# Patient Record
Sex: Female | Born: 1950 | Race: Black or African American | Hispanic: No | Marital: Married | Smoking: Former smoker
Health system: Southern US, Community
[De-identification: ages and names within clinical notes are randomized; demographics above are authoritative.]

## PROBLEM LIST (undated history)

## (undated) DIAGNOSIS — M797 Fibromyalgia: Secondary | ICD-10-CM

## (undated) DIAGNOSIS — E785 Hyperlipidemia, unspecified: Secondary | ICD-10-CM

## (undated) DIAGNOSIS — J449 Chronic obstructive pulmonary disease, unspecified: Secondary | ICD-10-CM

## (undated) DIAGNOSIS — F609 Personality disorder, unspecified: Secondary | ICD-10-CM

## (undated) DIAGNOSIS — Z86010 Personal history of colonic polyps: Secondary | ICD-10-CM

## (undated) DIAGNOSIS — M359 Systemic involvement of connective tissue, unspecified: Secondary | ICD-10-CM

## (undated) DIAGNOSIS — F445 Conversion disorder with seizures or convulsions: Secondary | ICD-10-CM

## (undated) DIAGNOSIS — M069 Rheumatoid arthritis, unspecified: Secondary | ICD-10-CM

## (undated) DIAGNOSIS — G8929 Other chronic pain: Secondary | ICD-10-CM

## (undated) DIAGNOSIS — J45909 Unspecified asthma, uncomplicated: Secondary | ICD-10-CM

## (undated) DIAGNOSIS — I10 Essential (primary) hypertension: Secondary | ICD-10-CM

## (undated) DIAGNOSIS — F32A Depression, unspecified: Secondary | ICD-10-CM

## (undated) DIAGNOSIS — I639 Cerebral infarction, unspecified: Secondary | ICD-10-CM

## (undated) DIAGNOSIS — M545 Low back pain, unspecified: Secondary | ICD-10-CM

## (undated) DIAGNOSIS — G4730 Sleep apnea, unspecified: Secondary | ICD-10-CM

## (undated) DIAGNOSIS — R569 Unspecified convulsions: Secondary | ICD-10-CM

## (undated) DIAGNOSIS — Z8673 Personal history of transient ischemic attack (TIA), and cerebral infarction without residual deficits: Secondary | ICD-10-CM

## (undated) DIAGNOSIS — F329 Major depressive disorder, single episode, unspecified: Secondary | ICD-10-CM

## (undated) DIAGNOSIS — K219 Gastro-esophageal reflux disease without esophagitis: Secondary | ICD-10-CM

## (undated) HISTORY — DX: Sleep apnea, unspecified: G47.30

## (undated) HISTORY — DX: Essential (primary) hypertension: I10

## (undated) HISTORY — PX: APPENDECTOMY: SHX54

## (undated) HISTORY — DX: Other chronic pain: G89.29

## (undated) HISTORY — DX: Conversion disorder with seizures or convulsions: F44.5

## (undated) HISTORY — PX: NOSE SURGERY: SHX723

## (undated) HISTORY — DX: Unspecified convulsions: R56.9

## (undated) HISTORY — DX: Depression, unspecified: F32.A

## (undated) HISTORY — PX: CHOLECYSTECTOMY: SHX55

## (undated) HISTORY — DX: Major depressive disorder, single episode, unspecified: F32.9

## (undated) HISTORY — DX: Fibromyalgia: M79.7

## (undated) HISTORY — PX: FLANK MASS EXCISION: SUR536

## (undated) HISTORY — DX: Low back pain, unspecified: M54.50

## (undated) HISTORY — DX: Chronic obstructive pulmonary disease, unspecified: J44.9

## (undated) HISTORY — DX: Low back pain: M54.5

## (undated) HISTORY — DX: Personal history of transient ischemic attack (TIA), and cerebral infarction without residual deficits: Z86.73

## (undated) HISTORY — PX: BUNIONECTOMY: SHX129

## (undated) HISTORY — PX: TOTAL ABDOMINAL HYSTERECTOMY: SHX209

## (undated) HISTORY — DX: Gastro-esophageal reflux disease without esophagitis: K21.9

## (undated) HISTORY — DX: Rheumatoid arthritis, unspecified: M06.9

## (undated) HISTORY — PX: CYST EXCISION: SHX5701

## (undated) HISTORY — DX: Personal history of colonic polyps: Z86.010

## (undated) HISTORY — PX: BACK SURGERY: SHX140

## (undated) HISTORY — DX: Personality disorder, unspecified: F60.9

## (undated) HISTORY — PX: ANKLE SURGERY: SHX546

## (undated) HISTORY — PX: SHOULDER SURGERY: SHX246

## (undated) HISTORY — PX: KNEE SURGERY: SHX244

## (undated) HISTORY — DX: Hyperlipidemia, unspecified: E78.5

---

## 1998-01-27 ENCOUNTER — Ambulatory Visit (HOSPITAL_COMMUNITY): Admission: RE | Admit: 1998-01-27 | Discharge: 1998-01-27 | Payer: Self-pay | Admitting: *Deleted

## 1998-04-17 ENCOUNTER — Emergency Department (HOSPITAL_COMMUNITY): Admission: EM | Admit: 1998-04-17 | Discharge: 1998-04-17 | Payer: Self-pay | Admitting: Emergency Medicine

## 1998-05-17 ENCOUNTER — Encounter: Admission: RE | Admit: 1998-05-17 | Discharge: 1998-05-17 | Payer: Self-pay | Admitting: Sports Medicine

## 1998-05-23 ENCOUNTER — Ambulatory Visit: Admission: RE | Admit: 1998-05-23 | Discharge: 1998-05-23 | Payer: Self-pay | Admitting: *Deleted

## 1998-07-20 ENCOUNTER — Emergency Department (HOSPITAL_COMMUNITY): Admission: EM | Admit: 1998-07-20 | Discharge: 1998-07-20 | Payer: Self-pay | Admitting: Emergency Medicine

## 1998-07-26 ENCOUNTER — Encounter: Admission: RE | Admit: 1998-07-26 | Discharge: 1998-07-26 | Payer: Self-pay | Admitting: Family Medicine

## 1998-07-27 ENCOUNTER — Encounter: Admission: RE | Admit: 1998-07-27 | Discharge: 1998-07-27 | Payer: Self-pay | Admitting: Family Medicine

## 1998-07-27 ENCOUNTER — Other Ambulatory Visit: Admission: RE | Admit: 1998-07-27 | Discharge: 1998-07-27 | Payer: Self-pay | Admitting: *Deleted

## 1998-10-10 ENCOUNTER — Encounter: Payer: Self-pay | Admitting: Neurosurgery

## 1998-10-10 ENCOUNTER — Ambulatory Visit (HOSPITAL_COMMUNITY): Admission: RE | Admit: 1998-10-10 | Discharge: 1998-10-10 | Payer: Self-pay | Admitting: Neurosurgery

## 1998-12-12 ENCOUNTER — Ambulatory Visit (HOSPITAL_COMMUNITY): Admission: RE | Admit: 1998-12-12 | Discharge: 1998-12-12 | Payer: Self-pay | Admitting: Neurosurgery

## 1998-12-12 ENCOUNTER — Encounter: Payer: Self-pay | Admitting: Neurosurgery

## 1999-01-03 ENCOUNTER — Encounter: Admission: RE | Admit: 1999-01-03 | Discharge: 1999-01-03 | Payer: Self-pay | Admitting: Sports Medicine

## 1999-01-16 ENCOUNTER — Encounter: Payer: Self-pay | Admitting: Neurosurgery

## 1999-01-17 ENCOUNTER — Ambulatory Visit (HOSPITAL_COMMUNITY): Admission: RE | Admit: 1999-01-17 | Discharge: 1999-01-17 | Payer: Self-pay | Admitting: Neurosurgery

## 1999-01-17 ENCOUNTER — Encounter: Payer: Self-pay | Admitting: Neurosurgery

## 1999-01-18 ENCOUNTER — Encounter: Payer: Self-pay | Admitting: Neurosurgery

## 1999-01-18 ENCOUNTER — Inpatient Hospital Stay (HOSPITAL_COMMUNITY): Admission: RE | Admit: 1999-01-18 | Discharge: 1999-01-19 | Payer: Self-pay | Admitting: Neurosurgery

## 1999-01-24 ENCOUNTER — Inpatient Hospital Stay (HOSPITAL_COMMUNITY): Admission: EM | Admit: 1999-01-24 | Discharge: 1999-01-29 | Payer: Self-pay | Admitting: Emergency Medicine

## 1999-01-24 ENCOUNTER — Encounter: Payer: Self-pay | Admitting: Emergency Medicine

## 1999-02-22 ENCOUNTER — Encounter: Admission: RE | Admit: 1999-02-22 | Discharge: 1999-02-22 | Payer: Self-pay | Admitting: Family Medicine

## 1999-03-15 ENCOUNTER — Encounter: Admission: RE | Admit: 1999-03-15 | Discharge: 1999-03-15 | Payer: Self-pay | Admitting: Family Medicine

## 1999-04-28 ENCOUNTER — Ambulatory Visit (HOSPITAL_COMMUNITY): Admission: RE | Admit: 1999-04-28 | Discharge: 1999-04-28 | Payer: Self-pay | Admitting: Gastroenterology

## 1999-04-28 ENCOUNTER — Encounter: Payer: Self-pay | Admitting: Gastroenterology

## 1999-05-18 ENCOUNTER — Encounter: Payer: Self-pay | Admitting: Gastroenterology

## 1999-05-18 ENCOUNTER — Ambulatory Visit (HOSPITAL_COMMUNITY): Admission: RE | Admit: 1999-05-18 | Discharge: 1999-05-18 | Payer: Self-pay | Admitting: Gastroenterology

## 1999-05-25 ENCOUNTER — Encounter: Admission: RE | Admit: 1999-05-25 | Discharge: 1999-05-25 | Payer: Self-pay | Admitting: Family Medicine

## 1999-06-22 ENCOUNTER — Encounter: Admission: RE | Admit: 1999-06-22 | Discharge: 1999-06-22 | Payer: Self-pay | Admitting: Family Medicine

## 1999-07-03 ENCOUNTER — Ambulatory Visit (HOSPITAL_COMMUNITY): Admission: RE | Admit: 1999-07-03 | Discharge: 1999-07-03 | Payer: Self-pay | Admitting: Gastroenterology

## 1999-08-02 ENCOUNTER — Emergency Department (HOSPITAL_COMMUNITY): Admission: EM | Admit: 1999-08-02 | Discharge: 1999-08-02 | Payer: Self-pay | Admitting: Emergency Medicine

## 1999-08-02 ENCOUNTER — Encounter: Payer: Self-pay | Admitting: Emergency Medicine

## 1999-09-20 ENCOUNTER — Encounter: Admission: RE | Admit: 1999-09-20 | Discharge: 1999-09-20 | Payer: Self-pay | Admitting: Family Medicine

## 1999-10-04 ENCOUNTER — Encounter: Admission: RE | Admit: 1999-10-04 | Discharge: 1999-10-04 | Payer: Self-pay | Admitting: Family Medicine

## 1999-11-02 ENCOUNTER — Other Ambulatory Visit: Admission: RE | Admit: 1999-11-02 | Discharge: 1999-11-02 | Payer: Self-pay | Admitting: Family Medicine

## 1999-11-02 ENCOUNTER — Encounter: Admission: RE | Admit: 1999-11-02 | Discharge: 1999-11-02 | Payer: Self-pay | Admitting: Family Medicine

## 1999-11-06 ENCOUNTER — Encounter: Admission: RE | Admit: 1999-11-06 | Discharge: 1999-11-06 | Payer: Self-pay | Admitting: Family Medicine

## 1999-11-07 ENCOUNTER — Encounter: Admission: RE | Admit: 1999-11-07 | Discharge: 1999-11-07 | Payer: Self-pay | Admitting: Family Medicine

## 1999-11-07 ENCOUNTER — Encounter: Payer: Self-pay | Admitting: Family Medicine

## 1999-11-09 ENCOUNTER — Encounter: Admission: RE | Admit: 1999-11-09 | Discharge: 1999-11-09 | Payer: Self-pay | Admitting: Family Medicine

## 1999-11-09 ENCOUNTER — Encounter: Payer: Self-pay | Admitting: Family Medicine

## 1999-11-09 ENCOUNTER — Emergency Department (HOSPITAL_COMMUNITY): Admission: EM | Admit: 1999-11-09 | Discharge: 1999-11-09 | Payer: Self-pay | Admitting: Emergency Medicine

## 1999-11-09 ENCOUNTER — Ambulatory Visit (HOSPITAL_COMMUNITY): Admission: RE | Admit: 1999-11-09 | Discharge: 1999-11-09 | Payer: Self-pay | Admitting: Family Medicine

## 1999-11-14 ENCOUNTER — Encounter: Admission: RE | Admit: 1999-11-14 | Discharge: 1999-11-14 | Payer: Self-pay | Admitting: Sports Medicine

## 1999-11-21 ENCOUNTER — Ambulatory Visit (HOSPITAL_COMMUNITY): Admission: RE | Admit: 1999-11-21 | Discharge: 1999-11-21 | Payer: Self-pay

## 1999-11-30 ENCOUNTER — Encounter: Payer: Self-pay | Admitting: Gastroenterology

## 1999-11-30 ENCOUNTER — Ambulatory Visit (HOSPITAL_COMMUNITY): Admission: RE | Admit: 1999-11-30 | Discharge: 1999-11-30 | Payer: Self-pay | Admitting: Gastroenterology

## 2000-01-18 ENCOUNTER — Encounter: Payer: Self-pay | Admitting: Emergency Medicine

## 2000-01-18 ENCOUNTER — Emergency Department (HOSPITAL_COMMUNITY): Admission: EM | Admit: 2000-01-18 | Discharge: 2000-01-18 | Payer: Self-pay | Admitting: Emergency Medicine

## 2000-01-24 ENCOUNTER — Encounter: Admission: RE | Admit: 2000-01-24 | Discharge: 2000-01-24 | Payer: Self-pay | Admitting: Family Medicine

## 2000-05-29 ENCOUNTER — Encounter: Admission: RE | Admit: 2000-05-29 | Discharge: 2000-05-29 | Payer: Self-pay | Admitting: Family Medicine

## 2000-05-30 ENCOUNTER — Encounter: Admission: RE | Admit: 2000-05-30 | Discharge: 2000-05-30 | Payer: Self-pay | Admitting: Family Medicine

## 2000-08-20 ENCOUNTER — Encounter: Admission: RE | Admit: 2000-08-20 | Discharge: 2000-08-20 | Payer: Self-pay | Admitting: Family Medicine

## 2000-08-30 ENCOUNTER — Encounter: Admission: RE | Admit: 2000-08-30 | Discharge: 2000-08-30 | Payer: Self-pay | Admitting: Family Medicine

## 2000-09-08 ENCOUNTER — Emergency Department (HOSPITAL_COMMUNITY): Admission: EM | Admit: 2000-09-08 | Discharge: 2000-09-09 | Payer: Self-pay | Admitting: Emergency Medicine

## 2000-09-09 ENCOUNTER — Encounter: Payer: Self-pay | Admitting: Emergency Medicine

## 2000-09-23 ENCOUNTER — Encounter: Admission: RE | Admit: 2000-09-23 | Discharge: 2000-09-23 | Payer: Self-pay | Admitting: Family Medicine

## 2000-09-23 ENCOUNTER — Other Ambulatory Visit: Admission: RE | Admit: 2000-09-23 | Discharge: 2000-09-23 | Payer: Self-pay | Admitting: Family Medicine

## 2000-10-07 ENCOUNTER — Encounter: Admission: RE | Admit: 2000-10-07 | Discharge: 2000-10-07 | Payer: Self-pay | Admitting: Family Medicine

## 2001-03-10 ENCOUNTER — Encounter: Admission: RE | Admit: 2001-03-10 | Discharge: 2001-03-10 | Payer: Self-pay | Admitting: Family Medicine

## 2001-03-10 ENCOUNTER — Inpatient Hospital Stay (HOSPITAL_COMMUNITY): Admission: EM | Admit: 2001-03-10 | Discharge: 2001-03-12 | Payer: Self-pay | Admitting: Emergency Medicine

## 2001-03-11 ENCOUNTER — Encounter: Payer: Self-pay | Admitting: Family Medicine

## 2001-03-14 ENCOUNTER — Encounter: Admission: RE | Admit: 2001-03-14 | Discharge: 2001-03-14 | Payer: Self-pay | Admitting: Family Medicine

## 2001-03-25 ENCOUNTER — Encounter: Admission: RE | Admit: 2001-03-25 | Discharge: 2001-03-25 | Payer: Self-pay | Admitting: Family Medicine

## 2001-03-26 ENCOUNTER — Encounter: Admission: RE | Admit: 2001-03-26 | Discharge: 2001-03-26 | Payer: Self-pay | Admitting: Family Medicine

## 2001-03-27 ENCOUNTER — Encounter: Admission: RE | Admit: 2001-03-27 | Discharge: 2001-03-27 | Payer: Self-pay | Admitting: Family Medicine

## 2001-07-02 ENCOUNTER — Emergency Department (HOSPITAL_COMMUNITY): Admission: EM | Admit: 2001-07-02 | Discharge: 2001-07-02 | Payer: Self-pay | Admitting: Emergency Medicine

## 2001-07-10 ENCOUNTER — Encounter: Payer: Self-pay | Admitting: Family Medicine

## 2001-07-10 ENCOUNTER — Encounter: Admission: RE | Admit: 2001-07-10 | Discharge: 2001-07-10 | Payer: Self-pay | Admitting: Family Medicine

## 2001-07-23 ENCOUNTER — Encounter: Admission: RE | Admit: 2001-07-23 | Discharge: 2001-07-23 | Payer: Self-pay | Admitting: Family Medicine

## 2001-08-01 ENCOUNTER — Encounter: Admission: RE | Admit: 2001-08-01 | Discharge: 2001-08-01 | Payer: Self-pay | Admitting: Sports Medicine

## 2001-08-01 ENCOUNTER — Encounter: Admission: RE | Admit: 2001-08-01 | Discharge: 2001-08-01 | Payer: Self-pay | Admitting: Family Medicine

## 2001-08-01 ENCOUNTER — Encounter: Payer: Self-pay | Admitting: Sports Medicine

## 2001-08-06 ENCOUNTER — Encounter: Admission: RE | Admit: 2001-08-06 | Discharge: 2001-08-06 | Payer: Self-pay | Admitting: Family Medicine

## 2001-08-21 ENCOUNTER — Encounter: Payer: Self-pay | Admitting: Family Medicine

## 2001-08-21 ENCOUNTER — Ambulatory Visit (HOSPITAL_COMMUNITY): Admission: RE | Admit: 2001-08-21 | Discharge: 2001-08-21 | Payer: Self-pay | Admitting: Family Medicine

## 2001-08-21 ENCOUNTER — Encounter: Admission: RE | Admit: 2001-08-21 | Discharge: 2001-08-21 | Payer: Self-pay | Admitting: Family Medicine

## 2001-11-26 ENCOUNTER — Encounter: Admission: RE | Admit: 2001-11-26 | Discharge: 2001-11-26 | Payer: Self-pay | Admitting: Family Medicine

## 2001-12-01 ENCOUNTER — Encounter: Admission: RE | Admit: 2001-12-01 | Discharge: 2001-12-01 | Payer: Self-pay | Admitting: Sports Medicine

## 2001-12-01 ENCOUNTER — Encounter: Payer: Self-pay | Admitting: Sports Medicine

## 2001-12-17 ENCOUNTER — Encounter: Admission: RE | Admit: 2001-12-17 | Discharge: 2001-12-17 | Payer: Self-pay | Admitting: Family Medicine

## 2001-12-17 ENCOUNTER — Other Ambulatory Visit: Admission: RE | Admit: 2001-12-17 | Discharge: 2001-12-17 | Payer: Self-pay | Admitting: Family Medicine

## 2001-12-20 ENCOUNTER — Encounter (INDEPENDENT_AMBULATORY_CARE_PROVIDER_SITE_OTHER): Payer: Self-pay | Admitting: *Deleted

## 2001-12-20 LAB — CONVERTED CEMR LAB

## 2001-12-29 ENCOUNTER — Encounter: Payer: Self-pay | Admitting: Emergency Medicine

## 2001-12-29 ENCOUNTER — Emergency Department (HOSPITAL_COMMUNITY): Admission: EM | Admit: 2001-12-29 | Discharge: 2001-12-29 | Payer: Self-pay | Admitting: Emergency Medicine

## 2002-01-08 ENCOUNTER — Encounter: Admission: RE | Admit: 2002-01-08 | Discharge: 2002-01-08 | Payer: Self-pay | Admitting: Family Medicine

## 2002-01-08 ENCOUNTER — Encounter: Payer: Self-pay | Admitting: Family Medicine

## 2002-01-08 ENCOUNTER — Ambulatory Visit: Admission: RE | Admit: 2002-01-08 | Discharge: 2002-01-08 | Payer: Self-pay | Admitting: Family Medicine

## 2002-01-12 ENCOUNTER — Encounter: Admission: RE | Admit: 2002-01-12 | Discharge: 2002-01-12 | Payer: Self-pay | Admitting: Sports Medicine

## 2002-02-03 ENCOUNTER — Encounter: Admission: RE | Admit: 2002-02-03 | Discharge: 2002-02-03 | Payer: Self-pay | Admitting: Family Medicine

## 2002-02-26 ENCOUNTER — Ambulatory Visit (HOSPITAL_COMMUNITY): Admission: RE | Admit: 2002-02-26 | Discharge: 2002-02-26 | Payer: Self-pay | Admitting: Gastroenterology

## 2002-02-27 ENCOUNTER — Encounter: Payer: Self-pay | Admitting: Gastroenterology

## 2002-02-27 ENCOUNTER — Ambulatory Visit (HOSPITAL_COMMUNITY): Admission: RE | Admit: 2002-02-27 | Discharge: 2002-02-27 | Payer: Self-pay | Admitting: Gastroenterology

## 2002-03-05 ENCOUNTER — Encounter: Admission: RE | Admit: 2002-03-05 | Discharge: 2002-03-05 | Payer: Self-pay | Admitting: Family Medicine

## 2002-04-09 ENCOUNTER — Encounter: Payer: Self-pay | Admitting: Emergency Medicine

## 2002-04-09 ENCOUNTER — Emergency Department (HOSPITAL_COMMUNITY): Admission: EM | Admit: 2002-04-09 | Discharge: 2002-04-09 | Payer: Self-pay | Admitting: Emergency Medicine

## 2002-05-12 ENCOUNTER — Encounter: Payer: Self-pay | Admitting: Emergency Medicine

## 2002-05-12 ENCOUNTER — Emergency Department (HOSPITAL_COMMUNITY): Admission: EM | Admit: 2002-05-12 | Discharge: 2002-05-12 | Payer: Self-pay | Admitting: Emergency Medicine

## 2002-08-18 ENCOUNTER — Encounter: Admission: RE | Admit: 2002-08-18 | Discharge: 2002-08-18 | Payer: Self-pay | Admitting: Family Medicine

## 2002-08-28 ENCOUNTER — Encounter: Payer: Self-pay | Admitting: Family Medicine

## 2002-08-28 ENCOUNTER — Ambulatory Visit (HOSPITAL_COMMUNITY): Admission: RE | Admit: 2002-08-28 | Discharge: 2002-08-28 | Payer: Self-pay | Admitting: Family Medicine

## 2002-08-31 ENCOUNTER — Encounter: Admission: RE | Admit: 2002-08-31 | Discharge: 2002-08-31 | Payer: Self-pay | Admitting: Family Medicine

## 2002-09-01 ENCOUNTER — Ambulatory Visit (HOSPITAL_COMMUNITY): Admission: RE | Admit: 2002-09-01 | Discharge: 2002-09-01 | Payer: Self-pay | Admitting: Family Medicine

## 2002-09-01 ENCOUNTER — Encounter: Admission: RE | Admit: 2002-09-01 | Discharge: 2002-09-01 | Payer: Self-pay | Admitting: Family Medicine

## 2002-09-22 ENCOUNTER — Encounter: Payer: Self-pay | Admitting: Emergency Medicine

## 2002-09-22 ENCOUNTER — Emergency Department (HOSPITAL_COMMUNITY): Admission: EM | Admit: 2002-09-22 | Discharge: 2002-09-22 | Payer: Self-pay | Admitting: Emergency Medicine

## 2002-11-07 ENCOUNTER — Encounter: Payer: Self-pay | Admitting: Emergency Medicine

## 2002-11-07 ENCOUNTER — Emergency Department (HOSPITAL_COMMUNITY): Admission: EM | Admit: 2002-11-07 | Discharge: 2002-11-07 | Payer: Self-pay | Admitting: Emergency Medicine

## 2002-11-24 ENCOUNTER — Emergency Department (HOSPITAL_COMMUNITY): Admission: EM | Admit: 2002-11-24 | Discharge: 2002-11-24 | Payer: Self-pay | Admitting: Emergency Medicine

## 2002-11-24 ENCOUNTER — Encounter: Payer: Self-pay | Admitting: Emergency Medicine

## 2002-12-23 ENCOUNTER — Encounter: Admission: RE | Admit: 2002-12-23 | Discharge: 2002-12-23 | Payer: Self-pay | Admitting: Family Medicine

## 2002-12-29 ENCOUNTER — Encounter: Admission: RE | Admit: 2002-12-29 | Discharge: 2002-12-29 | Payer: Self-pay | Admitting: Sports Medicine

## 2002-12-29 ENCOUNTER — Encounter: Payer: Self-pay | Admitting: Sports Medicine

## 2002-12-31 ENCOUNTER — Encounter: Admission: RE | Admit: 2002-12-31 | Discharge: 2002-12-31 | Payer: Self-pay | Admitting: Family Medicine

## 2003-01-12 ENCOUNTER — Encounter: Payer: Self-pay | Admitting: *Deleted

## 2003-01-12 ENCOUNTER — Emergency Department (HOSPITAL_COMMUNITY): Admission: EM | Admit: 2003-01-12 | Discharge: 2003-01-12 | Payer: Self-pay | Admitting: Emergency Medicine

## 2003-01-14 ENCOUNTER — Encounter: Payer: Self-pay | Admitting: Gastroenterology

## 2003-01-14 ENCOUNTER — Ambulatory Visit (HOSPITAL_COMMUNITY): Admission: RE | Admit: 2003-01-14 | Discharge: 2003-01-14 | Payer: Self-pay | Admitting: Gastroenterology

## 2003-01-19 ENCOUNTER — Ambulatory Visit (HOSPITAL_COMMUNITY): Admission: RE | Admit: 2003-01-19 | Discharge: 2003-01-19 | Payer: Self-pay | Admitting: Gastroenterology

## 2003-01-20 ENCOUNTER — Ambulatory Visit (HOSPITAL_COMMUNITY): Admission: RE | Admit: 2003-01-20 | Discharge: 2003-01-20 | Payer: Self-pay | Admitting: Gastroenterology

## 2003-01-20 ENCOUNTER — Encounter: Payer: Self-pay | Admitting: Gastroenterology

## 2003-01-22 ENCOUNTER — Encounter (INDEPENDENT_AMBULATORY_CARE_PROVIDER_SITE_OTHER): Payer: Self-pay | Admitting: *Deleted

## 2003-01-22 ENCOUNTER — Encounter: Payer: Self-pay | Admitting: Surgery

## 2003-01-23 ENCOUNTER — Inpatient Hospital Stay (HOSPITAL_COMMUNITY): Admission: AD | Admit: 2003-01-23 | Discharge: 2003-01-25 | Payer: Self-pay | Admitting: Surgery

## 2003-01-24 ENCOUNTER — Encounter: Payer: Self-pay | Admitting: Surgery

## 2003-02-04 ENCOUNTER — Ambulatory Visit (HOSPITAL_COMMUNITY): Admission: RE | Admit: 2003-02-04 | Discharge: 2003-02-04 | Payer: Self-pay | Admitting: Surgery

## 2003-02-04 ENCOUNTER — Encounter: Payer: Self-pay | Admitting: Surgery

## 2003-02-11 ENCOUNTER — Ambulatory Visit (HOSPITAL_COMMUNITY): Admission: RE | Admit: 2003-02-11 | Discharge: 2003-02-11 | Payer: Self-pay | Admitting: Gastroenterology

## 2003-02-11 ENCOUNTER — Encounter: Payer: Self-pay | Admitting: Gastroenterology

## 2003-04-06 ENCOUNTER — Encounter: Admission: RE | Admit: 2003-04-06 | Discharge: 2003-04-06 | Payer: Self-pay | Admitting: Sports Medicine

## 2003-04-29 ENCOUNTER — Encounter: Admission: RE | Admit: 2003-04-29 | Discharge: 2003-04-29 | Payer: Self-pay | Admitting: Sports Medicine

## 2003-05-05 ENCOUNTER — Emergency Department (HOSPITAL_COMMUNITY): Admission: EM | Admit: 2003-05-05 | Discharge: 2003-05-05 | Payer: Self-pay | Admitting: Emergency Medicine

## 2003-05-07 ENCOUNTER — Encounter: Admission: RE | Admit: 2003-05-07 | Discharge: 2003-05-07 | Payer: Self-pay | Admitting: Family Medicine

## 2003-05-27 ENCOUNTER — Emergency Department (HOSPITAL_COMMUNITY): Admission: EM | Admit: 2003-05-27 | Discharge: 2003-05-27 | Payer: Self-pay | Admitting: Emergency Medicine

## 2003-05-27 ENCOUNTER — Encounter: Payer: Self-pay | Admitting: Emergency Medicine

## 2003-06-02 ENCOUNTER — Encounter: Admission: RE | Admit: 2003-06-02 | Discharge: 2003-06-02 | Payer: Self-pay | Admitting: Family Medicine

## 2003-06-02 ENCOUNTER — Ambulatory Visit (HOSPITAL_COMMUNITY): Admission: RE | Admit: 2003-06-02 | Discharge: 2003-06-02 | Payer: Self-pay | Admitting: Family Medicine

## 2003-06-09 ENCOUNTER — Encounter: Payer: Self-pay | Admitting: Cardiovascular Disease

## 2003-06-09 ENCOUNTER — Ambulatory Visit (HOSPITAL_COMMUNITY): Admission: RE | Admit: 2003-06-09 | Discharge: 2003-06-09 | Payer: Self-pay | Admitting: Cardiovascular Disease

## 2003-06-29 ENCOUNTER — Emergency Department (HOSPITAL_COMMUNITY): Admission: EM | Admit: 2003-06-29 | Discharge: 2003-06-30 | Payer: Self-pay

## 2003-06-30 ENCOUNTER — Emergency Department (HOSPITAL_COMMUNITY): Admission: EM | Admit: 2003-06-30 | Discharge: 2003-06-30 | Payer: Self-pay | Admitting: Emergency Medicine

## 2003-07-01 ENCOUNTER — Encounter: Admission: RE | Admit: 2003-07-01 | Discharge: 2003-07-01 | Payer: Self-pay | Admitting: Family Medicine

## 2003-07-01 ENCOUNTER — Encounter: Payer: Self-pay | Admitting: Family Medicine

## 2003-07-01 ENCOUNTER — Inpatient Hospital Stay (HOSPITAL_COMMUNITY): Admission: AD | Admit: 2003-07-01 | Discharge: 2003-07-05 | Payer: Self-pay | Admitting: Family Medicine

## 2003-07-23 ENCOUNTER — Encounter: Admission: RE | Admit: 2003-07-23 | Discharge: 2003-07-23 | Payer: Self-pay | Admitting: Family Medicine

## 2003-07-30 ENCOUNTER — Encounter: Admission: RE | Admit: 2003-07-30 | Discharge: 2003-07-30 | Payer: Self-pay | Admitting: Family Medicine

## 2003-08-09 ENCOUNTER — Encounter: Admission: RE | Admit: 2003-08-09 | Discharge: 2003-08-09 | Payer: Self-pay | Admitting: Family Medicine

## 2003-08-31 ENCOUNTER — Encounter: Admission: RE | Admit: 2003-08-31 | Discharge: 2003-08-31 | Payer: Self-pay | Admitting: Sports Medicine

## 2003-09-02 ENCOUNTER — Encounter: Admission: RE | Admit: 2003-09-02 | Discharge: 2003-09-02 | Payer: Self-pay | Admitting: Sports Medicine

## 2003-09-22 ENCOUNTER — Ambulatory Visit (HOSPITAL_COMMUNITY): Admission: RE | Admit: 2003-09-22 | Discharge: 2003-09-22 | Payer: Self-pay | Admitting: Family Medicine

## 2003-09-22 ENCOUNTER — Encounter: Admission: RE | Admit: 2003-09-22 | Discharge: 2003-09-22 | Payer: Self-pay | Admitting: Family Medicine

## 2003-11-02 ENCOUNTER — Encounter: Admission: RE | Admit: 2003-11-02 | Discharge: 2003-11-02 | Payer: Self-pay | Admitting: Sports Medicine

## 2003-11-02 ENCOUNTER — Emergency Department (HOSPITAL_COMMUNITY): Admission: EM | Admit: 2003-11-02 | Discharge: 2003-11-02 | Payer: Self-pay | Admitting: Emergency Medicine

## 2003-11-03 ENCOUNTER — Encounter: Admission: RE | Admit: 2003-11-03 | Discharge: 2003-11-03 | Payer: Self-pay | Admitting: Family Medicine

## 2003-11-10 ENCOUNTER — Emergency Department (HOSPITAL_COMMUNITY): Admission: EM | Admit: 2003-11-10 | Discharge: 2003-11-10 | Payer: Self-pay | Admitting: Emergency Medicine

## 2003-11-12 ENCOUNTER — Ambulatory Visit (HOSPITAL_COMMUNITY): Admission: RE | Admit: 2003-11-12 | Discharge: 2003-11-12 | Payer: Self-pay | Admitting: Family Medicine

## 2003-11-25 ENCOUNTER — Encounter: Admission: RE | Admit: 2003-11-25 | Discharge: 2003-11-25 | Payer: Self-pay | Admitting: Sports Medicine

## 2003-12-09 ENCOUNTER — Emergency Department (HOSPITAL_COMMUNITY): Admission: EM | Admit: 2003-12-09 | Discharge: 2003-12-09 | Payer: Self-pay | Admitting: Emergency Medicine

## 2003-12-17 ENCOUNTER — Encounter: Admission: RE | Admit: 2003-12-17 | Discharge: 2003-12-17 | Payer: Self-pay | Admitting: Family Medicine

## 2004-01-01 ENCOUNTER — Emergency Department (HOSPITAL_COMMUNITY): Admission: EM | Admit: 2004-01-01 | Discharge: 2004-01-01 | Payer: Self-pay | Admitting: Emergency Medicine

## 2004-01-17 ENCOUNTER — Encounter: Admission: RE | Admit: 2004-01-17 | Discharge: 2004-01-17 | Payer: Self-pay | Admitting: Family Medicine

## 2004-01-31 ENCOUNTER — Emergency Department (HOSPITAL_COMMUNITY): Admission: EM | Admit: 2004-01-31 | Discharge: 2004-01-31 | Payer: Self-pay | Admitting: Emergency Medicine

## 2004-02-01 ENCOUNTER — Encounter: Admission: RE | Admit: 2004-02-01 | Discharge: 2004-02-01 | Payer: Self-pay | Admitting: Sports Medicine

## 2004-02-02 ENCOUNTER — Emergency Department (HOSPITAL_COMMUNITY): Admission: AD | Admit: 2004-02-02 | Discharge: 2004-02-02 | Payer: Self-pay | Admitting: Emergency Medicine

## 2004-02-04 ENCOUNTER — Emergency Department (HOSPITAL_COMMUNITY): Admission: EM | Admit: 2004-02-04 | Discharge: 2004-02-04 | Payer: Self-pay | Admitting: Emergency Medicine

## 2004-02-17 ENCOUNTER — Encounter: Admission: RE | Admit: 2004-02-17 | Discharge: 2004-02-17 | Payer: Self-pay | Admitting: Family Medicine

## 2004-03-09 ENCOUNTER — Encounter: Admission: RE | Admit: 2004-03-09 | Discharge: 2004-03-09 | Payer: Self-pay | Admitting: Sports Medicine

## 2004-05-01 ENCOUNTER — Emergency Department (HOSPITAL_COMMUNITY): Admission: EM | Admit: 2004-05-01 | Discharge: 2004-05-01 | Payer: Self-pay | Admitting: Emergency Medicine

## 2004-05-08 ENCOUNTER — Encounter: Admission: RE | Admit: 2004-05-08 | Discharge: 2004-05-08 | Payer: Self-pay | Admitting: Family Medicine

## 2004-05-15 ENCOUNTER — Encounter: Admission: RE | Admit: 2004-05-15 | Discharge: 2004-05-15 | Payer: Self-pay | Admitting: Family Medicine

## 2004-05-22 ENCOUNTER — Encounter: Admission: RE | Admit: 2004-05-22 | Discharge: 2004-05-22 | Payer: Self-pay | Admitting: Family Medicine

## 2004-06-07 ENCOUNTER — Encounter: Admission: RE | Admit: 2004-06-07 | Discharge: 2004-06-07 | Payer: Self-pay | Admitting: Family Medicine

## 2004-09-01 ENCOUNTER — Ambulatory Visit: Payer: Self-pay | Admitting: Family Medicine

## 2004-10-04 ENCOUNTER — Emergency Department (HOSPITAL_COMMUNITY): Admission: EM | Admit: 2004-10-04 | Discharge: 2004-10-04 | Payer: Self-pay | Admitting: Family Medicine

## 2004-10-31 ENCOUNTER — Ambulatory Visit: Payer: Self-pay | Admitting: Family Medicine

## 2004-11-01 ENCOUNTER — Ambulatory Visit: Payer: Self-pay | Admitting: Family Medicine

## 2004-11-03 ENCOUNTER — Encounter: Admission: RE | Admit: 2004-11-03 | Discharge: 2005-02-01 | Payer: Self-pay | Admitting: Sports Medicine

## 2004-11-09 ENCOUNTER — Emergency Department (HOSPITAL_COMMUNITY): Admission: EM | Admit: 2004-11-09 | Discharge: 2004-11-09 | Payer: Self-pay | Admitting: Family Medicine

## 2004-12-05 ENCOUNTER — Ambulatory Visit: Payer: Self-pay | Admitting: Family Medicine

## 2004-12-22 ENCOUNTER — Ambulatory Visit: Payer: Self-pay | Admitting: Family Medicine

## 2005-01-03 ENCOUNTER — Ambulatory Visit: Payer: Self-pay | Admitting: Family Medicine

## 2005-01-03 ENCOUNTER — Encounter: Admission: RE | Admit: 2005-01-03 | Discharge: 2005-01-03 | Payer: Self-pay | Admitting: Vascular Surgery

## 2005-01-09 ENCOUNTER — Ambulatory Visit: Payer: Self-pay | Admitting: Sports Medicine

## 2005-01-25 ENCOUNTER — Ambulatory Visit: Payer: Self-pay | Admitting: Family Medicine

## 2005-02-22 ENCOUNTER — Emergency Department (HOSPITAL_COMMUNITY): Admission: EM | Admit: 2005-02-22 | Discharge: 2005-02-22 | Payer: Self-pay | Admitting: Family Medicine

## 2005-03-01 ENCOUNTER — Ambulatory Visit: Payer: Self-pay | Admitting: Family Medicine

## 2005-03-12 ENCOUNTER — Ambulatory Visit: Payer: Self-pay | Admitting: Family Medicine

## 2005-05-05 ENCOUNTER — Emergency Department (HOSPITAL_COMMUNITY): Admission: EM | Admit: 2005-05-05 | Discharge: 2005-05-05 | Payer: Self-pay | Admitting: Family Medicine

## 2005-07-18 ENCOUNTER — Emergency Department (HOSPITAL_COMMUNITY): Admission: EM | Admit: 2005-07-18 | Discharge: 2005-07-18 | Payer: Self-pay | Admitting: Emergency Medicine

## 2005-07-20 ENCOUNTER — Ambulatory Visit: Payer: Self-pay | Admitting: Family Medicine

## 2005-09-04 ENCOUNTER — Ambulatory Visit: Payer: Self-pay | Admitting: Family Medicine

## 2005-09-05 ENCOUNTER — Ambulatory Visit: Payer: Self-pay | Admitting: Family Medicine

## 2005-09-19 ENCOUNTER — Emergency Department (HOSPITAL_COMMUNITY): Admission: EM | Admit: 2005-09-19 | Discharge: 2005-09-19 | Payer: Self-pay | Admitting: Family Medicine

## 2005-11-06 ENCOUNTER — Ambulatory Visit: Payer: Self-pay | Admitting: Family Medicine

## 2005-11-14 ENCOUNTER — Ambulatory Visit: Payer: Self-pay | Admitting: Family Medicine

## 2005-11-21 ENCOUNTER — Ambulatory Visit (HOSPITAL_BASED_OUTPATIENT_CLINIC_OR_DEPARTMENT_OTHER): Admission: RE | Admit: 2005-11-21 | Discharge: 2005-11-21 | Payer: Self-pay | Admitting: Family Medicine

## 2005-11-25 ENCOUNTER — Ambulatory Visit: Payer: Self-pay | Admitting: Internal Medicine

## 2005-11-28 ENCOUNTER — Ambulatory Visit: Payer: Self-pay | Admitting: Family Medicine

## 2005-12-19 ENCOUNTER — Ambulatory Visit: Payer: Self-pay | Admitting: Family Medicine

## 2006-01-24 ENCOUNTER — Emergency Department (HOSPITAL_COMMUNITY): Admission: EM | Admit: 2006-01-24 | Discharge: 2006-01-24 | Payer: Self-pay | Admitting: Family Medicine

## 2006-01-24 ENCOUNTER — Encounter: Payer: Self-pay | Admitting: Emergency Medicine

## 2006-01-26 ENCOUNTER — Emergency Department (HOSPITAL_COMMUNITY): Admission: EM | Admit: 2006-01-26 | Discharge: 2006-01-26 | Payer: Self-pay | Admitting: Family Medicine

## 2006-03-27 ENCOUNTER — Ambulatory Visit: Payer: Self-pay | Admitting: Family Medicine

## 2006-03-27 ENCOUNTER — Encounter: Admission: RE | Admit: 2006-03-27 | Discharge: 2006-03-27 | Payer: Self-pay | Admitting: Sports Medicine

## 2006-06-14 ENCOUNTER — Ambulatory Visit: Payer: Self-pay | Admitting: Family Medicine

## 2006-06-19 ENCOUNTER — Ambulatory Visit: Payer: Self-pay | Admitting: Family Medicine

## 2006-07-19 ENCOUNTER — Ambulatory Visit (HOSPITAL_COMMUNITY): Admission: RE | Admit: 2006-07-19 | Discharge: 2006-07-19 | Payer: Self-pay | Admitting: Family Medicine

## 2006-07-19 ENCOUNTER — Ambulatory Visit: Payer: Self-pay | Admitting: Family Medicine

## 2006-07-30 ENCOUNTER — Ambulatory Visit (HOSPITAL_COMMUNITY): Admission: RE | Admit: 2006-07-30 | Discharge: 2006-07-30 | Payer: Self-pay | Admitting: Sports Medicine

## 2006-07-30 ENCOUNTER — Encounter (INDEPENDENT_AMBULATORY_CARE_PROVIDER_SITE_OTHER): Payer: Self-pay | Admitting: Family Medicine

## 2006-07-30 ENCOUNTER — Encounter (INDEPENDENT_AMBULATORY_CARE_PROVIDER_SITE_OTHER): Payer: Self-pay | Admitting: *Deleted

## 2006-08-14 ENCOUNTER — Ambulatory Visit: Payer: Self-pay | Admitting: Family Medicine

## 2006-10-22 HISTORY — PX: CARDIAC CATHETERIZATION: SHX172

## 2006-12-19 DIAGNOSIS — J4489 Other specified chronic obstructive pulmonary disease: Secondary | ICD-10-CM | POA: Insufficient documentation

## 2006-12-19 DIAGNOSIS — E785 Hyperlipidemia, unspecified: Secondary | ICD-10-CM | POA: Insufficient documentation

## 2006-12-19 DIAGNOSIS — F339 Major depressive disorder, recurrent, unspecified: Secondary | ICD-10-CM

## 2006-12-19 DIAGNOSIS — J449 Chronic obstructive pulmonary disease, unspecified: Secondary | ICD-10-CM | POA: Insufficient documentation

## 2006-12-19 DIAGNOSIS — K210 Gastro-esophageal reflux disease with esophagitis: Secondary | ICD-10-CM

## 2006-12-19 DIAGNOSIS — F449 Dissociative and conversion disorder, unspecified: Secondary | ICD-10-CM

## 2006-12-19 DIAGNOSIS — I10 Essential (primary) hypertension: Secondary | ICD-10-CM | POA: Insufficient documentation

## 2006-12-19 DIAGNOSIS — E669 Obesity, unspecified: Secondary | ICD-10-CM | POA: Insufficient documentation

## 2006-12-19 DIAGNOSIS — F410 Panic disorder [episodic paroxysmal anxiety] without agoraphobia: Secondary | ICD-10-CM

## 2006-12-19 DIAGNOSIS — F603 Borderline personality disorder: Secondary | ICD-10-CM

## 2006-12-19 DIAGNOSIS — F418 Other specified anxiety disorders: Secondary | ICD-10-CM | POA: Insufficient documentation

## 2006-12-19 HISTORY — DX: Dissociative and conversion disorder, unspecified: F44.9

## 2006-12-19 HISTORY — DX: Borderline personality disorder: F60.3

## 2006-12-20 ENCOUNTER — Encounter (INDEPENDENT_AMBULATORY_CARE_PROVIDER_SITE_OTHER): Payer: Self-pay | Admitting: *Deleted

## 2007-04-09 ENCOUNTER — Telehealth: Payer: Self-pay | Admitting: *Deleted

## 2007-05-28 ENCOUNTER — Encounter (INDEPENDENT_AMBULATORY_CARE_PROVIDER_SITE_OTHER): Payer: Self-pay | Admitting: Family Medicine

## 2007-06-24 ENCOUNTER — Ambulatory Visit: Payer: Self-pay | Admitting: Family Medicine

## 2007-06-24 ENCOUNTER — Encounter (INDEPENDENT_AMBULATORY_CARE_PROVIDER_SITE_OTHER): Payer: Self-pay | Admitting: Family Medicine

## 2007-06-24 DIAGNOSIS — F1320 Sedative, hypnotic or anxiolytic dependence, uncomplicated: Secondary | ICD-10-CM | POA: Insufficient documentation

## 2007-06-24 LAB — CONVERTED CEMR LAB
AST: 13 units/L (ref 0–37)
Alkaline Phosphatase: 100 units/L (ref 39–117)
Cholesterol, target level: 200 mg/dL
Cholesterol: 241 mg/dL — ABNORMAL HIGH (ref 0–200)
Glucose, Bld: 85 mg/dL (ref 70–99)
HDL goal, serum: 40 mg/dL
LDL Cholesterol: 157 mg/dL — ABNORMAL HIGH (ref 0–99)
LDL Goal: 100 mg/dL
Potassium: 4.3 meq/L (ref 3.5–5.3)
Sodium: 139 meq/L (ref 135–145)
Total CHOL/HDL Ratio: 3.9

## 2007-07-03 ENCOUNTER — Encounter (INDEPENDENT_AMBULATORY_CARE_PROVIDER_SITE_OTHER): Payer: Self-pay | Admitting: Family Medicine

## 2007-07-07 ENCOUNTER — Encounter (INDEPENDENT_AMBULATORY_CARE_PROVIDER_SITE_OTHER): Payer: Self-pay | Admitting: Family Medicine

## 2007-07-29 ENCOUNTER — Ambulatory Visit: Payer: Self-pay | Admitting: Family Medicine

## 2007-07-29 DIAGNOSIS — G43711 Chronic migraine without aura, intractable, with status migrainosus: Secondary | ICD-10-CM | POA: Insufficient documentation

## 2007-07-29 DIAGNOSIS — G43009 Migraine without aura, not intractable, without status migrainosus: Secondary | ICD-10-CM

## 2007-07-31 ENCOUNTER — Telehealth: Payer: Self-pay | Admitting: *Deleted

## 2007-08-19 ENCOUNTER — Encounter: Admission: RE | Admit: 2007-08-19 | Discharge: 2007-08-19 | Payer: Self-pay | Admitting: Sports Medicine

## 2007-09-02 ENCOUNTER — Encounter: Payer: Self-pay | Admitting: Family Medicine

## 2007-09-02 ENCOUNTER — Inpatient Hospital Stay (HOSPITAL_COMMUNITY): Admission: EM | Admit: 2007-09-02 | Discharge: 2007-09-04 | Payer: Self-pay | Admitting: Emergency Medicine

## 2007-09-02 ENCOUNTER — Ambulatory Visit: Payer: Self-pay | Admitting: Family Medicine

## 2007-09-04 ENCOUNTER — Encounter (INDEPENDENT_AMBULATORY_CARE_PROVIDER_SITE_OTHER): Payer: Self-pay | Admitting: Family Medicine

## 2007-09-05 ENCOUNTER — Encounter (INDEPENDENT_AMBULATORY_CARE_PROVIDER_SITE_OTHER): Payer: Self-pay | Admitting: *Deleted

## 2007-09-05 ENCOUNTER — Telehealth (INDEPENDENT_AMBULATORY_CARE_PROVIDER_SITE_OTHER): Payer: Self-pay | Admitting: Family Medicine

## 2007-09-09 ENCOUNTER — Encounter (INDEPENDENT_AMBULATORY_CARE_PROVIDER_SITE_OTHER): Payer: Self-pay | Admitting: Family Medicine

## 2007-09-09 ENCOUNTER — Ambulatory Visit: Payer: Self-pay | Admitting: Family Medicine

## 2007-10-02 ENCOUNTER — Encounter: Admission: RE | Admit: 2007-10-02 | Discharge: 2007-10-02 | Payer: Self-pay | Admitting: Family Medicine

## 2007-10-02 ENCOUNTER — Ambulatory Visit: Payer: Self-pay | Admitting: Family Medicine

## 2007-11-07 ENCOUNTER — Ambulatory Visit: Payer: Self-pay | Admitting: Family Medicine

## 2007-12-11 ENCOUNTER — Ambulatory Visit: Payer: Self-pay | Admitting: Family Medicine

## 2007-12-16 ENCOUNTER — Telehealth (INDEPENDENT_AMBULATORY_CARE_PROVIDER_SITE_OTHER): Payer: Self-pay | Admitting: Family Medicine

## 2007-12-18 ENCOUNTER — Telehealth (INDEPENDENT_AMBULATORY_CARE_PROVIDER_SITE_OTHER): Payer: Self-pay | Admitting: Family Medicine

## 2008-03-11 ENCOUNTER — Encounter (INDEPENDENT_AMBULATORY_CARE_PROVIDER_SITE_OTHER): Payer: Self-pay | Admitting: Family Medicine

## 2008-04-06 ENCOUNTER — Encounter (INDEPENDENT_AMBULATORY_CARE_PROVIDER_SITE_OTHER): Payer: Self-pay | Admitting: Family Medicine

## 2008-07-26 ENCOUNTER — Telehealth: Payer: Self-pay | Admitting: *Deleted

## 2008-07-29 ENCOUNTER — Encounter (INDEPENDENT_AMBULATORY_CARE_PROVIDER_SITE_OTHER): Payer: Self-pay | Admitting: Family Medicine

## 2008-07-29 ENCOUNTER — Ambulatory Visit: Payer: Self-pay | Admitting: Family Medicine

## 2008-07-29 DIAGNOSIS — R569 Unspecified convulsions: Secondary | ICD-10-CM | POA: Insufficient documentation

## 2008-07-29 DIAGNOSIS — I6350 Cerebral infarction due to unspecified occlusion or stenosis of unspecified cerebral artery: Secondary | ICD-10-CM | POA: Insufficient documentation

## 2008-07-29 LAB — CONVERTED CEMR LAB
HDL: 62 mg/dL (ref >39)
Total CHOL/HDL Ratio: 2.8
VLDL: 24 mg/dL (ref 0–40)

## 2008-07-30 ENCOUNTER — Encounter (INDEPENDENT_AMBULATORY_CARE_PROVIDER_SITE_OTHER): Payer: Self-pay | Admitting: Family Medicine

## 2008-08-26 ENCOUNTER — Telehealth: Payer: Self-pay | Admitting: *Deleted

## 2008-09-14 ENCOUNTER — Ambulatory Visit (HOSPITAL_COMMUNITY): Admission: RE | Admit: 2008-09-14 | Discharge: 2008-09-14 | Payer: Self-pay | Admitting: Family Medicine

## 2008-09-14 ENCOUNTER — Ambulatory Visit: Payer: Self-pay | Admitting: Family Medicine

## 2008-09-20 ENCOUNTER — Encounter (INDEPENDENT_AMBULATORY_CARE_PROVIDER_SITE_OTHER): Payer: Self-pay | Admitting: Family Medicine

## 2008-12-10 ENCOUNTER — Encounter: Admission: RE | Admit: 2008-12-10 | Discharge: 2008-12-10 | Payer: Self-pay | Admitting: Podiatrist

## 2008-12-27 ENCOUNTER — Ambulatory Visit: Payer: Self-pay | Admitting: Vascular Surgery

## 2009-04-18 ENCOUNTER — Encounter (INDEPENDENT_AMBULATORY_CARE_PROVIDER_SITE_OTHER): Payer: Self-pay | Admitting: Family Medicine

## 2009-07-21 ENCOUNTER — Encounter: Payer: Self-pay | Admitting: Family Medicine

## 2009-09-06 ENCOUNTER — Encounter: Admission: RE | Admit: 2009-09-06 | Discharge: 2009-10-05 | Payer: Self-pay | Admitting: Orthopedic Surgery

## 2009-10-31 ENCOUNTER — Telehealth: Payer: Self-pay | Admitting: Family Medicine

## 2009-11-07 ENCOUNTER — Ambulatory Visit: Payer: Self-pay | Admitting: Family Medicine

## 2009-11-14 ENCOUNTER — Ambulatory Visit: Payer: Self-pay | Admitting: Family Medicine

## 2009-11-14 ENCOUNTER — Encounter: Payer: Self-pay | Admitting: Family Medicine

## 2009-11-21 ENCOUNTER — Encounter: Payer: Self-pay | Admitting: Family Medicine

## 2009-11-21 LAB — CONVERTED CEMR LAB
ALT: 16 units/L (ref 0–35)
AST: 15 units/L (ref 0–37)
Albumin: 4 g/dL (ref 3.5–5.2)
Alkaline Phosphatase: 81 units/L (ref 39–117)
BUN: 11 mg/dL (ref 6–23)
CO2: 24 meq/L (ref 19–32)
Calcium: 8.7 mg/dL (ref 8.4–10.5)
Chloride: 105 meq/L (ref 96–112)
Cholesterol: 137 mg/dL (ref 0–200)
Creatinine, Ser: 0.68 mg/dL (ref 0.40–1.20)
Glucose, Bld: 135 mg/dL — ABNORMAL HIGH (ref 70–99)
HDL: 46 mg/dL (ref >39)
LDL Cholesterol: 74 mg/dL (ref 0–99)
Potassium: 4.3 meq/L (ref 3.5–5.3)
Sodium: 139 meq/L (ref 135–145)
Total Bilirubin: 0.2 mg/dL — ABNORMAL LOW (ref 0.3–1.2)
Total CHOL/HDL Ratio: 3
Total Protein: 6.7 g/dL (ref 6.0–8.3)
Triglycerides: 86 mg/dL (ref <150)
VLDL: 17 mg/dL (ref 0–40)
Vit D, 25-Hydroxy: <10 ng/mL — ABNORMAL LOW (ref 30–89)

## 2010-01-25 ENCOUNTER — Encounter: Payer: Self-pay | Admitting: Family Medicine

## 2010-03-30 ENCOUNTER — Ambulatory Visit: Payer: Self-pay | Admitting: Family Medicine

## 2010-09-04 ENCOUNTER — Telehealth: Payer: Self-pay | Admitting: *Deleted

## 2010-09-05 ENCOUNTER — Encounter: Admission: RE | Admit: 2010-09-05 | Discharge: 2010-09-05 | Payer: Self-pay | Admitting: Sports Medicine

## 2010-09-05 ENCOUNTER — Ambulatory Visit: Payer: Self-pay | Admitting: Family Medicine

## 2010-09-05 ENCOUNTER — Encounter: Payer: Self-pay | Admitting: Sports Medicine

## 2010-09-09 ENCOUNTER — Emergency Department (HOSPITAL_COMMUNITY): Admission: EM | Admit: 2010-09-09 | Discharge: 2010-09-09 | Payer: Self-pay | Admitting: Family Medicine

## 2010-09-27 ENCOUNTER — Ambulatory Visit: Payer: Self-pay | Admitting: Family Medicine

## 2010-10-02 ENCOUNTER — Telehealth: Payer: Self-pay | Admitting: Family Medicine

## 2010-11-11 ENCOUNTER — Encounter: Payer: Self-pay | Admitting: Family Medicine

## 2010-11-12 ENCOUNTER — Encounter: Payer: Self-pay | Admitting: Podiatrist

## 2010-11-12 ENCOUNTER — Encounter: Payer: Self-pay | Admitting: *Deleted

## 2010-11-21 NOTE — Progress Notes (Signed)
Summary: Rx Req  Phone Note Refill Request Call back at Home Phone [phone] Message from:  Patient  Refills Requested: Medication #1:  LIPITOR 80 MG  TABS One tablet daily for cholesterol control  Medication #2:  NORVASC 10 MG TABS 1 tablet daily for blood pressure PT USES [facility] HAS F/UP APPT ON 1/17.  Initial call taken by: Clydell Hakim,  October 31, 2009 10:55 AM  Follow-up for Phone Call        will forward to MD. Follow-up by: Theresia Lo RN,  October 31, 2009 11:56 AM    Prescriptions: LIPITOR 80 MG  TABS (ATORVASTATIN CALCIUM) One tablet daily for cholesterol control  #34 Tablet x 11   Entered and Authorized by:   Helane Rima DO   Signed by:   Helane Rima DO on 10/31/2009   Method used:   Electronically to        [facility] [id number] (retail)       [address] [place], [place]  [address]       Ph: [phone]       Fax: [phone]   RxID:   [id number] NORVASC 10 MG TABS (AMLODIPINE BESYLATE) 1 tablet daily for blood pressure  #34 x 1   Entered and Authorized by:   Helane Rima DO   Signed by:   Helane Rima DO on 10/31/2009   Method used:   Electronically to        [facility] [address] (retail)       [address]       [place], [place]  [address]       Ph: [phone]       Fax: [phone]   RxID:   [id number]

## 2010-11-21 NOTE — Letter (Signed)
Summary: Handout Printed  Printed Handout:  - Chronic Bronchitis and Emphysema Exacerbation 

## 2010-11-21 NOTE — Assessment & Plan Note (Signed)
Summary: leg problems,df   Vital Signs:  Patient profile:   60 year old female Height:      64.5 inches Weight:      207 pounds BMI:     35.11 Temp:     98.1 degrees F oral Pulse rate:   72 / minute BP sitting:   126 / 88  (right arm) Cuff size:   regular  Vitals Entered By: Tessie Fass CMA (March 30, 2010 10:14 AM) CC: bilateral leg pain  Is Patient Diabetic? No Pain Assessment Patient in pain? yes     Location: bilateral leg Intensity: 10   Primary Care Provider:  Helane Rima DO  CC:  bilateral leg pain .  History of Present Illness: 60 year old AAF:  1. Leg Pain:    - Left Leg: x 1 month, pain from left low back, down buttock, with radiation to knee, c/o that her whole thigh will get numb, walking/standing hurts, she has to stay seated. Previous low back surgery > 10 years ago. She doesn't rememer any details. "I may have taken steroids." Denies bowel/bladder incontinence, loss of strength in LE, numbness/tingling in hands/feet, foot drop.    - Left Foot: x 1 month, pain in heel - "I think it is a bone spur." Unable to walk on heel or extend foot. No Hx of Plantar Fasciitis.    - Right Knee: x 2 days, "my leg gave out and I fell." Denies hitting knee in fall. Complains of generalized knee pain, worse in front of knee, with popping, no redness, edema, warmth to knee. PMHx of bilateral arthroscopies. Denies fever/chills, lesions, rash.  Habits & Providers  Alcohol-Tobacco-Diet     Tobacco Status: current     Tobacco Counseling: to quit use of tobacco products     Cigarette Packs/Day: <0.25  Current Medications (verified): 1)  Norvasc 10 Mg Tabs (Amlodipine Besylate) .Marland Kitchen.. 1 Tablet Daily For Blood Pressure 2)  Phenobarbital 100 Mg  Tabs (Phenobarbital) .Marland Kitchen.. 1 Tablet Daily For Seizure Prevention 3)  Flonase 50 Mcg/act  Susp (Fluticasone Propionate) .... 2 Sprays in Each Nostril Each Day 4)  Simvastatin 80 Mg Tabs (Simvastatin) .Marland Kitchen.. 1 By Mouth At Bedtime 5)  Prilosec  Otc 20 Mg  Tbec (Omeprazole Magnesium) .... One Tablet Daily 6)  Coreg 3.125 Mg  Tabs (Carvedilol) .Marland Kitchen.. 1 Tablet Twice A Day For Blood Pressure and Headache Prevention 7)  Celexa 20 Mg  Tabs (Citalopram Hydrobromide) .... One Tablet At Bedtime 8)  Hydrocodone-Acetaminophen 5-325 Mg Tabs (Hydrocodone-Acetaminophen) .Marland Kitchen.. 1 By Mouth Up To 4 Times Per Day As Needed For Pain 9)  Aleve 220 Mg Tabs (Naproxen Sodium) .... One By Mouth Q 8 Hours As Needed For Pain and Inflammation  Allergies (verified): 1)  Amoxicillin (Amoxicillin) 2)  Aspirin (Aspirin) 3)  Codeine Phosphate (Codeine Phosphate) 4)  Prednisone (Prednisone) 5)  * Penicillins Group PMH-FH-SH reviewed for relevance  Social History: Packs/Day:  <0.25  Review of Systems      See HPI  Physical Exam  General:  Vitals reviewed. Sleeping upon entry to room. Once awake, started to complain of severe pain and continued to move, stating "I just can't get comfortable." Msk:  Attempted exam multiple times/multiple ways. Inspection: Knees symetrical. No edema, warmth, or redness. Patient c/o severe tender to palpation above and below patella. Refused rest of exam.  Left Hip/Leg: Neg SLR with distraction. TTP left lumbar paraspinal mm with point tederness at hip bursa. Normal strength. Normal reflexes left leg. Patient  refuse reflex and strength exam right leg. Patient ttp left heel - again, refused further exam. Pulses:  R and L dorsalis pedis and posterior tibial pulses are full and equal bilaterally. Neurologic:  Alert & oriented X3 and sensation intact to light touch.   Skin:  Intact without suspicious lesions or rashes.   Impression & Recommendations:  Problem # 1:  LEG PAIN, LEFT (ICD-729.5) Assessment New  No red flags. Unable to do proper exam. Rx pain control and advised patient to make prompt visit with her Orthopedic doctor.  Orders: [facility]- Est  Level 4 (04540)  Problem # 2:  KNEE PAIN, RIGHT (JWJ-191.47) Assessment:  New  See #1.  The following medications were removed from the medication list:    Diclofenac Sodium 50 Mg Tbec (Diclofenac sodium) .Marland Kitchen... 1 tablet by mouth two times a day Her updated medication list for this problem includes:    Hydrocodone-acetaminophen 5-325 Mg Tabs (Hydrocodone-acetaminophen) .Marland Kitchen... 1 by mouth up to 4 times per day as needed for pain    Aleve 220 Mg Tabs (Naproxen sodium) ..... One by mouth q 8 hours as needed for pain and inflammation  Orders: [facility]- Est  Level 4 (99214)  Problem # 3:  FOOT PAIN, LEFT (ICD-729.5) Assessment: New  See #1.  Orders: [facility]- Est  Level 4 (99214)  Complete Medication List: 1)  Norvasc 10 Mg Tabs (Amlodipine besylate) .Marland Kitchen.. 1 tablet daily for blood pressure 2)  Phenobarbital 100 Mg Tabs (Phenobarbital) .Marland Kitchen.. 1 tablet daily for seizure prevention 3)  Flonase 50 Mcg/act Susp (Fluticasone propionate) .... 2 sprays in each nostril each day 4)  Simvastatin 80 Mg Tabs (Simvastatin) .Marland Kitchen.. 1 by mouth at bedtime 5)  Prilosec Otc 20 Mg Tbec (Omeprazole magnesium) .... One tablet daily 6)  Coreg 3.125 Mg Tabs (Carvedilol) .Marland Kitchen.. 1 tablet twice a day for blood pressure and headache prevention 7)  Celexa 20 Mg Tabs (Citalopram hydrobromide) .... One tablet at bedtime 8)  Hydrocodone-acetaminophen 5-325 Mg Tabs (Hydrocodone-acetaminophen) .Marland Kitchen.. 1 by mouth up to 4 times per day as needed for pain 9)  Aleve 220 Mg Tabs (Naproxen sodium) .... One by mouth q 8 hours as needed for pain and inflammation  Patient Instructions: 1)  It was nice to see you today. 2)  I am prescribing an anti-inflammatory and vicodin for pain control. 3)  Please follow-up with your Ortho doctor. Prescriptions: ALEVE 220 MG TABS (NAPROXEN SODIUM) one by mouth q 8 hours as needed for pain and inflammation  #60 x 0   Entered and Authorized by:   Helane Rima DO   Signed by:   Helane Rima DO on 03/30/2010   Method used:   Print then Give to Patient   RxID:    [id number] HYDROCODONE-ACETAMINOPHEN 5-325 MG TABS (HYDROCODONE-ACETAMINOPHEN) 1 by mouth up to 4 times per day as needed for pain  #30 x 0   Entered and Authorized by:   Helane Rima DO   Signed by:   Helane Rima DO on 03/30/2010   Method used:   Print then Give to Patient   RxID:   [id number]   Prevention & Chronic Care Immunizations   Influenza vaccine: Fluvax Non-MCR  (11/07/2009)   Influenza vaccine due: 07/28/2008    Tetanus booster: Not documented    Pneumococcal vaccine: Pneumovax  (07/29/2007)   Pneumococcal vaccine due: None    H. zoster vaccine: Not documented  Colorectal Screening   Hemoccult: Done.  (05/26/2004)   Hemoccult due: Not Indicated  Colonoscopy: Done.  (02/19/2002)   Colonoscopy due: 02/20/2012  Other Screening   Pap smear: Done.  (12/20/2001)   Pap smear due: Not Indicated    Mammogram: Normal  (08/22/2007)   Mammogram due: 08/2008    DXA bone density scan: Not documented   Smoking status: current  (03/30/2010)   Smoking cessation counseling: yes  (03/30/2010)  Lipids   Total Cholesterol: 137  (11/14/2009)   LDL: 74  (11/14/2009)   LDL Direct: Not documented   HDL: 46  (11/14/2009)   Triglycerides: 86  (11/14/2009)    SGOT (AST): 15  (11/14/2009)   SGPT (ALT): 16  (11/14/2009)   Alkaline phosphatase: 81  (11/14/2009)   Total bilirubin: 0.2  (11/14/2009)    Lipid flowsheet reviewed?: Yes   Progress toward LDL goal: Unchanged  Hypertension   Last Blood Pressure: 126 / 88  (03/30/2010)   Serum creatinine: 0.68  (11/14/2009)   Serum potassium 4.3  (11/14/2009)    Hypertension flowsheet reviewed?: Yes   Progress toward BP goal: At goal  Self-Management Support :   Personal Goals (by the next clinic visit) :      Personal blood pressure goal: 140/90  (11/07/2009)     Personal LDL goal: 70  (11/07/2009)    Patient will work on the following items until the next clinic visit to reach self-care goals:      Medications and monitoring: take my medicines every day, bring all of my medications to every visit  (03/30/2010)     Eating: drink diet soda or water instead of juice or soda, eat more vegetables, use fresh or frozen vegetables, eat foods that are low in salt, eat baked foods instead of fried foods, eat fruit for snacks and desserts, limit or avoid alcohol  (03/30/2010)     Activity: take a 30 minute walk every day, take the stairs instead of the elevator, park at the far end of the parking lot  (11/07/2009)    Hypertension self-management support: Written self-care plan  (03/30/2010)   Hypertension self-care plan printed.    Lipid self-management support: Written self-care plan  (03/30/2010)   Lipid self-care plan printed.

## 2010-11-21 NOTE — Assessment & Plan Note (Signed)
Summary: constant cough, runny nose/tlb   Vital Signs:  Patient profile:   60 year old female Weight:      203 pounds O2 Sat:      97 % on Room air Temp:     99.8 degrees F oral Pulse rate:   91 / minute Pulse rhythm:   regular BP sitting:   130 / 93  (left arm) Cuff size:   large  Vitals Entered By: Loralee Pacas CMA (September 05, 2010 6:04 PM)  O2 Flow:  Room air CC: coughing, runny nose x 1 day   Primary Care Provider:  Helane Rima DO  CC:  coughing and runny nose x 1 day.  History of Present Illness: 60 yo female with COPD, cough for 1 day, severe.  The patient is a smoker and continues to smoke.    COUGH Onset: 1 day Description: wet sounding, severe.  Symptoms Productive: NO Wheezing: YES Dyspnea: MILD Nasal discharge: NO Fever: subjective. Sore throat: NO Sick contacts: NO Heartburn symptoms: NO  History of Asthma: YES/COPD  Red Flags  Weight loss: NO Hemoptysis: NO Edema: NO    Habits & Providers  Alcohol-Tobacco-Diet     Tobacco Status: current     Tobacco Counseling: to quit use of tobacco products     Cigarette Packs/Day: <0.25     Year Quit: 2007  Current Medications (verified): 1)  Norvasc 10 Mg Tabs (Amlodipine Besylate) .Marland Kitchen.. 1 Tablet Daily For Blood Pressure 2)  Phenobarbital 100 Mg  Tabs (Phenobarbital) .Marland Kitchen.. 1 Tablet Daily For Seizure Prevention 3)  Flonase 50 Mcg/act  Susp (Fluticasone Propionate) .... 2 Sprays in Each Nostril Each Day 4)  Simvastatin 80 Mg Tabs (Simvastatin) .Marland Kitchen.. 1 By Mouth At Bedtime 5)  Prilosec Otc 20 Mg  Tbec (Omeprazole Magnesium) .... One Tablet Daily 6)  Coreg 3.125 Mg  Tabs (Carvedilol) .Marland Kitchen.. 1 Tablet Twice A Day For Blood Pressure and Headache Prevention 7)  Celexa 20 Mg  Tabs (Citalopram Hydrobromide) .... One Tablet At Bedtime 8)  Hydrocodone-Acetaminophen 5-325 Mg Tabs (Hydrocodone-Acetaminophen) .Marland Kitchen.. 1 By Mouth Up To 4 Times Per Day As Needed For Pain 9)  Aleve 220 Mg Tabs (Naproxen Sodium) .... One By  Mouth Q 8 Hours As Needed For Pain and Inflammation 10)  Prednisone 50 Mg Tabs (Prednisone) .... One Tab By Mouth Daily For 5 Days. 11)  Azithromycin 250 Mg Tabs (Azithromycin) .... Two Tabs By Mouth On Day 1, Then One Tab By Mouth Daily For 4 More Days. 12)  Combivent 18-103 Mcg/act Aero (Ipratropium-Albuterol) .... Two Puffs Q4h As Needed For Sob/wheeze/cough  Allergies (verified): 1)  Amoxicillin (Amoxicillin) 2)  Aspirin (Aspirin) 3)  Codeine Phosphate (Codeine Phosphate) 4)  Prednisone (Prednisone) 5)  * Penicillins Group  Review of Systems       See HPI  Physical Exam  General:  Well-developed,well-nourished,in no acute distress; alert,appropriate and cooperative throughout examination Ears:  External ear exam shows no significant lesions or deformities.  Cerumen impaction bilaterally on otoscopy, removed with curette. Nose:  External nasal examination shows no deformity or inflammation. Nasal mucosa are pink and moist without lesions or exudates. Mouth:  Oral mucosa and oropharynx without lesions or exudates.  Neck:  No deformities, masses, or tenderness noted. Lungs:  Diffusely rhoncorous breath sounds with exp wheezing. Heart:  Normal rate and regular rhythm. S1 and S2 normal without gallop, murmur, click, rub or other extra sounds. Abdomen:  Bowel sounds positive,abdomen soft and non-tender without masses, organomegaly or hernias noted. Extremities:  No edema. Additional Exam:  Peak flows: 170, 180, 185. GIven albuterol/atrovent nebs in office and Solumedrol 250mg  IM and symptoms improved. Pt able to speak full sentences. Still coughing.   Impression & Recommendations:  Problem # 1:  COPD (ICD-496) Assessment Deteriorated Symptoms suggestive of COPD exacerbation with low-grade fever. Pt needs to stop smoking. Rx'ed combivent inhaler. Pred burst. CXR read as neg however density seen in RLL on independent review. Azithro Pt should RTC 1-2 weeks after acute illness  resolved to see PCP re: PFTs and contoller medications.  Her updated medication list for this problem includes:    Combivent 18-103 Mcg/act Aero (Ipratropium-albuterol) .Marland Kitchen..Marland Kitchen Two puffs q4h as needed for sob/wheeze/cough  Orders: [facility]- Est  Level 4 (99214) CXR- 2view (CXR) Solumedrol up to 125mg  (Z6109) Albuterol Sulfate Sol 1mg  unit dose (U0454) Atrovent 1mg  (Neb) (U9811)  Complete Medication List: 1)  Norvasc 10 Mg Tabs (Amlodipine besylate) .Marland Kitchen.. 1 tablet daily for blood pressure 2)  Phenobarbital 100 Mg Tabs (Phenobarbital) .Marland Kitchen.. 1 tablet daily for seizure prevention 3)  Flonase 50 Mcg/act Susp (Fluticasone propionate) .... 2 sprays in each nostril each day 4)  Simvastatin 80 Mg Tabs (Simvastatin) .Marland Kitchen.. 1 by mouth at bedtime 5)  Prilosec Otc 20 Mg Tbec (Omeprazole magnesium) .... One tablet daily 6)  Coreg 3.125 Mg Tabs (Carvedilol) .Marland Kitchen.. 1 tablet twice a day for blood pressure and headache prevention 7)  Celexa 20 Mg Tabs (Citalopram hydrobromide) .... One tablet at bedtime 8)  Hydrocodone-acetaminophen 5-325 Mg Tabs (Hydrocodone-acetaminophen) .Marland Kitchen.. 1 by mouth up to 4 times per day as needed for pain 9)  Aleve 220 Mg Tabs (Naproxen sodium) .... One by mouth q 8 hours as needed for pain and inflammation 10)  Prednisone 50 Mg Tabs (Prednisone) .... One tab by mouth daily for 5 days. 11)  Azithromycin 250 Mg Tabs (Azithromycin) .... Two tabs by mouth on day 1, then one tab by mouth daily for 4 more days. 12)  Combivent 18-103 Mcg/act Aero (Ipratropium-albuterol) .... Two puffs q4h as needed for sob/wheeze/cough  Other Orders: Cerumen Impaction Removal-[facility] (91478)  Patient Instructions: 1)  You are having a COPD exacerbation. 2)  You also may have a pneumonia. 3)  Take the new meds as directed. 4)  Come back to see your PCP in 1-2 weeks after you get over this illness to discuss going on a controller medication for your COPD. 5)  -Dr. Karie Schwalbe. Prescriptions: COMBIVENT 18-103 MCG/ACT AERO  (IPRATROPIUM-ALBUTEROL) Two puffs q4h as needed for SOB/wheeze/cough  #1 x 3   Entered and Authorized by:   Rodney Langton MD   Signed by:   Rodney Langton MD on 09/05/2010   Method used:   Print then Give to Patient   RxID:   [id number] AZITHROMYCIN 250 MG TABS (AZITHROMYCIN) Two tabs by mouth on day 1, then one tab by mouth daily for 4 more days.  #6 x 0   Entered and Authorized by:   Rodney Langton MD   Signed by:   Rodney Langton MD on 09/05/2010   Method used:   Print then Give to Patient   RxID:   [id number] PREDNISONE 50 MG TABS (PREDNISONE) One tab by mouth daily for 5 days.  #5 x 0   Entered and Authorized by:   Rodney Langton MD   Signed by:   Rodney Langton MD on 09/05/2010   Method used:   Print then Give to Patient   RxID:   [id number]    Medication Administration  Injection #  1:    Medication: Solumedrol up to 125mg     Diagnosis: COPD (ICD-496)    Route: IM    Site: L deltoid    Exp Date: 03/21/2014    Lot #: [id number]    Mfr: pfizer    Comments: pt given 250mg     Patient tolerated injection without complications    Given by: Loralee Pacas CMA (September 05, 2010 6:13 PM)  Medication # 1:    Medication: Albuterol Sulfate Sol 1mg  unit dose    Diagnosis: COPD (ICD-496)    Dose: 2.5mg     Route: inhaled    Exp Date: 01/20/2012    Lot #: [id number]    Mfr: nepron    Patient tolerated medication without complications    Given by: Loralee Pacas CMA (September 05, 2010 6:14 PM)  Medication # 2:    Medication: Atrovent 1mg  (Neb)    Diagnosis: COPD (ICD-496)    Dose: 0.5mg     Route: inhaled    Exp Date: 07/22/2011    Lot #: [id number]    Mfr: nephron    Patient tolerated medication without complications    Given by: Loralee Pacas CMA (September 05, 2010 6:15 PM)  Orders Added: 1)  [facility]- Est  Level 4 [47829] 2)  Cerumen Impaction Removal-[facility] [56213] 3)  CXR- 2view [CXR] 4)  Solumedrol up to 125mg  [J2930] 5)  Albuterol  Sulfate Sol 1mg  unit dose [J7613] 6)  Atrovent 1mg  (Neb) [Y8657]

## 2010-11-21 NOTE — Progress Notes (Signed)
  Phone Note Call from Patient   Caller: Daughter Summary of Call: pt is coughing heavily, and has runny nose. pt wanted to see pcp. pcp not avalible will put in work in for tomorrow @ 3.  informed daughter that if she got worse to take her to ED Initial call taken by: Loralee Pacas CMA,  September 04, 2010 4:32 PM

## 2010-11-21 NOTE — Miscellaneous (Signed)
Summary: smoc CA #?  Clinical Lists Changes rec'd message from [facility] asking for CA number. she is there for a shoulder problem. denied. she has not been here in almost a year. asked Darel Hong to tell pt to call & make appt here.Marland KitchenMarland KitchenGolden Circle RN  July 21, 2009 10:32 AM

## 2010-11-21 NOTE — Miscellaneous (Signed)
Summary: Orders Update  Clinical Lists Changes  Medications: Changed medication from LIPITOR 80 MG  TABS (ATORVASTATIN CALCIUM) One tablet daily for cholesterol control to SIMVASTATIN 80 MG TABS (SIMVASTATIN) 1 by mouth at bedtime - Signed Rx of SIMVASTATIN 80 MG TABS (SIMVASTATIN) 1 by mouth at bedtime;  #90 x 3;  Signed;  Entered by: Helane Rima DO;  Authorized by: Helane Rima DO;  Method used: Electronically to [facility] [id number] [address], [place], [place], [place]  [address], Ph: [phone], Fax: [phone]    Prescriptions: SIMVASTATIN 80 MG TABS (SIMVASTATIN) 1 by mouth at bedtime  #90 x 3   Entered and Authorized by:   Helane Rima DO   Signed by:   Helane Rima DO on 01/25/2010   Method used:   Electronically to        [facility] [address] (retail)       [address] [place], [place]  [address]       Ph: [phone]       Fax: [phone]   RxID:   [id number]  CHANGE FROM LIPITOR TO SIMVASTATIN 2/2 'MUST TRY AND FAIL' PER MEDICAID. WILL RECHECK FLP IN 3 MONTHS. PLEASE INFORM PATIENT OF CHANGE. Helane Rima DO  January 25, 2010 5:34 AM   Appended Document: Orders Update Pt. called and informed.  Starleen Blue RN 01/25/2010

## 2010-11-21 NOTE — Letter (Signed)
Summary: Results Letter  [facility]  [address]   [place], [place] [address]   Phone: [phone]  Fax: [phone]    11/21/2009  ROSALITA CAREY [address] [place], [place]  [address]  Dear Ms. Caso,  I reviewed your recent labs and am happy to tell you that they were normal with the exception of:  (1) Low Vitamin D.   Vitamin D is well known for its role in calcium absorption and bone metabolism. However, this is only one of many important health benefits that vitamin D has to offer. It is also known for its anti-inflammatory effects, anti-cancer activity, and its ability to strengthen the immune system.    I have sent a high-dose vitamin D supplement to your pharmacy to take once a week for 12 weeks to replete your vitamin D. As we discussed during your visit, you should be taking a daily multivitamin that contains calcium and vitamin D. (2) High Fasting Blood Sugar.    I would like for you to come in for another lab test called an A1c. This test will tell us how high your blood sugar has averaged over the past 3 months. Please come in for this test as soon as your can.  Please let me know if you have any questions or concerns.  Sincerely,   Helane Rima DO  Appended Document: Results Letter mailed

## 2010-11-21 NOTE — Assessment & Plan Note (Signed)
Summary: f/u pneumonia/still coughing/bmc   Vital Signs:  Patient profile:   60 year old female Height:      64.5 inches Weight:      207 pounds BMI:     35.11 Temp:     98.4 degrees F oral Pulse rate:   82 / minute BP sitting:   110 / 74  (left arm) Cuff size:   regular  Vitals Entered By: Tessie Fass CMA (September 27, 2010 3:28 PM) CC: f/u cough, rib pain Pain Assessment Patient in pain? yes     Location: left side Intensity: 9   Primary Care Provider:  Helane Rima DO  CC:  f/u cough and rib pain.  History of Present Illness: 60 yo F:  1. Cough: x months, dry, worse at night, was seen at [facility] last month and treated for COPD exacerbation, CXR at that time was neg, patient states that she got worse and went to [facility] - new CXR showed left lobe PNA, Abx changed to Avelox and given injection of Solumedrol. She finished medication, has continued to take Tessalon/Tussionex for cough. She stopped smoking about 3 weeks ago. No fever/chills, CP, SOB, HA, dizziness, V/D/C, LE edema, rash. She endorses some nausea with coughing. Using Combivent but not feeling much difference. She does have some left-sided rib pain with coughing.  2. Hx Fall: Patient mentioned this at end of visit. States that she has been falling recently 2/2 "knees just go out." [place] on left side, which may be cause of rib pain. Pain controlled with brace that she wears around her waist/chest. No head trauma, LOC, seizures, focal neuro deficits.  Current Medications (verified): 1)  Norvasc 10 Mg Tabs (Amlodipine Besylate) .Marland Kitchen.. 1 Tablet Daily For Blood Pressure 2)  Phenobarbital 100 Mg  Tabs (Phenobarbital) .Marland Kitchen.. 1 Tablet Daily For Seizure Prevention 3)  Flonase 50 Mcg/act  Susp (Fluticasone Propionate) .... 2 Sprays in Each Nostril Each Day 4)  Simvastatin 80 Mg Tabs (Simvastatin) .Marland Kitchen.. 1 By Mouth At Bedtime 5)  Prilosec Otc 20 Mg  Tbec (Omeprazole Magnesium) .... One Tablet Daily 6)  Coreg 3.125 Mg  Tabs (Carvedilol)  .Marland Kitchen.. 1 Tablet Twice A Day For Blood Pressure and Headache Prevention 7)  Celexa 20 Mg  Tabs (Citalopram Hydrobromide) .... One Tablet At Bedtime 8)  Hydrocodone-Acetaminophen 5-325 Mg Tabs (Hydrocodone-Acetaminophen) .Marland Kitchen.. 1 By Mouth Up To 4 Times Per Day As Needed For Pain 9)  Aleve 220 Mg Tabs (Naproxen Sodium) .... One By Mouth Q 8 Hours As Needed For Pain and Inflammation 10)  Combivent 18-103 Mcg/act Aero (Ipratropium-Albuterol) .... Two Puffs Q4h As Needed For Sob/wheeze/cough 11)  Tussionex Pennkinetic Er 8-10 Mg/2ml Lqcr (Chlorpheniramine-Hydrocodone) .... 5 Ml By Mouth Two Times A Day As Needed For Cough X 1-2 Weeks 12)  Ativan 0.5 Mg  Tabs (Lorazepam) .... One By Mouth Tonight, and One By Mouth Tomorrow Am For Bereavement/funeral  Allergies (verified): 1)  Amoxicillin (Amoxicillin) 2)  Aspirin (Aspirin) 3)  Codeine Phosphate (Codeine Phosphate) 4)  Prednisone (Prednisone) 5)  * Penicillins Group PMH-FH-SH reviewed for relevance  Review of Systems      See HPI  Physical Exam  General:  Well-developed,well-nourished, in no acute distress; alert, appropriate and cooperative throughout examination. Vitals reviewed. Chest Wall:  TTP along left lower ribs. No step-off or obvious abnormality. Lungs:  Normal respiratory effort, chest expands symmetrically. Lungs are clear to auscultation, no crackles or wheezes. Heart:  Normal rate and regular rhythm. S1 and S2 normal without gallop,  murmur, click, rub or other extra sounds. Pulses:  2+ DP. Extremities:  No edema. Neurologic:  Alert & oriented X3, cranial nerves II-XII intact, strength normal in all extremities, sensation intact to light touch, gait normal, and DTRs symmetrical and normal.   Psych:  Normally interactive and good eye contact.     Impression & Recommendations:  Problem # 1:  COPD (ICD-496) Assessment Unchanged No red flags today. Explained that cough is likely bronchospasm and can last for a month after acute  illness. Encouraged continued smoking cessation. Patient to follow-up in one month to recheck and pursue further maintenance therapy. Rx Tussionex. Red flags given. Her updated medication list for this problem includes:    Combivent 18-103 Mcg/act Aero (Ipratropium-albuterol) .Marland Kitchen..Marland Kitchen Two puffs q4h as needed for sob/wheeze/cough  Orders: [facility]- Est  Level 4 ([address])  Problem # 2:  UNSPECIFIED FALL (ICD-E888.9) Assessment: New Unclear in patient with Hx of somatoform disorder. No red flags on exam. Will have PT evaluate. Orders: Physical Therapy Referral (PT) [facility]- Est  Level 4 ([address])  Complete Medication List: 1)  Norvasc 10 Mg Tabs (Amlodipine besylate) .Marland Kitchen.. 1 tablet daily for blood pressure 2)  Phenobarbital 100 Mg Tabs (Phenobarbital) .Marland Kitchen.. 1 tablet daily for seizure prevention 3)  Flonase 50 Mcg/act Susp (Fluticasone propionate) .... 2 sprays in each nostril each day 4)  Simvastatin 80 Mg Tabs (Simvastatin) .Marland Kitchen.. 1 by mouth at bedtime 5)  Prilosec Otc 20 Mg Tbec (Omeprazole magnesium) .... One tablet daily 6)  Coreg 3.125 Mg Tabs (Carvedilol) .Marland Kitchen.. 1 tablet twice a day for blood pressure and headache prevention 7)  Celexa 20 Mg Tabs (Citalopram hydrobromide) .... One tablet at bedtime 8)  Hydrocodone-acetaminophen 5-325 Mg Tabs (Hydrocodone-acetaminophen) .Marland Kitchen.. 1 by mouth up to 4 times per day as needed for pain 9)  Aleve 220 Mg Tabs (Naproxen sodium) .... One by mouth q 8 hours as needed for pain and inflammation 10)  Combivent 18-103 Mcg/act Aero (Ipratropium-albuterol) .... Two puffs q4h as needed for sob/wheeze/cough 11)  Tussionex Pennkinetic Er 8-10 Mg/75ml Lqcr (Chlorpheniramine-hydrocodone) .... 5 ml by mouth two times a day as needed for cough x 1-2 weeks 12)  Ativan 0.5 Mg Tabs (Lorazepam) .... One by mouth tonight, and one by mouth tomorrow am for bereavement/funeral  Patient Instructions: 1)  I am sending you to the physical therapist. Prescriptions: ATIVAN 0.5 MG  TABS  (LORAZEPAM) one by mouth tonight, and one by mouth tomorrow am for bereavement/funeral  #0 x 0   Entered and Authorized by:   Helane Rima DO   Signed by:   Helane Rima DO on 09/27/2010   Method used:   Print then Give to Patient   RxID:   [id number] TUSSIONEX PENNKINETIC ER 8-10 MG/5ML LQCR (CHLORPHENIRAMINE-HYDROCODONE) 5 ml by mouth two times a day as needed for cough x 1-2 weeks  #0 x 0   Entered and Authorized by:   Helane Rima DO   Signed by:   Helane Rima DO on 09/27/2010   Method used:   Print then Give to Patient   RxID:   [id number]    Orders Added: 1)  Physical Therapy Referral [PT] 2)  [facility]- Est  Level 4 [[address]]
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
## 2010-11-23 NOTE — Progress Notes (Signed)
Summary: UPDATE PROBLEM LIST   

## 2011-01-09 ENCOUNTER — Telehealth: Payer: Self-pay | Admitting: *Deleted

## 2011-01-09 NOTE — Telephone Encounter (Signed)
Received call from [facility] pharmacy stating Phenobarbitol 100 mg has been discontinued . They can only get 97.2 mg now. Paged Dr. Earlene Plater and she advises it will be ok to change to 97.2 mg. Patient had 4 refills left on the RX.

## 2011-01-19 NOTE — Telephone Encounter (Signed)
Read and agree.

## 2011-02-02 ENCOUNTER — Ambulatory Visit (INDEPENDENT_AMBULATORY_CARE_PROVIDER_SITE_OTHER): Payer: Medicaid Other | Admitting: Family Medicine

## 2011-02-02 ENCOUNTER — Encounter: Payer: Self-pay | Admitting: *Deleted

## 2011-02-02 VITALS — BP 120/80 | HR 84 | Temp 98.4°F | Ht 66.0 in | Wt 216.4 lb

## 2011-02-02 DIAGNOSIS — R52 Pain, unspecified: Secondary | ICD-10-CM

## 2011-02-02 DIAGNOSIS — F449 Dissociative and conversion disorder, unspecified: Secondary | ICD-10-CM

## 2011-02-02 LAB — BASIC METABOLIC PANEL
CO2: 22 mEq/L (ref 19–32)
Calcium: 9.2 mg/dL (ref 8.4–10.5)
Chloride: 101 mEq/L (ref 96–112)

## 2011-02-02 MED ORDER — NAPROXEN SODIUM 220 MG PO TABS: 220.0000 mg | ORAL_TABLET | Freq: Three times a day (TID) | ORAL | Status: DC | PRN

## 2011-02-02 NOTE — Patient Instructions (Signed)
I will prescribe naproxen for you.  This is an anti-inflammatory pain medication that will help with your pain and swelling. I do not recommend narcotic pain medications for you as this can have many side effects including falls. Please make follow-up with Dr. Earlene Plater in 1 week to further discuss your concerns about falling She has recommended physical therapy in the past and I agree that it will help you

## 2011-02-02 NOTE — Assessment & Plan Note (Addendum)
Bilateral hand and foot swelling with no objective sign of pathology in complicated patient with multiple medical comorbidities as well as conversion disorder.  Because of this and no recent labwork, will draw bmet, sed rate, and CK.  Unless markedly elevated, will not plan to change therapy or workup at this time.  Will refilll naproxen.  She asks for vicodin which her PCP has not prescribed for chronic pain review of chart.  Declined to refill narcotics, discussed importance on physical therapy, and urged her to follow-up with PCP in 1 week to discuss her continued concerns about frequent falls.  Labs all normal:  ESR borderline for age.  No need for further workup at this time

## 2011-02-02 NOTE — Telephone Encounter (Signed)
This encounter was created in error - please disregard.

## 2011-02-02 NOTE — Telephone Encounter (Deleted)
PA required for prevacid. form placed in MD box.

## 2011-02-02 NOTE — Telephone Encounter (Signed)
Noet regarding prevacid PA was entered in error .

## 2011-02-02 NOTE — Assessment & Plan Note (Signed)
Suspect today's presentation of edema is related to conversion disorder, cannot rule out drug seeking behavior.

## 2011-02-02 NOTE — Progress Notes (Signed)
  Subjective:    Patient ID: [name], female    DOB: 1950/12/02, 60 y.o.   MRN: [id number]  HPI Here for work-in appt.  States she has had hand and foot pain and swelling for 2-3 weeks.  States prior to this, she was seen in an outside ER on 12/24/10 for hip pain after a fall and was prescribed robaxin and percocet.  She also has bottle from another visit at unknown facility with bottle dated 3/26 for tramadol.  Patient states she has continued to fall frequently, denying LOC or syncope.  She was referred to PT but does not appear to have gone and she states she is not interested at this time.  She says her PCP prescribes her percocet and would like more.  Review of Systems     Objective:   Physical Exam  Constitutional: She appears well-developed and well-nourished.       obese  Cardiovascular: Normal rate and regular rhythm.   No murmur heard. Pulmonary/Chest: Effort normal and breath sounds normal. She has no wheezes.  Abdominal: Soft.  Musculoskeletal:       No significant joint or soft tissue swelling noted on hands or feet.  No erythema or warmth.  5/5 strength in upper and lower extremities.  Normal sensation to light touch.            Assessment & Plan:

## 2011-02-03 LAB — SEDIMENTATION RATE: Sed Rate: 38 mm/hr — ABNORMAL HIGH (ref 0–22)

## 2011-02-15 ENCOUNTER — Encounter: Payer: Self-pay | Admitting: Family Medicine

## 2011-02-15 ENCOUNTER — Ambulatory Visit (INDEPENDENT_AMBULATORY_CARE_PROVIDER_SITE_OTHER): Payer: Medicaid Other | Admitting: Family Medicine

## 2011-02-15 DIAGNOSIS — R52 Pain, unspecified: Secondary | ICD-10-CM

## 2011-02-15 DIAGNOSIS — Z9181 History of falling: Secondary | ICD-10-CM

## 2011-02-15 DIAGNOSIS — R296 Repeated falls: Secondary | ICD-10-CM | POA: Insufficient documentation

## 2011-02-15 DIAGNOSIS — Z23 Encounter for immunization: Secondary | ICD-10-CM

## 2011-02-15 DIAGNOSIS — F449 Dissociative and conversion disorder, unspecified: Secondary | ICD-10-CM

## 2011-02-15 MED ORDER — SIMVASTATIN 80 MG PO TABS: 40.0000 mg | ORAL_TABLET | Freq: Every day | ORAL | Status: DC

## 2011-02-15 MED ORDER — ACETAMINOPHEN 500 MG PO TABS: 1000.0000 mg | ORAL_TABLET | Freq: Four times a day (QID) | ORAL | Status: DC | PRN

## 2011-02-15 MED ORDER — KETOROLAC TROMETHAMINE 60 MG/2ML IM SOLN
60.0000 mg | Freq: Once | INTRAMUSCULAR | Status: AC
  Administered 2011-02-15: 60 mg via INTRAMUSCULAR

## 2011-02-15 NOTE — Progress Notes (Signed)
  Subjective:    Patient ID: [name], female    DOB: August 28, 1951, 60 y.o.   MRN: [id number]  HPI  1. Bilateral Hand Pain: See last OV note for more details. Bilateral hand pain, burning, swelling for > 1 month now. No injury or change in medication/mood. This has never happened before, though she has chronic pain issues (back, shoulders, knees). States that it impairs function at this point. Pain is "burning" in all joints in hands but not wrists. No fever/chills. Saw Dr. Earnest Bailey at last visit. Ordered labs. CK, BMP WNL. ESR slightly elevated at 38.  2. Falls: States that she is falling at least one time per month for various reasons - dizziness (describes both episodes of vertigo and presyncope), and "my knees just give out." She has never had LOC. No head trauma. Last fall last month and went to [facility] for evaluation. Was Dx with contusion of hip, Rx narcotic - which is the only thing that has helped pain in problem #1. Denies that this caused dizziness. She lives in her home with her husband and daughter. "They have to help me with everything." No stairs in the house. I referred her for PT at a previous visit for leg pain, but the patient did not go - "they never called me." Patient denies focal neurological deficits.  Review of Systems SEE HPI.    Objective:   Physical Exam  Constitutional: She is oriented to person, place, and time. She appears well-developed and well-nourished. No distress.  HENT:  Right Ear: External ear normal.  Left Ear: External ear normal.  Eyes: EOM are normal. Pupils are equal, round, and reactive to light.  Cardiovascular: Normal rate, regular rhythm, normal heart sounds and intact distal pulses.   Pulmonary/Chest: Effort normal and breath sounds normal.  Abdominal: Soft. Bowel sounds are normal.  Musculoskeletal:       Bilateral Hands: No edema. Normal pulses. TTP of every joint in both hands - no edema, redness, bony changes. Strength unable to be tested as  patient stated that she could not squeeze my fingers at all.   Patient able to stand without assist. When she was standing (without distress), I asked if she felt dizzy. She said "oh, yes!" and fell to the chair without injuring herself.  Neurological: She is alert and oriented to person, place, and time. She has normal reflexes. No cranial nerve deficit. Coordination normal.  Psychiatric:       Labile affect.      Assessment & Plan:

## 2011-02-15 NOTE — Assessment & Plan Note (Signed)
Will call this arthralgias, though it is more consistent with her chronic pain complaints in this complicated patient with a Hx of conversion disorder. Recent labs WNL. Will schedule Tylenol, re-refer to PT, and decrease dose of Zocor to see if these help at all. I explained that narcotics are not good choices for her (1) chronic pain, (2) Hx of falls. The patient was upset with this. Rx Toradol in office.

## 2011-02-15 NOTE — Patient Instructions (Signed)
It was nice to see you today.  I am sending you to physical therapy.  I am decreasing your Zocor and starting you on scheduled Tylenol daily.

## 2011-02-15 NOTE — Assessment & Plan Note (Signed)
Unclear etiology, again in this patient with multiple medical issues and Hx of conversion DO. Resending to PT - discussed importance of this and possible [facility] Safety Evaluation. No sedating medications. No red flags today.

## 2011-02-22 ENCOUNTER — Other Ambulatory Visit: Payer: Self-pay | Admitting: Family Medicine

## 2011-02-22 NOTE — Telephone Encounter (Signed)
Refill request

## 2011-03-06 NOTE — Procedures (Signed)
DUPLEX DEEP VENOUS EXAM - LOWER EXTREMITY   INDICATION:  Right lower extremity pain and swelling.   HISTORY:  Edema:  Yes.  Trauma/Surgery:  Yes.  Pain:  Yes.  PE:  No.  Previous DVT:  No.  Anticoagulants:  No.  Other:   DUPLEX EXAM:                CFV   SFV   PopV  PTV    GSV                R  L  R  L  R  L  R   L  R  L  Thrombosis    o  o  o     o     o      o  Spontaneous   +  +  +     +     +      +  Phasic        +  +  +     +     +      +  Augmentation  +  +  +     +     +      +  Compressible  +  +  +     +     +      +  Competent     +  +  +     +     +      +   Legend:  + - yes  o - no  p - partial  D - decreased   IMPRESSION:  No evidence of deep venous thrombosis noted in the right  leg.    _____________________________  Janetta Hora Fields, MD   MG/MEDQ  D:  12/27/2008  T:  12/28/2008  Job:  [id number]
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
## 2011-03-06 NOTE — Consult Note (Signed)
NAMEALVERA, TOURIGNY NO.:  [id number]   MEDICAL RECORD NO.:  [id number]          PATIENT TYPE:  OBV   LOCATION:  3703                         FACILITY:  [facility]   PHYSICIAN:  Vesta Mixer, M.D. DATE OF BIRTH:  1951-06-02   DATE OF CONSULTATION:  09/03/2007  DATE OF DISCHARGE:                                 CONSULTATION   HISTORY:  Ms. Miotke a 60 year old female with a history of anxiety,  chest pains and multiple medical problems.  We are asked to see her for  further episodes of chest pain.   The patient has a long history chest pain.  She reports having a heart  catheterization many years ago.  We do not have those records.  I  performed a stress Cardiolite study on her 4 years ago which was  negative for ischemia.  She had normal left ventricular systolic  function.   She has had numerous emergency room visits over the years for various  problems.   The patient was admitted on September 02, 2007, with some chest pain.  The pain lasted for hours.  It was not helped with nitroglycerin.  She  was noted to have T-wave inversions on the anterior leads.   The patient carries a diagnosis of a previous stroke, although several  CT scans have not seen any evidence of stroke.   Her current medications are phenobarbital once a day, diazepam once a  day, nortriptyline once a day.  She is allergic to ASPIRIN, PENICILLIN  and TETANUS TOXOID.   PAST MEDICAL HISTORY:  1. History of chest pain.  2. Anxiety/depression.  3. History of seizure disorder.   SOCIAL HISTORY:  The patient used to smoke but quit in 2001.  She does  not drink alcohol.   FAMILY HISTORY:  Positive for coronary artery disease, according to her.  The previous histories in the chart say that there is no history of  coronary artery disease.   REVIEW OF SYSTEMS:  Is reviewed and is essentially negative except as  noted in the HPI.   EXAMINATION:  She is a middle-aged female in no acute  distress.  She is  alert and oriented x3 and her mood and affect are normal.  Her blood  pressure is 123/87, heart rate 77.  HEENT EXAM:  Reveals 2+ carotids.  She has no bruits, no JVD, no  thyromegaly.  LUNGS:  Clear to auscultation.  HEART:  Regular rate, S1, S2.  ABDOMINAL EXAM:  Reveals good bowel sounds and is nontender.  EXTREMITIES:  She has no clubbing, cyanosis or edema.  NEUROLOGIC EXAM:  Nonfocal.   Her cardiac enzymes are normal.   Her EKG yesterday reveals normal sinus rhythm.  She has T-wave  inversions in the anterolateral leads.  Today's EKG reveals improvement  of the T-wave inversions.   I suspect that her pain is musculoskeletal.  In fact, she does have a  lot of musculoskeletal pain.  However, I cannot exclude the possibility  that she has some degree of ischemia.  She has lots of anxiety and panic  disorder and she  may in fact be having some coronary spasm.  I think  that she will continue to have intermittent episodes of chest pain with  multiple ER visits unless we can firmly conclude whether or not she has  coronary artery disease.  I think that we should proceed with heart  catheterization given all of this information.  We have discussed the  risks, benefits and options of heart catheterization.  She understands  and agrees to proceed.  We will schedule for 11 a.m. tomorrow.           ______________________________  Vesta Mixer, M.D.     PJN/MEDQ  D:  09/03/2007  T:  09/04/2007  Job:  [id number]   cc:   Towana Badger, M.D.

## 2011-03-06 NOTE — Procedures (Signed)
DUPLEX DEEP VENOUS EXAM - LOWER EXTREMITY   INDICATION:  Right lower extremity pain and swelling.   HISTORY:  Edema:  Approximately 6 weeks.  Trauma/Surgery:  Cast on calf/foot (right)  Pain: 6 weeks.  PE:  No.  Previous DVT:  No.  Anticoagulants:  No.  Other:   DUPLEX EXAM:                CFV   SFV   PopV  PTV    GSV                R  L  R  L  R  L  R   L  R  L  Thrombosis    o  o  o     o     o      o  Spontaneous   +  +  +     +     +      +  Phasic        +  +  +     +     +      +  Augmentation  +  +  +     +     +      +  Compressible  +  +  +     +     +      +  Competent     +  +  +     +     +      +   Legend:  + - yes  o - no  p - partial  D - decreased   IMPRESSION:  1. No evidence of deep venous thrombosis or superficial venous      thrombosis in the right lower extremity or the left common femoral      vein.  2. Unable to rule out deep venous thrombosis in the mid/distal calf      due to cast.  3. Note:  Patient in excessive pain during compression.         _____________________________  Janetta Hora Fields, MD   AS/MEDQ  D:  12/20/2008  T:  12/20/2008  Job:  [id number]

## 2011-03-06 NOTE — Cardiovascular Report (Signed)
[name], [name] NO.:  [id number]   MEDICAL RECORD NO.:  [id number]          PATIENT TYPE:  OBV   LOCATION:  3703                         FACILITY:  [facility]   PHYSICIAN:  Vesta Mixer, M.D. DATE OF BIRTH:  [birth year]   DATE OF PROCEDURE:  09/04/2007  DATE OF DISCHARGE:                            CARDIAC CATHETERIZATION   [name] is a 60 year old female with multiple medical problems.  She has a history of a stroke in the past.  She has had episodes of  chest pain in the past and has had a negative stress Cardiolite study.  She was admitted this admission with recurrent episodes of chest pain.  Because of her persistent aches and pains and multiple ER visits, we  have scheduled her for heart catheterization to get a definitive answer  regarding this chest pain.   PROCEDURE:  Left heart catheterization with coronary angiography.   The right femoral artery was easily cannulated using modified Seldinger  technique.   HEMODYNAMIC RESULTS:  LV pressure is 160/20 with an aortic pressure of  160/95.   ANGIOGRAPHY:  Left main is smooth and normal.   The left anterior descending artery is smooth and normal.  The diagonal  artery is moderate in size and is also smooth and normal.   The left circumflex artery is moderate in size.  It is smooth and normal  throughout its course.  The obtuse marginal artery is normal.   The right coronary artery is moderate size and is dominant.  It gives  off a small to medium size posterior descending artery which is normal.  The posterolateral segment arteries are normal.   Left ventriculogram was performed in a 30 RAO position.  It reveals  normal left ventricular systolic function.  Ejection fraction is around  65-70%.  There is no significant mitral regurgitation.   COMPLICATIONS:  None.   CONCLUSIONS:  1. Smooth and normal coronary arteries.  2. Normal left ventricular systolic function.  She does not have  any      significant structural heart disease to explain this chest pain.          ______________________________  Vesta Mixer, M.D.    PJN/MEDQ  D:  09/04/2007  T:  09/05/2007  Job:  [id number]   cc:   Towana Badger, M.D.

## 2011-03-09 NOTE — Discharge Summary (Signed)
[name], [name]              ACCOUNT NO.:  [id number]   MEDICAL RECORD NO.:  [id number]          PATIENT TYPE:  INP   LOCATION:  3703                         FACILITY:  [facility]   PHYSICIAN:  Leighton Roach McDiarmid, M.D.DATE OF BIRTH:  1951/09/18   DATE OF ADMISSION:  09/02/2007  DATE OF DISCHARGE:  09/04/2007                               DISCHARGE SUMMARY   DISCHARGE MEDICATIONS:  1. Diazepam 5 mg p.o. q.h.s.  2. Norvasc 10 mg p.o. daily.  3. Hydrocortisone cream 2.5% topical b.i.d.  4. Nortriptyline 10 mg p.o. daily.  5. Phenobarbital 100 mg p.o. daily.  6. Flonase 50 mcg two sprays in each nostril daily.  7. Lipitor 40 mg p.o. daily.   DISCHARGE DIAGNOSES:  1. Musculoskeletal chest pain.  2. Seizure disorder.  3. Anxiety disorder.  4. Hypertension.  5. Hyperlipidemia.   CONSULTATIONS:  Cardiology.   PROCEDURES:  1. Cardiac catheterization.  2. Angiogram of the chest, ICT.  3. Portable chest x-ray.   LABORATORY DATA:  Her admission D-dimer was 1.55.  Cardiac markers at  point of care were troponin I less than 0.05, CK MB less than 1.  Troponin I was negative x3.  TSH 1.647.  PT 13.3, INR 1.0.   BRIEF HOSPITAL COURSE:  1. Chest pain:  The patient presented with chest pain and acute      coronary syndrome was ruled out by negative EKG and negative      cardiac enzymes x3.  Despite this, the patient had persistent chest      pain, and a rule out cardiac catheterization was performed      demonstrating smooth and normal cardiac vessels.  Pulmonary      embolism was also ruled out with CT angiogram of the chest.  No      other acute process was suggested by the chest angiogram or the      portable chest x-ray or exam, and the patient was diagnosed as      having musculoskeletal pain.  2. Seizure disorder:  We continued the patient's home dose of      phenobarbital which was recommended by neurology.  3. Anxiety disorder:  We continued the patient's home dose of diazepam      and nortriptyline.  4. Hypertension:  The patient has not been taking her Norvasc, and was      given low-dose metoprolol on admission.  New metoprolol was      discontinued once an acute coronary disease had been ruled out.  5. Hyperlipidemia:  Her LDL was 148.  Her home dose of Lipitor was      continued, though she does not take this regularly at home.   The patient was discharged home on a low sodium heart healthy diet with  no restrictions on activity, and was given a follow up appointment with  her physician, Dr. Linden Dolin, at the [facility] on  September 05, 2007 at 10:30 a.m.   The patient was discharged to home in stable medical condition.      Romero Belling, MD  Electronically Signed  Leighton Roach McDiarmid, M.D.  Electronically Signed    MO/MEDQ  D:  09/10/2007  T:  09/10/2007  Job:  [id number]   cc:   Towana Badger, M.D.
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
## 2011-03-09 NOTE — Procedures (Signed)
[name], [name]              ACCOUNT NO.:  [id number]   MEDICAL RECORD NO.:  [id number]          PATIENT TYPE:  OUT   LOCATION:  SLEEP CENTER                 FACILITY:  [facility]   PHYSICIAN:  Clinton D. Maple Hudson, M.D. DATE OF BIRTH:  03/31/1951   DATE OF STUDY:  11/21/2005                              NOCTURNAL POLYSOMNOGRAM   REFERRING PHYSICIAN:  Dr. Doralee Albino.   DATE OF STUDY:  November 21, 2005.   INDICATION FOR STUDY:  Hypersomnia with sleep apnea. Special history of  seizure disorder.   HOME MEDICATIONS:  Diazepam, Midrin, Norvasc, phenobarbital, amitriptyline.   SLEEP ARCHITECTURE:  Total sleep time 380 minutes with sleep to efficiency  88%. Stage I was 3%, stage II 78%, stages III and IV 1%, REM 19% of total  sleep time. Sleep latency 47 minutes, REM latency 161 minutes, awake after  sleep onset 5.5 minutes, arousal index 13.9. Bedtime medication not  reported.   RESPIRATORY DATA:  Apnea/hypopnea index (AHI, RDI) 12.9 obstructive events  per hour indicating mild obstructive sleep apnea/hypopnea syndrome. This  included 2 obstructive apneas and 80 hypopneas. All sleep was supine. REM  AHI 36.4 per hour.   OXYGEN DATA:  Very loud snoring with oxygen desaturation to a nadir of 81%.  Mean oxygen saturation through the study was 90% on room air.   CARDIAC DATA:  Normal sinus rhythm.   MOVEMENT/PARASOMNIA:  Occasional leg jerk, insignificant. EEG (sample  enclosed) showed high-frequency sharp waves as well as sustained slow wave  activity of concern for epileptiform activity without associated motor  activity, tongue-biting confusion or incontinence.   IMPRESSION/RECOMMENDATIONS:  1.  Mild obstructive sleep apnea/hypopnea syndrome, AHI 12.9 per hour while      sleeping on back, loud snoring with oxygen desaturation to a nadir of      81%.  2.  Scores in this range are borderline for consideration of C-PAP therapy      unless conservative measures such as weight loss  have been unhelpful.  3.  Sustained abnormal EEG should be evaluated for epileptiform activity.      Note that additional temporal and frontal leads were included.      Clinton D. Maple Hudson, M.D.  Diplomate, Biomedical engineer of Sleep Medicine  Electronically Signed     CDY/MEDQ  D:  11/25/2005 13:29:03  T:  11/26/2005 00:51:30  Job:  [id number]
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
## 2011-03-09 NOTE — Op Note (Signed)
[name], [name]                          ACCOUNT NO.:  [id number]   MEDICAL RECORD NO.:  [id number]                   PATIENT TYPE:  AMB   LOCATION:                                       FACILITY:  [facility]   PHYSICIAN:  John C. Madilyn Fireman, M.D.                 DATE OF BIRTH:  Nov 22, 1950   DATE OF PROCEDURE:  01/19/2003  DATE OF DISCHARGE:                                 OPERATIVE REPORT   PROCEDURE:  Esophagogastroduodenoscopy.   ENDOSCOPIST:  Everardo All. Madilyn Fireman, M.D.   INDICATIONS FOR PROCEDURE:  Right upper quadrant abdominal pain with  ultrasound  and a sigmoid PIPIDA scan indicating as well as a CT scan non-  diagnostic, and failing to respond to proton pump inhibitor.   DESCRIPTION OF PROCEDURE:  The patient was placed in the left lateral  decubitus position and placed on the pulse monitor with continuous low-flow  oxygen delivered by nasal cannula.  She was sedated with 100 mcg of IV  fentanyl and 10 mg of IV Versed.  The Olympus video endoscope was advanced  under direct vision into the oropharynx and esophagus.  The esophagus was  straight and of normal caliber, with the squamocolumnar line at 38 cm.  There is no visible hiatal hernia, ring, stricture, or other abnormalities  of the gastroesophageal junction.  The stomach was entered and a small  amount of liquid secretions was suctioned from the fundus.  A retroflexed  view of the cardia was unremarkable.  The fundus and body appeared normal.  The antrum showed some streaky erosions with one small area of exudate in  one of them, consistent with a mild to moderate antral gastritis.  The  pylorus had no deformities and easily allowed passage of the endoscope into  the duodenum.  Both the bulb and second portion were well-inspected and  appeared to be within normal limits.  The scope was then withdrawn back into  the stomach and a CLO test obtained.  The scope was then withdrawn.  The patient returned to the recovery room in  stable condition.  She  tolerated the procedure well and there were no immediate complications.   IMPRESSION:  Mild to moderate gastritis, otherwise normal study.   PLAN:  Await the CLO test and treat for eradication if Helicobacter  positive.  Will consider a repeat ultrasound, since it was partially  contracted on the original study, although otherwise normal and the PIPIDA  scan was also normal.                                                John C. Madilyn Fireman, M.D.    JCH/MEDQ  D:  01/19/2003  T:  01/19/2003  Job:  [id number]   cc:  [facility]

## 2011-03-09 NOTE — Op Note (Signed)
[name], [name]                          ACCOUNT NO.:  [id number]   MEDICAL RECORD NO.:  [id number]                   PATIENT TYPE:  INP   LOCATION:  0469                                 FACILITY:  [facility]   PHYSICIAN:  Abigail Miyamoto, M.D.              DATE OF BIRTH:  29-Jan-1951   DATE OF PROCEDURE:  01/22/2003  DATE OF DISCHARGE:                                 OPERATIVE REPORT   PREOPERATIVE DIAGNOSES:  Cholecystitis.   POSTOPERATIVE DIAGNOSES:  Cholecystitis.   PROCEDURE:  Laparoscopic cholecystectomy with intraoperative cholangiogram.   SURGEON:  Abigail Miyamoto, M.D.   ASSISTANT:  Anselm Pancoast. Zachery Dakins, M.D.   ANESTHESIA:  General endotracheal anesthesia.   ESTIMATED BLOOD LOSS:  Minimal.   INDICATIONS FOR PROCEDURE:  [name] is a 60 year old female who  presented with right upper quadrant abdominal pain, nausea and vomiting. She  was found on ultrasound to have a contracted appearing gallbladder with  thickened wall but no stones. Liver function tests were normal. Given these  findings and the abdominal examination, a decision was made to proceed to  the operating room for cholecystectomy.   FINDINGS:  The patient was found to have a normal cholangiogram. She did  have a large amount of scarring in the abdomen and a lot of adhesions above  the liver.   DESCRIPTION OF PROCEDURE:  The patient was brought to the operating room,  identified as [name]. She was placed supine on the operating room  table and general anesthesia was induced. Her abdomen was then prepped and  draped in the usual sterile fashion. Using a #15 blade, a small vertical  incision was made above the umbilicus. The incision was carried down to the  fascia which was then opened with a scalpel. A hemostat was then used to  pass through the peritoneal cavity. A #0 Vicryl pursestring suture was the  placed around the fascial opening. The Hasson port was placed through the  opening  and insufflation of the abdomen was begun. After insertion of the  camera, the patient was found to have a large amount of adhesions to the  abdominal wall as well as adhesions from the liver to the abdominal wall.  One 5 mm  was placed in the patient's right upper quadrant. A wide  __________ excision was then used to take down the adhesions above the  liver. At this point, a 12 mm port was then placed in the patient's  epigastrium and a second 5 mm port was placed in the patient's right flank  under direct vision. The gallbladder was then identified and retracted  throughout the liver bed. Dissection was then carried out at the base of the  gallbladder. The cystic artery was found to be slight anterior and was  clipped twice proximally, once distally and transected with the scissors.  The cystic duct was then identified and clipped once distally. It was then  partly transected with the laparoscopic scissors. An angiocatheter was  inserted into the right upper quadrant and a cholangiocatheter was passed  through this and into the cystic duct. A cholangiogram was then performed  under direct fluoroscopy. Contrast appeared to flow easily into the entire  biliary system and duodenum without evidence of abnormality. At this point,  the gallbladder cystic duct was then clipped three times proximally and  completely transected with the scissors. The gallbladder was then slowly  dissected free from the liver bed with the electrocautery. Once the  gallbladder was free from the liver bed, it was removed through the incision  out the umbilicus. The #0 Vicryl at the umbilicus was tied in place closing  the fascial defect. The abdomen was again irrigated with normal saline.  Hemostasis appeared to be achieved. At this point, all ports were removed  and the abdomen was deflated. All incisions were then anesthetized with  0.25% Marcaine and then closed with 4-0 Monocryl subcuticular sutures. Steri-   Strips, gauze and tape were then applied.  The patient tolerated the  procedure well. All sponge, needle and instrument counts were correct at the  end of the procedure. The patient was then extubated in the operating room  and taken in stable condition to the recovery room.                                               Abigail Miyamoto, M.D.    DB/MEDQ  D:  01/22/2003  T:  01/23/2003  Job:  [id number]

## 2011-03-09 NOTE — Discharge Summary (Signed)
[name], [place]                          ACCOUNT NO.:  [id number]   MEDICAL RECORD NO.:  [id number]                   PATIENT TYPE:  INP   LOCATION:  6703                                 FACILITY:  [facility]   PHYSICIAN:  Leighton Roach McDiarmid, M.D.             DATE OF BIRTH:  1951-09-30   DATE OF ADMISSION:  07/01/2003  DATE OF DISCHARGE:  07/05/2003                                 DISCHARGE SUMMARY   DISCHARGE DIAGNOSES:  1. Altered mental status secondary to polypharmacy.  2. Chronic obstructive pulmonary disease.  3. Idiopathic headache.  4. History of basal ganglia/lacunar cerebrovascular accident.   DISCHARGE MEDICATIONS:  1. Amlodipine 5 mg daily.  2. Phenobarbital 100 mg q.h.s.  3. Aspirin 325 mg daily.  4. Advair 100/50 one puff b.i.d.  5. Atrovent inhaler two puffs q.i.d.  6. Diazepam 5 mg b.i.d.  7. Amitriptyline 75 mg q.h.s.  8. Nexium 40 mg daily.  9. Albuterol inhaler two puffs p.r.n.   DISPOSITION:  The patient is discharged home with a walker and home health  physical therapy.   FOLLOWUP:  1. July 23, 2003, with Dr. Katrinka Blazing.  2. The patient is instructed to call [facility] at     [phone] to arrange for a therapist/counselor who accepts Medicaid.   CONSULTATIONS:  1. PT, OT, ST.  2. Psychiatry.  3. Stroke team.   PROCEDURES:  1. Carotid Dopplers, negative.  2. Head CT showing the old lacunar/basal ganglia CVA, otherwise normal.   HISTORY:  This is a 60 year old white female who presented to [facility] ED  on June 29, 2003, for stumbling, fall, and urinary incontinence, memory  problems, and increased sleepiness.  She was actually sent over to [facility] ED on July 01, 2003.  She reports no improvement in her  symptoms since being discharged from the ED on June 29, 2003, at [facility].  She states her symptoms actually began June 26, 2003.  She denied  any overdose of medications, and states  that her daughter-in-law supervises  her medications.  She denies any alcohol or drug use.  Denied any syncopal  episodes.  The patient had slurred speech, blurry vision, and complained of  bilateral temporal headaches with tenderness.  She also reported having  difficulty with her gait, and now requiring a wheelchair to ambulate.   LABORATORY DATA:  Admission labs included a CBC and BMP that were within  normal limits.  Urine drug screen was positive for benzodiazepines and  barbiturates.  Urinalysis was negative.  Chest x-ray also showed no active  disease.   HOSPITAL COURSE:  #1 -  ALTERED MENTAL STATUS:  After examining the  patient's list of medications or what she reported to be taking at home, it  was found that she was taking a number of anticholinergic medications.  Therefore, when she was admitted she was only kept on necessary  medications  throughout hospitalization.  Her lethargy improved significantly and was  discharged on the above medications listed.  The patient also did complain  of some lower extremity weakness initially with slurred speech, and was  evaluated by speech therapy which found her evaluation to be within normal  limits, and the stroke team was consulted about the patient's lower  extremity weakness.  Her head CT was reviewed, carotid Dopplers were  negative, and it was found that the patient did not have any evidence of any  new CVA.  It was also understood that the plethora of the patient's symptoms  that she was having could not be explained by one event alone, and it was  thought to be somewhat of a psychiatric component with possible secondary  gains from the patient being in the sick role.  #2 -  HEADACHE:  The patient again complained of bitemporal headache with  tenderness to palpation.  Sedimentation rate was checked to rule out  temporal arteritis, and that was 41.  There were no objective findings on  physical examination, and again the CT was  within normal limits.  The  patient did not require any pain medications for her headache during the  hospitalization, and was discharged without pain medication.  #3 -  CHRONIC OBSTRUCTIVE PULMONARY DISEASE:  The patient was stable during  hospitalization on her home regimen with O2 saturations being greater than  90% on room air.  There was a smoking cessation consult done, and the  patient stated that she did not need any assistance during hospitalization  for smoking cessation.  #4 -  PSYCHIATRY:  The patient was stable again during hospitalization on  her home medications, but psychiatry was consulted for a questionable  somatization.  Psychiatry recommended further outpatient counseling with  close general medical followup.  The patient should establish regular  communication with counselor stat.  Primary M.D. should also communicate  with the counselor.      Paula Auerbach, MD                          Leighton Roach McDiarmid, M.D.    AD/MEDQ  D:  10/10/2003  T:  10/11/2003  Job:  [id number]   cc:   Nilda Simmer, M.D.  [facility]  [address] [place]  [place] [address]  Fax: [phone]

## 2011-03-16 ENCOUNTER — Ambulatory Visit: Payer: Medicaid Other | Attending: Family Medicine | Admitting: Physical Therapy

## 2011-03-16 ENCOUNTER — Other Ambulatory Visit: Payer: Self-pay | Admitting: Family Medicine

## 2011-03-16 NOTE — Telephone Encounter (Signed)
Refill request

## 2011-04-19 ENCOUNTER — Other Ambulatory Visit: Payer: Self-pay | Admitting: Family Medicine

## 2011-04-19 NOTE — Telephone Encounter (Signed)
Refill request

## 2011-05-07 ENCOUNTER — Emergency Department (HOSPITAL_COMMUNITY): Payer: Medicaid Other

## 2011-05-07 ENCOUNTER — Emergency Department (HOSPITAL_COMMUNITY)
Admission: EM | Admit: 2011-05-07 | Discharge: 2011-05-07 | Disposition: A | Discharge status: Home / Self Care | Payer: Medicaid Other | Attending: Emergency Medicine | Admitting: Emergency Medicine

## 2011-05-07 DIAGNOSIS — J45909 Unspecified asthma, uncomplicated: Secondary | ICD-10-CM | POA: Insufficient documentation

## 2011-05-07 DIAGNOSIS — M542 Cervicalgia: Secondary | ICD-10-CM | POA: Insufficient documentation

## 2011-05-07 DIAGNOSIS — R296 Repeated falls: Secondary | ICD-10-CM | POA: Insufficient documentation

## 2011-05-07 DIAGNOSIS — E785 Hyperlipidemia, unspecified: Secondary | ICD-10-CM | POA: Insufficient documentation

## 2011-05-07 DIAGNOSIS — R569 Unspecified convulsions: Secondary | ICD-10-CM | POA: Insufficient documentation

## 2011-05-07 DIAGNOSIS — Y92009 Unspecified place in unspecified non-institutional (private) residence as the place of occurrence of the external cause: Secondary | ICD-10-CM | POA: Insufficient documentation

## 2011-05-07 DIAGNOSIS — M25569 Pain in unspecified knee: Secondary | ICD-10-CM | POA: Insufficient documentation

## 2011-05-07 DIAGNOSIS — IMO0002 Reserved for concepts with insufficient information to code with codable children: Secondary | ICD-10-CM | POA: Insufficient documentation

## 2011-05-07 DIAGNOSIS — I10 Essential (primary) hypertension: Secondary | ICD-10-CM | POA: Insufficient documentation

## 2011-05-07 DIAGNOSIS — Z8673 Personal history of transient ischemic attack (TIA), and cerebral infarction without residual deficits: Secondary | ICD-10-CM | POA: Insufficient documentation

## 2011-06-06 ENCOUNTER — Other Ambulatory Visit: Payer: Self-pay | Admitting: Family Medicine

## 2011-06-06 MED ORDER — PHENOBARBITAL 100 MG PO TABS: 100.0000 mg | ORAL_TABLET | Freq: Every day | ORAL | Status: DC

## 2011-06-13 ENCOUNTER — Other Ambulatory Visit: Payer: Self-pay | Admitting: Family Medicine

## 2011-06-13 ENCOUNTER — Telehealth: Payer: Self-pay | Admitting: Family Medicine

## 2011-06-13 MED ORDER — PHENOBARBITAL 100 MG PO TABS: 100.0000 mg | ORAL_TABLET | Freq: Every day | ORAL | Status: DC

## 2011-06-13 NOTE — Telephone Encounter (Signed)
Pharmacy is called and give RF on phenobarb 100 mg #30 w/3 rf.

## 2011-06-13 NOTE — Telephone Encounter (Signed)
Pt has been out of her seizure meds for 3 days and pharmacy states they haven't heard from [place].  It looks like it was PRINT on 8/15 to [place] pharm - pls advise

## 2011-07-05 ENCOUNTER — Other Ambulatory Visit: Payer: Self-pay | Admitting: Family Medicine

## 2011-07-05 MED ORDER — AMLODIPINE BESYLATE 10 MG PO TABS: 10.0000 mg | ORAL_TABLET | Freq: Every day | ORAL | Status: DC

## 2011-07-20 ENCOUNTER — Other Ambulatory Visit: Payer: Self-pay | Admitting: Family Medicine

## 2011-07-20 MED ORDER — CITALOPRAM HYDROBROMIDE 20 MG PO TABS: 20.0000 mg | ORAL_TABLET | Freq: Every day | ORAL | Status: DC

## 2011-07-23 ENCOUNTER — Other Ambulatory Visit: Payer: Self-pay | Admitting: Family Medicine

## 2011-07-23 MED ORDER — OMEPRAZOLE 20 MG PO CPDR: 20.0000 mg | DELAYED_RELEASE_CAPSULE | Freq: Every day | ORAL | Status: DC

## 2011-07-23 MED ORDER — IPRATROPIUM-ALBUTEROL 18-103 MCG/ACT IN AERO: 2.0000 | INHALATION_SPRAY | RESPIRATORY_TRACT | Status: DC | PRN

## 2011-07-31 LAB — BASIC METABOLIC PANEL
BUN: 10
CO2: 27
Glucose, Bld: 89
Potassium: 4.1
Sodium: 139

## 2011-07-31 LAB — I-STAT 8, (EC8 V) (CONVERTED LAB)
Acid-Base Excess: 1
BUN: 13
Chloride: 106
Potassium: 3.9
pCO2, Ven: 42.5 — ABNORMAL LOW
pH, Ven: 7.396 — ABNORMAL HIGH

## 2011-07-31 LAB — CARDIAC PANEL(CRET KIN+CKTOT+MB+TROPI)
CK, MB: 0.3
CK, MB: 0.3
Relative Index: INVALID
Total CK: 42
Total CK: 43
Troponin I: 0.02

## 2011-07-31 LAB — CBC
HCT: 33.1 — ABNORMAL LOW
Hemoglobin: 11 — ABNORMAL LOW
MCHC: 33.1
MCV: 82.3
Platelets: 228
RBC: 4.03
RDW: 14.8
WBC: 7

## 2011-07-31 LAB — POCT CARDIAC MARKERS
Operator id: 234501
Troponin i, poc: <0.05

## 2011-07-31 LAB — PROTIME-INR
INR: 1
Prothrombin Time: 13.3

## 2011-07-31 LAB — POCT I-STAT CREATININE
Creatinine, Ser: 0.7
Operator id: 234501

## 2011-07-31 LAB — CK TOTAL AND CKMB (NOT AT ARMC): Total CK: 49

## 2011-08-10 ENCOUNTER — Ambulatory Visit (INDEPENDENT_AMBULATORY_CARE_PROVIDER_SITE_OTHER): Payer: Medicaid Other

## 2011-08-10 ENCOUNTER — Inpatient Hospital Stay (INDEPENDENT_AMBULATORY_CARE_PROVIDER_SITE_OTHER)
Admission: RE | Admit: 2011-08-10 | Discharge: 2011-08-10 | Disposition: A | Discharge status: Home / Self Care | Payer: Medicaid Other | Source: Ambulatory Visit | Attending: Emergency Medicine | Admitting: Emergency Medicine

## 2011-08-10 DIAGNOSIS — M25469 Effusion, unspecified knee: Secondary | ICD-10-CM

## 2011-08-10 DIAGNOSIS — M79609 Pain in unspecified limb: Secondary | ICD-10-CM

## 2011-08-21 ENCOUNTER — Ambulatory Visit (INDEPENDENT_AMBULATORY_CARE_PROVIDER_SITE_OTHER): Payer: Medicaid Other | Admitting: Family Medicine

## 2011-08-21 ENCOUNTER — Encounter: Payer: Self-pay | Admitting: Family Medicine

## 2011-08-21 VITALS — BP 120/70 | HR 72 | Ht 66.0 in | Wt 207.0 lb

## 2011-08-21 DIAGNOSIS — M25469 Effusion, unspecified knee: Secondary | ICD-10-CM

## 2011-08-21 DIAGNOSIS — Z23 Encounter for immunization: Secondary | ICD-10-CM

## 2011-08-21 DIAGNOSIS — M25461 Effusion, right knee: Secondary | ICD-10-CM

## 2011-08-21 NOTE — Assessment & Plan Note (Signed)
Swelling and pain in the right knee. Has been going on for one month. No fevers, chills, or other systemic symptoms. Patient is able to walk on it although it is painful, so decrease concern for septic joint. There is some erythema and warmth around the joint, but likely secondary to arthritis. Has been taking Percocet which was prescribed at the urgent care on 10/19. Patient has appointment with Dr. Madelon Lips this afternoon, who is the surgeon who did her prior knee surgeries.

## 2011-08-21 NOTE — Patient Instructions (Addendum)
Nice to meet you. I'm sorry that your knee is giving you problems. This is most likely a flare of your arthritis. Please keep your appointment this afternoon with the orthopedic doctor. Please come back and see your regular physician, Dr. Denyse Amass, in the next few weeks to months for regular well woman visit. We gave you your flu shot today. You need to have her mammogram done! Especially since you have sisters with breast cancer. Please make sure to do this as soon as possible!!!

## 2011-08-21 NOTE — Progress Notes (Signed)
S: Pt comes in today for knee pain and swelling.  This has been going on for 1-1/2 months. She has known history of knee problems and has had surgery on both of her knees by Dr. Madelon Lips. Patient states it swells and pops. She was told that she needed to come and be seen here before she could go in to see Dr. Madelon Lips. She has had some redness, swelling, and warmth as well over the past month. Patient has been taking Percocet 2 pills every 6 hours, which him to urgent care on 10/19. The knee x-ray done at that time showed a large joint effusion. She has not been taking any other medicines for pain. She's been using ice and heat to try to help the pain of her right knee. She is able to walk on it, although it is painful. She is having difficulties with getting up from a seated position because of the pain. Pain is 10 out of 10.   ROS: Per HPI  History  Smoking status  . Former Smoker  Smokeless tobacco  . Former Neurosurgeon  . Quit date: 11/03/2010    O:  Filed Vitals:   08/21/11 1133  BP: 120/70  Pulse: 72    Gen: NAD CV: RRR, no murmur Pulm: CTA bilat, no wheezes or crackles Ext: Warm, no chronic skin changes, right knee swollen with mild erythema and warmth on the lateral side of the joint; + TTP over lateral and medial aspects of knee as well as over patella; full ROM, normal strength but pain with extension and flexion of knee   A/P: 60 y.o. female p/w knee pain and swelling -See problem list -f/u in 1-2 months with PCP for well woman exam.

## 2011-09-24 ENCOUNTER — Other Ambulatory Visit: Payer: Self-pay | Admitting: Family Medicine

## 2011-09-24 MED ORDER — CARVEDILOL 3.125 MG PO TABS: 3.1250 mg | ORAL_TABLET | Freq: Two times a day (BID) | ORAL | Status: DC

## 2011-10-02 ENCOUNTER — Other Ambulatory Visit: Payer: Self-pay | Admitting: Family Medicine

## 2011-10-02 MED ORDER — PHENOBARBITAL 100 MG PO TABS: 100.0000 mg | ORAL_TABLET | Freq: Every day | ORAL | Status: DC

## 2011-11-05 ENCOUNTER — Ambulatory Visit (INDEPENDENT_AMBULATORY_CARE_PROVIDER_SITE_OTHER): Payer: Medicaid Other | Admitting: Family Medicine

## 2011-11-05 ENCOUNTER — Encounter: Payer: Self-pay | Admitting: Family Medicine

## 2011-11-05 VITALS — BP 132/78 | HR 88 | Temp 99.3°F | Ht 66.0 in | Wt 202.0 lb

## 2011-11-05 DIAGNOSIS — R6889 Other general symptoms and signs: Secondary | ICD-10-CM

## 2011-11-05 DIAGNOSIS — J111 Influenza due to unidentified influenza virus with other respiratory manifestations: Secondary | ICD-10-CM

## 2011-11-05 MED ORDER — HYDROCODONE-HOMATROPINE 5-1.5 MG/5ML PO SYRP: 5.0000 mL | ORAL_SOLUTION | Freq: Three times a day (TID) | ORAL | Status: AC | PRN

## 2011-11-05 MED ORDER — PROMETHAZINE HCL 12.5 MG PO TABS: 12.5000 mg | ORAL_TABLET | Freq: Three times a day (TID) | ORAL | Status: DC | PRN

## 2011-11-05 NOTE — Assessment & Plan Note (Signed)
I suspect [name] has the flu.  Unable to do a test today as supplies are limited.   Out of treatment window for Tamiflu. We'll treat symptoms with Tylenol cough Hycodan as allergy to codeine and nausea with Phenergan. Handout provided in red flags reviewed. [name] expresses understanding will followup when well for a wellness visit or sooner if needed.

## 2011-11-05 NOTE — Patient Instructions (Signed)
Thank you for coming in today. Take the cough medicine and the nausea medicine as needed.  Let me know if you have trouble breathing or can't keep fluids down. I think you have the flu. This may last a week or so.  See me in 1 week if you still feel bad.   Influenza A (H1N1) H1N1 formerly called "swine flu" is a new influenza virus causing sickness in people. The H1N1 virus is different from seasonal influenza viruses. However, the H1N1 symptoms are similar to seasonal influenza and it is spread from person to person. You may be at higher risk for serious problems if you have underlying serious medical conditions. The CDC and the [employer] are following reported cases around the world. CAUSES    The flu is thought to spread mainly person-to-person through coughing or sneezing of infected people.     A person may become infected by touching something with the virus on it and then touching their mouth or nose.  SYMPTOMS    Fever.     Headache.    Tiredness.    Cough.    Sore throat.     Runny or stuffy nose.     Body aches.     Diarrhea and vomiting  These symptoms are referred to as "flu-like symptoms." A lot of different illnesses, including the common cold, may have similar symptoms. DIAGNOSIS    There are tests that can tell if you have the H1N1 virus.     Confirmed cases of H1N1 will be reported to the state or local health department.     A doctor's exam may be needed to tell whether you have an infection that is a complication of the flu.  HOME CARE INSTRUCTIONS    Stay informed. Visit the [facility] website for current recommendations. Visit [employer]. You may also call 1-800-CDC-INFO (364 337 2513).     Get help early if you develop any of the above symptoms.     If you are at high risk from complications of the flu, talk to your caregiver as soon as you develop flu-like symptoms. Those at higher risk for complications include:     People 65  years or older.     People with chronic medical conditions.     Pregnant women.     Young children.     Your caregiver may recommend antiviral medicine to help treat the flu.     If you get the flu, get plenty of rest, drink enough water and fluids to keep your urine clear or pale yellow, and avoid using alcohol or tobacco.     You may take over-the-counter medicine to relieve the symptoms of the flu if your caregiver approves. (Never give aspirin to children or teenagers who have flu-like symptoms, particularly fever).  TREATMENT   If you do get sick, antiviral drugs are available. These drugs can make your illness milder and make you feel better faster. Treatment should start soon after illness starts. It is only effective if taken within the first day of becoming ill. Only your caregiver can prescribe antiviral medication.   PREVENTION    Cover your nose and mouth with a tissue or your arm when you cough or sneeze. Throw the tissue away.     Wash your hands often with soap and warm water, especially after you cough or sneeze. Alcohol-based cleaners are also effective against germs.     Avoid touching your eyes, nose or mouth. This is one way  germs spread.     Try to avoid contact with sick people. Follow public health advice regarding school closures. Avoid crowds.     Stay home if you get sick. Limit contact with others to keep from infecting them. People infected with the H1N1 virus may be able to infect others anywhere from 1 day before feeling sick to 5-7 days after getting flu symptoms.     An H1N1 vaccine is available to help protect against the virus. In addition to the H1N1 vaccine, you will need to be vaccinated for seasonal influenza. The H1N1 and seasonal vaccines may be given on the same day. The CDC especially recommends the H1N1 vaccine for:     Pregnant women.     People who live with or care for children younger than 11 months of age.     Health care and emergency  services personnel.     Persons between the ages of 32 months through 63 years of age.     People from ages 52 through 75 years who are at higher risk for H1N1 because of chronic health disorders or immune system problems.  FACEMASKS In community and home settings, the use of facemasks and N95 respirators are not normally recommended. In certain circumstances, a facemask or N95 respirator may be used for persons at increased risk of severe illness from influenza. Your caregiver can give additional recommendations for facemask use. IN CHILDREN, EMERGENCY WARNING SIGNS THAT NEED URGENT MEDICAL CARE:  Fast breathing or trouble breathing.     Bluish skin color.     Not drinking enough fluids.     Not waking up or not interacting normally.     Being so fussy that the child does not want to be held.     Your child has an oral temperature above 102 F (38.9 C), not controlled by medicine.     Your baby is older than 3 months with a rectal temperature of 102 F (38.9 C) or higher.     Your baby is 12 months old or younger with a rectal temperature of 100.4 F (38 C) or higher.     Flu-like symptoms improve but then return with fever and worse cough.  IN ADULTS, EMERGENCY WARNING SIGNS THAT NEED URGENT MEDICAL CARE:  Difficulty breathing or shortness of breath.     Pain or pressure in the chest or abdomen.     Sudden dizziness.     Confusion.    Severe or persistent vomiting.     Bluish color.     You have a oral temperature above 102 F (38.9 C), not controlled by medicine.     Flu-like symptoms improve but return with fever and worse cough.  SEEK IMMEDIATE MEDICAL CARE IF:   You or someone you know is experiencing any of the above symptoms. When you arrive at the emergency center, report that you think you have the flu. You may be asked to wear a mask and/or sit in a secluded area to protect others from getting sick. MAKE SURE YOU:    Understand these instructions.      Will watch your condition.     Will get help right away if you are not doing well or get worse.  Some of this information courtesy of the CDC.  Document Released: 03/26/2008 Document Revised: 06/20/2011 Document Reviewed: 03/26/2008 [facility] Patient Information 2012 [place], [place].

## 2011-11-05 NOTE — Progress Notes (Signed)
[name] is a 61 year old woman who presents to clinic with 4 days of cough fever chills body aches nausea. She notes multiple family members with similar illness. She did have a flu shot this year. She is eating and drinking okay and is producing urine. She thinks she has a cold or the flu. She denies any dyspnea or chest pain.  PMH reviewed.  ROS as above otherwise neg Medications reviewed. Current Outpatient Prescriptions  Medication Sig Dispense Refill  . acetaminophen (TYLENOL) 500 MG tablet Take 2 tablets (1,000 mg total) by mouth every 6 (six) hours as needed for pain.  100 tablet  2  . albuterol-ipratropium (COMBIVENT) 18-103 MCG/ACT inhaler Inhale 2 puffs into the lungs every 4 (four) hours as needed. for SOB, wheezing or cough  1 Inhaler  12  . amLODipine (NORVASC) 10 MG tablet Take 1 tablet (10 mg total) by mouth daily.  30 tablet  6  . carvedilol (COREG) 3.125 MG tablet Take 1 tablet (3.125 mg total) by mouth 2 (two) times daily with a meal.  62 tablet  3  . citalopram (CELEXA) 20 MG tablet Take 1 tablet (20 mg total) by mouth at bedtime.  31 tablet  6  . fluticasone (FLONASE) 50 MCG/ACT nasal spray USE 2 SPRAYS IN EACH NOSTRIL ONCE A DAY  1 Act  11  . HYDROcodone-homatropine (HYCODAN) 5-1.5 MG/5ML syrup Take 5 mLs by mouth every 8 (eight) hours as needed for cough.  120 mL  0  . omeprazole (PRILOSEC) 20 MG capsule Take 1 capsule (20 mg total) by mouth daily.  30 capsule  6  . PHENObarbital (LUMINAL) 100 MG tablet Take 1 tablet (100 mg total) by mouth daily. for seizure prevention  31 tablet  5  . promethazine (PHENERGAN) 12.5 MG tablet Take 1 tablet (12.5 mg total) by mouth every 8 (eight) hours as needed for nausea.  20 tablet  0  . simvastatin (ZOCOR) 80 MG tablet Take 0.5 tablets (40 mg total) by mouth at bedtime.  30 tablet  3    Exam:  BP 132/78  Pulse 88  Temp(Src) 99.3 F (37.4 C) (Oral)  Ht 5\' 6"  (1.676 m)  Wt 202 lb (91.627 kg)  BMI 32.60 kg/m2 Gen: Well NAD HEENT:  EOMI,  MMM Lungs: CTABL Nl WOB Heart: RRR no MRG Abd: NABS, NT, ND Exts: Non edematous BL  LE, warm and well perfused.

## 2011-12-12 ENCOUNTER — Emergency Department: Payer: Self-pay | Admitting: Emergency Medicine

## 2011-12-24 ENCOUNTER — Emergency Department (HOSPITAL_COMMUNITY): Payer: Medicaid Other

## 2011-12-24 ENCOUNTER — Emergency Department (HOSPITAL_COMMUNITY)
Admission: EM | Admit: 2011-12-24 | Discharge: 2011-12-24 | Disposition: A | Discharge status: Home / Self Care | Payer: Medicaid Other | Attending: Emergency Medicine | Admitting: Emergency Medicine

## 2011-12-24 ENCOUNTER — Encounter (HOSPITAL_COMMUNITY): Payer: Self-pay | Admitting: Emergency Medicine

## 2011-12-24 DIAGNOSIS — M25569 Pain in unspecified knee: Secondary | ICD-10-CM | POA: Insufficient documentation

## 2011-12-24 DIAGNOSIS — E785 Hyperlipidemia, unspecified: Secondary | ICD-10-CM | POA: Insufficient documentation

## 2011-12-24 DIAGNOSIS — M79609 Pain in unspecified limb: Secondary | ICD-10-CM

## 2011-12-24 DIAGNOSIS — G4730 Sleep apnea, unspecified: Secondary | ICD-10-CM | POA: Insufficient documentation

## 2011-12-24 DIAGNOSIS — Z79899 Other long term (current) drug therapy: Secondary | ICD-10-CM | POA: Insufficient documentation

## 2011-12-24 DIAGNOSIS — K219 Gastro-esophageal reflux disease without esophagitis: Secondary | ICD-10-CM | POA: Insufficient documentation

## 2011-12-24 DIAGNOSIS — J4489 Other specified chronic obstructive pulmonary disease: Secondary | ICD-10-CM | POA: Insufficient documentation

## 2011-12-24 DIAGNOSIS — J449 Chronic obstructive pulmonary disease, unspecified: Secondary | ICD-10-CM | POA: Insufficient documentation

## 2011-12-24 DIAGNOSIS — I10 Essential (primary) hypertension: Secondary | ICD-10-CM | POA: Insufficient documentation

## 2011-12-24 DIAGNOSIS — M7989 Other specified soft tissue disorders: Secondary | ICD-10-CM | POA: Insufficient documentation

## 2011-12-24 MED ORDER — HYDROCODONE-ACETAMINOPHEN 5-325 MG PO TABS
2.0000 | ORAL_TABLET | Freq: Once | ORAL | Status: AC
  Administered 2011-12-24: 2 via ORAL
  Filled 2011-12-24: qty 2

## 2011-12-24 MED ORDER — HYDROCODONE-ACETAMINOPHEN 5-500 MG PO TABS: 1.0000 | ORAL_TABLET | Freq: Four times a day (QID) | ORAL | Status: DC | PRN

## 2011-12-24 NOTE — ED Notes (Signed)
Pt here with left leg swelling and pain to the whole left leg and knee, pain started 2 days ago

## 2011-12-24 NOTE — Discharge Instructions (Signed)
Take motrin or aleve as need for pain. You may also take vicodin as need for pain. No driving for the next 6 hours or when taking vicodin. Also, do not take tylenol or acetaminophen containing medication when taking vicodin. Elevate leg. Icepack/cold to sore area. Follow up with your orthopedist in next couple weeks.  The xrays were read as showing degenerative changes in the knee, and knee effusion.  The vascular doppler/ultrasound test was negative for clot in vein, but was felt to show a Bakers cyst. Discuss these results with your orthopedist (Dr Madelon Lips) at follow up with him.  Return to ER if worse, severe pain/swelling, redness, fevers, other concern.    Edema Edema is a buildup of fluids. It is most common in the feet, ankles, and legs. This happens more as a person ages. It may affect one or both legs. HOME CARE   Raise (elevate) the legs or ankles above the level of the heart while lying down.   Avoid sitting or standing still for a long time.   Exercise the legs to help the puffiness (swelling) go down.   A low-salt diet may help lessen the puffiness.   Only take medicine as told by your doctor.  GET HELP RIGHT AWAY IF:   You develop shortness of breath or chest pain.   You cannot breathe when you lie down.   You have more puffiness that does not go away with treatment.   You develop pain or redness in the areas that are puffy.   You have a temperature by mouth above 102 F (38.9 C), not controlled by medicine.   You gain 3 lb/1.4 kg or more in 1 day or 5 lb/2.3 kg in a week.  MAKE SURE YOU:   Understand these instructions.   Will watch your condition.   Will get help right away if you are not doing well or get worse.  Document Released: 03/26/2008 Document Revised: 09/27/2011 Document Reviewed: 03/26/2008 [facility] Patient Information 2012 [place], [place].     Baker's Cyst A Baker's cyst is a swelling that forms in the back of the knee. It is a sac-like  structure. It is filled with the same fluid that is located in your knee. The fluid located in your knee is necessary because it lubricates the bones and cartilage. It allows them to move over each other more easily. CAUSES  When the knee becomes injured or has soreness (inflammation) present, more fluid forms in the knee. When this happens, the joint lining is pushed out behind the knee and forms the baker's cyst. This cyst may also be caused by inflammation from arthritic conditions and infections. DIAGNOSIS  A Baker's cyst is most often diagnosed with an ultrasound. This is a specialized picture (like an X-ray). It shows a picture by using sound waves. Sometimes a specialized x-ray called an MRI (magnetic resonance imaging) is used. This picks up other problems within a joint if an ultrasound alone cannot make the diagnosis. If the cyst came immediately following an injury, plain x-rays may be used to make a diagnosis. TREATMENT  The treatment depends on the cause of the cyst. But most of these cysts are caused by an inflammation. Anti-inflammatory medications and rest often will get rid of the problem. If the cyst is caused by an infection, medications (antibiotics) will be prescribed to help this. Take the medications as directed. Refer to Home Care Instructions, below, for additional treatment suggestions. HOME CARE INSTRUCTIONS   If the cyst  was caused by an injury, for the first 24 hours, while lying down, keep the injured extremity elevated on 2 pillows.   For the first 24 hours while you are awake, apply ice bags (ice in a plastic bag with a towel around it to prevent frostbite to skin) 3 to 4 times per day for 15 to 20 minutes to the injured area. Then do as directed by your caregiver.   Only take over-the-counter or prescription medicines for pain, discomfort, or fever as directed by your caregiver.  Persistent pain and inability to use the injured area for more than 2 to 3 days are warning  signs indicating that you should see a caregiver for a follow-up visit as soon as possible. Persistent pain and swelling indicate that further evaluation, non-weight bearing (use of crutches as instructed), and/or further x-rays are needed. Make a follow-up appointment with your own caregiver. If conservative measures (rest, medications and inactivity) do not help the problem get better, sometimes surgery for removal of the cyst is needed. Reasons for this may be that the cyst is pressing on nerves and/or vessels and causing problems which cannot wait for improvement with conservative treatment. If the problem is caused by injuries to the cartilage in the knee, surgery is often needed for treatment of that problem. MAKE SURE YOU:   Understand these instructions.   Will watch your condition.   Will get help right away if you are not doing well or get worse.  Document Released: 10/08/2005 Document Revised: 09/27/2011 Document Reviewed: 05/26/2008 [facility] Patient Information 2012 [place], [place].      Osteoarthritis Osteoarthritis is the most common form of arthritis. It is redness, soreness, and swelling (inflammation) affecting the cartilage. Cartilage acts as a cushion, covering the ends of bones where they meet to form a joint. CAUSES  Over time, the cartilage begins to wear away. This causes bone to rub on bone. This produces pain and stiffness in the affected joints. Factors that contribute to this problem are:  Excessive body weight.   Age.   Overuse of joints.  SYMPTOMS   People with osteoarthritis usually experience joint pain, swelling, or stiffness.   Over time, the joint may lose its normal shape.   Small deposits of bone (osteophytes) may grow on the edges of the joint.   Bits of bone or cartilage can break off and float inside the joint space. This may cause more pain and damage.   Osteoarthritis can lead to depression, anxiety, feelings of helplessness, and  limitations on daily activities.  The most commonly affected joints are in the:  Ends of the fingers.   Thumbs.   Neck.   Lower back.   Knees.   Hips.  DIAGNOSIS  Diagnosis is mostly based on your symptoms and exam. Tests may be helpful, including:  X-rays of the affected joint.   A computerized magnetic scan (MRI).   Blood tests to rule out other types of arthritis.   Joint fluid tests. This involves using a needle to draw fluid from the joint and examining the fluid under a microscope.  TREATMENT  Goals of treatment are to control pain, improve joint function, maintain a normal body weight, and maintain a healthy lifestyle. Treatment approaches may include:  A prescribed exercise program with rest and joint relief.   Weight control with nutritional education.   Pain relief techniques such as:   Properly applied heat and cold.   Electric pulses delivered to nerve endings under the skin (transcutaneous  electrical nerve stimulation, TENS).   Massage.   Certain supplements. Ask your caregiver before using any supplements, especially in combination with prescribed drugs.   Medicines to control pain, such as:   Acetaminophen.   Nonsteroidal anti-inflammatory drugs (NSAIDs), such as naproxen.   Narcotic or central-acting agents, such as tramadol. This drug carries a risk of addiction and is generally prescribed for short-term use.   Corticosteroids. These can be given orally or as injection. This is a short-term treatment, not recommended for routine use.   Surgery to reposition the bones and relieve pain (osteotomy) or to remove loose pieces of bone and cartilage. Joint replacement may be needed in advanced states of osteoarthritis.  HOME CARE INSTRUCTIONS  Your caregiver can recommend specific types of exercise. These may include:  Strengthening exercises. These are done to strengthen the muscles that support joints affected by arthritis. They can be performed with  weights or with exercise bands to add resistance.   Aerobic activities. These are exercises, such as brisk walking or low-impact aerobics, that get your heart pumping. They can help keep your lungs and circulatory system in shape.   Range-of-motion activities. These keep your joints limber.   Balance and agility exercises. These help you maintain daily living skills.  Learning about your condition and being actively involved in your care will help improve the course of your osteoarthritis. SEEK MEDICAL CARE IF:   You feel hot or your skin turns red.   You develop a rash in addition to your joint pain.   You have an oral temperature above 102 F (38.9 C).  FOR MORE INFORMATION  National Institute of Arthritis and Musculoskeletal and Skin Diseases: www.niams.http://www.myers.net/ [employer] on Aging: https://walker.com/ American College of Rheumatology: www.rheumatology.org Document Released: 10/08/2005 Document Revised: 09/27/2011 Document Reviewed: 01/19/2010 [facility] Patient Information 2012 [place], [place].

## 2011-12-24 NOTE — Progress Notes (Signed)
VASCULAR LAB PRELIMINARY  PRELIMINARY  PRELIMINARY  PRELIMINARY  Left lower extremity venous duplex completed.    Preliminary report:  Left:  No evidence of DVT or superficial thrombosis. There is evidence of an area of mixed echoes coursing from the popliteal fossa to approximately 3 inched below the knee consistent with a ruptured Baker's cyst.  Nicanor Mendolia D, RVAQ 12/24/2011, 4:02 PM

## 2011-12-24 NOTE — ED Provider Notes (Signed)
History     CSN: [id number]  Arrival date & time 12/24/11  1327   First MD Initiated Contact with Patient 12/24/11 1522      Chief Complaint  Patient presents with  . Leg Pain    (Consider location/radiation/quality/duration/timing/severity/associated sxs/prior treatment) Patient is a 61 y.o. female presenting with leg pain. The history is provided by the patient.  Leg Pain   pt with left leg swelling and pain to knee/lower leg for past 2-3 days. Constant. Mild dull. Non radiating. Denies any trip, fall, strain or trauma to leg. No hx same. Hx arthritis in other knee. No hip pain. No chest pain or sob. No skin changes or erythema. No fever or chills.   Past Medical History  Diagnosis Date  . Hypertension   . Hyperlipidemia   . 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  . COPD (chronic obstructive pulmonary disease)   . Depression   . Personality disorder   . Chronic low back pain   . Sleep apnea     Mild. No need for CPAP.  Marland Kitchen History of CVA (cerebrovascular accident)   . GERD (gastroesophageal reflux disease)   . Migraine     Past Surgical History  Procedure Date  . Appendectomy   . Shoulder surgery   . Back surgery   . Cardiac catheterization 2008    Normal. Dr Elease Hashimoto.  . Knee surgery     Bilateral.    Family History  Problem Relation Age of Onset  . Cirrhosis Mother   . Diabetes Mother   . Cirrhosis Father   . Cancer Sister   . Cancer Sister     breast    History  Substance Use Topics  . Smoking status: Former Games developer  . Smokeless tobacco: Former Neurosurgeon    Quit date: 11/03/2010  . Alcohol Use: No    OB History    Grav Para Term Preterm Abortions TAB SAB Ect Mult Living                  Review of Systems  Constitutional: Negative for fever and chills.  HENT: Negative for neck pain.   Eyes: Negative for redness.  Respiratory: Negative for cough and shortness of breath.   Cardiovascular: Positive for leg swelling.  Negative for chest pain.  Gastrointestinal: Negative for abdominal pain.  Genitourinary: Negative for flank pain.  Musculoskeletal: Negative for back pain.  Skin: Negative for rash.  Neurological: Negative for headaches.  Hematological: Does not bruise/bleed easily.  Psychiatric/Behavioral: Negative for confusion.    Allergies  Amoxicillin; Aspirin; Codeine phosphate; Penicillins; and Prednisone  Home Medications   Current Outpatient Rx  Name Route Sig Dispense Refill  . AMLODIPINE BESYLATE 10 MG PO TABS Oral Take 1 tablet (10 mg total) by mouth daily. 30 tablet 6  . CARVEDILOL 3.125 MG PO TABS Oral Take 1 tablet (3.125 mg total) by mouth 2 (two) times daily with a meal. 62 tablet 3  . CITALOPRAM HYDROBROMIDE 20 MG PO TABS Oral Take 1 tablet (20 mg total) by mouth at bedtime. 31 tablet 6  . FLUTICASONE PROPIONATE 50 MCG/ACT NA SUSP  USE 2 SPRAYS IN EACH NOSTRIL ONCE A DAY 1 Act 11  . OMEPRAZOLE 20 MG PO CPDR Oral Take 1 capsule (20 mg total) by mouth daily. 30 capsule 6  . PHENOBARBITAL 100 MG PO TABS Oral Take 1 tablet (100 mg total) by mouth daily. for seizure prevention 31 tablet 5  .  SIMVASTATIN 80 MG PO TABS Oral Take 0.5 tablets (40 mg total) by mouth at bedtime. 30 tablet 3  . IPRATROPIUM-ALBUTEROL 18-103 MCG/ACT IN AERO Inhalation Inhale 2 puffs into the lungs every 4 (four) hours as needed. for SOB, wheezing or cough 1 Inhaler 12    BP 117/73  Pulse 73  Temp(Src) 98.1 F (36.7 C) (Oral)  Resp 18  SpO2 100%  Physical Exam  Nursing note and vitals reviewed. Constitutional: She is oriented to person, place, and time. She appears well-developed and well-nourished. No distress.  Eyes: Conjunctivae are normal. No scleral icterus.  Neck: Neck supple. No tracheal deviation present.  Cardiovascular: Normal rate, regular rhythm, normal heart sounds and intact distal pulses.   Pulmonary/Chest: Effort normal and breath sounds normal. No respiratory distress.  Abdominal: Soft.  Normal appearance. She exhibits no distension. There is no tenderness.  Musculoskeletal:       +diffuse mild swelling left leg from just above knee to ankle. Distal pulses palp. Knee stable. ? effusion. Good passive rom at hip, knee and ankle without pain. No skin changes or erythema. Specifically, knee without increased warmth or erythema, no pain w passive rom.   No focal bony tenderness, or focal sts. Compartments soft, not tense.   Neurological: She is alert and oriented to person, place, and time.       Motor intact bil.   Skin: Skin is warm and dry. No rash noted.  Psychiatric: She has a normal mood and affect.    ED Course  Procedures (including critical care time)  Dg Knee Complete 4 Views Left  12/24/2011  *RADIOLOGY REPORT*  Clinical Data: Left knee pain  LEFT KNEE - COMPLETE 4+ VIEW  Comparison: None.  Findings: No acute fracture and no dislocation.  Moderate joint effusion.  Mild degenerative change.  IMPRESSION: No acute bony pathology. Moderate joint effusion.  Original Report Authenticated By: Donavan Burnet, M.D.      MDM  Vascular dopplers.   Dopplers neg for dvt, however ?bakers cyst. xrays degen changes/eff.   Discussed xrays and doppler results w pt. rx pain, discussed pcp follow up.      Suzi Roots, MD 12/24/11 [phone]

## 2011-12-24 NOTE — ED Notes (Signed)
Patient states pain left lower extremity from anterior left knee radiating to behind knee thigh and calf.  Pain 8/10 achy dull pain.  Surgery left knee ten years ago.  Bilateral lower extremity full sensation equal skin warm bilaterally pedal pulses +1 bilaterally. Denies any trauma.  Ax4

## 2011-12-31 ENCOUNTER — Ambulatory Visit (INDEPENDENT_AMBULATORY_CARE_PROVIDER_SITE_OTHER): Payer: Medicaid Other | Admitting: Family Medicine

## 2011-12-31 ENCOUNTER — Encounter: Payer: Self-pay | Admitting: Family Medicine

## 2011-12-31 VITALS — BP 118/80 | Temp 98.2°F | Ht 66.0 in | Wt 201.0 lb

## 2011-12-31 DIAGNOSIS — M25562 Pain in left knee: Secondary | ICD-10-CM | POA: Insufficient documentation

## 2011-12-31 DIAGNOSIS — M25569 Pain in unspecified knee: Secondary | ICD-10-CM

## 2011-12-31 MED ORDER — HYDROCODONE-ACETAMINOPHEN 5-500 MG PO TABS: 1.0000 | ORAL_TABLET | Freq: Four times a day (QID) | ORAL | Status: AC | PRN

## 2011-12-31 NOTE — Assessment & Plan Note (Signed)
Evidence of Baker's cyst and effusion.  This is most likely osteoarthritis. Gouty arthritis is also a possibility.  Around 30 mL of fluid were removed from the knee.  The fluid was serous sanguinous.  I also injected Marcaine and Kenalog, which should help with pain.  Plan to send the fluid for culture cell count and crystal analysis.  Plan to followup in one to 2 weeks or sooner if needed. Additionally prescribing a limited number of hydrocodone for pain.

## 2011-12-31 NOTE — Patient Instructions (Signed)
Thank you for coming in today. We will call with results.  Take the hydrocodone as needed.  See me in 2 weeks.  If you have redness with worsening swelling and pain call immediately.  You should start feeling better in a day or two.

## 2011-12-31 NOTE — Progress Notes (Signed)
Addended by: [place], Ziyonna Christner on: 12/31/2011 04:40 PM   Modules accepted: Orders

## 2011-12-31 NOTE — Progress Notes (Signed)
Addended by: Herminio Heads on: 12/31/2011 04:31 PM   Modules accepted: Orders

## 2011-12-31 NOTE — Progress Notes (Signed)
[name] is a 61 y.o. female who presents to [facility] today for left knee pain.  On March 4 he awoke with left knee pain and leg swelling and fell to the floor. She was evaluated in the emergency room with x-ray and Doppler ultrasound of the venous system.  She was determined to have a Bakers cyst and knee effusion.  She was prescribed hydrocodone and asked to followup with her primary doctor.  She continues to experience pain and has run out of hydrocodone.  No other pain medicines work.  She notes pain with range of motion and ambulation , and is using a cane to ambulate .  She denies any fevers or chills or sweats.   PMH reviewed.  significant for arthritis  ROS as above otherwise neg Medications reviewed. Current Outpatient Prescriptions  Medication Sig Dispense Refill  . albuterol-ipratropium (COMBIVENT) 18-103 MCG/ACT inhaler Inhale 2 puffs into the lungs every 4 (four) hours as needed. for SOB, wheezing or cough  1 Inhaler  12  . amLODipine (NORVASC) 10 MG tablet Take 1 tablet (10 mg total) by mouth daily.  30 tablet  6  . carvedilol (COREG) 3.125 MG tablet Take 1 tablet (3.125 mg total) by mouth 2 (two) times daily with a meal.  62 tablet  3  . citalopram (CELEXA) 20 MG tablet Take 1 tablet (20 mg total) by mouth at bedtime.  31 tablet  6  . fluticasone (FLONASE) 50 MCG/ACT nasal spray USE 2 SPRAYS IN EACH NOSTRIL ONCE A DAY  1 Act  11  . HYDROcodone-acetaminophen (VICODIN) 5-500 MG per tablet Take 1-2 tablets by mouth every 6 (six) hours as needed for pain.  40 tablet  0  . omeprazole (PRILOSEC) 20 MG capsule Take 1 capsule (20 mg total) by mouth daily.  30 capsule  6  . PHENObarbital (LUMINAL) 100 MG tablet Take 1 tablet (100 mg total) by mouth daily. for seizure prevention  31 tablet  5  . simvastatin (ZOCOR) 80 MG tablet Take 0.5 tablets (40 mg total) by mouth at bedtime.  30 tablet  3    Exam:  BP 118/80  Temp(Src) 98.2 F (36.8 C) (Oral)  Ht 5\' 6"  (1.676 m)  Wt 201 lb (91.173  kg)  BMI 32.44 kg/m2 Gen: Well NAD in pain appearing MSK: Left leg swollen.  Moderate knee effusion with a large Baker's cyst.  No erythema.  Pain on palpation of the patella.  Pain with range of motion.  Limited range of motion to extension and flexion.  Unable to tolerate Lachman's or valgus or varus stress however ligaments to feel intact to limited exam.   Procedure aspiration and injection of left knee.  Consent obtained and timeout performed.  Lateral suprapatellar space identified and marked.  Area cleaned with Betadine and sterile drapes applied.  Using a 25-gauge 1.5 inch needle the planned injection track was anesthetized to the suprapatellar space.  Then after 3 minute weight 18-gauge 1-1/2 inch needle was inserted into the lateral suprapatellar space and 30 mL of serosanguineous fluid removed.  Then the syringe was changed and 4 mL of 0.25% bupivacaine and 1 mL of 40 mg/mL Kenalog was injected into the knee with no resistance.  The needle was removed and a sterile gauze was applied.  Knee cleaned with alcohol and a Band-Aid applied.  No bleeding. Patient tolerated the procedure well with moderate pain.

## 2012-01-01 LAB — SYNOVIAL CELL COUNT + DIFF, W/ CRYSTALS
Lymphocytes-Synovial Fld: 7 % (ref 0–20)
Monocyte/Macrophage: 16 % — ABNORMAL LOW (ref 50–90)
Neutrophil, Synovial: 77 % — ABNORMAL HIGH (ref 0–25)
WBC, Synovial: 5265 cu mm — ABNORMAL HIGH (ref 0–200)

## 2012-01-02 ENCOUNTER — Telehealth: Payer: Self-pay | Admitting: Family Medicine

## 2012-01-02 NOTE — Telephone Encounter (Signed)
Called about test results. Pt will f/u in a few days.

## 2012-01-15 ENCOUNTER — Ambulatory Visit (INDEPENDENT_AMBULATORY_CARE_PROVIDER_SITE_OTHER): Payer: Medicaid Other | Admitting: Family Medicine

## 2012-01-15 ENCOUNTER — Encounter: Payer: Self-pay | Admitting: Family Medicine

## 2012-01-15 VITALS — BP 140/90 | HR 91 | Ht 66.0 in | Wt 201.6 lb

## 2012-01-15 DIAGNOSIS — Z9181 History of falling: Secondary | ICD-10-CM

## 2012-01-15 DIAGNOSIS — M25569 Pain in unspecified knee: Secondary | ICD-10-CM

## 2012-01-15 DIAGNOSIS — R296 Repeated falls: Secondary | ICD-10-CM

## 2012-01-15 DIAGNOSIS — M25562 Pain in left knee: Secondary | ICD-10-CM

## 2012-01-15 MED ORDER — HYDROCODONE-ACETAMINOPHEN 5-500 MG PO TABS: 1.0000 | ORAL_TABLET | Freq: Three times a day (TID) | ORAL | Status: DC | PRN

## 2012-01-15 NOTE — Assessment & Plan Note (Signed)
Check phenobarbital level today

## 2012-01-15 NOTE — Patient Instructions (Signed)
Thank you for coming in today. Come back in 1 week.  Stop simvastatin today.  Use the pain medicine as needed.  I will call you tomorrow with the test results.  Go to the ER if you have worsening pain or lots of falls.

## 2012-01-15 NOTE — Assessment & Plan Note (Signed)
Knee and posterior leg pain, did not improve with steroid injection and knee synovial fluid analysis was negative.  I am beginning to suspect that her pain may be due to myalgias.  She is on simvastatin.  Plan to check a CK, basic metabolic panel, CBC, ESR.  I will follow patient up in one week.  I will refer to physical therapy for gait analysis and use of walker if CK is normal.  Additionally we'll refer to sports medicine if no improvement for ultrasound of her left leg is CK is normal.  His CKs abnormal will continue stopping simvastatin in followup in one week.  Discussed warning signs. Patient expresses understanding. Refill Vicodin today.

## 2012-01-15 NOTE — Progress Notes (Signed)
[name] is a 61 y.o. female who presents to [facility] today for followup her recent left leg pain.  She has had several falls recently and developed left knee pain March 4th, he was evaluated in the emergency room with x-rays and duplex ultrasound of her leg.  She was seen by me recently and had fluid removed from her knee and steroids were injected.  She notes continued leg pain, weakness and some falls.  She notes her posterior knee and distal posterior thigh are the most tender areas.  She says that her legs tend to give out. She notes occasional dizziness as well.  She uses a cane to ambulate.  She was prescribed Vicodin at the last visit which has helped her pain.  She denies any fevers or chills or trouble breathing.    PMH, SH reviewed: Significant for hypertension and hyperlipidemia on simvastatin and seizure disorder on phenobarbital ROS as above otherwise neg. No Chest pain, palpitations, SOB, Fever, Chills, Abd pain, N/V/D.  Medications reviewed. Current Outpatient Prescriptions  Medication Sig Dispense Refill  . albuterol-ipratropium (COMBIVENT) 18-103 MCG/ACT inhaler Inhale 2 puffs into the lungs every 4 (four) hours as needed. for SOB, wheezing or cough  1 Inhaler  12  . amLODipine (NORVASC) 10 MG tablet Take 1 tablet (10 mg total) by mouth daily.  30 tablet  6  . carvedilol (COREG) 3.125 MG tablet Take 1 tablet (3.125 mg total) by mouth 2 (two) times daily with a meal.  62 tablet  3  . citalopram (CELEXA) 20 MG tablet Take 1 tablet (20 mg total) by mouth at bedtime.  31 tablet  6  . fluticasone (FLONASE) 50 MCG/ACT nasal spray USE 2 SPRAYS IN EACH NOSTRIL ONCE A DAY  1 Act  11  . omeprazole (PRILOSEC) 20 MG capsule Take 1 capsule (20 mg total) by mouth daily.  30 capsule  6  . PHENObarbital (LUMINAL) 100 MG tablet Take 1 tablet (100 mg total) by mouth daily. for seizure prevention  31 tablet  5  . simvastatin (ZOCOR) 80 MG tablet Take 0.5 tablets (40 mg total) by mouth at bedtime.  30  tablet  3  . HYDROcodone-acetaminophen (VICODIN) 5-500 MG per tablet Take 1 tablet by mouth every 8 (eight) hours as needed for pain.  40 tablet  0    Exam:  BP 140/90  Pulse 91  Ht 5\' 6"  (1.676 m)  Wt 201 lb 9.6 oz (91.445 kg)  BMI 32.54 kg/m2 Gen: Well NAD Lungs: CTABL Nl WOB Heart: RRR no MRG Abd: NABS, NT, ND MSK: Left leg normal-appearing.  Less effusion the last week.  Pain with range of motion of the knee. Small Baker cyst present. Posterior thigh is tender to palpation, but no rashes present.    Neuro: Alert and oriented cranial nerves 2-12 are intact.  Patient is unsteady when she stands up but is able to ambulate using a cane.

## 2012-01-16 LAB — CBC WITH DIFFERENTIAL/PLATELET
Basophils Relative: 0 % (ref 0–1)
Eosinophils Absolute: 0.2 10*3/uL (ref 0.0–0.7)
Hemoglobin: 11.2 g/dL — ABNORMAL LOW (ref 12.0–15.0)
MCH: 26.8 pg (ref 26.0–34.0)
MCHC: 31.4 g/dL (ref 30.0–36.0)
Monocytes Relative: 11 % (ref 3–12)
Neutrophils Relative %: 57 % (ref 43–77)
RDW: 16.8 % — ABNORMAL HIGH (ref 11.5–15.5)

## 2012-01-16 LAB — BASIC METABOLIC PANEL
BUN: 16 mg/dL (ref 6–23)
CO2: 29 mEq/L (ref 19–32)
Calcium: 9.1 mg/dL (ref 8.4–10.5)
Creat: 0.64 mg/dL (ref 0.50–1.10)
Glucose, Bld: 87 mg/dL (ref 70–99)
Sodium: 141 mEq/L (ref 135–145)

## 2012-01-16 LAB — PHENOBARBITAL LEVEL: Phenobarbital: 25.3 ug/mL (ref 15.0–40.0)

## 2012-01-25 ENCOUNTER — Ambulatory Visit (INDEPENDENT_AMBULATORY_CARE_PROVIDER_SITE_OTHER): Payer: Medicaid Other | Admitting: Family Medicine

## 2012-01-25 ENCOUNTER — Encounter: Payer: Self-pay | Admitting: Family Medicine

## 2012-01-25 VITALS — BP 139/93 | HR 83 | Ht 66.0 in | Wt 204.4 lb

## 2012-01-25 DIAGNOSIS — M25562 Pain in left knee: Secondary | ICD-10-CM

## 2012-01-25 DIAGNOSIS — R569 Unspecified convulsions: Secondary | ICD-10-CM

## 2012-01-25 DIAGNOSIS — M79609 Pain in unspecified limb: Secondary | ICD-10-CM

## 2012-01-25 DIAGNOSIS — M79605 Pain in left leg: Secondary | ICD-10-CM | POA: Insufficient documentation

## 2012-01-25 DIAGNOSIS — M25569 Pain in unspecified knee: Secondary | ICD-10-CM

## 2012-01-25 MED ORDER — HYDROCODONE-ACETAMINOPHEN 5-500 MG PO TABS: 1.0000 | ORAL_TABLET | Freq: Three times a day (TID) | ORAL | Status: AC | PRN

## 2012-01-25 NOTE — Patient Instructions (Addendum)
Thank you for coming in today. We will do a xray of your back.  Please see the sports Medicine doctors [phone]. Call and make an appointment.  See me in 1 month or sooner if needed.  Please see a neurologist about your seizures.  Continue your medicine.  Let me know if you continue to have seizures.

## 2012-01-27 ENCOUNTER — Encounter: Payer: Self-pay | Admitting: Family Medicine

## 2012-01-27 NOTE — Progress Notes (Signed)
[name] is a 61 y.o. female who presents to [facility] today for followup left knee pain. Continue to have pain of the posterior upper left leg and left knee. Denies any pain radiating down to her toes. Does note the knee feels unstable and she has fallen several times. Denies any numbness to her leg. She notes a painful Baker's cyst. This pain has been present for several weeks initially was evaluated in the emergency room with a Doppler exam of the leg but only showed a Baker cyst and failed to show a clot.   Additionally she's had an aspiration and injection of the knee which was nondiagnostic and failed to improve her pain.  [name] does have lower back pain and is wearing a brace.  2 days ago she developed a seizure that lasted around 10 minutes and was witnessed by her family. The patient does not remember the seizure. She's been compliant on phenobarbital for quite some time now and had a normal level when checked at the last visit.  It has been several years since her last neurological visit.     PMH, SH reviewed: Significant for seizure disorder and left knee pain ROS as above otherwise neg. No Chest pain, palpitations, SOB, Fever, Chills, Abd pain, N/V/D.  Medications reviewed. Current Outpatient Prescriptions  Medication Sig Dispense Refill  . albuterol-ipratropium (COMBIVENT) 18-103 MCG/ACT inhaler Inhale 2 puffs into the lungs every 4 (four) hours as needed. for SOB, wheezing or cough  1 Inhaler  12  . amLODipine (NORVASC) 10 MG tablet Take 1 tablet (10 mg total) by mouth daily.  30 tablet  6  . carvedilol (COREG) 3.125 MG tablet Take 1 tablet (3.125 mg total) by mouth 2 (two) times daily with a meal.  62 tablet  3  . citalopram (CELEXA) 20 MG tablet Take 1 tablet (20 mg total) by mouth at bedtime.  31 tablet  6  . fluticasone (FLONASE) 50 MCG/ACT nasal spray USE 2 SPRAYS IN EACH NOSTRIL ONCE A DAY  1 Act  11  . HYDROcodone-acetaminophen (VICODIN) 5-500 MG per tablet Take 1 tablet  by mouth every 8 (eight) hours as needed for pain.  40 tablet  0  . omeprazole (PRILOSEC) 20 MG capsule Take 1 capsule (20 mg total) by mouth daily.  30 capsule  6  . PHENObarbital (LUMINAL) 100 MG tablet Take 1 tablet (100 mg total) by mouth daily. for seizure prevention  31 tablet  5  . simvastatin (ZOCOR) 80 MG tablet Take 0.5 tablets (40 mg total) by mouth at bedtime.  30 tablet  3    Exam:  BP 139/93  Pulse 83  Ht 5\' 6"  (1.676 m)  Wt 204 lb 6.4 oz (92.715 kg)  BMI 32.99 kg/m2 Gen: Well NAD Lungs: CTABL Nl WOB Heart: RRR no MRG Abd: NABS, NT, ND Exts: Non edematous BL  LE, warm and well perfused.  MSK: Left leg normal-appearing. Less effusion the last week. Pain with range of motion of the knee. Small Baker cyst present. Posterior thigh is tender to palpation, but no rashes present.  Neuro: Alert and oriented cranial nerves 2-12 are intact. Patient is unsteady when she stands up but is able to ambulate using a cane Back: Wearing a lumbar support brace.  Tender to palpation along the lumbar midline.  Tender in the paraspinous muscles and SI joint on the left side  Labs from March 26: Normal white blood cell count Erythrocyte sedimentation rate is 40 Phenobarbital level is 25.3  Creatinine kinase is 32

## 2012-01-28 NOTE — Assessment & Plan Note (Addendum)
I am not sure of the etiology of her pain.  It's possible this is myalgia or radicular pain.   She does have midline back tenderness.  Plan to obtain a lumbar x-ray and refer patient to [facility] for possible ultrasound of left knee with potential drainage of left Baker's cyst.  She may eventually require further rheumatologic workup or further advanced imaging of her lumbar spine area will followup in my clinic soon. Discussed warning signs or symptoms with patient who expresses understanding.

## 2012-02-04 ENCOUNTER — Ambulatory Visit
Admission: RE | Admit: 2012-02-04 | Discharge: 2012-02-04 | Disposition: A | Discharge status: Home / Self Care | Payer: Medicaid Other | Source: Ambulatory Visit | Attending: Family Medicine | Admitting: Family Medicine

## 2012-02-04 DIAGNOSIS — M79605 Pain in left leg: Secondary | ICD-10-CM

## 2012-02-11 ENCOUNTER — Other Ambulatory Visit: Payer: Self-pay | Admitting: Family Medicine

## 2012-02-11 NOTE — Telephone Encounter (Signed)
Patient needs refill of Hydrocodone for her leg and back pain.  Will route note to Dr. Denyse Amass and call patient back.  Gaylene Brooks, RN

## 2012-02-11 NOTE — Telephone Encounter (Signed)
Pt is asking for more pain meds  - she has been taking 2 at night - has a f/u appt on 5/8 [facility]

## 2012-02-12 ENCOUNTER — Other Ambulatory Visit: Payer: Self-pay | Admitting: Family Medicine

## 2012-02-12 MED ORDER — HYDROCODONE-ACETAMINOPHEN 5-500 MG PO TABS: 1.0000 | ORAL_TABLET | Freq: Three times a day (TID) | ORAL | Status: AC | PRN

## 2012-02-12 NOTE — Telephone Encounter (Addendum)
Returned call to patient.  Informed patient that hydrodocone will be refilled for #20 tabs and med called to [facility].  Follow-up appt with Dr. Denyse Amass changed from 5/8 to 02/20/12 at 3:15pm.    Gaylene Brooks, RN

## 2012-02-12 NOTE — Telephone Encounter (Signed)
Pt will need an appointment with myself to continue following up this issue. I will call enough medicine in to last 1 week, but she needs an appointment with me or sports medicine sooner than May 8th. Writing phone in order for hydorocone and routing to [place].

## 2012-02-20 ENCOUNTER — Encounter: Payer: Self-pay | Admitting: Family Medicine

## 2012-02-20 ENCOUNTER — Ambulatory Visit (INDEPENDENT_AMBULATORY_CARE_PROVIDER_SITE_OTHER): Payer: Medicaid Other | Admitting: Family Medicine

## 2012-02-20 ENCOUNTER — Other Ambulatory Visit: Payer: Self-pay | Admitting: Family Medicine

## 2012-02-20 VITALS — BP 114/79 | HR 73 | Ht 66.0 in | Wt 200.0 lb

## 2012-02-20 DIAGNOSIS — M79605 Pain in left leg: Secondary | ICD-10-CM

## 2012-02-20 DIAGNOSIS — M79609 Pain in unspecified limb: Secondary | ICD-10-CM

## 2012-02-20 DIAGNOSIS — M542 Cervicalgia: Secondary | ICD-10-CM

## 2012-02-20 NOTE — Patient Instructions (Signed)
Thank you for coming in today. Please have your knee surgery in 1 week.  See me about 1 month afterwards.  Come back or go to the emergency room if you notice new weakness new numbness problems walking or bowel or bladder problems.

## 2012-02-21 ENCOUNTER — Encounter: Payer: Self-pay | Admitting: Family Medicine

## 2012-02-21 DIAGNOSIS — M542 Cervicalgia: Secondary | ICD-10-CM | POA: Insufficient documentation

## 2012-02-21 HISTORY — DX: Cervicalgia: M54.2

## 2012-02-21 NOTE — Assessment & Plan Note (Signed)
Paraspinal neck pain without any weakness numbness loss of coordination or decreased reflexes. Pain is not on the midline. I don't see any reason for x-rays here and this appears to be normal muscular pain. Patient is already on significant amounts of pain medicine for her knee pain. Plan to use that medicine to treat her neck and followup in about one month. Advised warning signs or symptoms and recommended follow up sooner if not improving.

## 2012-02-21 NOTE — Progress Notes (Signed)
[name] is a 61 y.o. female who presents to [facility] today for followup leg pain. She additionally like to discuss her back pain. 1) leg pain: Located in the left knee. She has arthroscopic surgery planned via her orthopedic surgeon in one week. She continues to experience pain but is taking pain medication such as hydrocodone for this. She denies any fevers or chills  2) cervical neck pain. Patient notes pain on the bilateral lateral aspects of her cervical spine. She denies any radiation weakness or numbness difficulty walking aside from her knee pain and bowel or bladder dysfunction.  She is taking her medications she is already taking for her knee pain for her back and it helps some.   PMH, SH reviewed: Significant for hypertension and seizure disorder ROS as above  Medications reviewed. Current Outpatient Prescriptions  Medication Sig Dispense Refill  . albuterol-ipratropium (COMBIVENT) 18-103 MCG/ACT inhaler Inhale 2 puffs into the lungs every 4 (four) hours as needed. for SOB, wheezing or cough  1 Inhaler  12  . amLODipine (NORVASC) 10 MG tablet Take 1 tablet (10 mg total) by mouth daily.  30 tablet  6  . carvedilol (COREG) 3.125 MG tablet Take 1 tablet (3.125 mg total) by mouth 2 (two) times daily with a meal.  62 tablet  3  . citalopram (CELEXA) 20 MG tablet Take 1 tablet (20 mg total) by mouth daily.  31 tablet  5  . fluticasone (FLONASE) 50 MCG/ACT nasal spray USE 2 SPRAYS IN EACH NOSTRIL ONCE A DAY  1 Act  11  . HYDROcodone-acetaminophen (VICODIN) 5-500 MG per tablet Take 1 tablet by mouth every 8 (eight) hours as needed for pain.  20 tablet  0  . omeprazole (PRILOSEC) 20 MG capsule Take 1 capsule (20 mg total) by mouth daily.  30 capsule  6  . PHENObarbital (LUMINAL) 100 MG tablet Take 1 tablet (100 mg total) by mouth daily. for seizure prevention  31 tablet  5  . simvastatin (ZOCOR) 80 MG tablet Take 0.5 tablets (40 mg total) by mouth at bedtime.  30 tablet  3    Exam:  BP  114/79  Pulse 73  Ht 5\' 6"  (1.676 m)  Wt 200 lb (90.719 kg)  BMI 32.28 kg/m2 Gen: Well NAD BACK: Nontender over spinal midline. Bilateral paraspinal tenderness to palpation. Normal neck range of motion. Normal sensation and strength and upper extremity his. Reflexes intact throughout. Left Knee: Tender in the posterior aspect. Difficulty with range of motion. Painful gait  No results found for this or any previous visit (from the past 72 hour(s)).

## 2012-02-21 NOTE — Assessment & Plan Note (Signed)
Patient has arthroscopic surgery planned in one week. We'll followup in approximately one month after surgery

## 2012-02-27 ENCOUNTER — Ambulatory Visit: Payer: Medicaid Other | Admitting: Family Medicine

## 2012-03-04 ENCOUNTER — Ambulatory Visit: Payer: Medicaid Other | Attending: Orthopedic Surgery

## 2012-03-06 ENCOUNTER — Other Ambulatory Visit: Payer: Self-pay | Admitting: Family Medicine

## 2012-03-06 MED ORDER — CARVEDILOL 3.125 MG PO TABS: 3.1250 mg | ORAL_TABLET | Freq: Two times a day (BID) | ORAL | Status: DC

## 2012-03-25 ENCOUNTER — Other Ambulatory Visit: Payer: Self-pay | Admitting: Family Medicine

## 2012-03-25 MED ORDER — PHENOBARBITAL 100 MG PO TABS: 100.0000 mg | ORAL_TABLET | Freq: Every day | ORAL | Status: DC

## 2012-03-25 MED ORDER — IPRATROPIUM-ALBUTEROL 20-100 MCG/ACT IN AERS: 1.0000 | INHALATION_SPRAY | Freq: Four times a day (QID) | RESPIRATORY_TRACT | Status: DC | PRN

## 2012-04-22 ENCOUNTER — Other Ambulatory Visit: Payer: Self-pay | Admitting: Family Medicine

## 2012-06-03 ENCOUNTER — Emergency Department (HOSPITAL_COMMUNITY): Payer: Medicaid Other

## 2012-06-03 ENCOUNTER — Encounter (HOSPITAL_COMMUNITY): Payer: Self-pay | Admitting: *Deleted

## 2012-06-03 ENCOUNTER — Emergency Department (HOSPITAL_COMMUNITY)
Admission: EM | Admit: 2012-06-03 | Discharge: 2012-06-03 | Disposition: A | Discharge status: Home / Self Care | Payer: Medicaid Other | Attending: Emergency Medicine | Admitting: Emergency Medicine

## 2012-06-03 DIAGNOSIS — I10 Essential (primary) hypertension: Secondary | ICD-10-CM | POA: Insufficient documentation

## 2012-06-03 DIAGNOSIS — G8929 Other chronic pain: Secondary | ICD-10-CM | POA: Insufficient documentation

## 2012-06-03 DIAGNOSIS — J449 Chronic obstructive pulmonary disease, unspecified: Secondary | ICD-10-CM | POA: Insufficient documentation

## 2012-06-03 DIAGNOSIS — J4489 Other specified chronic obstructive pulmonary disease: Secondary | ICD-10-CM | POA: Insufficient documentation

## 2012-06-03 DIAGNOSIS — R05 Cough: Secondary | ICD-10-CM

## 2012-06-03 DIAGNOSIS — M549 Dorsalgia, unspecified: Secondary | ICD-10-CM | POA: Insufficient documentation

## 2012-06-03 DIAGNOSIS — K219 Gastro-esophageal reflux disease without esophagitis: Secondary | ICD-10-CM | POA: Insufficient documentation

## 2012-06-03 DIAGNOSIS — Z87891 Personal history of nicotine dependence: Secondary | ICD-10-CM | POA: Insufficient documentation

## 2012-06-03 DIAGNOSIS — E785 Hyperlipidemia, unspecified: Secondary | ICD-10-CM | POA: Insufficient documentation

## 2012-06-03 DIAGNOSIS — R059 Cough, unspecified: Secondary | ICD-10-CM | POA: Insufficient documentation

## 2012-06-03 DIAGNOSIS — Z8673 Personal history of transient ischemic attack (TIA), and cerebral infarction without residual deficits: Secondary | ICD-10-CM | POA: Insufficient documentation

## 2012-06-03 HISTORY — DX: Cerebral infarction, unspecified: I63.9

## 2012-06-03 LAB — CBC WITH DIFFERENTIAL/PLATELET
Hemoglobin: 11.3 g/dL — ABNORMAL LOW (ref 12.0–15.0)
Lymphocytes Relative: 30 % (ref 12–46)
Lymphs Abs: 1.8 10*3/uL (ref 0.7–4.0)
MCH: 26.3 pg (ref 26.0–34.0)
Monocytes Relative: 8 % (ref 3–12)
Neutro Abs: 3.5 10*3/uL (ref 1.7–7.7)
Neutrophils Relative %: 58 % (ref 43–77)
Platelets: 255 10*3/uL (ref 150–400)
RBC: 4.3 MIL/uL (ref 3.87–5.11)

## 2012-06-03 LAB — BASIC METABOLIC PANEL
BUN: 11 mg/dL (ref 6–23)
Chloride: 104 mEq/L (ref 96–112)
Glucose, Bld: 87 mg/dL (ref 70–99)
Potassium: 4 mEq/L (ref 3.5–5.1)

## 2012-06-03 MED ORDER — IBUPROFEN 400 MG PO TABS
800.0000 mg | ORAL_TABLET | Freq: Once | ORAL | Status: AC
  Administered 2012-06-03: 800 mg via ORAL
  Filled 2012-06-03: qty 2

## 2012-06-03 MED ORDER — ALBUTEROL SULFATE (5 MG/ML) 0.5% IN NEBU
5.0000 mg | INHALATION_SOLUTION | Freq: Once | RESPIRATORY_TRACT | Status: AC
  Administered 2012-06-03: 5 mg via RESPIRATORY_TRACT
  Filled 2012-06-03: qty 1

## 2012-06-03 NOTE — ED Notes (Signed)
Pt is here with chest pain with coughing.  Pt reports lower back pain.  Pt reports SOB.  Pt with some wheezing

## 2012-06-03 NOTE — ED Notes (Signed)
Wait time given 

## 2012-06-03 NOTE — ED Notes (Signed)
The patient is AOx4 and comfortable with her discharge instructions. 

## 2012-06-03 NOTE — ED Provider Notes (Signed)
History     CSN: [id number]  Arrival date & time 06/03/12  1102   First MD Initiated Contact with Patient 06/03/12 1811      Chief Complaint  Patient presents with  . Cough    with chest wall pain  . Back Pain    (Consider location/radiation/quality/duration/timing/severity/associated sxs/prior treatment) Patient is a 61 y.o. female presenting with cough and back pain. The history is provided by the patient. No language interpreter was used.  Cough This is a recurrent problem. The current episode started more than 2 days ago. The problem occurs hourly. The problem has not changed since onset.The cough is non-productive. There has been no fever. Associated symptoms include chest pain, rhinorrhea and sore throat. Pertinent negatives include no chills, no sweats, no weight loss, no ear congestion, no ear pain, no shortness of breath and no wheezing. She has tried decongestants for the symptoms. The treatment provided mild relief. She is not a smoker. Her past medical history is significant for bronchitis, pneumonia and COPD.  Back Pain  This is a chronic problem. The current episode started more than 2 days ago. The problem occurs daily. The problem has not changed since onset.Pain location: paraspinal lumbar. The quality of the pain is described as aching. The pain does not radiate. The pain is at a severity of 5/10. The pain is moderate. The symptoms are aggravated by bending, twisting and certain positions (cough). Associated symptoms include chest pain. Pertinent negatives include no fever and no weight loss. She has tried nothing for the symptoms.   61 year old female coming in with complaint of cough x5 days and bilateral lower back pain. States that she did have pneumonia in the past and she wanted to make sure she didn't have an today. She has been using her albuterol inhaler twice a day with some relief. States that the cough is worse when she lays down. pmh hypertension, pneumonia,  migraine, stroke, back pain, gerd, conversion disorder.  Past Medical History  Diagnosis Date  . Hypertension   . Hyperlipidemia   . 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  . COPD (chronic obstructive pulmonary disease)   . Depression   . Personality disorder   . Chronic low back pain   . Sleep apnea     Mild. No need for CPAP.  Marland Kitchen History of CVA (cerebrovascular accident)   . GERD (gastroesophageal reflux disease)   . Migraine   . Stroke     Past Surgical History  Procedure Date  . Appendectomy   . Shoulder surgery   . Back surgery   . Cardiac catheterization 2008    Normal. Dr Elease Hashimoto.  . Knee surgery     Bilateral.    Family History  Problem Relation Age of Onset  . Cirrhosis Mother   . Diabetes Mother   . Cirrhosis Father   . Cancer Sister   . Cancer Sister     breast    History  Substance Use Topics  . Smoking status: Former Games developer  . Smokeless tobacco: Former Neurosurgeon    Quit date: 11/03/2010  . Alcohol Use: No    OB History    Grav Para Term Preterm Abortions TAB SAB Ect Mult Living                  Review of Systems  Constitutional: Negative.  Negative for fever, chills and weight loss.  HENT: Positive for sore throat, rhinorrhea and postnasal drip. Negative  for ear pain, trouble swallowing, neck pain and voice change.   Eyes: Negative.   Respiratory: Positive for cough. Negative for shortness of breath and wheezing.   Cardiovascular: Positive for chest pain.       With cough  Gastrointestinal: Negative.  Negative for nausea and vomiting.  Musculoskeletal: Positive for back pain.  Neurological: Negative.   Psychiatric/Behavioral: Negative.   All other systems reviewed and are negative.    Allergies  Amoxicillin; Aspirin; Codeine phosphate; Penicillins; Prednisone; and Tetanus toxoids  Home Medications   Current Outpatient Rx  Name Route Sig Dispense Refill  . AMLODIPINE BESYLATE 10 MG PO TABS  TAKE 1 TABLET  BY MOUTH ONCE A DAY 30 tablet 6  . CARVEDILOL 3.125 MG PO TABS Oral Take 1 tablet (3.125 mg total) by mouth 2 (two) times daily with a meal. 62 tablet 3  . CITALOPRAM HYDROBROMIDE 20 MG PO TABS Oral Take 1 tablet (20 mg total) by mouth daily. 31 tablet 5  . FLUTICASONE PROPIONATE 50 MCG/ACT NA SUSP  USE 2 SPRAYS IN EACH NOSTRIL ONCE A DAY 1 Act 11  . IPRATROPIUM-ALBUTEROL 20-100 MCG/ACT IN AERS Inhalation Inhale 1 puff into the lungs every 6 (six) hours as needed for wheezing or shortness of breath. 1 Inhaler 12  . OMEPRAZOLE 20 MG PO CPDR  TAKE 1 CAPSULE BY MOUTH ONCE DAILY 30 capsule 3  . PHENOBARBITAL 100 MG PO TABS Oral Take 1 tablet (100 mg total) by mouth daily. for seizure prevention 31 tablet 5    BP 122/75  Pulse 71  Temp 97.7 F (36.5 C) (Oral)  Resp 14  SpO2 100%  Physical Exam  Nursing note and vitals reviewed. Constitutional: She is oriented to person, place, and time. She appears well-developed and well-nourished.  HENT:  Head: Normocephalic and atraumatic.  Right Ear: Tympanic membrane normal.  Left Ear: Tympanic membrane normal.  Nose: Nose normal.  Mouth/Throat: Uvula is midline and mucous membranes are normal. Posterior oropharyngeal erythema present. No oropharyngeal exudate, posterior oropharyngeal edema or tonsillar abscesses.  Eyes: Conjunctivae and EOM are normal. Pupils are equal, round, and reactive to light.  Neck: Normal range of motion. Neck supple.  Cardiovascular: Normal rate.   Pulmonary/Chest: Effort normal.  Abdominal: Soft.  Musculoskeletal: Normal range of motion. She exhibits no edema and no tenderness.  Neurological: She is alert and oriented to person, place, and time. She has normal reflexes.  Skin: Skin is warm and dry.  Psychiatric: She has a normal mood and affect.    ED Course  Procedures (including critical care time)  Labs Reviewed  CBC WITH DIFFERENTIAL - Abnormal; Notable for the following:    Hemoglobin 11.3 (*)     HCT 35.0  (*)     All other components within normal limits  BASIC METABOLIC PANEL   Dg Chest 2 View  06/03/2012  *RADIOLOGY REPORT*  Clinical Data: Cough with chest wall pain.  CHEST - 2 VIEW  Comparison: 09/09/2010  Findings: The cardiac silhouette is normal in size and configuration.  The aorta is mildly uncoiled.  No mediastinal or hilar mass or adenopathy.  Mild increased markings at the lung bases are stable.  The lungs otherwise clear.  No pleural effusion or pneumothorax.  Bony thorax is demineralized but intact.  IMPRESSION: No active disease of the chest.  Original Report Authenticated By: Domenic Moras, M.D.     No diagnosis found.    MDM  Cough and paraspinal pain x 4 days.  No  pneumonia on chest x-ray.  Better after ibuprofen and nebulizer in the ER. Labs unremarkable.  VSS.  Follow up with pcp this week.  Use inhaler no more than 4 times a day.  Ibuprofen and antihistamine x several days.     Labs Reviewed  CBC WITH DIFFERENTIAL - Abnormal; Notable for the following:    Hemoglobin 11.3 (*)     HCT 35.0 (*)     All other components within normal limits  BASIC METABOLIC PANEL  LAB REPORT - SCANNED          Remi Haggard, NP 06/04/12 1531

## 2012-06-07 NOTE — ED Provider Notes (Signed)
Medical screening examination/treatment/procedure(s) were performed by non-physician practitioner and as supervising physician I was immediately available for consultation/collaboration.   Geron Mulford, MD 06/07/12 0651 

## 2012-06-10 ENCOUNTER — Telehealth: Payer: Self-pay | Admitting: Family Medicine

## 2012-06-10 NOTE — Telephone Encounter (Signed)
Patient has appointment on 06/18/12 with PCP.Busick, Rodena Medin

## 2012-06-10 NOTE — Telephone Encounter (Signed)
Patient needs a referral sent to [facility]. She has been seen there before but it has been 3 years and before appt can be made, the would need a new referral sent. (Left foot/toe issues)

## 2012-06-10 NOTE — Telephone Encounter (Signed)
Called pt and spoke with [name] (dtr). Advised to have pt schedule appt with [place] to see her new PCP Dr.Losq and we can evaluate pt and [facility], if needed. She agreed and will tell her mom.  Lorenda Hatchet, Renato Battles

## 2012-06-17 ENCOUNTER — Ambulatory Visit (INDEPENDENT_AMBULATORY_CARE_PROVIDER_SITE_OTHER): Payer: Medicaid Other | Admitting: Family Medicine

## 2012-06-17 ENCOUNTER — Encounter: Payer: Self-pay | Admitting: Family Medicine

## 2012-06-17 VITALS — BP 108/76 | HR 91 | Ht 66.0 in | Wt 200.0 lb

## 2012-06-17 DIAGNOSIS — IMO0002 Reserved for concepts with insufficient information to code with codable children: Secondary | ICD-10-CM | POA: Insufficient documentation

## 2012-06-17 DIAGNOSIS — G589 Mononeuropathy, unspecified: Secondary | ICD-10-CM

## 2012-06-17 DIAGNOSIS — R600 Localized edema: Secondary | ICD-10-CM

## 2012-06-17 DIAGNOSIS — R609 Edema, unspecified: Secondary | ICD-10-CM

## 2012-06-17 MED ORDER — GABAPENTIN 100 MG PO CAPS: 100.0000 mg | ORAL_CAPSULE | Freq: Three times a day (TID) | ORAL | Status: DC

## 2012-06-17 NOTE — Patient Instructions (Addendum)
  Start the new medicine called Gabapentin 1 pill three times daily. If you feel too drowsy from it, you can take 3 pills at bedtime. We will get an ultrasound to make sure you don't have a clot.  Also, you can follow up with your orthopedic doctor.  I'd like to see you back in 3 weeks.   Complex Regional Pain Syndrome Complex Regional Pain Syndrome (CRPS) is a nerve disorder that occurs at the site of an injury. It occurs especially after injuries from high-velocity impacts such as those from bullets or shrapnel. However, it may occur without apparent injury. The arms or legs are usually involved. SYMPTOMS  CRPS is a chronic condition characterized by:  Severe burning pain.   Changes in bone and skin.   Excessive sweating.   Tissue swelling.   Extreme sensitivity to touch.  One visible sign of CRPS near the site of injury is warm, shiny, red skin that later becomes cool and bluish. The pain that patients report is out of proportion to the severity of the injury. The pain gets worse, rather than better, over time. Eventually the joints become stiff from disuse and the skin, muscles, and bone atrophy. The symptoms of CRPS vary in severity and duration. The cause of CRPS is unknown. The disorder is unique in that it simultaneously affects the:  Nerves.   Skin.   Muscles.   Blood vessels.   Bones.  CRPS can strike at any age but is more common between the ages of 50 and 57. However, the number of CRPS cases among adolescents and young adults is increasing. CRPS is diagnosed primarily through observation of the symptoms. Some physicians use thermography to detect changes in body temperature that are common in CRPS. X-rays may also show changes in the bone.  TREATMENT  Treatments include:  Physicians use a variety of drugs to treat CRPS.   Elevation of the extremity and physical therapy are also used to treat CRPS.   Injection of a local anesthetic.   Applying brief pulses of  electricity to nerve endings under the skin (transcutaneous electrical stimulation or TENS).   In some cases, interruption of the affected portion of the sympathetic nervous system (surgical or chemical sympathectomy) is necessary to relieve pain. This involves cutting the nerve or nerves. Pain is destroyed almost instantly, but this treatment may also destroy other sensations.  PROGNOSIS Good progress can be made in treating CRPS if treatment is begun early. Ideally treatment should begin within 3 months of the first symptoms. Early treatment often results in remission. If treatment is delayed, the disorder can quickly spread to the entire limb, and changes in bone and muscle may become irreversible. In 50 percent of CRPS cases, pain persists longer than 6 months and sometimes for years. Document Released: 09/28/2002 Document Revised: 09/27/2011 Document Reviewed: 08/18/2008 [facility] Patient Information 2012 [place], [place].

## 2012-06-17 NOTE — Progress Notes (Signed)
Patient ID: [name], female   DOB: 30-Oct-1950, 61 y.o.   MRN: [id number] Patient ID: [name]    DOB: 1951-03-08, 61 y.o.   MRN: [id number] --- Subjective:  [name] is a 61 y.o.female with h/o HLD, HTN, depression who presents with left foot pain and swelling. - foot pain: present for 2 months. Started when she was diagnosed with BAker's cyst. Woke up one day with pain like never before and has had pain since. Cannot put weight on foot. Hurts to even touch it. Pain starts right above the knee, down to the toes. Tried walking and felt like knee was locked at some point. Pain Associated with swelling x3weeks. Tried tylenol recently. Also tried hydrocodone earlier in the course of the symptoms which helped only mildly.   ROS: see HPI Past Medical History: reviewed and updated medications and allergies. Social History: Tobacco: denies  Objective: Filed Vitals:   06/17/12 1335  BP: 108/76  Pulse: 91    Physical Examination:   General appearance - alert, well appearing, and in great distress when left leg is touched Chest - clear to auscultation, no wheezes, rales or rhonchi, symmetric air entry Heart - normal rate, regular rhythm, normal S1, S2, no murmurs, rubs, clicks or gallops Extremities - left leg non pitting edema below knee. Pain out of proportion to exam to light touch along leg and foot. Patient able to extend knee but has difficulty flexing greater than 90deg. Dorsalis pedis pulse present bilaterally.

## 2012-06-17 NOTE — Assessment & Plan Note (Addendum)
Given pain out of proportion to exam, likely complex regional pain syndrome. Will treat with gabapentin 100mg  tid and titrate up as needed. Will also obtain lower extremity doppler to rule out DVT in the setting of unilateral swelling.  Patient to return in 3 weeks. Also sending referral to podiatrist, Dr. Irving Shows at patient's request.

## 2012-06-18 ENCOUNTER — Ambulatory Visit (HOSPITAL_COMMUNITY)
Admission: RE | Admit: 2012-06-18 | Discharge: 2012-06-18 | Disposition: A | Discharge status: Home / Self Care | Payer: Medicaid Other | Source: Ambulatory Visit | Attending: Family Medicine | Admitting: Family Medicine

## 2012-06-18 ENCOUNTER — Telehealth: Payer: Self-pay | Admitting: *Deleted

## 2012-06-18 ENCOUNTER — Telehealth: Payer: Self-pay | Admitting: Family Medicine

## 2012-06-18 DIAGNOSIS — M7989 Other specified soft tissue disorders: Secondary | ICD-10-CM | POA: Insufficient documentation

## 2012-06-18 DIAGNOSIS — M79609 Pain in unspecified limb: Secondary | ICD-10-CM

## 2012-06-18 DIAGNOSIS — R600 Localized edema: Secondary | ICD-10-CM

## 2012-06-18 NOTE — Telephone Encounter (Signed)
Called and informed patient of appointment with [facility] for tomorrow at 2:45.[name], [name]

## 2012-06-18 NOTE — Progress Notes (Signed)
*  PRELIMINARY RESULTS* Vascular Ultrasound Left lower extremity venous duplex has been completed.  Preliminary findings: Left= no evidence of DVT or baker's cyst.   Attempted to call report to Dr Gwenlyn Saran office at [phone]. Left voicemail.  Farrel Demark, RDMS, RVT  06/18/2012, 11:53 AM

## 2012-06-18 NOTE — Telephone Encounter (Signed)
Patient negative for DVT in left leg.  Patient sent home.  Will route results to Dr. Gwenlyn Saran.  Gaylene Brooks, RN

## 2012-08-26 ENCOUNTER — Encounter: Payer: Self-pay | Admitting: Family Medicine

## 2012-08-26 ENCOUNTER — Ambulatory Visit (INDEPENDENT_AMBULATORY_CARE_PROVIDER_SITE_OTHER): Payer: Medicaid Other | Admitting: Family Medicine

## 2012-08-26 VITALS — BP 114/80 | HR 76 | Temp 98.2°F | Ht 66.0 in | Wt 198.0 lb

## 2012-08-26 DIAGNOSIS — R52 Pain, unspecified: Secondary | ICD-10-CM

## 2012-08-26 DIAGNOSIS — M79609 Pain in unspecified limb: Secondary | ICD-10-CM

## 2012-08-26 DIAGNOSIS — M79605 Pain in left leg: Secondary | ICD-10-CM

## 2012-08-26 DIAGNOSIS — R531 Weakness: Secondary | ICD-10-CM

## 2012-08-26 DIAGNOSIS — R5381 Other malaise: Secondary | ICD-10-CM

## 2012-08-26 LAB — CBC WITH DIFFERENTIAL/PLATELET
Eosinophils Relative: 2 % (ref 0–5)
HCT: 33.7 % — ABNORMAL LOW (ref 36.0–46.0)
Lymphocytes Relative: 31 % (ref 12–46)
Lymphs Abs: 2.6 10*3/uL (ref 0.7–4.0)
MCV: 81.2 fL (ref 78.0–100.0)
Monocytes Absolute: 0.6 10*3/uL (ref 0.1–1.0)
RBC: 4.15 MIL/uL (ref 3.87–5.11)
WBC: 8.2 10*3/uL (ref 4.0–10.5)

## 2012-08-26 MED ORDER — GABAPENTIN 300 MG PO CAPS: 300.0000 mg | ORAL_CAPSULE | Freq: Two times a day (BID) | ORAL | Status: DC

## 2012-08-26 NOTE — Progress Notes (Signed)
Patient ID: [name]    DOB: May 04, 1951, 61 y.o.   MRN: [id number] --- Subjective:  [name] is a 61 y.o.female who presents for follow up on leg pain. Was seen in August for left foot pain. Was prescribed gabapentin for possible complex regional pain syndrome.= and had [place] of lower extremity to rule out DVT which was normal.   Since then she has started experiencing pain in her right leg as well. Pain starts in mid leg, passes knee, up to distal thigh. In the last week, she has also been having pain on both arms. This is associated with inability to move arms above her head and do her hair. She has been takinggabapentin 300mg  daily which helped at the beginning but mor recently, it has not helped as much. This has been associated with inability to move her feet and legs when she stands up to walk. She reports tht it takes her a few minutes to be able to walk again.  - left foot pain: present since august. Associated with swelling. She has seen her podiatrist: Dr. Amil Amen who recently evaluated her and strongly recommended MRI for further eval. She is wearing a boot which has helped some.  ROS: see HPI Past Medical History: reviewed and updated medications and allergies. Social History: Tobacco:  Objective: Filed Vitals:   08/26/12 1426  BP: 114/80  Pulse: 76  Temp: 98.2 F (36.8 C)    Physical Examination:   General appearance - alert, in mild distress Chest - clear to auscultation, no wheezes, rales or rhonchi, symmetric air entry Heart - normal rate, regular rhythm, normal S1, S2, no murmurs, rubs, clicks or gallops Extremities - peripheral pulses normal, non pitting edema and swelling on left foot compared to right. Pain out of proportion to exam with light touch of both feet and legs. Pain is located between mid shin to distal thigh bilaterally. Pain out of proportion along bilateral arms.

## 2012-08-26 NOTE — Patient Instructions (Signed)
Increase the gabapentin to 300mg  twice a day.  We are checking lab work. I'd like to see you back in 2 weeks.

## 2012-08-27 LAB — COMPLETE METABOLIC PANEL WITH GFR
ALT: 19 U/L (ref 0–35)
BUN: 15 mg/dL (ref 6–23)
CO2: 28 mEq/L (ref 19–32)
Calcium: 9.1 mg/dL (ref 8.4–10.5)
Chloride: 106 mEq/L (ref 96–112)
Creat: 0.76 mg/dL (ref 0.50–1.10)
GFR, Est African American: >89 mL/min

## 2012-08-30 DIAGNOSIS — R52 Pain, unspecified: Secondary | ICD-10-CM | POA: Insufficient documentation

## 2012-08-30 NOTE — Assessment & Plan Note (Addendum)
Progressively worsening pain from lower extremities now to upper extremities. Originally thought to be complex regional pain syndrome, however given extent of symptoms, cannot rule out rheumatoid etiology. Moreover, with symptoms of stiffness and inability to bring her arms above her head, differential includes polymyalgia rheumatica vs rheumatoid arthritis. Will obtain ESR and RF as well as CMP. Follow up in 2 weeks.  Increase to gabapentin 300mg  bid.

## 2012-08-30 NOTE — Assessment & Plan Note (Signed)
DVT ruled out with ultrasound at last visit when patient initially presented. Patient has appointment with podiatrist for MRI. Will follow up on this.

## 2012-09-05 ENCOUNTER — Telehealth: Payer: Self-pay | Admitting: Family Medicine

## 2012-09-05 ENCOUNTER — Encounter: Payer: Self-pay | Admitting: Family Medicine

## 2012-09-05 DIAGNOSIS — R52 Pain, unspecified: Secondary | ICD-10-CM

## 2012-09-05 NOTE — Telephone Encounter (Signed)
Called patient and informed her of her elevated sed rate and RF and recommended that we obtain a rheumatology consult. She reports that she continues having left leg swelling and pain and that she is trying to get an MRI approved, which was ordered by her podiatrist.  She also reports that she has been having a cough and feeling like she had a cold.  I recommended that she be seen at urgent care over the weekend if her cold symptoms got worts as well as if her foot pain got worst. She expressed understanding.

## 2012-09-08 ENCOUNTER — Other Ambulatory Visit: Payer: Self-pay | Admitting: Podiatrist

## 2012-09-08 DIAGNOSIS — M25572 Pain in left ankle and joints of left foot: Secondary | ICD-10-CM

## 2012-09-10 ENCOUNTER — Other Ambulatory Visit: Payer: Medicaid Other

## 2012-09-15 ENCOUNTER — Ambulatory Visit (INDEPENDENT_AMBULATORY_CARE_PROVIDER_SITE_OTHER): Payer: Medicaid Other | Admitting: Family Medicine

## 2012-09-15 VITALS — BP 111/78 | HR 78 | Temp 98.0°F | Ht 66.0 in | Wt 199.0 lb

## 2012-09-15 DIAGNOSIS — M79609 Pain in unspecified limb: Secondary | ICD-10-CM

## 2012-09-15 DIAGNOSIS — G8929 Other chronic pain: Secondary | ICD-10-CM

## 2012-09-15 DIAGNOSIS — R52 Pain, unspecified: Secondary | ICD-10-CM

## 2012-09-15 DIAGNOSIS — M79605 Pain in left leg: Secondary | ICD-10-CM

## 2012-09-15 MED ORDER — HYDROCODONE-ACETAMINOPHEN 5-500 MG PO TABS: 1.0000 | ORAL_TABLET | Freq: Every day | ORAL | Status: DC | PRN

## 2012-09-15 MED ORDER — GABAPENTIN 300 MG PO CAPS: 900.0000 mg | ORAL_CAPSULE | Freq: Two times a day (BID) | ORAL | Status: DC

## 2012-09-15 MED ORDER — HYDROCODONE-ACETAMINOPHEN 5-325 MG PO TABS: 1.0000 | ORAL_TABLET | Freq: Once | ORAL | Status: AC | PRN

## 2012-09-15 MED ORDER — GUAIFENESIN 400 MG PO TABS: 400.0000 mg | ORAL_TABLET | ORAL | Status: DC

## 2012-09-15 MED ORDER — PHENOBARBITAL 100 MG PO TABS: 100.0000 mg | ORAL_TABLET | Freq: Every day | ORAL | Status: DC

## 2012-09-15 NOTE — Patient Instructions (Signed)
For the foot pain, we're getting an xray to make sure that there isn't any break. Increase the gabapentin to 900mg  twice a day and take the vicodin once a day as needed.  We'll see what the rheumatologist says.   For the cough, you can take guaifenesin to help break down the secretions.

## 2012-09-15 NOTE — Assessment & Plan Note (Signed)
Pain not improved with gabapentin 600mg  bid. Increased to 900mg  bid for now. LAb results showing elevated Rheumatoid factor and mildly elevated sed rate for age. Cannot rule out rheumatologic etiology at this point and have referred patient to rheumatology with whom she has appointment on December 10th.

## 2012-09-15 NOTE — Progress Notes (Signed)
Patient ID: [name]    DOB: [birth year], 61 y.o.   MRN: [id number] --- Subjective:  [name] is a 61 y.o.female who presents for follow up on left leg and swelling.  - leg swelling and pain: has been present since August and pain has worsened although swelling has remained stable. PAtient states that she has taken the gabapentin 600mg  bid without any relief. She also states that she has not been able to sleep because of the pain. She localizes the pain to her entire foot. She has also been having some right leg pain, bilateral arm and back pain. She describes her arm and leg pain as "someone walking on her and not letting go of pressure". She describes her back pain as bilateral on either side, as someone kicking her. No bowel or urinary incontinence, no new associated lower leg weakness associated with the back pain.  She has not been able to get the MRI prescribed by her podiatrist because Medicaid has not yet approved it.    ROS: see HPI Past Medical History: reviewed and updated medications and allergies. Social History: Tobacco: denies  Objective: Filed Vitals:   09/15/12 1400  BP: 111/78  Pulse: 78  Temp: 98 F (36.7 C)    Physical Examination:   General appearance - alert, appears distress from pain Chest - clear to auscultation, no wheezes, rales or rhonchi, symmetric air entry Heart - normal rate, regular rhythm, normal S1, S2, no murmurs, rubs, clicks or gallops Left leg: non pitting edema along foot and leg, unchanged from previous exam, no warmth, no erythema, no ulcer, no skin break, pain out of proportion to exam with light touching.  Right leg: no edema, pain out of proportion to exam with touching of foot.  Similar type of pain with palpation of forearms.  Back - tenderness to palpation along right and left lumbar paraspinal muscles.

## 2012-09-15 NOTE — Assessment & Plan Note (Addendum)
Persistent pain with swelling. No evidence of infection on exam. US obtained in august not showing any DVT. No recent changes making DVT more likely and warranting another [place] at this time. WIll obtain plain Xray to rule out any fracture. Start vicodin 5/500mg  for pain control. Patient has been on this in the past for pain. Unclear etiology.

## 2012-09-29 ENCOUNTER — Other Ambulatory Visit: Payer: Self-pay | Admitting: *Deleted

## 2012-09-29 MED ORDER — CITALOPRAM HYDROBROMIDE 20 MG PO TABS: 20.0000 mg | ORAL_TABLET | Freq: Every day | ORAL | Status: DC

## 2012-09-30 ENCOUNTER — Other Ambulatory Visit: Payer: Self-pay | Admitting: Family Medicine

## 2012-09-30 MED ORDER — PHENOBARBITAL 100 MG PO TABS: 100.0000 mg | ORAL_TABLET | Freq: Every day | ORAL | Status: DC

## 2012-10-09 ENCOUNTER — Other Ambulatory Visit: Payer: Self-pay | Admitting: Family Medicine

## 2012-10-20 ENCOUNTER — Ambulatory Visit
Admission: RE | Admit: 2012-10-20 | Discharge: 2012-10-20 | Disposition: A | Discharge status: Home / Self Care | Payer: Medicaid Other | Source: Ambulatory Visit | Attending: Rheumatology | Admitting: Rheumatology

## 2012-10-20 ENCOUNTER — Other Ambulatory Visit: Payer: Self-pay | Admitting: Rheumatology

## 2012-10-20 DIAGNOSIS — M542 Cervicalgia: Secondary | ICD-10-CM

## 2012-10-28 ENCOUNTER — Other Ambulatory Visit: Payer: Self-pay | Admitting: *Deleted

## 2012-10-28 MED ORDER — CARVEDILOL 3.125 MG PO TABS: 3.1250 mg | ORAL_TABLET | Freq: Two times a day (BID) | ORAL | Status: DC

## 2012-11-14 ENCOUNTER — Other Ambulatory Visit: Payer: Self-pay | Admitting: *Deleted

## 2012-11-15 MED ORDER — AMLODIPINE BESYLATE 10 MG PO TABS: 10.0000 mg | ORAL_TABLET | Freq: Every day | ORAL | Status: DC

## 2012-12-02 ENCOUNTER — Other Ambulatory Visit: Payer: Medicaid Other

## 2012-12-10 ENCOUNTER — Other Ambulatory Visit: Payer: Self-pay | Admitting: Family Medicine

## 2013-01-15 ENCOUNTER — Emergency Department (HOSPITAL_COMMUNITY)
Admission: EM | Admit: 2013-01-15 | Discharge: 2013-01-15 | Disposition: A | Discharge status: Home / Self Care | Payer: Medicaid Other | Attending: Emergency Medicine | Admitting: Emergency Medicine

## 2013-01-15 ENCOUNTER — Encounter (HOSPITAL_COMMUNITY): Payer: Self-pay | Admitting: Emergency Medicine

## 2013-01-15 ENCOUNTER — Emergency Department (HOSPITAL_COMMUNITY): Payer: Medicaid Other

## 2013-01-15 DIAGNOSIS — K219 Gastro-esophageal reflux disease without esophagitis: Secondary | ICD-10-CM | POA: Insufficient documentation

## 2013-01-15 DIAGNOSIS — J449 Chronic obstructive pulmonary disease, unspecified: Secondary | ICD-10-CM | POA: Insufficient documentation

## 2013-01-15 DIAGNOSIS — G43909 Migraine, unspecified, not intractable, without status migrainosus: Secondary | ICD-10-CM | POA: Insufficient documentation

## 2013-01-15 DIAGNOSIS — Z8659 Personal history of other mental and behavioral disorders: Secondary | ICD-10-CM | POA: Insufficient documentation

## 2013-01-15 DIAGNOSIS — J4489 Other specified chronic obstructive pulmonary disease: Secondary | ICD-10-CM | POA: Insufficient documentation

## 2013-01-15 DIAGNOSIS — Z79899 Other long term (current) drug therapy: Secondary | ICD-10-CM | POA: Insufficient documentation

## 2013-01-15 DIAGNOSIS — Z8639 Personal history of other endocrine, nutritional and metabolic disease: Secondary | ICD-10-CM | POA: Insufficient documentation

## 2013-01-15 DIAGNOSIS — F329 Major depressive disorder, single episode, unspecified: Secondary | ICD-10-CM | POA: Insufficient documentation

## 2013-01-15 DIAGNOSIS — M542 Cervicalgia: Secondary | ICD-10-CM

## 2013-01-15 DIAGNOSIS — Z87891 Personal history of nicotine dependence: Secondary | ICD-10-CM | POA: Insufficient documentation

## 2013-01-15 DIAGNOSIS — F3289 Other specified depressive episodes: Secondary | ICD-10-CM | POA: Insufficient documentation

## 2013-01-15 DIAGNOSIS — Z9861 Coronary angioplasty status: Secondary | ICD-10-CM | POA: Insufficient documentation

## 2013-01-15 DIAGNOSIS — I10 Essential (primary) hypertension: Secondary | ICD-10-CM | POA: Insufficient documentation

## 2013-01-15 DIAGNOSIS — Z862 Personal history of diseases of the blood and blood-forming organs and certain disorders involving the immune mechanism: Secondary | ICD-10-CM | POA: Insufficient documentation

## 2013-01-15 DIAGNOSIS — Z8673 Personal history of transient ischemic attack (TIA), and cerebral infarction without residual deficits: Secondary | ICD-10-CM | POA: Insufficient documentation

## 2013-01-15 MED ORDER — HYDROMORPHONE HCL PF 2 MG/ML IJ SOLN
2.0000 mg | Freq: Once | INTRAMUSCULAR | Status: AC
  Administered 2013-01-15: 2 mg via INTRAMUSCULAR
  Filled 2013-01-15: qty 1

## 2013-01-15 NOTE — ED Notes (Signed)
C/O pain posterior neck and occipital area. No known injury. Onset 1 month ago.

## 2013-01-15 NOTE — ED Provider Notes (Signed)
History     CSN: [id number]  Arrival date & time 01/15/13  1052   First MD Initiated Contact with Patient 01/15/13 1137      Chief Complaint  Patient presents with  . Neck Pain    (Consider location/radiation/quality/duration/timing/severity/associated sxs/prior treatment) The history is provided by the patient and medical records.    62 year old female with a past history of COPD, previous CVA, psychiatric history, chronic pain.  She complains of worsening neck pain posteriorly over the past month.  Patient states that it starts in her shoulders and is now extending up in the posterior aspect of an the neck and the base of the occiput.  The patient denies any injury.  She states the pain is said that she is unable to move her own neck.  She can't lift her head.  The patient is already on hydrocodone 7.5/325 patient denies any neck stiffness, rash, headache, photophobia, phonophobia.  Patient denies previous history of pain.  She denies any rashes  Past Medical History  Diagnosis Date  . Hypertension   . Hyperlipidemia   . 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  . COPD (chronic obstructive pulmonary disease)   . Depression   . Personality disorder   . Chronic low back pain   . Sleep apnea     Mild. No need for CPAP.  Marland Kitchen History of CVA (cerebrovascular accident)   . GERD (gastroesophageal reflux disease)   . Migraine   . Stroke     Past Surgical History  Procedure Laterality Date  . Appendectomy    . Shoulder surgery    . Back surgery    . Cardiac catheterization  2008    Normal. Dr Elease Hashimoto.  . Knee surgery      Bilateral.    Family History  Problem Relation Age of Onset  . Cirrhosis Mother   . Diabetes Mother   . Cirrhosis Father   . Cancer Sister   . Cancer Sister     breast    History  Substance Use Topics  . Smoking status: Former Games developer  . Smokeless tobacco: Former Neurosurgeon    Quit date: 11/03/2010  . Alcohol Use: No     OB History   Grav Para Term Preterm Abortions TAB SAB Ect Mult Living                  Review of Systems Ten systems reviewed and are negative for acute change, except as noted in the HPI.    Allergies  Amoxicillin; Aspirin; Codeine phosphate; Penicillins; Prednisone; and Tetanus toxoids  Home Medications   Current Outpatient Rx  Name  Route  Sig  Dispense  Refill  . amLODipine (NORVASC) 10 MG tablet   Oral   Take 10 mg by mouth daily.         . carvedilol (COREG) 3.125 MG tablet   Oral   Take 3.125 mg by mouth 2 (two) times daily with a meal.         . citalopram (CELEXA) 20 MG tablet   Oral   Take 20 mg by mouth daily.         Marland Kitchen gabapentin (NEURONTIN) 300 MG capsule   Oral   Take 900 mg by mouth 3 (three) times daily.         Marland Kitchen HYDROcodone-acetaminophen (VICODIN) 5-500 MG per tablet   Oral   Take 1 tablet by mouth daily as needed (for left foot pain).         Marland Kitchen  Ipratropium-Albuterol (COMBIVENT RESPIMAT) 20-100 MCG/ACT AERS respimat   Inhalation   Inhale 1 puff into the lungs every 6 (six) hours.         . NONFORMULARY OR COMPOUNDED ITEM   Topical   Apply 1 application topically daily. Applies to right foot.         Marland Kitchen omeprazole (PRILOSEC) 20 MG capsule   Oral   Take 20 mg by mouth daily.         Marland Kitchen PHENObarbital (LUMINAL) 100 MG tablet   Oral   Take 100 mg by mouth at bedtime.           BP 122/73  Pulse 71  Temp(Src) 98.1 F (36.7 C)  SpO2 97%  Physical Exam  Constitutional: She is oriented to person, place, and time. She appears well-developed and well-nourished. No distress.  HENT:  Head: Normocephalic and atraumatic.  Eyes: Conjunctivae are normal. No scleral icterus.  Neck: Normal range of motion.  Cardiovascular: Normal rate, regular rhythm and normal heart sounds.  Exam reveals no gallop and no friction rub.   No murmur heard. Pulmonary/Chest: Effort normal and breath sounds normal. No respiratory distress.   Abdominal: Soft. Bowel sounds are normal. She exhibits no distension and no mass. There is no tenderness. There is no guarding.  Musculoskeletal:  Patient is tender to palpation in the posterior cervical paraspinal muscles, and occipital region.  She is tender to palpation in the trapezius muscle.  No midline tenderness.  Patient is able to flex her chin to her chest.  She has difficulty with lateral flexion and rotation of the neck.  Neurological: She is alert and oriented to person, place, and time.  Grip strength 5/5.  No numbness or tingling in the hands.  Skin: Skin is warm and dry. She is not diaphoretic.    ED Course  Procedures (including critical care time)  Labs Reviewed - No data to display Dg Cervical Spine 2-3 Views  01/15/2013  *RADIOLOGY REPORT*  Clinical Data: Neck pain without known injury.  CERVICAL SPINE - 2-3 VIEW  Comparison: Plain film cervical spine 10/20/2012.  Findings: Vertebral body height and alignment maintained. Intervertebral disc space height is normal.  There is mild exaggeration of the normal cervical lordosis.  Prevertebral soft tissues appear normal.  There is some carotid atherosclerosis.  IMPRESSION: No acute or focal abnormality.   Original Report Authenticated By: Holley Dexter, M.D.      1. Neck pain, bilateral posterior       MDM  1:16 PM BP 122/73  Pulse 71  Temp(Src) 98.1 F (36.7 C)  SpO2 97% Patient with negative x-ray of the spine.  Patient will be discharged to her PCP for further evaluation. . No signs or zoster. No concern for meningitis. It is already taking hydrocodone 7.5mg . I do not feel comfortable changing or adding a new narctic for pain control, I also do not want to add an NSAID due to patient's HTN history.  I have given the patient a soft cervical collard and advised her to call the prescribing physcian to change her dose if pain control is insufficient.  The patient needs to follow up with her PCP regarding her neck  pain and chronic pain control. Discussed reasons to seek immediate care. Patient expresses understanding and agrees with plan. The patient appears reasonably screened and/or stabilized for discharge and I doubt any other medical condition or other [facility] requiring further screening, evaluation, or treatment in the ED at this time prior to  discharge.       Arthor Captain, PA-C 01/15/13 1845

## 2013-01-15 NOTE — ED Notes (Signed)
Having pain in back of neck that goes up to head x 1 month has been getting worse hurts and throbs a lot no injury she staes

## 2013-01-17 NOTE — ED Provider Notes (Signed)
Medical screening examination/treatment/procedure(s) were performed by non-physician practitioner and as supervising physician I was immediately available for consultation/collaboration.  Derwood Kaplan, MD 01/17/13 [phone]

## 2013-01-19 ENCOUNTER — Encounter: Payer: Self-pay | Admitting: Family Medicine

## 2013-01-19 ENCOUNTER — Ambulatory Visit (INDEPENDENT_AMBULATORY_CARE_PROVIDER_SITE_OTHER): Payer: Medicaid Other | Admitting: Family Medicine

## 2013-01-19 VITALS — BP 130/84 | HR 80 | Ht 66.0 in | Wt 199.7 lb

## 2013-01-19 DIAGNOSIS — M542 Cervicalgia: Secondary | ICD-10-CM

## 2013-01-19 MED ORDER — CYCLOBENZAPRINE HCL 10 MG PO TABS: 5.0000 mg | ORAL_TABLET | Freq: Three times a day (TID) | ORAL | Status: DC | PRN

## 2013-01-19 NOTE — Assessment & Plan Note (Signed)
Does have muscular TTP, muscles feel tight, will try Flexeril to see if this helps.  Encouraged not using the soft neck brace and doing some ROM activities.  Heat as tolerated.  Could add antiinflammatory if flexeril only partially helping.  Could consider ortho referral for ?injections.  F/u with PCP.

## 2013-01-19 NOTE — Patient Instructions (Signed)
It was nice to meet you today.  I'm sorry your neck is hurting so much.  Try taking the flexeril, which is a muscle relaxer.  Come back to see Dr. Gwenlyn Saran in the next few weeks to see if it is helping.  If not, she may want to send you to the orthopedic doctors.

## 2013-01-19 NOTE — Progress Notes (Signed)
S: Pt comes in today for SDA for neck pain.  Patient was seen in the ER 4 days ago for this.  Has been gradually worsening for the past month.  Has chronic pain and is on Vicodin, Neurontin, Celexa.   Her Vicodin is Rx'ed by her foot doctor.  Pain initially started in the base of her neck but has spread up to her skull.  Pain is throbbing, 10/10, preventing her from sleeping.  Vicodin does not seem to be helping.  Has also tried ice, heat, and the neck collar given to her by the ER- none of these have helped.  She had a negative/normal neck Xray at that time as well.  Pain does not radiate to her arms or down her back.  No numbness/tingling. No weakness.    ROS: Per HPI  History  Smoking status  . Former Smoker  Smokeless tobacco  . Former Neurosurgeon  . Quit date: 11/03/2010    O:  Filed Vitals:   01/19/13 1340  BP: 130/84  Pulse: 80    Gen: NAD CV: RRR, no murmur Pulm: CTA bilat, no wheezes or crackles Neck: no rigidity, no meningeal signs, no obvious deformity; ROM limited by pain; + paraspinal TTP bilaterally from base of neck to occipital area  Neuro: 5/5 strength BUE, sensation intact throughout. CN grossly normal    A/P: 62 y.o. female p/w neck pain -See problem list -f/u in 1-2 weeks with PCP

## 2013-01-26 ENCOUNTER — Other Ambulatory Visit: Payer: Self-pay | Admitting: Family Medicine

## 2013-02-06 ENCOUNTER — Ambulatory Visit
Admission: RE | Admit: 2013-02-06 | Discharge: 2013-02-06 | Disposition: A | Discharge status: Home / Self Care | Payer: Medicaid Other | Source: Ambulatory Visit | Attending: Podiatrist | Admitting: Podiatrist

## 2013-02-06 DIAGNOSIS — M25572 Pain in left ankle and joints of left foot: Secondary | ICD-10-CM

## 2013-02-09 ENCOUNTER — Ambulatory Visit: Payer: Medicaid Other | Admitting: Family Medicine

## 2013-02-10 ENCOUNTER — Ambulatory Visit (INDEPENDENT_AMBULATORY_CARE_PROVIDER_SITE_OTHER): Payer: Medicaid Other | Admitting: Family Medicine

## 2013-02-10 ENCOUNTER — Encounter: Payer: Self-pay | Admitting: Family Medicine

## 2013-02-10 VITALS — BP 123/75 | HR 73 | Temp 97.2°F | Resp 18 | Ht 66.0 in | Wt 197.0 lb

## 2013-02-10 DIAGNOSIS — R296 Repeated falls: Secondary | ICD-10-CM | POA: Insufficient documentation

## 2013-02-10 DIAGNOSIS — M542 Cervicalgia: Secondary | ICD-10-CM

## 2013-02-10 DIAGNOSIS — Z9181 History of falling: Secondary | ICD-10-CM

## 2013-02-10 MED ORDER — METHOCARBAMOL 750 MG PO TABS: 750.0000 mg | ORAL_TABLET | Freq: Four times a day (QID) | ORAL | Status: DC | PRN

## 2013-02-10 NOTE — Assessment & Plan Note (Signed)
Try changing to robaxin to see if different muscle relaxer works well.  Trigger point injections performed today.  Could consider repeat of this (without steroids) if it helps some and still some trigger points remain.

## 2013-02-10 NOTE — Assessment & Plan Note (Signed)
Referral to OT to see if there are any other devices that might help.  Patient has significant back and knee problems.

## 2013-02-10 NOTE — Progress Notes (Signed)
Patient ID: [name], female   DOB: Feb 07, 1951, 62 y.o.   MRN: [id number] Subjective: The patient is a 62 y.o. year old female who presents today for neck pain and falls.  1. Neck pain: Patient is a several month history of neck pain. She was last seen for this x-rays were performed which were essentially normal. She was given Flexeril which she reports is not working. Neck pain continues to be significantly problematic it is keeping her from sleeping at night. Pain is made worse by movement of her head. Pain is located on he is out of the neck and extending onto the upper back. She has no known trauma.  2. Falls: Patient has multiple orthopedic problems with her lower legs reports that over the last several month she has been falling area she has both a cane and a walker at home and reports using them but continues to have problems with falls.  Caused either because of pain in her feet and knees work because her knees, which have known arthritis, give out.  Patient's past medical, social, and family history were reviewed and updated as appropriate. History  Substance Use Topics  . Smoking status: Former Games developer  . Smokeless tobacco: Former Neurosurgeon    Quit date: 11/03/2010  . Alcohol Use: No   Objective:  Filed Vitals:   02/10/13 1450  BP: 123/75  Pulse: 73  Temp: 97.2 F (36.2 C)  Resp: 18   Gen: NAD, obese Neck: Diffuse muscle spasm with significant tenderness in multiple tender points, even to light touch.  No bony deformities Gait is very slow and shuffling.  Procedure note: Consent obtained and time out performed.  Neck cleaned and 4 injections using 1% lidocaine with epi combined with 40mg  depo-medrol were instilled into trigger points on the superior border of the trapezious bilaterally.  Patient experienced immediate decrease in symptoms and increased neck mobility  Assessment/Plan:  Please also see individual problems in problem list for problem-specific plans.

## 2013-02-10 NOTE — Patient Instructions (Signed)
It was good to see you today! I have sent the medicine Robaxin in to the pharmacy.  You can take it up to 4 times per day. If the injections we did in your neck seem to have helped but there are more areas that are bothering you, we can do more injections in about a week.

## 2013-02-23 ENCOUNTER — Other Ambulatory Visit: Payer: Self-pay | Admitting: Family Medicine

## 2013-03-03 ENCOUNTER — Emergency Department: Payer: Self-pay | Admitting: Emergency Medicine

## 2013-03-06 ENCOUNTER — Ambulatory Visit: Payer: Medicaid Other | Admitting: Family Medicine

## 2013-03-09 ENCOUNTER — Ambulatory Visit (INDEPENDENT_AMBULATORY_CARE_PROVIDER_SITE_OTHER): Payer: Medicaid Other | Admitting: Family Medicine

## 2013-03-09 ENCOUNTER — Encounter: Payer: Self-pay | Admitting: Family Medicine

## 2013-03-09 VITALS — BP 124/72 | HR 67 | Temp 98.8°F | Ht 66.0 in | Wt 201.0 lb

## 2013-03-09 DIAGNOSIS — M542 Cervicalgia: Secondary | ICD-10-CM

## 2013-03-09 MED ORDER — HYDROCODONE-ACETAMINOPHEN 5-325 MG PO TABS: 1.0000 | ORAL_TABLET | Freq: Two times a day (BID) | ORAL | Status: DC | PRN

## 2013-03-09 MED ORDER — HYDROCODONE-ACETAMINOPHEN 5-325 MG PO TABS: ORAL_TABLET | ORAL | Status: DC

## 2013-03-09 NOTE — Patient Instructions (Signed)
I do not think another trigger point injection is going to be very useful for you today.  What I am going to do is give you a prescription for some pain medication and get you in to see physical therapy.  We really can't use the pain medication as a long-term solution but hopefully it will help until you can see the physical therapists.

## 2013-03-17 ENCOUNTER — Ambulatory Visit: Payer: Medicaid Other | Attending: Family Medicine | Admitting: Physical Therapy

## 2013-03-17 DIAGNOSIS — R269 Unspecified abnormalities of gait and mobility: Secondary | ICD-10-CM | POA: Insufficient documentation

## 2013-03-17 DIAGNOSIS — IMO0001 Reserved for inherently not codable concepts without codable children: Secondary | ICD-10-CM | POA: Insufficient documentation

## 2013-03-20 NOTE — Progress Notes (Signed)
Patient ID: [name], female   DOB: May 30, 1951, 62 y.o.   MRN: [id number] Subjective: The patient is a 62 y.o. year old female who presents today for continued neck pain.  Patient reports that following her trigger point injections 3 weeks ago, she experience to 2 weeks of relief. In the past week, her pain is in been increasing. She returns today for additional injections. Pain remains located in her neck, worsened by movement. It is not associated with any falls or trauma. She denies any fevers, chills, headaches, visual changes, or difficulty swallowing.  Patient's past medical, social, and family history were reviewed and updated as appropriate. History  Substance Use Topics  . Smoking status: Former Games developer  . Smokeless tobacco: Former Neurosurgeon    Quit date: 11/03/2010  . Alcohol Use: No   Objective:  Filed Vitals:   03/09/13 1406  BP: 124/72  Pulse: 67  Temp: 98.8 F (37.1 C)   Gen: No acute distress, obese HEENT: There is diffuse tenderness in the trapezius, deltoids, and supraspinatus bilaterally. There does not appear to be any obvious muscle spasm. There are, however, some areas that are significantly more tender than others.  Procedure note: Informed consent was obtained and a timeout was performed. The neck was prepped in sterile fashion. 10 cc of 1% lidocaine without epinephrine were injected into 4 trigger points on each side of the neck. Patient reported significant relief of symptoms with this.  Assessment/Plan:  Please also see individual problems in problem list for problem-specific plans.

## 2013-03-20 NOTE — Assessment & Plan Note (Signed)
Trigger point injections today performed per patient request. I am not overly optimistic that there will be a long-term help with these, however the patient is fairly certain that they will help. I have also made referral to physical therapy as I think they are more likely to provide long-term benefit the patient. I recommended the patient slightly increase the Norco that she is chronically taking (I believe as prescribed by her orthopedist) from one pill in the morning and 2 in the evening up to one pill in the morning, one at lunch, and 2 in the evening. I provided her with an additional prescription for Norco to cover these additional pills. As explained to the patient this is not a long-term solution.

## 2013-03-22 HISTORY — PX: FOOT TENDON SURGERY: SHX958

## 2013-03-26 ENCOUNTER — Other Ambulatory Visit: Payer: Self-pay | Admitting: Family Medicine

## 2013-04-23 ENCOUNTER — Other Ambulatory Visit: Payer: Self-pay | Admitting: Family Medicine

## 2013-05-02 ENCOUNTER — Other Ambulatory Visit: Payer: Self-pay | Admitting: Family Medicine

## 2013-05-25 ENCOUNTER — Other Ambulatory Visit: Payer: Self-pay

## 2013-05-25 MED ORDER — PHENOBARBITAL 100 MG PO TABS: 100.0000 mg | ORAL_TABLET | Freq: Every day | ORAL | Status: DC

## 2013-05-25 NOTE — Telephone Encounter (Signed)
Rx signed and faxed.

## 2013-05-27 ENCOUNTER — Encounter: Payer: Self-pay | Admitting: Nurse Practitioner

## 2013-05-27 ENCOUNTER — Ambulatory Visit (INDEPENDENT_AMBULATORY_CARE_PROVIDER_SITE_OTHER): Payer: Medicaid Other | Admitting: Nurse Practitioner

## 2013-05-27 VITALS — BP 98/63 | HR 70 | Ht 65.5 in | Wt 203.0 lb

## 2013-05-27 DIAGNOSIS — R519 Headache, unspecified: Secondary | ICD-10-CM

## 2013-05-27 DIAGNOSIS — G40309 Generalized idiopathic epilepsy and epileptic syndromes, not intractable, without status epilepticus: Secondary | ICD-10-CM | POA: Insufficient documentation

## 2013-05-27 DIAGNOSIS — R51 Headache: Secondary | ICD-10-CM

## 2013-05-27 DIAGNOSIS — Z79899 Other long term (current) drug therapy: Secondary | ICD-10-CM

## 2013-05-27 HISTORY — DX: Headache, unspecified: R51.9

## 2013-05-27 MED ORDER — TOPIRAMATE 25 MG PO TABS: 50.0000 mg | ORAL_TABLET | Freq: Two times a day (BID) | ORAL | Status: DC

## 2013-05-27 NOTE — Progress Notes (Signed)
I have read the note, and I agree with the clinical assessment and plan.  

## 2013-05-27 NOTE — Progress Notes (Signed)
HPI: Ms [name] is a 62 year old right-handed black female with a history of seizure events and a left hemisensory deficit. The patient indicates that she has gone 15 years without a seizure, and she suffered a seizure event in June 2013.  The patient has been on phenobarbital, and she indicates that she has not missed a dose of the medication. In 2007, the patient had a MRI the brain that was normal. The patient indicates that over the last year, she began having headaches that are bitemporal and at the vertex of the head, associated with a throbbing pain. The headaches have gotten better with the addition of Topamax. The patient may have nausea and vomiting with the headache. The patient indicates that she does not drive a car. The patient continues to have left-sided numbness, and she feels somewhat weak on the left side. The patient has her left leg in a cast. She is ambulating with a cane.  The patient has significant degenerative arthritis of the knees, and she has low back pain.   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 EEG was normal.  ROS:  Blurred vision cough, wheezing, increased headache, allergies  Physical Exam General: well developed,  moderately obese female  seated, in no evident distress Head: head normocephalic and atraumatic. Oropharynx benign Neck: supple with no carotid  bruits Cardiovascular: regular rate and rhythm, no murmurs  Neurologic Exam Mental Status: Awake and fully alert. Oriented to place and time. Follows all commands. Speech and language normal.   Cranial Nerves: Pupils equal, briskly reactive to light. Extraocular movements full without nystagmus. Visual fields full to confrontation. Hearing intact and symmetric to finger snap. Facial sensation intact. Face, tongue, palate move normally and symmetrically. Neck flexion and extension normal.  Motor: Normal bulk and tone. Normal strength in all tested extremity muscles except left  lower leg that has brace. She is partial weightbearing on the left leg Sensory.: intact to touch and pinprick and vibratory in all 3 extremities, left leg unable to check due to  cast.  Coordination: Rapid alternating movements normal in all extremities. Finger-to-nose and heel-to-shin performed accurately  on the right.  Gait and Station: Arises from chair without difficulty.  Limping gait on the left leg. Tandem gait not attempted, Romberg negative using single-point cane  Reflexes:  depressed bilaterally  Toes downgoing.      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

## 2013-05-27 NOTE — Patient Instructions (Addendum)
Increase Topamax to 2 tablets twice daily Continue phenobarbital 100 daily Call for any seizure activity  Will check labs today Followup in 6 months

## 2013-05-28 LAB — CBC WITH DIFFERENTIAL/PLATELET
Eos: 3 % (ref 0–5)
HCT: 33.9 % — ABNORMAL LOW (ref 34.0–46.6)
Immature Granulocytes: 0 % (ref 0–2)
Lymphocytes Absolute: 3.2 10*3/uL — ABNORMAL HIGH (ref 0.7–3.1)
MCHC: 31.6 g/dL (ref 31.5–35.7)
MCV: 82 fL (ref 79–97)
Monocytes Absolute: 0.7 10*3/uL (ref 0.1–0.9)
RDW: 15 % (ref 12.3–15.4)
WBC: 7.3 10*3/uL (ref 3.4–10.8)

## 2013-05-28 LAB — COMPREHENSIVE METABOLIC PANEL
AST: 13 IU/L (ref 0–40)
Albumin/Globulin Ratio: 1.6 (ref 1.1–2.5)
Alkaline Phosphatase: 92 IU/L (ref 39–117)
BUN/Creatinine Ratio: 23 (ref 11–26)
CO2: 26 mmol/L (ref 18–29)
Creatinine, Ser: 0.74 mg/dL (ref 0.57–1.00)
GFR calc non Af Amer: 86 mL/min/{1.73_m2} (ref >59)
Globulin, Total: 2.7 g/dL (ref 1.5–4.5)
Sodium: 142 mmol/L (ref 134–144)
Total Bilirubin: 0.1 mg/dL (ref 0.0–1.2)

## 2013-06-02 ENCOUNTER — Telehealth: Payer: Self-pay

## 2013-06-02 NOTE — Telephone Encounter (Signed)
Message copied by [facility] on Tue Jun 02, 2013  8:50 AM ------      Message from: Beverely Low      Created: Thu May 28, 2013  8:52 AM       Labs look good except hemoglobin a little low. You an take additional iron.             ----- Message -----         From: [facility] Lab Results In Interface         Sent: 05/28/2013   5:47 AM           To: Nilda Riggs, NP                   ------

## 2013-06-02 NOTE — Telephone Encounter (Signed)
I called patient and gave her the results of her lab work. I let her know that Ms. Paula Deutscher, NP would like her to take extra iron daily and this should help with her low hemoglobin.

## 2013-06-03 ENCOUNTER — Other Ambulatory Visit: Payer: Self-pay | Admitting: Family Medicine

## 2013-06-03 MED ORDER — AMLODIPINE BESYLATE 10 MG PO TABS: 10.0000 mg | ORAL_TABLET | Freq: Every day | ORAL | Status: DC

## 2013-07-13 ENCOUNTER — Emergency Department: Payer: Self-pay | Admitting: Emergency Medicine

## 2013-07-13 LAB — COMPREHENSIVE METABOLIC PANEL
Alkaline Phosphatase: 100 U/L (ref 50–136)
Anion Gap: 6 — ABNORMAL LOW (ref 7–16)
BUN: 13 mg/dL (ref 7–18)
Bilirubin,Total: 0.3 mg/dL (ref 0.2–1.0)
Co2: 23 mmol/L (ref 21–32)
EGFR (African American): >60
Osmolality: 275 (ref 275–301)
Potassium: 4.4 mmol/L (ref 3.5–5.1)

## 2013-07-13 LAB — CBC
HCT: 35.8 % (ref 35.0–47.0)
HGB: 11.8 g/dL — ABNORMAL LOW (ref 12.0–16.0)
MCH: 26.7 pg (ref 26.0–34.0)
MCV: 81 fL (ref 80–100)
RBC: 4.42 10*6/uL (ref 3.80–5.20)
WBC: 6 10*3/uL (ref 3.6–11.0)

## 2013-07-13 LAB — TROPONIN I: Troponin-I: <0.02 ng/mL

## 2013-07-14 ENCOUNTER — Other Ambulatory Visit: Payer: Self-pay | Admitting: Family Medicine

## 2013-07-14 MED ORDER — CARVEDILOL 3.125 MG PO TABS: ORAL_TABLET | ORAL | Status: DC

## 2013-07-14 NOTE — Progress Notes (Signed)
Fax refill from [facility] for [employer]. Filled electronically #[id number].  Amanuel Sinkfield M. Jaquelynn Wanamaker, M.D.

## 2013-07-23 ENCOUNTER — Other Ambulatory Visit: Payer: Self-pay | Admitting: *Deleted

## 2013-07-23 MED ORDER — DIAZEPAM 5 MG PO TABS: 5.0000 mg | ORAL_TABLET | Freq: Every evening | ORAL | Status: DC | PRN

## 2013-07-31 ENCOUNTER — Encounter: Payer: Self-pay | Admitting: Podiatrist

## 2013-07-31 ENCOUNTER — Ambulatory Visit (INDEPENDENT_AMBULATORY_CARE_PROVIDER_SITE_OTHER): Payer: Medicaid Other | Admitting: Podiatrist

## 2013-07-31 ENCOUNTER — Ambulatory Visit (INDEPENDENT_AMBULATORY_CARE_PROVIDER_SITE_OTHER): Payer: Medicaid Other | Admitting: *Deleted

## 2013-07-31 VITALS — BP 112/68 | HR 70 | Resp 16 | Ht 66.0 in | Wt 177.0 lb

## 2013-07-31 DIAGNOSIS — Z23 Encounter for immunization: Secondary | ICD-10-CM

## 2013-07-31 DIAGNOSIS — M7660 Achilles tendinitis, unspecified leg: Secondary | ICD-10-CM

## 2013-07-31 DIAGNOSIS — M7750 Other enthesopathy of unspecified foot: Secondary | ICD-10-CM

## 2013-07-31 DIAGNOSIS — Z9889 Other specified postprocedural states: Secondary | ICD-10-CM

## 2013-07-31 MED ORDER — HYDROCODONE-ACETAMINOPHEN 5-325 MG PO TABS: ORAL_TABLET | ORAL | Status: DC

## 2013-07-31 NOTE — Patient Instructions (Signed)
Outpatient Surgery Guidelines, Adult These are general instructions for patients who will be going home the same day as the procedure (outpatient). LET YOUR CAREGIVER KNOW ABOUT:  Allergies to food or medicine.  Medicines taken, including vitamins, herbs, eyedrops, over-the-counter medicines, and creams.  Use of steroids (by mouth or creams).  Previous problems with anesthetics or numbing medicines.  History of bleeding problems or blood clots.  Previous surgery.  Other health problems, including diabetes and kidney problems.  Possibility of pregnancy, if this applies. RISKS AND COMPLICATIONS Your caregiver will discuss possible risks and complications with you before surgery. Common risks and complications include:   Problems due to anesthesia.  Blood loss and replacement (does not apply to minor surgical procedures).  Temporary increase in pain due to surgery.  Uncorrected pain or problems the surgery was meant to correct.  Infection.  New damage. BEFORE THE PROCEDURE  Stop taking herbal supplements 2 weeks prior to surgery.  Stop smoking at least 2 weeks prior to surgery. This lowers your risk for complications during and after surgery. Ask your caregiver for help with this if needed.  Do not take aspirin for 1 week prior to surgery unless instructed otherwise by your caregiver.  Do not take anti-inflammatory medicines (such as ibuprofen) for 48 hours prior to surgery.  The day before surgery, eat your usual meals and a light supper. Continue fluid intake. Do not drink alcohol.  Do not eat or drink after midnight before your surgery. Take your usual medicine the morning of surgery with a sip of water unless instructed otherwise. Check with your caregiver if you are unsure.  Arrange for someone to take you home from the hospital and to stay with you for 24 hours after the procedure. Medicine given for your procedure may prevent you from driving a car or caring for  yourself.  Call your caregiver's office the morning prior to surgery if you develop an illness or problem which may prevent you from safely having your procedure.  You should be present 60 minutes prior to your procedure or as directed. AFTER THE PROCEDURE After surgery, you will be taken to the recovery area where a nurse will monitor your progress. When you are awake, stable, taking fluids well, and there are no complications, you will be allowed to go home. You may have numbness around the surgical site. Healing will take some time. You will have tenderness at the surgical site and there may be some swelling and bruising. You may have some nausea. HOME CARE INSTRUCTIONS  Do not drive for 24 hours or as instructed by your caregiver. Do not drive while taking prescription pain medicines.  Do not drink alcohol for 24 hours.  Do not make important decisions or sign legal documents for 24 hours.  You may resume a normal diet and activities as directed.  Do not lift anything heavier than 10 pounds (4.5 kg) or play contact sports until your caregiver says it is okay.  Change your bandages (dressings) as directed.  Only take over-the-counter or prescription medicines for pain, discomfort, or fever as directed by your caregiver.  Keep all appointments as scheduled and follow all instructions.  Ask questions if you do not understand something. SEEK MEDICAL CARE IF:  You have increased bleeding (more than a small spot) from the surgical site.  You have redness, swelling, or increasing pain in the wound.  You see pus coming from the wound.  You have a fever.  You notice a bad smell  coming from the wound or dressing.  You feel lightheaded or faint. SEEK IMMEDIATE MEDICAL CARE IF:  You develop a rash.  You have trouble breathing.  You develop allergies. Document Released: 07/03/2001 Document Revised: 12/31/2011 Document Reviewed: 05/21/2011 [facility] Patient Information 2014  [place], [place].

## 2013-07-31 NOTE — Progress Notes (Signed)
MRN: [id number] Name: [name]  Sex: female Age: 62 y.o. DOB: 10-03-1951  Provider: Marlowe Aschoff P  Allergies: Amoxicillin; Aspirin; Codeine phosphate; Flexeril; Penicillins; Prednisone; and Tetanus toxoids   Chief Complaint  Patient presents with  . Routine Post Op    pov dos 03/23/13 left foot,  "It's still swollen, it just ain't getting right."     HPI: Patient is 62 y.o. female who presents today for postoperative followup left foot status post posterior tibial tendon repair and repair of peroneal tendons. Patient reports the lateral portion of the foot is not getting any better and she continues to wear her air fracture walker and she's unable to wear a normal shoe. She has been compliant with range of motion exercises at home and states that she's noticed no improvement since after surgery 03/23/2013 Past Medical History  Diagnosis Date  . Hypertension   . Hyperlipidemia   . 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  . COPD (chronic obstructive pulmonary disease)   . Depression   . Personality disorder   . Chronic low back pain   . Sleep apnea     Mild. No need for CPAP.  Marland Kitchen History of CVA (cerebrovascular accident)   . GERD (gastroesophageal reflux disease)   . Migraine   . Stroke        Medication List       This list is accurate as of: 07/31/13  5:07 PM.  Always use your most recent med list.               amLODipine 10 MG tablet  Commonly known as:  NORVASC  Take 1 tablet (10 mg total) by mouth daily.     carvedilol 3.125 MG tablet  Commonly known as:  COREG  TAKE 1 TABLET BY MOUTH TWICE A DAY WITH A MEAL     citalopram 20 MG tablet  Commonly known as:  CELEXA  TAKE 1 TABLET BY MOUTH ONCE A DAY     COMBIVENT RESPIMAT 20-100 MCG/ACT Aers respimat  Generic drug:  Ipratropium-Albuterol  Inhale 1 puff into the lungs every 6 (six) hours.     diazepam 5 MG tablet  Commonly known as:  VALIUM  Take 1 tablet (5 mg  total) by mouth at bedtime as needed (MUSCLE SPASMS).     diazepam 5 MG/ML solution  Commonly known as:  VALIUM  Take 5 mg by mouth at bedtime. Foot spasm     gabapentin 300 MG capsule  Commonly known as:  NEURONTIN  TAKE 3 CAPSULES BY MOUTH TWICE A DAY     gabapentin 300 MG capsule  Commonly known as:  NEURONTIN  Take 900 mg by mouth 3 (three) times daily.     HYDROcodone-acetaminophen 5-325 MG per tablet  Commonly known as:  NORCO  1 PO in the morning, 1 PO at lunch, 2 PO at bedtime     NONFORMULARY OR COMPOUNDED ITEM  - Apply 1 application topically daily. Applies to right foot.  - Compound contains: Ketoprofen 5%;Gabapentin 2%;Amitriptylline 2%;Lidocaine 2%.     omeprazole 20 MG capsule  Commonly known as:  PRILOSEC  TAKE 1 CAPSULE BY MOUTH ONCE DAILY     PHENObarbital 100 MG tablet  Commonly known as:  LUMINAL  Take 1 tablet (100 mg total) by mouth daily.     topiramate 25 MG tablet  Commonly known as:  TOPAMAX  Take 2 tablets (50 mg total) by mouth 2 (two) times  daily.        Meds ordered this encounter  Medications  . HYDROcodone-acetaminophen (NORCO) 5-325 MG per tablet    Sig: 1 PO in the morning, 1 PO at lunch, 2 PO at bedtime    Dispense:  90 tablet    Refill:  0    Past Surgical History  Procedure Laterality Date  . Appendectomy    . Shoulder surgery    . Back surgery    . Cardiac catheterization  2008    Normal. Dr Elease Hashimoto.  . Knee surgery      Bilateral.  . Ankle surgery Left      History  Substance Use Topics  . Smoking status: Former Games developer  . Smokeless tobacco: Former Neurosurgeon    Quit date: 11/03/2010  . Alcohol Use: No    Family History  Problem Relation Age of Onset  . Cirrhosis Mother   . Diabetes Mother   . Cirrhosis Father   . Cancer Sister   . Cancer Sister     breast      Filed Vitals:   07/31/13 1504  BP: 112/68  Pulse: 70  Resp: 16    Physical Exam  GENERAL APPEARANCE: Alert, conversant. Appropriately groomed.  No acute distress.  VASCULAR: Pedal pulses palpable and strong bilateral.  Capillary refill time is immediate to all digits,  Proximal to distal cooling it warm to warm.  Digital hair growth is present bilateral NEUROLOGIC: sensation is intact epicritically and protectively to 5.07 monofilament at 5/5 sites bilateral.  Light touch is intact bilateral, vibratory sensation intact bilateral, achilles tendon reflex is intact bilateral. MUSCULOSKELETAL: Significant pain tenderness and guarding noted lateral left ankle. She also has pain dorsal aspect of the left foot at the medial and intermediate cuneiform area.  The patient has swelling and symptomatology posterior to the incision site and nerve type discomfort along the incision site itself. DERMATOLOGIC: Incisions are healed pain along incision site lateral foot is noted  Assessment   Tendinitis with possible nerve entrapment lateral incision site left foot,  contusion from boot versus bone spur dorsal aspect left foot  Plan  discussed the treatment options and alternatives with the patient. Discussed that we have tried extensive conservative treatment and that at this point postoperatively her foot should be feeling much better than it is. Patient can hardly bear weight without the boot and has significant pain when not wearing the boot. We discussed surgery to explore the tendon again and to free the nerve as it may be entrapped in scar tissue. Also discussed exploring the dorsal bump on left foot to see if it needs to be shaved down.    patient is eager to have the surgery and wishes to proceed. We did go over the consent form the patient's questions were answered to the best of my ability and she was consented for surgery a [facility] specialty surgery center. She will be seen one week postoperatively if she has any problems or concerns prior to surgery she is instructed to call. Also refilled her Percocet prescription.  Marlowe Aschoff  DPM   EGERTON, KATHRYN P, DPM

## 2013-08-12 ENCOUNTER — Encounter: Payer: Self-pay | Admitting: Podiatrist

## 2013-08-12 DIAGNOSIS — M21619 Bunion of unspecified foot: Secondary | ICD-10-CM

## 2013-08-14 ENCOUNTER — Other Ambulatory Visit: Payer: Self-pay | Admitting: Family Medicine

## 2013-08-17 ENCOUNTER — Ambulatory Visit: Payer: Medicaid Other | Admitting: Family Medicine

## 2013-08-17 ENCOUNTER — Other Ambulatory Visit: Payer: Self-pay | Admitting: Family Medicine

## 2013-08-19 ENCOUNTER — Ambulatory Visit (INDEPENDENT_AMBULATORY_CARE_PROVIDER_SITE_OTHER): Payer: Medicaid Other | Admitting: Podiatrist

## 2013-08-19 ENCOUNTER — Encounter: Payer: Self-pay | Admitting: Podiatrist

## 2013-08-19 VITALS — BP 150/64 | HR 86 | Resp 12 | Ht 66.0 in | Wt 167.0 lb

## 2013-08-19 DIAGNOSIS — M7750 Other enthesopathy of unspecified foot: Secondary | ICD-10-CM

## 2013-08-19 DIAGNOSIS — Z9889 Other specified postprocedural states: Secondary | ICD-10-CM

## 2013-08-19 DIAGNOSIS — M659 Synovitis and tenosynovitis, unspecified: Secondary | ICD-10-CM

## 2013-08-19 MED ORDER — HYDROCODONE-ACETAMINOPHEN 5-325 MG PO TABS: 2.0000 | ORAL_TABLET | ORAL | Status: DC | PRN

## 2013-08-19 MED ORDER — CEPHALEXIN 500 MG PO CAPS: 500.0000 mg | ORAL_CAPSULE | Freq: Three times a day (TID) | ORAL | Status: DC

## 2013-08-19 NOTE — Progress Notes (Signed)
Subjective: [name] presents today for her one week postoperative followup regarding peroneal longus tendon repair left foot. Date of surgery 08/10/2013.  Patient states "it is sore but okay."  Patient states the pain medication is too strong and she wants to go back to hydrocodone. She also states she's been having some fevers however today the fever broke but she still feels fatigued. She denies any calf pain or tenderness. Denies any nausea vomiting.  Objective: Her vascular status intact to the left foot. Excellent appearance of incision site with sutures in place. No redness no swelling noted around the incision site area. No drainage no malodor no signs of infection present Tenderness with palpation continues to be present. Overall excellent appearance noted   Assessment: Status post repair Left peroneus longus tendon- 1 week post op.   Plan: Redressed the foot and a dry sterile compressive dressing. Prescription for hydrocodone acetaminophen and Keflex 500 was dispensed. new boot also dispensed as old who was starting to fall apart. She will be seen back in one week for suture removal and recheck. If she notices any further nausea vomiting fevers chills night sweats or signs of infection she is instructed to call immediately.

## 2013-08-27 ENCOUNTER — Ambulatory Visit (INDEPENDENT_AMBULATORY_CARE_PROVIDER_SITE_OTHER): Payer: Medicaid Other | Admitting: Podiatrist

## 2013-08-27 ENCOUNTER — Encounter: Payer: Self-pay | Admitting: Podiatrist

## 2013-08-27 VITALS — BP 118/77 | HR 59 | Resp 16 | Ht 66.0 in | Wt 168.0 lb

## 2013-08-27 DIAGNOSIS — Z9889 Other specified postprocedural states: Secondary | ICD-10-CM

## 2013-08-27 NOTE — Patient Instructions (Signed)
Ankle Exercises for Rehabilitation he following are some suggestions for exercises and treatment, which can be done to help you regain full use of your ankle as soon as possible.  Follow all instructions regarding physical therapy.  Before exercising, it may be helpful to use heat on the muscles or joint being exercised. This loosens up the muscles and tendons (cord like structure) and decreases chances of injury during your exercises. If this is not possible just begin your exercises slowly to gradually warm up.  Do range of motion exercises. This means moving the ankle in all directions. Practice writing the alphabet with your toes in the air. Do not increase beyond a range that is comfortable.  Increase the strength of the muscles in the front of your leg by raising your toes and foot straight up in the air. Repeat this exercise as you did the calf exercise with the same warnings. This also help to stretch your muscles.   You may get your foot wet- do not submerge your foot in water.

## 2013-08-28 NOTE — Progress Notes (Signed)
Subjective: [name] presents today for her 2 week postoperative followup regarding peroneal tendon repair of the left foot date of surgery 08/10/2013. Patient states that her foot still hurts she continues to wear her boot as instructed. She denies any nausea, vomiting, a fevers or chills denies any calf pain or tenderness right.  Objective: Excellent appearance of the foot is seen. Incision site is well coapted with suture line in place. No redness, no swelling, no streaking, lymphangitis, no drainage, no signs of infection present. Tenderness and swelling along the lateral aspect of the ankle noted.  Assessment: Status post peroneal tendon repair left ankle date of surgery 08/10/2013  Plan: Sutures are removed at today's visit. A new compressive dressing was applied and the patient was instructed she can get the foot wet. Instructed her to continue wearing the Ace bandage and continue using of her boot. She will be seen back in 2 weeks for her one-month followup check.

## 2013-09-21 ENCOUNTER — Telehealth: Payer: Self-pay | Admitting: *Deleted

## 2013-09-21 NOTE — Telephone Encounter (Signed)
Fax refill request for hydrocodone 5/325mg  #90 two tablets po q 4 hrs.  I will inform Dr Irving Shows and call pt with advice.

## 2013-09-25 ENCOUNTER — Ambulatory Visit (INDEPENDENT_AMBULATORY_CARE_PROVIDER_SITE_OTHER): Payer: Medicaid Other

## 2013-09-25 ENCOUNTER — Ambulatory Visit (INDEPENDENT_AMBULATORY_CARE_PROVIDER_SITE_OTHER): Payer: Medicaid Other | Admitting: Podiatrist

## 2013-09-25 ENCOUNTER — Encounter: Payer: Self-pay | Admitting: Podiatrist

## 2013-09-25 VITALS — BP 113/73 | HR 67 | Resp 16 | Ht 66.0 in

## 2013-09-25 DIAGNOSIS — M659 Synovitis and tenosynovitis, unspecified: Secondary | ICD-10-CM

## 2013-09-25 DIAGNOSIS — S9030XA Contusion of unspecified foot, initial encounter: Secondary | ICD-10-CM

## 2013-09-25 DIAGNOSIS — R52 Pain, unspecified: Secondary | ICD-10-CM

## 2013-09-25 DIAGNOSIS — Z9889 Other specified postprocedural states: Secondary | ICD-10-CM

## 2013-09-25 DIAGNOSIS — S9032XA Contusion of left foot, initial encounter: Secondary | ICD-10-CM

## 2013-09-25 DIAGNOSIS — M7750 Other enthesopathy of unspecified foot: Secondary | ICD-10-CM

## 2013-09-25 MED ORDER — HYDROCODONE-ACETAMINOPHEN 5-325 MG PO TABS: 2.0000 | ORAL_TABLET | ORAL | Status: DC | PRN

## 2013-09-25 NOTE — Progress Notes (Signed)
   Subjective: [name] presents today for her 6 week postoperative followup regarding peroneal tendon repair of the left foot date of surgery 08/10/2013. Patient states that her foot was healing and feeling better however, she hit her foot against a bedpost directly over the incision site and relates there is now discomfort along the surgical incision.  She continues to wear her boot. She denies any nausea, vomiting, no  fevers or chills denies calf pain or tenderness .   Objective: Clinically, there is a good appearance of the foot is seen. No bruising at the incision site. Tenderness and swelling along the lateral aspect of the ankle noted.  Multiple brownish lesions noted on the plantar aspect of bilateral feet-  Appear to be tinea pedis and fungal in nature  Xrays taken and reviewed today.  No change on xray seen-  No sign of fracture noted.  Assessment: Status post peroneal tendon repair left ankle date of surgery 08/10/2013 -- recent contusion;  Tinea pedis  Plan: Recommended continued use of compression and surgigrip was dispensed for her use. RX for an ankle brace at [facility] dispensed and patient instructed to wean out of boot into good supportive shoe and ankle brace.  Recommended a PDC sock for the swelling.  Samples of Luzu cream dispensed for tinea pedis. I will see her back in 3-4 weeks for recheck of the foot.    Marlowe Aschoff, DPM

## 2013-09-25 NOTE — Patient Instructions (Signed)
You will get your ankle brace at [facility].  Take your hand written prescription with you.  I will see you back in 1 month.  Continue range of motion exercises and strengthing exercises.

## 2013-09-29 ENCOUNTER — Encounter: Payer: Self-pay | Admitting: *Deleted

## 2013-10-21 ENCOUNTER — Other Ambulatory Visit: Payer: Self-pay | Admitting: Family Medicine

## 2013-10-26 NOTE — Progress Notes (Signed)
1) Tendon repair and nerve release left ankle 2) Shave bone top of left foot at [facility]

## 2013-10-27 ENCOUNTER — Telehealth: Payer: Self-pay | Admitting: *Deleted

## 2013-10-27 NOTE — Telephone Encounter (Signed)
Faxed refill request for Diazepam 5 mg #30.

## 2013-10-29 ENCOUNTER — Other Ambulatory Visit: Payer: Self-pay | Admitting: *Deleted

## 2013-10-29 MED ORDER — DIAZEPAM 5 MG PO TABS: 5.0000 mg | ORAL_TABLET | Freq: Every evening | ORAL | Status: DC | PRN

## 2013-10-30 ENCOUNTER — Ambulatory Visit (INDEPENDENT_AMBULATORY_CARE_PROVIDER_SITE_OTHER): Payer: Medicaid Other | Admitting: Podiatrist

## 2013-10-30 ENCOUNTER — Encounter: Payer: Self-pay | Admitting: Podiatrist

## 2013-10-30 VITALS — BP 120/76 | HR 66 | Resp 18

## 2013-10-30 DIAGNOSIS — Z9889 Other specified postprocedural states: Secondary | ICD-10-CM

## 2013-10-30 DIAGNOSIS — M7750 Other enthesopathy of unspecified foot: Secondary | ICD-10-CM

## 2013-10-30 DIAGNOSIS — M65979 Unspecified synovitis and tenosynovitis, unspecified ankle and foot: Secondary | ICD-10-CM

## 2013-10-30 DIAGNOSIS — M659 Synovitis and tenosynovitis, unspecified: Secondary | ICD-10-CM

## 2013-10-30 MED ORDER — DIAZEPAM 5 MG PO TABS: 5.0000 mg | ORAL_TABLET | Freq: Every evening | ORAL | Status: DC | PRN

## 2013-10-30 MED ORDER — HYDROCODONE-ACETAMINOPHEN 5-325 MG PO TABS: 2.0000 | ORAL_TABLET | ORAL | Status: DC | PRN

## 2013-10-30 NOTE — Progress Notes (Signed)
Subjective: Starnisha presents today stating "My left foot is no better and something else is going and hurts on top and around my ankle and the brace is not helping that I got from [facility]"  I previously did a surgery on the medial aspect of the left ankle to repair the posterior tibial tendon which she states is not as bothersome today. Her peroneal tendons were also repaired and are still uncomfortable on the lateral aspect of her ankle and she complains about a tight vice grip type of feeling in the ankle joint itself. She states the surgery did not help and she wants to know what else can be done for this foot.  Objective: Incision sites are completely healed. She's very touchy and uncomfortable to the entire foot and ankle. She relates pain and tenderness with each step she takes and continues to wear her air fracture walker from surgery. She was dispensed a brace from [facility] however she states this is uncomfortable. Neurovascular status is intact and unchanged from the previous visit.  Assessment: Tendinitis, capsulitis, bursitis, status post left ankle surgery  Plan: I discussed with Seira that surgery was intended to fix the problem she had on her left ankle as it did on the right. However it does not appear that surgery helped on this left side. I would like for her to get a second opinion from Dr. Blenda Mounts to see if he has any further options or recommendations for [name]. She may be a candidate for a fusion if her pain continues. At today's visit she requested a below-knee cast and this was applied per her request. She'll be set up to see Dr. Blenda Mounts for evaluation. I also did refill her pain medication at today's visit as well.

## 2013-11-02 ENCOUNTER — Telehealth: Payer: Self-pay | Admitting: *Deleted

## 2013-11-02 NOTE — Progress Notes (Signed)
1) Tendon repair left foot

## 2013-11-02 NOTE — Telephone Encounter (Signed)
Pt states the rx for Valium was not called into the [facility] [phone].  I checked the medications list and Valium was ordered by Dr Valentina Lucks but not sent to the pharmacy.

## 2013-11-18 ENCOUNTER — Other Ambulatory Visit: Payer: Self-pay | Admitting: Family Medicine

## 2013-11-18 ENCOUNTER — Other Ambulatory Visit: Payer: Self-pay | Admitting: Emergency Medicine

## 2013-11-18 MED ORDER — CARVEDILOL 3.125 MG PO TABS: ORAL_TABLET | ORAL | Status: DC

## 2013-11-18 MED ORDER — OMEPRAZOLE 20 MG PO CPDR: 20.0000 mg | DELAYED_RELEASE_CAPSULE | Freq: Every day | ORAL | Status: DC

## 2013-11-19 ENCOUNTER — Other Ambulatory Visit: Payer: Self-pay

## 2013-11-19 ENCOUNTER — Other Ambulatory Visit: Payer: Self-pay | Admitting: Family Medicine

## 2013-11-19 MED ORDER — PHENOBARBITAL 100 MG PO TABS: 100.0000 mg | ORAL_TABLET | Freq: Every day | ORAL | Status: DC

## 2013-11-19 NOTE — Telephone Encounter (Signed)
Rx signed and faxed.

## 2013-11-26 NOTE — Progress Notes (Signed)
Dr Jorene Minors ordered PDC/Supplies and garment for left leg pain, due to chronic pain 250.60.  [place] form and pt's data and insurance sheets sent to [facility] [phone].

## 2013-11-27 ENCOUNTER — Ambulatory Visit: Payer: Medicaid Other | Admitting: Nurse Practitioner

## 2013-11-27 ENCOUNTER — Ambulatory Visit (INDEPENDENT_AMBULATORY_CARE_PROVIDER_SITE_OTHER): Payer: Medicaid Other

## 2013-11-27 ENCOUNTER — Other Ambulatory Visit: Payer: Self-pay | Admitting: *Deleted

## 2013-11-27 VITALS — BP 158/84 | HR 62 | Resp 12

## 2013-11-27 DIAGNOSIS — M79609 Pain in unspecified limb: Secondary | ICD-10-CM

## 2013-11-27 DIAGNOSIS — M659 Synovitis and tenosynovitis, unspecified: Secondary | ICD-10-CM

## 2013-11-27 DIAGNOSIS — M7750 Other enthesopathy of unspecified foot: Secondary | ICD-10-CM

## 2013-11-27 DIAGNOSIS — Z9889 Other specified postprocedural states: Secondary | ICD-10-CM

## 2013-11-27 MED ORDER — HYDROCODONE-ACETAMINOPHEN 5-325 MG PO TABS: 2.0000 | ORAL_TABLET | ORAL | Status: DC | PRN

## 2013-11-27 NOTE — Patient Instructions (Signed)
ICE INSTRUCTIONS  Apply ice or cold pack to the affected area at least 3 times a day for 10-15 minutes each time.  You should also use ice after prolonged activity or vigorous exercise.  Do not apply ice longer than 20 minutes at one time.  Always keep a cloth between your skin and the ice pack to prevent burns.  Being consistent and following these instructions will help control your symptoms.  We suggest you purchase a gel ice pack because they are reusable and do bit leak.  Some of them are designed to wrap around the area.  Use the method that works best for you.  Here are some other suggestions for icing.   Use a frozen bag of peas or corn-inexpensive and molds well to your body, usually stays frozen for 10 to 20 minutes.  Wet a towel with cold water and squeeze out the excess until it's damp.  Place in a bag in the freezer for 20 minutes. Then remove and use.  Resume using the air fracture boot or walker that you have.

## 2013-11-27 NOTE — Progress Notes (Signed)
   Subjective:    Patient ID: [name], female    DOB: [birth year], 63 y.o.   MRN: [id number]  HPI '' THE ANKLE AND TOES ARE HURTING SO BAD IN THIS CAST.''    Review of Systems  Respiratory: Negative.   Cardiovascular: Negative.   Gastrointestinal: Negative.   Endocrine: Negative.   Genitourinary: Negative.   Musculoskeletal: Positive for arthralgias, gait problem and myalgias.  Skin: Positive for color change.  Allergic/Immunologic: Negative.   Neurological: Positive for seizures and weakness.  Psychiatric/Behavioral: The patient is nervous/anxious.        Objective:   Physical Exam Show avascular status noted on the left leg patient did present with the air fracture cast in place which was removed at this time. Has some slight bruising over the first metatarsal phalangeal joint area although not significant patient generally has grossly normal foot: Temperature absent hair growth and skin texture turgor Refill timed 3-4 seconds all digits. Then Pat pedal pulses are palpable DP +2/4 PT plus one over 4 bilateral epicritic and proprioceptive sensations or intact however extremely hypersensitive patient has extreme hyperesthesia on palpation medial lateral ankle area of the dorsum of the foot the digits any movement patient has extreme sensitivity and allodynia noted. Again the temperature is cool absent hair growth new x-rays taken at this time patient is status post surgery on both ankles has had peroneal tendon repairs first on the right foot with Donnie and since however the left foot has resulted in persistent pain in symptomology both the medial lateral ankle areas dorsal foot which is unrelated to her surgery site into the digits as well cannot rule out the possibility at this time of a chronic regional pain syndrome.       Assessment & Plan:  Assessment pain disproportionate to the level of injury or trauma patient had a relatively minor tendon repair with mobilization and  subsequent is developed severe pain dorsal foot medial lateral ankle areas temperature is cool there is no excessive edema noted although there is some edema with immobilization and dependency. At this time suggested resuming back in the air fracture boot patient is on a variety of medicines including gabapentin and other nerve medications an antidepressant medicines. She is also taking in the past hydrocodone for pain from Dr. Osie Bond which she is currently out of refill of her prescription for hydrocodone acetaminophen at this time. Also made arrangements for a MRI to evaluate the bone and soft tissue structures the foot and ankle rule out possible use of RSD chronic regional pain syndrome versus a stress fracture stress crack or some other abnormality. However the symptoms appear be more neurogenic in nature with severe hyperesthesia. Reevaluate by Dr. edge and with the next 2-3 weeks once the MRI has been done. Maintain air fracture boot at all times in the interim. Also suggested ice pack if needed next  Harriet Masson DPM

## 2013-12-07 ENCOUNTER — Other Ambulatory Visit: Payer: Medicaid Other

## 2013-12-10 ENCOUNTER — Other Ambulatory Visit: Payer: Medicaid Other

## 2013-12-15 ENCOUNTER — Inpatient Hospital Stay: Admission: RE | Admit: 2013-12-15 | Payer: Medicaid Other | Source: Ambulatory Visit

## 2013-12-15 ENCOUNTER — Ambulatory Visit
Admission: RE | Admit: 2013-12-15 | Discharge: 2013-12-15 | Disposition: A | Discharge status: Home / Self Care | Payer: Medicaid Other | Source: Ambulatory Visit

## 2013-12-15 DIAGNOSIS — M79609 Pain in unspecified limb: Secondary | ICD-10-CM

## 2013-12-18 ENCOUNTER — Encounter: Payer: Self-pay | Admitting: Podiatrist

## 2013-12-18 ENCOUNTER — Ambulatory Visit (INDEPENDENT_AMBULATORY_CARE_PROVIDER_SITE_OTHER): Payer: Medicaid Other | Admitting: Podiatrist

## 2013-12-18 VITALS — BP 126/74 | HR 74 | Resp 18

## 2013-12-18 DIAGNOSIS — Z9889 Other specified postprocedural states: Secondary | ICD-10-CM

## 2013-12-18 DIAGNOSIS — L03039 Cellulitis of unspecified toe: Secondary | ICD-10-CM

## 2013-12-18 DIAGNOSIS — M9420 Chondromalacia, unspecified site: Secondary | ICD-10-CM

## 2013-12-18 DIAGNOSIS — M7750 Other enthesopathy of unspecified foot: Secondary | ICD-10-CM

## 2013-12-18 DIAGNOSIS — M659 Synovitis and tenosynovitis, unspecified: Secondary | ICD-10-CM

## 2013-12-18 MED ORDER — DIAZEPAM 10 MG PO TABS: 10.0000 mg | ORAL_TABLET | Freq: Every evening | ORAL | Status: DC | PRN

## 2013-12-18 MED ORDER — HYDROCODONE-ACETAMINOPHEN 5-325 MG PO TABS: 2.0000 | ORAL_TABLET | ORAL | Status: DC | PRN

## 2013-12-18 NOTE — Progress Notes (Signed)
Subjective:  Patient presents today for MRI results of the left ankle/ foot.  She states it still hurts and she is experiencing pain in her boot which she states she has been wearing.  She denies any change in the type of pain she is experiencing and relates she is also having severe pain on the left great toenail.   Objective:  Continued pain in all areas of the left foot and ankle.  Pain subjectively is related along the entire lateral aspect of the ankle.  Vascular status is intact with palpable pulses.  Left hallux nail is very thick and incurvated.  Minimal redness is present around the nail bed and proximal portion of the toenail.  No drainage or cellulitis is present.    MRI findings:   IMPRESSION:  1. New patchy edema in the distal tibia and fibula and talus. 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 The pattern is not typical for classic complex  regional pain syndrome since most of the tarsal bones do not  demonstrate patchy edema.  2. 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 Increased chondromalacia of the tibial plafond  Assessment:  Questionable early complex regional pain syndrome,  Ankle joint effusion and chondromalacia of tibial plafond  Plan:  Recommended a referral for an ankle evaluation and possible evaluation and treatment of the talar dome lesion.  Patient may be candidate for physical therapy as well.  Will set up a referral for Dr Nigel Sloop Dial and refilled her pain medication for her today. Recommended she stay in her boot.  Will reappoint for nail avulsion procedure at the patient's request as she was not prepared to deal with a nail procedure at today's visit.  I will see her back for the nail procedure next week.

## 2013-12-19 ENCOUNTER — Other Ambulatory Visit: Payer: Self-pay | Admitting: Family Medicine

## 2013-12-21 ENCOUNTER — Other Ambulatory Visit: Payer: Self-pay | Admitting: *Deleted

## 2013-12-21 NOTE — Telephone Encounter (Signed)
Needs office visit soon for additional refills since she has not been seen since May 2014.

## 2013-12-22 ENCOUNTER — Encounter (INDEPENDENT_AMBULATORY_CARE_PROVIDER_SITE_OTHER): Payer: Self-pay

## 2013-12-22 ENCOUNTER — Other Ambulatory Visit: Payer: Self-pay | Admitting: *Deleted

## 2013-12-22 ENCOUNTER — Ambulatory Visit (INDEPENDENT_AMBULATORY_CARE_PROVIDER_SITE_OTHER): Payer: Medicaid Other | Admitting: Nurse Practitioner

## 2013-12-22 ENCOUNTER — Encounter: Payer: Self-pay | Admitting: Nurse Practitioner

## 2013-12-22 VITALS — BP 106/72 | HR 62 | Ht 66.0 in | Wt 195.0 lb

## 2013-12-22 DIAGNOSIS — G40309 Generalized idiopathic epilepsy and epileptic syndromes, not intractable, without status epilepticus: Secondary | ICD-10-CM

## 2013-12-22 DIAGNOSIS — R51 Headache: Secondary | ICD-10-CM

## 2013-12-22 DIAGNOSIS — Z79899 Other long term (current) drug therapy: Secondary | ICD-10-CM

## 2013-12-22 MED ORDER — TOPIRAMATE 50 MG PO TABS: 50.0000 mg | ORAL_TABLET | Freq: Two times a day (BID) | ORAL | Status: DC

## 2013-12-22 NOTE — Patient Instructions (Signed)
Will check labs, CBC, CMP and PB level Continue Topamax 50mg  twice daily Continue Phenobarb 100mg  daily F/U in 6 months

## 2013-12-22 NOTE — Progress Notes (Signed)
I have read the note, and I agree with the clinical assessment and plan.  WILLIS,CHARLES KEITH   

## 2013-12-22 NOTE — Progress Notes (Signed)
[facility]  PATIENT: [name] DOB: April 13, 1951   REASON FOR VISIT: Followup for seizure disorder and headache   HISTORY OF PRESENT ILLNESS:[name], 63 year old female returns for followup. She has a history of seizure disorder as well as headaches. Last seizure occurred June 2013. She is currently on phenobarbital. 100mg  at night. He is also on Topamax 50 twice daily headaches. She had recent left foot surgery and is in a boot.  She is due for some further surgery this Friday. Headaches have improved with the addition of Topamax, headaches are bitemporal and at  vertex of the head. She may have nausea and vomiting with headache. She no longer drives a car. She returns for reevaluation  HISTORY: of seizure events and a left hemisensory deficit. The patient indicates that she has gone 15 years without a seizure, and she suffered a seizure event in June 2013. The patient has been on phenobarbital, and she indicates that she has not missed a dose of the medication. In 2007, the patient had a MRI the brain that was normal. The patient indicates that over the last year, she began having headaches that are bitemporal and at the vertex of the head, associated with a throbbing pain. The headaches have gotten better with the addition of Topamax. The patient may have nausea and vomiting with the headache. The patient indicates that she does not drive a car. The patient continues to have left-sided numbness, and she feels somewhat weak on the left side. The patient has her left leg in a cast. She is ambulating with a cane. The patient has significant degenerative arthritis of the knees, and she has low back pain. 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 EEG was normal.  REVIEW OF SYSTEMS: Full 14 system review of systems performed and notable only for those listed, all others are neg:  Constitutional: N/A  Cardiovascular: N/A  Ear/Nose/Throat:  N/A  Skin: N/A  Eyes: N/A  Respiratory: N/A  Gastroitestinal: N/A  Hematology/Lymphatic: N/A  Endocrine: N/A Musculoskeletal:N/A  Allergy/Immunology: N/A  Neurological: dizziness Psychiatric: N/A   ALLERGIES: Allergies  Allergen Reactions  . Amoxicillin     REACTION: hives  . Aspirin     REACTION: rash  . Codeine Phosphate     REACTION: N/V  . Flexeril [Cyclobenzaprine] Nausea And Vomiting    Dizziness   . Penicillins     REACTION: hives  . Prednisone     REACTION: pshycosis  . Tetanus Toxoids Swelling    HOME MEDICATIONS: Outpatient Prescriptions Prior to Visit  Medication Sig Dispense Refill  . amLODipine (NORVASC) 10 MG tablet TAKE 1 TABLET BY MOUTH ONCE A DAY  30 tablet  2  . amLODipine (NORVASC) 10 MG tablet TAKE 1 TABLET BY MOUTH ONCE A DAY  30 tablet  3  . carvedilol (COREG) 3.125 MG tablet TAKE 1 TABLET BY MOUTH TWICE A DAY WITH A MEAL  62 tablet  2  . cephALEXin (KEFLEX) 500 MG capsule Take 1 capsule (500 mg total) by mouth 3 (three) times daily.  30 capsule  0  . citalopram (CELEXA) 20 MG tablet TAKE 1 TABLET BY MOUTH ONCE A DAY  30 tablet  3  . citalopram (CELEXA) 20 MG tablet TAKE 1 TABLET BY MOUTH ONCE A DAY  30 tablet  1  . diazepam (VALIUM) 10 MG tablet Take 1 tablet (10 mg total) by mouth at bedtime as needed for anxiety (take 1 tab 1 hour  prior to procedure-- bring the bottle with you to the office).  5 tablet  0  . diazepam (VALIUM) 5 MG tablet Take 1 tablet (5 mg total) by mouth at bedtime as needed (MUSCLE SPASMS).  30 tablet  2  . gabapentin (NEURONTIN) 300 MG capsule TAKE 3 CAPSULES BY MOUTH TWICE A DAY  180 capsule  0  . HYDROcodone-acetaminophen (NORCO) 5-325 MG per tablet Take 2 tablets by mouth every 4 (four) hours as needed.  90 tablet  0  . Ipratropium-Albuterol (COMBIVENT RESPIMAT) 20-100 MCG/ACT AERS respimat Inhale 1 puff into the lungs every 6 (six) hours.      . NONFORMULARY OR COMPOUNDED ITEM Apply 1 application topically daily. Applies  to right foot. Compound contains: Ketoprofen 5%;Gabapentin 2%;Amitriptylline 2%;Lidocaine 2%.      . omeprazole (PRILOSEC) 20 MG capsule Take 1 capsule (20 mg total) by mouth daily.  30 capsule  0  . PHENObarbital (LUMINAL) 100 MG tablet Take 1 tablet (100 mg total) by mouth daily.  30 tablet  5  . topiramate (TOPAMAX) 25 MG tablet Take 2 tablets (50 mg total) by mouth 2 (two) times daily.  120 tablet  6   No facility-administered medications prior to visit.    PAST MEDICAL HISTORY: Past Medical History  Diagnosis Date  . Hypertension   . Hyperlipidemia   . 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  . COPD (chronic obstructive pulmonary disease)   . Depression   . Personality disorder   . Chronic low back pain   . Sleep apnea     Mild. No need for CPAP.  Marland Kitchen History of CVA (cerebrovascular accident)   . GERD (gastroesophageal reflux disease)   . Migraine   . Stroke     PAST SURGICAL HISTORY: Past Surgical History  Procedure Laterality Date  . Appendectomy    . Shoulder surgery    . Back surgery    . Cardiac catheterization  2008    Normal. Dr Acie Fredrickson.  . Knee surgery      Bilateral.  . Ankle surgery Left     FAMILY HISTORY: Family History  Problem Relation Age of Onset  . Cirrhosis Mother   . Diabetes Mother   . Cirrhosis Father   . Cancer Sister   . Cancer Sister     breast    SOCIAL HISTORY: History   Social History  . Marital Status: Married    Spouse Name: [name]     Number of Children: 1  . Years of Education: 11   Occupational History  . Unemployed     Social History Main Topics  . Smoking status: Former Research scientist (life sciences)  . Smokeless tobacco: Former Systems developer    Quit date: 11/03/2010  . Alcohol Use: No  . Drug Use: No  . Sexual Activity: Not on file   Other Topics Concern  . Not on file   Social History Narrative   Patient lives at home with her husband [name].    Patient is currently not working.    Patient has 1  child.    Patient has a 11th grade education.      PHYSICAL EXAM  Filed Vitals:   12/22/13 1407  BP: 106/72  Pulse: 62  Height: 5\' 6"  (1.676 m)  Weight: 195 lb (88.451 kg)   Body mass index is 31.49 kg/(m^2).  Generalized: Well developed, moderately obese female in no acute distress  Head: normocephalic and atraumatic,. Oropharynx benign  Neck: Supple,  no carotid bruits  Cardiac: Regular rate rhythm, no murmur  Musculoskeletal: Left lower leg boot status post surgery   Neurological examination   Mentation: Alert oriented to time, place, history taking. Follows all commands speech and language fluent  Cranial nerve II-XII: Pupils were equal round reactive to light extraocular movements were full, visual field were full on confrontational test. Facial sensation and strength were normal. hearing was intact to finger rubbing bilaterally. Uvula tongue midline. head turning and shoulder shrug were normal and symmetric.Tongue protrusion into cheek strength was normal. Motor: normal bulk and tone, full strength in the BUE, BLE, except left lower leg which is in a brace. She is partial weightbearing of the left leg Sensory: normal and symmetric to light touch, pinprick, and  vibration  in 3 extremities, unable to check left leg Coordination: finger-nose-finger, heel-to-shin bilaterally, accurately on the right no dysmetria Reflexes: Decompressed bilaterally upper and lower Gait and Station: Rising up from seated position without assistance, limping gait on the left, tandem gait not attempted ambulates with a single-point cane. Romberg negative  DIAGNOSTIC DATA (LABS, IMAGING, TESTING) - I reviewed patient records, labs, notes, testing and imaging myself where available.  Lab Results  Component Value Date   WBC 7.3 05/27/2013   HGB 10.7* 05/27/2013   HCT 33.9* 05/27/2013   MCV 82 05/27/2013   PLT 303 08/26/2012      Component Value Date/Time   NA 142 05/27/2013 1600   NA 142 08/26/2012 1520     K 4.9 05/27/2013 1600   CL 105 05/27/2013 1600   CO2 26 05/27/2013 1600   GLUCOSE 104* 05/27/2013 1600   GLUCOSE 85 08/26/2012 1520   BUN 17 05/27/2013 1600   BUN 15 08/26/2012 1520   CREATININE 0.74 05/27/2013 1600   CREATININE 0.76 08/26/2012 1520   CALCIUM 9.2 05/27/2013 1600   PROT 6.9 05/27/2013 1600   PROT 7.1 08/26/2012 1520   ALBUMIN 4.0 08/26/2012 1520   AST 13 05/27/2013 1600   ALT 11 05/27/2013 1600   ALKPHOS 92 05/27/2013 1600   BILITOT 0.1 05/27/2013 1600   GFRNONAA 86 05/27/2013 1600   GFRAA 100 05/27/2013 1600        ASSESSMENT AND PLAN  63 y.o. year old female  has a past medical history of Hypertension; Hyperlipidemia; Conversion disorder with seizures or convulsions; Depression; Personality disorder; Chronic low back pain; Sleep apnea; History of CVA (cerebrovascular accident);  Migraine; here to followup. Last seizure in June 2013  Will check labs, CBC, CMP and PB level Continue Topamax 50mg  twice daily Continue Phenobarb 100mg  daily F/U in 6 months Dennie Bible, [facility], [facility], APRN  [facility] [address] [place], [place] [address] [phone]

## 2013-12-23 ENCOUNTER — Other Ambulatory Visit: Payer: Self-pay | Admitting: *Deleted

## 2013-12-23 DIAGNOSIS — M79606 Pain in leg, unspecified: Secondary | ICD-10-CM

## 2013-12-23 LAB — COMPREHENSIVE METABOLIC PANEL
A/G RATIO: 1.8 (ref 1.1–2.5)
ALT: 12 IU/L (ref 0–32)
AST: 17 IU/L (ref 0–40)
Albumin: 4.4 g/dL (ref 3.6–4.8)
Alkaline Phosphatase: 102 IU/L (ref 39–117)
BUN/Creatinine Ratio: 20 (ref 11–26)
BUN: 13 mg/dL (ref 8–27)
CO2: 24 mmol/L (ref 18–29)
Calcium: 9.3 mg/dL (ref 8.7–10.3)
Chloride: 104 mmol/L (ref 97–108)
Creatinine, Ser: 0.65 mg/dL (ref 0.57–1.00)
GFR calc Af Amer: 109 mL/min/{1.73_m2} (ref >59)
GFR, EST NON AFRICAN AMERICAN: 94 mL/min/{1.73_m2} (ref >59)
Globulin, Total: 2.5 g/dL (ref 1.5–4.5)
Glucose: 130 mg/dL — ABNORMAL HIGH (ref 65–99)
POTASSIUM: 4.7 mmol/L (ref 3.5–5.2)
SODIUM: 145 mmol/L — AB (ref 134–144)
Total Bilirubin: 0.2 mg/dL (ref 0.0–1.2)
Total Protein: 6.9 g/dL (ref 6.0–8.5)

## 2013-12-23 LAB — CBC WITH DIFFERENTIAL/PLATELET
BASOS ABS: 0 10*3/uL (ref 0.0–0.2)
Basos: 1 %
EOS ABS: 0.1 10*3/uL (ref 0.0–0.4)
Eos: 2 %
HCT: 37.7 % (ref 34.0–46.6)
Hemoglobin: 12.1 g/dL (ref 11.1–15.9)
IMMATURE GRANS (ABS): 0 10*3/uL (ref 0.0–0.1)
Immature Granulocytes: 0 %
Lymphocytes Absolute: 2.9 10*3/uL (ref 0.7–3.1)
Lymphs: 39 %
MCH: 26.4 pg — AB (ref 26.6–33.0)
MCHC: 32.1 g/dL (ref 31.5–35.7)
MCV: 82 fL (ref 79–97)
MONOS ABS: 0.5 10*3/uL (ref 0.1–0.9)
Monocytes: 6 %
NEUTROS PCT: 52 %
Neutrophils Absolute: 3.8 10*3/uL (ref 1.4–7.0)
RBC: 4.58 x10E6/uL (ref 3.77–5.28)
RDW: 15.2 % (ref 12.3–15.4)
WBC: 7.3 10*3/uL (ref 3.4–10.8)

## 2013-12-23 LAB — PHENOBARBITAL LEVEL: Phenobarbital Lvl: 16 ug/mL (ref 15–40)

## 2013-12-23 NOTE — Progress Notes (Signed)
Quick Note:  Shared normal labs with patient, she verbalized understanding ______ 

## 2013-12-24 ENCOUNTER — Telehealth: Payer: Self-pay | Admitting: *Deleted

## 2013-12-24 NOTE — Telephone Encounter (Signed)
Mailed all [facility] visit notes, CD of x-ray copies and MRIs to [facility], [address] [place], [place] [address].

## 2013-12-25 ENCOUNTER — Encounter: Payer: Self-pay | Admitting: Podiatrist

## 2013-12-25 ENCOUNTER — Ambulatory Visit (INDEPENDENT_AMBULATORY_CARE_PROVIDER_SITE_OTHER): Payer: Medicaid Other | Admitting: Podiatrist

## 2013-12-25 VITALS — BP 108/71 | HR 74 | Resp 18

## 2013-12-25 DIAGNOSIS — L600 Ingrowing nail: Secondary | ICD-10-CM

## 2013-12-25 DIAGNOSIS — Z9889 Other specified postprocedural states: Secondary | ICD-10-CM

## 2013-12-25 DIAGNOSIS — M7750 Other enthesopathy of unspecified foot: Secondary | ICD-10-CM

## 2013-12-25 DIAGNOSIS — M659 Synovitis and tenosynovitis, unspecified: Secondary | ICD-10-CM

## 2013-12-25 MED ORDER — HYDROCODONE-ACETAMINOPHEN 10-325 MG PO TABS: 1.0000 | ORAL_TABLET | Freq: Three times a day (TID) | ORAL | Status: DC | PRN

## 2013-12-25 NOTE — Patient Instructions (Signed)
Soak Instructions    THE DAY AFTER THE PROCEDURE  Place 1/4 cup of epsom salts in a quart of warm tap water.  Submerge your foot or feet with outer bandage intact for the initial soak; this will allow the bandage to become moist and wet for easy lift off.  Once you remove your bandage, continue to soak in the solution for 20 minutes.  This soak should be done twice a day.  Next, remove your foot or feet from solution, blot dry the affected area and cover.  You may use a band aid large enough to cover the area or use gauze and tape.  Apply other medications to the area as directed by the doctor such as polysporin neosporin.  IF YOUR SKIN BECOMES IRRITATED WHILE USING THESE INSTRUCTIONS, IT IS OKAY TO SWITCH TO  WHITE VINEGAR AND WATER. Or you may use antibacterial soap and water to keep the toe clean    

## 2013-12-25 NOTE — Progress Notes (Signed)
Subjective: Patient presents today for an ingrown toenail procedure of the left great toe. She states "I am still hurting on my left ankle and I have an appointment with my doctor on Monday 12-28-13 at 2:30 pm and to get me in to Dr. Delora Fuel sometime next week and I have an ingrown on my left big toenail".  She states the left great toenail does not hurt as badly today as it did last week and that she did preset it herself with a valium prescription I wrote for her  Chief Complaint  Patient presents with  . Foot Problem    left ankle     HPI: Patient is 63 y.o. female who presents today for ingrown toenail procedure on her left great toenail.  Relates pain on the medial and lateral corners of the toenail     Physical Exam Neurovascular status is unchanged with palpable pedal pulses at 2/4 DP and PT left. No redness, no swelling, no signs of infection are present left hallux. Left hallux nail is significantly thick and incurvated with pain on both borders. Continued diffuse pain along the left ankle is noted.   Assessment: Ingrown medial and lateral nail borders left hallux  Plan:Treatment options and alternatives discussed.  Recommended permanent phenol matrixectomy and patient agreed.  Left hallux was prepped with alcohol and a 1 to 1 mix of 0.5% marcaine plain and 2% lidocaine plain was administered in a digital block fashion.  The toe was then prepped with betadine solution and exsanguinated.  The offending nail border was then excised and matrix tissue exposed.  Phenol was then applied to the matrix tissue followed by an alcohol wash.  Antibiotic ointment and a dry sterile dressing was applied.  The patient was dispensed instructions for aftercare.        Left hallux nail ap nail.  Both borders

## 2013-12-28 ENCOUNTER — Ambulatory Visit (INDEPENDENT_AMBULATORY_CARE_PROVIDER_SITE_OTHER): Payer: Medicaid Other | Admitting: Family Medicine

## 2013-12-28 ENCOUNTER — Encounter: Payer: Self-pay | Admitting: Family Medicine

## 2013-12-28 VITALS — BP 117/80 | HR 75 | Ht 66.0 in | Wt 203.0 lb

## 2013-12-28 DIAGNOSIS — M79672 Pain in left foot: Secondary | ICD-10-CM

## 2013-12-28 DIAGNOSIS — M79609 Pain in unspecified limb: Secondary | ICD-10-CM

## 2013-12-28 DIAGNOSIS — L304 Erythema intertrigo: Secondary | ICD-10-CM

## 2013-12-28 DIAGNOSIS — L301 Dyshidrosis [pompholyx]: Secondary | ICD-10-CM

## 2013-12-28 DIAGNOSIS — Z1211 Encounter for screening for malignant neoplasm of colon: Secondary | ICD-10-CM

## 2013-12-28 DIAGNOSIS — Z0000 Encounter for general adult medical examination without abnormal findings: Secondary | ICD-10-CM

## 2013-12-28 DIAGNOSIS — L538 Other specified erythematous conditions: Secondary | ICD-10-CM

## 2013-12-28 LAB — POCT GLYCOSYLATED HEMOGLOBIN (HGB A1C): Hemoglobin A1C: 5.6

## 2013-12-28 MED ORDER — TRIAMCINOLONE ACETONIDE 0.5 % EX OINT: 1.0000 "application " | TOPICAL_OINTMENT | Freq: Two times a day (BID) | CUTANEOUS | Status: DC

## 2013-12-28 MED ORDER — NYSTATIN 100000 UNIT/GM EX POWD: CUTANEOUS | Status: DC

## 2013-12-28 NOTE — Patient Instructions (Signed)
It was great seeing you today!  Please return to the clinic for a lab appointment only. You can make the appointment by calling a couple of days prior to the day you would like to get your labs drawn.  Please come fasting for the labwork: no food or drink other than water after midnight    Please get a mammogram as soon as you can.  I am going to refer you to the gastroenterologist for evaluation for colonoscopy.  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 I will send a prescription for that as well.   Intertrigo Intertrigo is a skin condition that occurs in between folds of skin in places on the body that rub together a lot and do not get much ventilation. It is caused by heat, moisture, friction, sweat retention, and lack of air circulation, which produces red, irritated patches and, sometimes, scaling or drainage. People who have diabetes, who are obese, or who have treatment with antibiotics are at increased risk for intertrigo. The most common sites for intertrigo to occur include:  The groin.  The breasts.  The armpits.  Folds of abdominal skin.  Webbed spaces between the fingers or toes. Intertrigo may be aggravated by:  Sweat.  Feces.  Yeast or bacteria that are present near skin folds.  Urine.  Vaginal discharge. HOME CARE INSTRUCTIONS  The following steps can be taken to reduce friction and keep the affected area cool and dry:  Expose skin folds to the air.  Keep deep skin folds separated with cotton or linen cloth. Avoid tight fitting clothing that could cause chafing.  Wear open-toed shoes or sandals to help reduce moisture between the toes.  Apply absorbent powders to affected areas as directed by your caregiver.  Apply over-the-counter barrier pastes, such as zinc oxide, as directed by your caregiver.  If you develop a fungal infection in the  affected area, your caregiver may have you use antifungal creams. SEEK MEDICAL CARE IF:   The rash is not improving after 1 week of treatment.  The rash is getting worse (more red, more swollen, more painful, or spreading).  You have a fever or chills. MAKE SURE YOU:   Understand these instructions.  Will watch your condition.  Will get help right away if you are not doing well or get worse. Document Released: 10/08/2005 Document Revised: 12/31/2011 Document Reviewed: 03/23/2010 [facility] Patient Information 2014 [place].

## 2013-12-29 ENCOUNTER — Telehealth: Payer: Self-pay | Admitting: Family Medicine

## 2013-12-29 ENCOUNTER — Other Ambulatory Visit: Payer: Self-pay | Admitting: Family Medicine

## 2013-12-29 ENCOUNTER — Encounter: Payer: Self-pay | Admitting: Internal Medicine

## 2013-12-29 DIAGNOSIS — Z1231 Encounter for screening mammogram for malignant neoplasm of breast: Secondary | ICD-10-CM

## 2013-12-29 DIAGNOSIS — M79672 Pain in left foot: Secondary | ICD-10-CM | POA: Insufficient documentation

## 2013-12-29 DIAGNOSIS — L301 Dyshidrosis [pompholyx]: Secondary | ICD-10-CM | POA: Insufficient documentation

## 2013-12-29 DIAGNOSIS — Z0000 Encounter for general adult medical examination without abnormal findings: Secondary | ICD-10-CM | POA: Insufficient documentation

## 2013-12-29 DIAGNOSIS — B372 Candidiasis of skin and nail: Secondary | ICD-10-CM | POA: Insufficient documentation

## 2013-12-29 NOTE — Telephone Encounter (Signed)
I called and scheduled patient's mammogram for her, Tuesday January 12, 2014 at 1:15pm.  Pt notified.   Kelbi Renstrom, Loralyn Fresh[place], [place]

## 2013-12-29 NOTE — Progress Notes (Signed)
Patient ID: [name]    DOB: Feb 04, 1951, 63 y.o.   MRN: [id number] --- Subjective:  [name] is a 63 y.o.female with h/o seizure disorder, migraines, hyperlipidemia, COPD, borderline personality disorder and  ho presents for referral request as well as request for blood work and evaluation for rash.  - referral request: patient has been seen by podiatry and had surgery of the medial aspect of the left ankle to repair the posterior tibial tendon. Her peroneal tendons were also repaired. She most recently had a partial toenail removal of the left great toe. She continues to wear a boot and have significant pain. She requests a referral for second opinion to see Dr. Delora Fuel at [facility] in [place]  - request for labwork: she is very concerned about the different types of cancer she could be getting. Her sister died of breast cancer at the age of 65yo. Patient has not had a mammogram since 2008.  She also has not had a colonoscopy in over 10 years. She denies any blood in the stool.    - rash under breast: present for several weeks. Gets red under the right breast. Then becomes a darker color. Rash is itchy. She has used vaseline under breast. No fevers.   - rash on left hand: present for several months. very dry, peels in flakes and itches. Gets cuts in the skin from how dry it gets. She has tried moisturizing lotions but no other medications.    ROS: see HPI Past Medical History: reviewed and updated medications and allergies. Social History: Tobacco:  Objective: Filed Vitals:   12/28/13 1513  BP: 117/80  Pulse: 75    Physical Examination:   General appearance - alert, well appearing, and in no distress Mouth - mucous membranes moist, pharynx normal without lesions, adentulous Neck - supple, no significant adenopathy Chest - clear to auscultation, no wheezes, rales or rhonchi, symmetric air entry Heart - normal rate, regular rhythm, normal S1, S2, no murmurs Extremities - left foot  in boot Skin - under left breast: post inflammatory changes, no erythema or warmth Right hand: changes consistent with dyshydrotic eczema: dry patches on palm of hand

## 2013-12-29 NOTE — Assessment & Plan Note (Addendum)
Recommended mammogram Referral to GI for screening colonoscopy Fasting lipid panel, BMP and A1C.  Follow up in 1-2 months

## 2013-12-29 NOTE — Assessment & Plan Note (Signed)
Chronic issue for patient.  Referral to Dr. Delora Fuel sent.

## 2013-12-29 NOTE — Telephone Encounter (Signed)
Patient called [facility] and was told that she would need an referral in order to get a mammogram done. Please let patient know once referral has been placed.

## 2013-12-29 NOTE — Assessment & Plan Note (Signed)
Nystatin powder.

## 2013-12-29 NOTE — Assessment & Plan Note (Signed)
Rash on hand consistent with dyshidrotic eczema.  - triamcinolone ointment - vaseline at night in gloves overnight.

## 2014-01-12 ENCOUNTER — Ambulatory Visit
Admission: RE | Admit: 2014-01-12 | Discharge: 2014-01-12 | Disposition: A | Discharge status: Home / Self Care | Payer: Self-pay | Source: Ambulatory Visit | Attending: Family Medicine | Admitting: Family Medicine

## 2014-01-12 DIAGNOSIS — Z1231 Encounter for screening mammogram for malignant neoplasm of breast: Secondary | ICD-10-CM

## 2014-01-13 ENCOUNTER — Other Ambulatory Visit: Payer: Self-pay | Admitting: *Deleted

## 2014-01-13 MED ORDER — OMEPRAZOLE 20 MG PO CPDR: 20.0000 mg | DELAYED_RELEASE_CAPSULE | Freq: Every day | ORAL | Status: DC

## 2014-01-18 ENCOUNTER — Telehealth: Payer: Self-pay | Admitting: *Deleted

## 2014-01-18 NOTE — Telephone Encounter (Signed)
I informed the patient that Dr. Valentina Lucks is out of the office this week.  I'm waiting on a response from one of the other doctors.  Once I hear something I will let her know something.

## 2014-01-18 NOTE — Telephone Encounter (Signed)
Calling about pain medication.  The doctor in [facility] said I need to get it from the same doctor I usually get it from.  Can she give me some pain medicine please?  I'll be so appreciative.  Patient was referred to [facility] in [place], [place].

## 2014-01-19 MED ORDER — HYDROCODONE-ACETAMINOPHEN 10-325 MG PO TABS: 1.0000 | ORAL_TABLET | Freq: Three times a day (TID) | ORAL | Status: DC | PRN

## 2014-01-19 NOTE — Telephone Encounter (Signed)
I called and informed her that Dr. Noralyn Pick a refill for Hydrocodone 10mg /325 #60 Sig: 1 po q 8h prn pain, 0 refills.  She said she would come by tomorrow to pick it up.

## 2014-01-20 ENCOUNTER — Telehealth: Payer: Self-pay | Admitting: *Deleted

## 2014-01-20 DIAGNOSIS — Z9889 Other specified postprocedural states: Secondary | ICD-10-CM

## 2014-01-20 DIAGNOSIS — M7750 Other enthesopathy of unspecified foot: Secondary | ICD-10-CM

## 2014-01-20 MED ORDER — HYDROCODONE-ACETAMINOPHEN 5-325 MG PO TABS: 1.0000 | ORAL_TABLET | Freq: Three times a day (TID) | ORAL | Status: DC

## 2014-01-20 NOTE — Telephone Encounter (Signed)
Patient came by the office to pick up her prescription.  She stated she cannot take the Hydrocodone 10mg /325 because it gives her headaches.  I switched the prescription to Hydrocodone 5mg /325.

## 2014-01-25 ENCOUNTER — Other Ambulatory Visit: Payer: Self-pay | Admitting: Family Medicine

## 2014-01-25 ENCOUNTER — Telehealth: Payer: Self-pay | Admitting: Family Medicine

## 2014-01-25 MED ORDER — CITALOPRAM HYDROBROMIDE 20 MG PO TABS: ORAL_TABLET | ORAL | Status: DC

## 2014-01-25 MED ORDER — AMLODIPINE BESYLATE 10 MG PO TABS: ORAL_TABLET | ORAL | Status: DC

## 2014-01-25 MED ORDER — CARVEDILOL 3.125 MG PO TABS: ORAL_TABLET | ORAL | Status: DC

## 2014-01-25 NOTE — Telephone Encounter (Signed)
Refilled citalopram, amlodipine and coreg and sent it to [facility] in [place].   Liam Graham, PGY-3 Family Medicine Resident

## 2014-01-25 NOTE — Telephone Encounter (Signed)
Pt called and needs a refill on her BP medication and also Citalopram. jw

## 2014-01-25 NOTE — Telephone Encounter (Signed)
Will fwd to pcp.  Nakul Avino L, CMA  

## 2014-02-04 ENCOUNTER — Ambulatory Visit: Payer: Medicaid Other | Admitting: Family Medicine

## 2014-02-10 ENCOUNTER — Ambulatory Visit (AMBULATORY_SURGERY_CENTER): Payer: Medicaid Other | Admitting: *Deleted

## 2014-02-10 VITALS — Ht 65.0 in | Wt 200.2 lb

## 2014-02-10 DIAGNOSIS — Z1211 Encounter for screening for malignant neoplasm of colon: Secondary | ICD-10-CM

## 2014-02-10 MED ORDER — NA SULFATE-K SULFATE-MG SULF 17.5-3.13-1.6 GM/177ML PO SOLN: ORAL | Status: DC

## 2014-02-10 NOTE — Progress Notes (Signed)
Patient denies any allergies to eggs or soy. Patient denies any problems with anesthesia. No oxygen use at home per patient. No diet/wt loss pills.  

## 2014-02-11 ENCOUNTER — Other Ambulatory Visit: Payer: Self-pay | Admitting: Family Medicine

## 2014-02-11 ENCOUNTER — Ambulatory Visit (INDEPENDENT_AMBULATORY_CARE_PROVIDER_SITE_OTHER): Payer: Medicaid Other | Admitting: Family Medicine

## 2014-02-11 ENCOUNTER — Encounter: Payer: Self-pay | Admitting: Family Medicine

## 2014-02-11 VITALS — BP 108/70 | HR 72 | Temp 98.8°F | Ht 66.0 in | Wt 199.0 lb

## 2014-02-11 DIAGNOSIS — R197 Diarrhea, unspecified: Secondary | ICD-10-CM

## 2014-02-11 LAB — CBC WITH DIFFERENTIAL/PLATELET
BASOS ABS: 0 10*3/uL (ref 0.0–0.1)
Basophils Relative: 0 % (ref 0–1)
Eosinophils Absolute: 0.2 10*3/uL (ref 0.0–0.7)
Eosinophils Relative: 2 % (ref 0–5)
HCT: 35.6 % — ABNORMAL LOW (ref 36.0–46.0)
Hemoglobin: 11.6 g/dL — ABNORMAL LOW (ref 12.0–15.0)
Lymphocytes Relative: 37 % (ref 12–46)
Lymphs Abs: 2.9 10*3/uL (ref 0.7–4.0)
MCH: 26.3 pg (ref 26.0–34.0)
MCHC: 32.6 g/dL (ref 30.0–36.0)
MCV: 80.7 fL (ref 78.0–100.0)
Monocytes Absolute: 0.7 10*3/uL (ref 0.1–1.0)
Monocytes Relative: 9 % (ref 3–12)
Neutro Abs: 4.1 10*3/uL (ref 1.7–7.7)
Neutrophils Relative %: 52 % (ref 43–77)
Platelets: 270 10*3/uL (ref 150–400)
RBC: 4.41 MIL/uL (ref 3.87–5.11)
RDW: 15.3 % (ref 11.5–15.5)
WBC: 7.9 10*3/uL (ref 4.0–10.5)

## 2014-02-11 LAB — LIPASE: Lipase: 10 U/L (ref 0–75)

## 2014-02-11 LAB — COMPREHENSIVE METABOLIC PANEL
ALT: 11 U/L (ref 0–35)
AST: 14 U/L (ref 0–37)
Albumin: 4.1 g/dL (ref 3.5–5.2)
Alkaline Phosphatase: 90 U/L (ref 39–117)
BILIRUBIN TOTAL: 0.2 mg/dL (ref 0.2–1.2)
BUN: 15 mg/dL (ref 6–23)
CO2: 29 mEq/L (ref 19–32)
Calcium: 8.9 mg/dL (ref 8.4–10.5)
Chloride: 105 mEq/L (ref 96–112)
Creat: 0.61 mg/dL (ref 0.50–1.10)
Glucose, Bld: 87 mg/dL (ref 70–99)
Potassium: 4.2 mEq/L (ref 3.5–5.3)
Sodium: 139 mEq/L (ref 135–145)
Total Protein: 7 g/dL (ref 6.0–8.3)

## 2014-02-11 MED ORDER — ONDANSETRON HCL 4 MG PO TABS: 4.0000 mg | ORAL_TABLET | Freq: Three times a day (TID) | ORAL | Status: DC | PRN

## 2014-02-11 NOTE — Patient Instructions (Addendum)
Please follow up tomorrow.  If you get any worst or start feeling more light headed or weak, please go to the Emergency room.   Take the zofran for the nausea.

## 2014-02-12 ENCOUNTER — Ambulatory Visit (INDEPENDENT_AMBULATORY_CARE_PROVIDER_SITE_OTHER): Payer: Medicaid Other | Admitting: Family Medicine

## 2014-02-12 ENCOUNTER — Other Ambulatory Visit: Payer: Self-pay | Admitting: Family Medicine

## 2014-02-12 ENCOUNTER — Encounter: Payer: Self-pay | Admitting: Family Medicine

## 2014-02-12 VITALS — BP 109/76 | HR 72 | Temp 97.9°F | Ht 66.0 in | Wt 198.0 lb

## 2014-02-12 DIAGNOSIS — D649 Anemia, unspecified: Secondary | ICD-10-CM

## 2014-02-12 DIAGNOSIS — I10 Essential (primary) hypertension: Secondary | ICD-10-CM

## 2014-02-12 DIAGNOSIS — R197 Diarrhea, unspecified: Secondary | ICD-10-CM | POA: Insufficient documentation

## 2014-02-12 LAB — IRON AND TIBC
%SAT: 16 % — AB (ref 20–55)
Iron: 53 ug/dL (ref 42–145)
TIBC: 334 ug/dL (ref 250–470)
UIBC: 281 ug/dL (ref 125–400)

## 2014-02-12 LAB — FERRITIN: Ferritin: 62 ng/mL (ref 10–291)

## 2014-02-12 LAB — TSH: TSH: 2.52 u[IU]/mL (ref 0.350–4.500)

## 2014-02-12 MED ORDER — LOPERAMIDE HCL 2 MG PO TABS: ORAL_TABLET | ORAL | Status: DC

## 2014-02-12 MED ORDER — PROMETHAZINE HCL 25 MG PO TABS: 25.0000 mg | ORAL_TABLET | Freq: Four times a day (QID) | ORAL | Status: DC | PRN

## 2014-02-12 MED ORDER — OMEPRAZOLE 20 MG PO CPDR: 20.0000 mg | DELAYED_RELEASE_CAPSULE | Freq: Every day | ORAL | Status: DC

## 2014-02-12 MED ORDER — CARVEDILOL 3.125 MG PO TABS: ORAL_TABLET | ORAL | Status: DC

## 2014-02-12 NOTE — Patient Instructions (Addendum)
Your labs looked good.  Make sure you keep taking fluids down. I am sending some medicine for the nausea.  Take phenergan and zofran alternating the two to help with the nausea.  Start back taking the immodium:  Initial: 4 mg, followed by 2 mg after each loose stool, up to 16 mg/day  Return to care if worst or not better.   The colonoscopy will likely help [place] figure out what is causing some of this.   We will be in touch to let you know about the stool results.   Stop the amlodipine

## 2014-02-12 NOTE — Assessment & Plan Note (Signed)
Unclear etiology. c-diff is possible. Viral gastroenteritis unlikely to last this long.  - patient doesn't look dehydrated at this time and doesn't need admission at this time.  - get CBC to check for elevated WBC - check CMP for kidney function, electrolytes in setting of diarrhea and LFT's in setting of abdominal pain - lipase - stool c-diff - hold on imaging at this time - treat nausea with zofran. Hold on immodium since unclear whether this is infectious c-diff.  - follow up tomorrow to review labs, monitor symptoms and hydration status.

## 2014-02-12 NOTE — Progress Notes (Signed)
Patient ID: Nazareth Norenberg    DOB: [birth year], 63 y.o.   MRN: [id number] --- Subjective:  Avagail is a 63 y.o.female who presents for follow up on diarrhea which has been ongoing for 3 weeks.  She continues to have 5-6 loose to watery non bloody stools per day. She had one episode of vomiting yesterday. She took zofran which mildly helped.  She has not had anything to eat or drink today but did eat yesterday.  She denies any fevers or chills. She continues to have non focal abdominal pain.   ROS: see HPI Past Medical History: reviewed and updated medications and allergies. Social History: Tobacco: former smoker  Objective: Filed Vitals:   02/12/14 1437  BP: 109/76  Pulse: 72  Temp: 97.9 F (36.6 C)    Physical Examination:   General appearance - alert, tired appearing, and in no distress Mouth - mucous membranes moist, pharynx normal without lesions Heart - normal rate, regular rhythm, normal S1, S2, no murmurs Abdomen - soft, bowel sounds present, tenderness to palpation along left side of umbilicus, incisional hernia present but no change in color or induration. No guarding or rebound.

## 2014-02-12 NOTE — Progress Notes (Addendum)
Patient ID: [name]    DOB: 1950/11/30, 63 y.o.   MRN: [id number] --- Subjective:  [name] is a 63 y.o.female who presents with 3 weeks of diarrhea.  Reports watery stools, 5-6 times per day, non bloody, associated with non focal abdominal pain. Last week had multiple episodes of vomiting as well. Last vomiting episode was yesterday, non bloody, non bilious. She reports diarrhea after eating and drinking. She has been pushing fluids.  She denies any recent antibiotic use. No recent travel. No foods out of the ordinary. No sick contacts. She has not had this before. She tried immodium which did not help. No new meds.   ROS: see HPI Past Medical History: reviewed and updated medications and allergies. Social History: Tobacco: former smoker  Objective: Filed Vitals:   02/11/14 1559  BP: 108/70  Pulse: 72  Temp:     Physical Examination:   General appearance - alert, well appearing, and in no distress Mouth - mucous membranes moist, pharynx normal without lesions Neck - supple, no significant adenopathy Chest - clear to auscultation, no wheezes, rales or rhonchi, symmetric air entry Heart - normal rate, regular rhythm, normal S1, S2, no murmurs Abdomen - soft, tender in suprapubic area, mid line incisional hernia, no redness, no induration.  Rectal - no masses, normal rectal tone, fecal occult negative

## 2014-02-12 NOTE — Assessment & Plan Note (Addendum)
Unclear etiology.  Send stool for c-diff, although with normal WBC it is less likely. Not enough sample for other stool studies. CMP and lipase normal which is reassuring. No evidence of electrolyte imbalance or acute kidney injury from dehydration.  Patient is hemodynamically stable. We offered 500cc bolus in clinic since she had not had anything to drink today, but she refused assuring me she would push fluids.  - alternate zofran and phenergan for nausea - immodium for constipation since likely not infectious source - patient has scheduled colonoscopy in May which may give insight to etiology of diarrhea - abdominal pain unchanged from prior without evidence of acute abdomen. Will hold on imaging.  - return to care if worst abdominal pain or diarrhea or vomiting.

## 2014-02-12 NOTE — Assessment & Plan Note (Signed)
Since patient's BP is in the 287 systolic, will hold norvasc 10mg  daily for now. Continue coreg 3.125 Patient to follow up mid next week for diarrhea and BP check.

## 2014-02-13 LAB — C. DIFFICILE GDH AND TOXIN A/B
C. DIFFICILE GDH: NOT DETECTED
C. difficile Toxin A/B: NOT DETECTED

## 2014-02-22 ENCOUNTER — Encounter: Payer: Self-pay | Admitting: Family Medicine

## 2014-02-24 ENCOUNTER — Ambulatory Visit (AMBULATORY_SURGERY_CENTER): Payer: Medicaid Other | Admitting: Internal Medicine

## 2014-02-24 ENCOUNTER — Encounter: Payer: Self-pay | Admitting: Internal Medicine

## 2014-02-24 VITALS — BP 105/64 | HR 58 | Temp 96.4°F | Resp 11 | Ht 65.0 in | Wt 200.0 lb

## 2014-02-24 DIAGNOSIS — D126 Benign neoplasm of colon, unspecified: Secondary | ICD-10-CM

## 2014-02-24 DIAGNOSIS — R197 Diarrhea, unspecified: Secondary | ICD-10-CM

## 2014-02-24 DIAGNOSIS — Z1211 Encounter for screening for malignant neoplasm of colon: Secondary | ICD-10-CM

## 2014-02-24 DIAGNOSIS — K5730 Diverticulosis of large intestine without perforation or abscess without bleeding: Secondary | ICD-10-CM

## 2014-02-24 MED ORDER — SODIUM CHLORIDE 0.9 % IV SOLN: 500.0000 mL | INTRAVENOUS | Status: DC

## 2014-02-24 NOTE — Patient Instructions (Addendum)
I found and removed one tiny polyp. You have diverticulosis. No cause of diarrhea seen but I took biopsies of the lining to see if that will tell us.  No signs of cancer.  I will let you know pathology results and when to have another routine colonoscopy by mail.  I appreciate the opportunity to care for you. Gatha Mayer, MD, FACG  YOU HAD AN ENDOSCOPIC PROCEDURE TODAY AT [facility]: Refer to the procedure report that was given to you for any specific questions about what was found during the examination.  If the procedure report does not answer your questions, please call your gastroenterologist to clarify.  If you requested that your care partner not be given the details of your procedure findings, then the procedure report has been included in a sealed envelope for you to review at your convenience later.  YOU SHOULD EXPECT: Some feelings of bloating in the abdomen. Passage of more gas than usual.  Walking can help get rid of the air that was put into your GI tract during the procedure and reduce the bloating. If you had a lower endoscopy (such as a colonoscopy or flexible sigmoidoscopy) you may notice spotting of blood in your stool or on the toilet paper. If you underwent a bowel prep for your procedure, then you may not have a normal bowel movement for a few days.  DIET: Your first meal following the procedure should be a light meal and then it is ok to progress to your normal diet.  A half-sandwich or bowl of soup is an example of a good first meal.  Heavy or fried foods are harder to digest and may make you feel nauseous or bloated.  Likewise meals heavy in dairy and vegetables can cause extra gas to form and this can also increase the bloating.  Drink plenty of fluids but you should avoid alcoholic beverages for 24 hours.  ACTIVITY: Your care partner should take you home directly after the procedure.  You should plan to take it easy, moving slowly for the rest of the  day.  You can resume normal activity the day after the procedure however you should NOT DRIVE or use heavy machinery for 24 hours (because of the sedation medicines used during the test).    SYMPTOMS TO REPORT IMMEDIATELY: A gastroenterologist can be reached at any hour.  During normal business hours, 8:30 AM to 5:00 PM Monday through Friday, call [phone].  After hours and on weekends, please call the GI answering service at [phone] who will take a message and have the physician on call contact you.   Following lower endoscopy (colonoscopy or flexible sigmoidoscopy):  Excessive amounts of blood in the stool  Significant tenderness or worsening of abdominal pains  Swelling of the abdomen that is new, acute  Fever of 100F or higher    FOLLOW UP: If any biopsies were taken you will be contacted by phone or by letter within the next 1-3 weeks.  Call your gastroenterologist if you have not heard about the biopsies in 3 weeks.  Our staff will call the home number listed on your records the next business day following your procedure to check on you and address any questions or concerns that you may have at that time regarding the information given to you following your procedure. This is a courtesy call and so if there is no answer at the home number and we have not heard from you through the  emergency physician on call, we will assume that you have returned to your regular daily activities without incident.  SIGNATURES/CONFIDENTIALITY: You and/or your care partner have signed paperwork which will be entered into your electronic medical record.  These signatures attest to the fact that that the information above on your After Visit Summary has been reviewed and is understood.  Full responsibility of the confidentiality of this discharge information lies with you and/or your care-partner.  Polyp and diverticulosis information given.

## 2014-02-24 NOTE — Op Note (Signed)
[facility]  [address] [place], [id number]   COLONOSCOPY PROCEDURE REPORT  PATIENT: [name], [name]  MR#: [id number] BIRTHDATE: 1950-11-16 , 62  yrs. old GENDER: Female ENDOSCOPIST: Gatha Mayer, MD, [facility] REFERRED BY:   Liam Graham, MD PROCEDURE DATE:  02/24/2014 PROCEDURE:   Colonoscopy with biopsy First Screening Colonoscopy - Avg.  risk and is 50 yrs.  old or older - No.  Prior Negative Screening - Now for repeat screening. 10 or more years since last screening  History of Adenoma - Now for follow-up colonoscopy & has been > or = to 3 yrs.  N/A  Polyps Removed Today? Yes. ASA CLASS:   Class III INDICATIONS:Colorectal cancer screening and unexplained diarrhea. MEDICATIONS: propofol (Diprivan) 200mg  IV, MAC sedation, administered by CRNA, and These medications were titrated to patient response per physician's verbal order  DESCRIPTION OF PROCEDURE:   After the risks benefits and alternatives of the procedure were thoroughly explained, informed consent was obtained.  A digital rectal exam revealed no abnormalities of the rectum.   The LB [id number]  endoscope was introduced through the anus and advanced to the terminal ileum which was intubated for a short distance. No adverse events experienced.   The quality of the prep was Suprep adequate  The instrument was then slowly withdrawn as the colon was fully examined.  COLON FINDINGS: A diminutive sessile polyp was found in the ascending colon.  A polypectomy was performed with cold forceps. The resection was complete and the polyp tissue was completely retrieved.   Severe diverticulosis was noted in the sigmoid colon. The mucosa appeared normal in the terminal ileum.   The colon mucosa was otherwise normal.  Retroflexed views revealed no abnormalities. The time to cecum=3 minutes 08 seconds.  Withdrawal time=11 minutes 45 seconds.  The scope was withdrawn and the procedure  completed. COMPLICATIONS: There were no complications.  ENDOSCOPIC IMPRESSION: 1.   Diminutive sessile polyp was found in the ascending colon; polypectomy was performed with cold forceps 2.   Severe diverticulosis was noted in the sigmoid colon 3.   Normal mucosa in the terminal ileum 4.   The colon mucosa was otherwise normal  RECOMMENDATIONS: 1.  Timing of repeat colonoscopy will be determined by pathology findings. 2.   No cause of diarrhea seen - await random biopsies - sounds most like IBS  eSigned:  Gatha Mayer, MD, [facility] 02/24/2014 9:39 AM  cc: The Patient    and Liam Graham, MD

## 2014-02-24 NOTE — Progress Notes (Signed)
Report to pacu rn, vss, bbs=clear 

## 2014-02-25 ENCOUNTER — Other Ambulatory Visit: Payer: Self-pay | Admitting: Family Medicine

## 2014-02-25 ENCOUNTER — Telehealth: Payer: Self-pay | Admitting: *Deleted

## 2014-02-25 NOTE — Telephone Encounter (Signed)
  Follow up Call-  Call back number 02/24/2014  Post procedure Call Back phone  # [phone]  Permission to leave phone message Yes     Patient questions:  Do you have a fever, pain , or abdominal swelling? no Pain Score  0 *  Have you tolerated food without any problems? yes  Have you been able to return to your normal activities? yes  Do you have any questions about your discharge instructions: Diet   no Medications  no Follow up visit  no  Do you have questions or concerns about your Care? no  Actions: * If pain score is 4 or above: No action needed, pain <4.

## 2014-03-01 ENCOUNTER — Encounter: Payer: Self-pay | Admitting: Internal Medicine

## 2014-03-01 DIAGNOSIS — Z860100 Personal history of colon polyps, unspecified: Secondary | ICD-10-CM

## 2014-03-01 HISTORY — DX: Personal history of colon polyps, unspecified: Z86.0100

## 2014-03-01 HISTORY — DX: Personal history of colonic polyps: Z86.010

## 2014-03-01 NOTE — Progress Notes (Signed)
Quick Note:  Diminutive adenoma NL random bxs Repeat colon2020 ______

## 2014-04-16 ENCOUNTER — Other Ambulatory Visit: Payer: Self-pay | Admitting: Family Medicine

## 2014-05-13 ENCOUNTER — Other Ambulatory Visit: Payer: Self-pay | Admitting: *Deleted

## 2014-05-14 ENCOUNTER — Other Ambulatory Visit: Payer: Self-pay

## 2014-05-14 ENCOUNTER — Other Ambulatory Visit: Payer: Self-pay | Admitting: Neurology

## 2014-05-14 MED ORDER — PHENOBARBITAL 100 MG PO TABS: 100.0000 mg | ORAL_TABLET | Freq: Every day | ORAL | Status: DC

## 2014-05-14 MED ORDER — CITALOPRAM HYDROBROMIDE 20 MG PO TABS: ORAL_TABLET | ORAL | Status: DC

## 2014-05-14 NOTE — Telephone Encounter (Signed)
Rx signed and faxed.

## 2014-06-25 ENCOUNTER — Ambulatory Visit: Payer: Medicaid Other | Admitting: Nurse Practitioner

## 2014-06-30 ENCOUNTER — Other Ambulatory Visit: Payer: Self-pay | Admitting: *Deleted

## 2014-07-01 MED ORDER — OMEPRAZOLE 20 MG PO CPDR: 20.0000 mg | DELAYED_RELEASE_CAPSULE | Freq: Every day | ORAL | Status: DC

## 2014-07-02 ENCOUNTER — Other Ambulatory Visit: Payer: Self-pay | Admitting: *Deleted

## 2014-07-02 MED ORDER — OMEPRAZOLE 20 MG PO CPDR: 20.0000 mg | DELAYED_RELEASE_CAPSULE | Freq: Every day | ORAL | Status: DC

## 2014-07-13 ENCOUNTER — Ambulatory Visit: Payer: Medicaid Other | Admitting: Nurse Practitioner

## 2014-07-13 ENCOUNTER — Telehealth: Payer: Self-pay | Admitting: Nurse Practitioner

## 2014-07-13 NOTE — Telephone Encounter (Signed)
No show for scheduled appointment 

## 2014-07-19 ENCOUNTER — Other Ambulatory Visit: Payer: Self-pay | Admitting: *Deleted

## 2014-07-20 MED ORDER — TRIAMCINOLONE ACETONIDE 0.5 % EX OINT: TOPICAL_OINTMENT | CUTANEOUS | Status: DC

## 2014-07-22 ENCOUNTER — Other Ambulatory Visit: Payer: Self-pay

## 2014-07-22 DIAGNOSIS — H2640 Unspecified secondary cataract: Secondary | ICD-10-CM | POA: Insufficient documentation

## 2014-07-22 HISTORY — DX: Unspecified secondary cataract: H26.40

## 2014-07-22 MED ORDER — TOPIRAMATE 50 MG PO TABS: 50.0000 mg | ORAL_TABLET | Freq: Two times a day (BID) | ORAL | Status: DC

## 2014-07-22 NOTE — Telephone Encounter (Signed)
Refill sent to last until appt.  

## 2014-08-11 ENCOUNTER — Encounter: Payer: Self-pay | Admitting: Nurse Practitioner

## 2014-08-11 ENCOUNTER — Ambulatory Visit (INDEPENDENT_AMBULATORY_CARE_PROVIDER_SITE_OTHER): Payer: Medicaid Other | Admitting: Nurse Practitioner

## 2014-08-11 VITALS — BP 119/79 | HR 57 | Ht 66.0 in | Wt 204.0 lb

## 2014-08-11 DIAGNOSIS — Z5181 Encounter for therapeutic drug level monitoring: Secondary | ICD-10-CM

## 2014-08-11 DIAGNOSIS — G8929 Other chronic pain: Secondary | ICD-10-CM

## 2014-08-11 DIAGNOSIS — R51 Headache: Secondary | ICD-10-CM

## 2014-08-11 DIAGNOSIS — G40309 Generalized idiopathic epilepsy and epileptic syndromes, not intractable, without status epilepticus: Secondary | ICD-10-CM

## 2014-08-11 MED ORDER — PHENOBARBITAL 100 MG PO TABS: 100.0000 mg | ORAL_TABLET | Freq: Every day | ORAL | Status: DC

## 2014-08-11 MED ORDER — TOPIRAMATE 50 MG PO TABS: 50.0000 mg | ORAL_TABLET | Freq: Two times a day (BID) | ORAL | Status: DC

## 2014-08-11 NOTE — Progress Notes (Signed)
I have read the note, and I agree with the clinical assessment and plan.  Samella Lucchetti KEITH   

## 2014-08-11 NOTE — Patient Instructions (Addendum)
Continue Topamax at current dose will refill Continue phenobarbital at current dose will refill Will check phenobarbital level Reviewed CBC CMP from 02/11/14 Followup yearly and when necessary

## 2014-08-11 NOTE — Progress Notes (Signed)
[facility]  PATIENT: [name] DOB: 09/28/1951   REASON FOR VISIT: Followup for seizure disorder, headaches   HISTORY OF PRESENT ILLNESS:[name], 63 year old female returns for followup. She has a history of seizure disorder as well as headaches. Last seizure occurred June 2013. She is currently on phenobarbital. 100mg  at night. He is also on Topamax 50 twice daily headaches. She continues to have left foot pain from previous surgery . Headaches have improved with the addition of Topamax, headaches are bitemporal and at vertex of the head. She may have nausea and vomiting with headache. She no longer drives a car. She also complains of joint pain in the knees shoulders and neck. She does not exercise. She returns for reevaluation   HISTORY: of seizure events and a left hemisensory deficit. The patient indicates that she has gone 15 years without a seizure, and she suffered a seizure event in June 2013. The patient has been on phenobarbital, and she indicates that she has not missed a dose of the medication. In 2007, the patient had a MRI the brain that was normal. The patient indicates that over the last year, she began having headaches that are bitemporal and at the vertex of the head, associated with a throbbing pain. The headaches have gotten better with the addition of Topamax. The patient may have nausea and vomiting with the headache. The patient indicates that she does not drive a car. The patient continues to have left-sided numbness, and she feels somewhat weak on the left side. The patient has her left leg in a cast. She is ambulating with a cane. The patient has significant degenerative arthritis of the knees, and she has low back pain. 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 EEG was normal.    REVIEW OF SYSTEMS: Full 14 system review of systems performed and notable only for those listed, all others are neg:    Constitutional: N/A  Cardiovascular: N/A  Ear/Nose/Throat: N/A  Skin: N/A  Eyes: Recent surgery for cataract  Gastroitestinal: N/A  Hematology/Lymphatic: N/A  Endocrine: N/A Musculoskeletal: Neck pain  Allergy/Immunology: N/A  Neurological: N/A Psychiatric: N/A Sleep : NA   ALLERGIES: Allergies  Allergen Reactions  . Amoxicillin     REACTION: hives  . Aspirin     REACTION: rash  . Codeine Phosphate     REACTION: N/V  . Flexeril [Cyclobenzaprine] Nausea And Vomiting    Dizziness   . Ibuprofen Hives  . Penicillins     REACTION: hives  . Prednisone     REACTION: pshycosis  . Tetanus Toxoids Swelling    HOME MEDICATIONS: Outpatient Prescriptions Prior to Visit  Medication Sig Dispense Refill  . carvedilol (COREG) 3.125 MG tablet TAKE 1 TABLET BY MOUTH TWICE A DAY WITH A MEAL  62 tablet  6  . citalopram (CELEXA) 20 MG tablet TAKE 1 TABLET BY MOUTH ONCE A DAY  30 tablet  3  . gabapentin (NEURONTIN) 300 MG capsule TAKE 3 CAPSULES BY MOUTH TWICE A DAY  180 capsule  4  . Ipratropium-Albuterol (COMBIVENT RESPIMAT) 20-100 MCG/ACT AERS respimat Inhale 1 puff into the lungs every 6 (six) hours.      Marland Kitchen nystatin (MYCOSTATIN/NYSTOP) 100000 UNIT/GM POWD One application to breast 3 times daily.  60 g  0  . omeprazole (PRILOSEC) 20 MG capsule Take 1 capsule (20 mg total) by mouth daily.  30 capsule  2  . PHENObarbital (LUMINAL) 100 MG tablet Take 1 tablet (  100 mg total) by mouth daily.  30 tablet  5  . topiramate (TOPAMAX) 50 MG tablet Take 1 tablet (50 mg total) by mouth 2 (two) times daily.  60 tablet  0  . loperamide (IMODIUM A-D) 2 MG tablet Initial: 4 mg, followed by 2 mg after each loose stool, up to 16 mg/day  30 tablet  0  . promethazine (PHENERGAN) 12.5 MG tablet Take 1 tablet (12.5 mg total) by mouth every 8 (eight) hours as needed for nausea.  20 tablet  0  . diazepam (VALIUM) 5 MG tablet Take 1 tablet (5 mg total) by mouth at bedtime as needed (MUSCLE SPASMS).  30 tablet  2   . HYDROcodone-acetaminophen (NORCO) 5-325 MG per tablet Take 1 tablet by mouth every 8 (eight) hours.  60 tablet  0  . NONFORMULARY OR COMPOUNDED ITEM Apply 1 application topically daily. Applies to right foot. Compound contains: Ketoprofen 5%;Gabapentin 2%;Amitriptylline 2%;Lidocaine 2%.      . ondansetron (ZOFRAN) 4 MG tablet Take 1 tablet (4 mg total) by mouth every 8 (eight) hours as needed for nausea or vomiting.  20 tablet  0  . promethazine (PHENERGAN) 25 MG tablet Take 1 tablet (25 mg total) by mouth every 6 (six) hours as needed for nausea or vomiting.  30 tablet  0  . triamcinolone ointment (KENALOG) 0.5 % APPLY TO SKIN TWICE DAILY AS DIRECTED  60 g  2   No facility-administered medications prior to visit.    PAST MEDICAL HISTORY: Past Medical History  Diagnosis Date  . Hypertension   . Hyperlipidemia   . 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  . COPD (chronic obstructive pulmonary disease)   . Depression   . Personality disorder   . Chronic low back pain   . Sleep apnea     Mild. No need for CPAP.  Marland Kitchen GERD (gastroesophageal reflux disease)   . Migraine   . Stroke   . History of CVA (cerebrovascular accident)     "25 minni srokes"  . Seizures     last seizure 4 years ago  . Personal history of colonic polyp - adenoma 03/01/2014    PAST SURGICAL HISTORY: Past Surgical History  Procedure Laterality Date  . Appendectomy    . Shoulder surgery      bil.  . Back surgery    . Cardiac catheterization  2008    Normal. Dr Acie Fredrickson.  . Knee surgery      Bilateral.  . Ankle surgery Left   . Foot tendon surgery  june 2014    both feet  . Total abdominal hysterectomy    . Cholecystectomy    . Nose surgery    . Cyst excision      leg  . Bunionectomy      2 toes  . Flank mass excision      FAMILY HISTORY: Family History  Problem Relation Age of Onset  . Cirrhosis Mother   . Diabetes Mother   . Cirrhosis Father   . Breast cancer  Sister   . Breast cancer Sister   . Colon cancer Neg Hx     SOCIAL HISTORY: History   Social History  . Marital Status: Married    Spouse Name: Clearence     Number of Children: 1  . Years of Education: 11   Occupational History  . Unemployed     Social History Main Topics  . Smoking status: Former Research scientist (life sciences)  .  Smokeless tobacco: Former Systems developer    Quit date: 11/03/2010  . Alcohol Use: No  . Drug Use: No  . Sexual Activity: Not on file   Other Topics Concern  . Not on file   Social History Narrative   Patient lives at home with her husband [name].    Patient is currently not working.    Patient has 1 child.    Patient has a 11th grade education.      PHYSICAL EXAM  Filed Vitals:   08/11/14 1455  BP: 119/79  Pulse: 57  Height: 5\' 6"  (1.676 m)  Weight: 204 lb (92.534 kg)   Body mass index is 32.94 kg/(m^2). Generalized: Well developed, moderately obese female in no acute distress  Head: normocephalic and atraumatic,. Oropharynx benign  Neck: Supple, no carotid bruits  Cardiac: Regular rate rhythm, no murmur  Neurological examination  Mentation: Alert oriented to time, place, history taking. Follows all commands speech and language fluent  Cranial nerve II-XII: Pupils were equal round reactive to light extraocular movements were full, visual field were full on confrontational test. Facial sensation and strength were normal. hearing was intact to finger rubbing bilaterally. Uvula tongue midline. head turning and shoulder shrug were normal and symmetric.Tongue protrusion into cheek strength was normal.  Motor: normal bulk and tone, full strength in the BUE, BLE, no focal weakness   Sensory: normal and symmetric to light touch, pinprick, and vibration  Coordination: finger-nose-finger, heel-to-shin bilaterally,  no dysmetria  Reflexes: Depressed bilaterally upper and lower  Gait and Station: Rising up from seated position without assistance, ambulating short distances in  the hall, no difficulty with turns, no assistive device.  Romberg negative   DIAGNOSTIC DATA (LABS, IMAGING, TESTING) - I reviewed patient records, labs, notes, testing and imaging myself where available.  Lab Results  Component Value Date   WBC 7.9 02/11/2014   HGB 11.6* 02/11/2014   HCT 35.6* 02/11/2014   MCV 80.7 02/11/2014   PLT 270 02/11/2014      Component Value Date/Time   NA 139 02/11/2014 1639   NA 145* 12/22/2013 1438   K 4.2 02/11/2014 1639   CL 105 02/11/2014 1639   CO2 29 02/11/2014 1639   GLUCOSE 87 02/11/2014 1639   GLUCOSE 130* 12/22/2013 1438   BUN 15 02/11/2014 1639   BUN 13 12/22/2013 1438   CREATININE 0.61 02/11/2014 1639   CREATININE 0.65 12/22/2013 1438   CALCIUM 8.9 02/11/2014 1639   PROT 7.0 02/11/2014 1639   PROT 6.9 12/22/2013 1438   ALBUMIN 4.1 02/11/2014 1639   AST 14 02/11/2014 1639   ALT 11 02/11/2014 1639   ALKPHOS 90 02/11/2014 1639   BILITOT 0.2 02/11/2014 1639   GFRNONAA 94 12/22/2013 1438   GFRNONAA 84 08/26/2012 1520   GFRAA 109 12/22/2013 1438   GFRAA >89 08/26/2012 1520    Lab Results  Component Value Date   HGBA1C 5.6 12/28/2013    Lab Results  Component Value Date   TSH 2.520 02/11/2014      ASSESSMENT AND PLAN  63 y.o. year old female  has a past medical history of Hypertension; Hyperlipidemia; Conversion disorder with seizures or convulsions; COPD (chronic obstructive pulmonary disease); Depression; Personality disorder; Chronic low back pain; Sleep apnea; GERD (gastroesophageal reflux disease); Migraine; Stroke; History of CVA (cerebrovascular accident); Seizures; here to followup. Last seizure in June 2013   Continue Topamax at current dose will refill Continue phenobarbital at current dose will refill Will check phenobarbital level Reviewed CBC CMP  from 02/11/14, WNL Followup yearly and when necessary Dennie Bible, [facility], [facility], [facility] [address] [place], [place] [id number] [phone]

## 2014-08-12 ENCOUNTER — Inpatient Hospital Stay: Payer: Self-pay | Admitting: Surgery

## 2014-08-12 LAB — PHENOBARBITAL LEVEL: PHENOBARBITAL LVL: 17 ug/mL (ref 15–40)

## 2014-08-12 LAB — CBC
HCT: 41.4 % (ref 35.0–47.0)
HGB: 12.5 g/dL (ref 12.0–16.0)
MCH: 25.6 pg — ABNORMAL LOW (ref 26.0–34.0)
MCHC: 30.2 g/dL — ABNORMAL LOW (ref 32.0–36.0)
MCV: 85 fL (ref 80–100)
Platelet: 158 10*3/uL (ref 150–440)
RBC: 4.89 10*6/uL (ref 3.80–5.20)
RDW: 16.2 % — AB (ref 11.5–14.5)
WBC: 22.9 10*3/uL — AB (ref 3.6–11.0)

## 2014-08-12 LAB — BASIC METABOLIC PANEL
ANION GAP: 11 (ref 7–16)
BUN: 12 mg/dL (ref 7–18)
CALCIUM: 8.3 mg/dL — AB (ref 8.5–10.1)
CHLORIDE: 108 mmol/L — AB (ref 98–107)
CREATININE: 0.71 mg/dL (ref 0.60–1.30)
Co2: 21 mmol/L (ref 21–32)
EGFR (African American): >60
EGFR (Non-African Amer.): >60
Glucose: 109 mg/dL — ABNORMAL HIGH (ref 65–99)
Osmolality: 280 (ref 275–301)
Potassium: 4.1 mmol/L (ref 3.5–5.1)
Sodium: 140 mmol/L (ref 136–145)

## 2014-08-12 LAB — LIPASE, BLOOD: Lipase: 164 U/L (ref 73–393)

## 2014-08-12 LAB — TROPONIN I: Troponin-I: <0.02 ng/mL

## 2014-08-12 LAB — AMYLASE: Amylase: 76 U/L (ref 25–115)

## 2014-08-12 LAB — CK: CK, TOTAL: 44 U/L

## 2014-08-13 LAB — CBC WITH DIFFERENTIAL/PLATELET
BASOS PCT: 0.3 %
Basophil #: 0 10*3/uL (ref 0.0–0.1)
EOS PCT: 0 %
Eosinophil #: 0 10*3/uL (ref 0.0–0.7)
HCT: 32.5 % — ABNORMAL LOW (ref 35.0–47.0)
HGB: 10.1 g/dL — ABNORMAL LOW (ref 12.0–16.0)
LYMPHS ABS: 2.2 10*3/uL (ref 1.0–3.6)
Lymphocyte %: 14.6 %
MCH: 25.7 pg — AB (ref 26.0–34.0)
MCHC: 31.2 g/dL — AB (ref 32.0–36.0)
MCV: 83 fL (ref 80–100)
MONOS PCT: 7.6 %
Monocyte #: 1.1 x10 3/mm — ABNORMAL HIGH (ref 0.2–0.9)
Neutrophil #: 11.5 10*3/uL — ABNORMAL HIGH (ref 1.4–6.5)
Neutrophil %: 77.5 %
Platelet: 192 10*3/uL (ref 150–440)
RBC: 3.94 10*6/uL (ref 3.80–5.20)
RDW: 15.7 % — AB (ref 11.5–14.5)
WBC: 14.9 10*3/uL — ABNORMAL HIGH (ref 3.6–11.0)

## 2014-08-13 LAB — COMPREHENSIVE METABOLIC PANEL
ALBUMIN: 2.8 g/dL — AB (ref 3.4–5.0)
AST: 29 U/L (ref 15–37)
Alkaline Phosphatase: 98 U/L
Anion Gap: 9 (ref 7–16)
BILIRUBIN TOTAL: 0.4 mg/dL (ref 0.2–1.0)
BUN: 12 mg/dL (ref 7–18)
Calcium, Total: 7.9 mg/dL — ABNORMAL LOW (ref 8.5–10.1)
Chloride: 107 mmol/L (ref 98–107)
Co2: 26 mmol/L (ref 21–32)
Creatinine: 0.73 mg/dL (ref 0.60–1.30)
EGFR (African American): >60
GLUCOSE: 104 mg/dL — AB (ref 65–99)
OSMOLALITY: 283 (ref 275–301)
POTASSIUM: 3.7 mmol/L (ref 3.5–5.1)
SGPT (ALT): 34 U/L
SODIUM: 142 mmol/L (ref 136–145)
Total Protein: 6.8 g/dL (ref 6.4–8.2)

## 2014-08-13 LAB — PROTIME-INR
INR: 1.3
Prothrombin Time: 15.9 secs — ABNORMAL HIGH (ref 11.5–14.7)

## 2014-08-13 LAB — MAGNESIUM: Magnesium: 1.9 mg/dL

## 2014-08-13 LAB — APTT: Activated PTT: 28.4 secs (ref 23.6–35.9)

## 2014-08-13 LAB — AMYLASE: AMYLASE: 60 U/L (ref 25–115)

## 2014-08-13 LAB — LIPASE, BLOOD: LIPASE: 90 U/L (ref 73–393)

## 2014-08-14 LAB — COMPREHENSIVE METABOLIC PANEL
ALBUMIN: 2.6 g/dL — AB (ref 3.4–5.0)
ALK PHOS: 91 U/L
ANION GAP: 10 (ref 7–16)
AST: 18 U/L (ref 15–37)
BILIRUBIN TOTAL: 0.3 mg/dL (ref 0.2–1.0)
BUN: 8 mg/dL (ref 7–18)
CALCIUM: 7.9 mg/dL — AB (ref 8.5–10.1)
Chloride: 108 mmol/L — ABNORMAL HIGH (ref 98–107)
Co2: 24 mmol/L (ref 21–32)
Creatinine: 0.59 mg/dL — ABNORMAL LOW (ref 0.60–1.30)
EGFR (African American): >60
EGFR (Non-African Amer.): >60
GLUCOSE: 90 mg/dL (ref 65–99)
Osmolality: 281 (ref 275–301)
Potassium: 3.6 mmol/L (ref 3.5–5.1)
SGPT (ALT): 24 U/L
Sodium: 142 mmol/L (ref 136–145)
Total Protein: 6.5 g/dL (ref 6.4–8.2)

## 2014-08-14 LAB — CBC WITH DIFFERENTIAL/PLATELET
BASOS ABS: 0 10*3/uL (ref 0.0–0.1)
BASOS PCT: 0.3 %
Eosinophil #: 0 10*3/uL (ref 0.0–0.7)
Eosinophil %: 0.4 %
HCT: 29.8 % — ABNORMAL LOW (ref 35.0–47.0)
HGB: 9.4 g/dL — ABNORMAL LOW (ref 12.0–16.0)
LYMPHS PCT: 17.2 %
Lymphocyte #: 2.1 10*3/uL (ref 1.0–3.6)
MCH: 25.9 pg — AB (ref 26.0–34.0)
MCHC: 31.6 g/dL — ABNORMAL LOW (ref 32.0–36.0)
MCV: 82 fL (ref 80–100)
Monocyte #: 0.9 x10 3/mm (ref 0.2–0.9)
Monocyte %: 7.8 %
Neutrophil #: 9 10*3/uL — ABNORMAL HIGH (ref 1.4–6.5)
Neutrophil %: 74.3 %
PLATELETS: 160 10*3/uL (ref 150–440)
RBC: 3.64 10*6/uL — AB (ref 3.80–5.20)
RDW: 15.4 % — ABNORMAL HIGH (ref 11.5–14.5)
WBC: 12.1 10*3/uL — ABNORMAL HIGH (ref 3.6–11.0)

## 2014-08-15 LAB — CBC WITH DIFFERENTIAL/PLATELET
Basophil #: 0 10*3/uL (ref 0.0–0.1)
Basophil %: 0.2 %
EOS ABS: 0.2 10*3/uL (ref 0.0–0.7)
EOS PCT: 1.8 %
HCT: 33.8 % — ABNORMAL LOW (ref 35.0–47.0)
HGB: 10.7 g/dL — AB (ref 12.0–16.0)
LYMPHS PCT: 16.4 %
Lymphocyte #: 1.8 10*3/uL (ref 1.0–3.6)
MCH: 26 pg (ref 26.0–34.0)
MCHC: 31.8 g/dL — AB (ref 32.0–36.0)
MCV: 82 fL (ref 80–100)
Monocyte #: 0.8 x10 3/mm (ref 0.2–0.9)
Monocyte %: 7.7 %
Neutrophil #: 8 10*3/uL — ABNORMAL HIGH (ref 1.4–6.5)
Neutrophil %: 73.9 %
Platelet: 191 10*3/uL (ref 150–440)
RBC: 4.14 10*6/uL (ref 3.80–5.20)
RDW: 15.7 % — AB (ref 11.5–14.5)
WBC: 10.8 10*3/uL (ref 3.6–11.0)

## 2014-08-16 LAB — COMPREHENSIVE METABOLIC PANEL
AST: 16 U/L (ref 15–37)
Albumin: 2.7 g/dL — ABNORMAL LOW (ref 3.4–5.0)
Alkaline Phosphatase: 86 U/L
Anion Gap: 10 (ref 7–16)
BUN: 4 mg/dL — ABNORMAL LOW (ref 7–18)
Bilirubin,Total: 0.3 mg/dL (ref 0.2–1.0)
CALCIUM: 7.9 mg/dL — AB (ref 8.5–10.1)
CHLORIDE: 103 mmol/L (ref 98–107)
CREATININE: 0.52 mg/dL — AB (ref 0.60–1.30)
Co2: 29 mmol/L (ref 21–32)
Glucose: 72 mg/dL (ref 65–99)
Osmolality: 279 (ref 275–301)
Potassium: 3 mmol/L — ABNORMAL LOW (ref 3.5–5.1)
SGPT (ALT): 17 U/L
Sodium: 142 mmol/L (ref 136–145)
Total Protein: 6.8 g/dL (ref 6.4–8.2)

## 2014-08-16 LAB — OCCULT BLOOD X 1 CARD TO LAB, STOOL: Occult Blood, Feces: POSITIVE

## 2014-08-16 LAB — CLOSTRIDIUM DIFFICILE(ARMC)

## 2014-08-17 LAB — CBC WITH DIFFERENTIAL/PLATELET
BASOS ABS: 0 10*3/uL (ref 0.0–0.1)
Basophil %: 0.6 %
Eosinophil #: 0.2 10*3/uL (ref 0.0–0.7)
Eosinophil %: 2 %
HCT: 32 % — ABNORMAL LOW (ref 35.0–47.0)
HGB: 10.2 g/dL — ABNORMAL LOW (ref 12.0–16.0)
LYMPHS PCT: 21.7 %
Lymphocyte #: 1.7 10*3/uL (ref 1.0–3.6)
MCH: 26.1 pg (ref 26.0–34.0)
MCHC: 31.8 g/dL — ABNORMAL LOW (ref 32.0–36.0)
MCV: 82 fL (ref 80–100)
MONO ABS: 1 x10 3/mm — AB (ref 0.2–0.9)
Monocyte %: 12.2 %
NEUTROS ABS: 5.1 10*3/uL (ref 1.4–6.5)
NEUTROS PCT: 63.5 %
Platelet: 215 10*3/uL (ref 150–440)
RBC: 3.9 10*6/uL (ref 3.80–5.20)
RDW: 15.2 % — AB (ref 11.5–14.5)
WBC: 8.1 10*3/uL (ref 3.6–11.0)

## 2014-08-17 LAB — BASIC METABOLIC PANEL
Anion Gap: 9 (ref 7–16)
BUN: 4 mg/dL — AB (ref 7–18)
Calcium, Total: 7.9 mg/dL — ABNORMAL LOW (ref 8.5–10.1)
Chloride: 103 mmol/L (ref 98–107)
Co2: 29 mmol/L (ref 21–32)
Creatinine: 0.51 mg/dL — ABNORMAL LOW (ref 0.60–1.30)
EGFR (African American): >60
EGFR (Non-African Amer.): >60
Glucose: 105 mg/dL — ABNORMAL HIGH (ref 65–99)
Osmolality: 279 (ref 275–301)
POTASSIUM: 3.1 mmol/L — AB (ref 3.5–5.1)
SODIUM: 141 mmol/L (ref 136–145)

## 2014-08-18 LAB — CBC WITH DIFFERENTIAL/PLATELET
Basophil #: 0.1 10*3/uL (ref 0.0–0.1)
Basophil %: 0.9 %
Eosinophil #: 0.2 10*3/uL (ref 0.0–0.7)
Eosinophil %: 3.3 %
HCT: 31.2 % — AB (ref 35.0–47.0)
HGB: 9.8 g/dL — AB (ref 12.0–16.0)
LYMPHS PCT: 25.3 %
Lymphocyte #: 1.8 10*3/uL (ref 1.0–3.6)
MCH: 25.7 pg — AB (ref 26.0–34.0)
MCHC: 31.3 g/dL — AB (ref 32.0–36.0)
MCV: 82 fL (ref 80–100)
MONOS PCT: 11.5 %
Monocyte #: 0.8 x10 3/mm (ref 0.2–0.9)
NEUTROS ABS: 4.3 10*3/uL (ref 1.4–6.5)
Neutrophil %: 59 %
Platelet: 213 10*3/uL (ref 150–440)
RBC: 3.79 10*6/uL — ABNORMAL LOW (ref 3.80–5.20)
RDW: 15.4 % — ABNORMAL HIGH (ref 11.5–14.5)
WBC: 7.3 10*3/uL (ref 3.6–11.0)

## 2014-09-01 ENCOUNTER — Ambulatory Visit: Payer: Self-pay | Admitting: Surgery

## 2014-09-01 LAB — BASIC METABOLIC PANEL
Anion Gap: 11 (ref 7–16)
BUN: 10 mg/dL (ref 7–18)
CALCIUM: 8.3 mg/dL — AB (ref 8.5–10.1)
Chloride: 111 mmol/L — ABNORMAL HIGH (ref 98–107)
Co2: 18 mmol/L — ABNORMAL LOW (ref 21–32)
Creatinine: 0.66 mg/dL (ref 0.60–1.30)
EGFR (Non-African Amer.): >60
Glucose: 78 mg/dL (ref 65–99)
Osmolality: 277 (ref 275–301)
Potassium: 4.3 mmol/L (ref 3.5–5.1)
Sodium: 140 mmol/L (ref 136–145)

## 2014-09-01 LAB — CBC WITH DIFFERENTIAL/PLATELET
Basophil #: 0.1 10*3/uL (ref 0.0–0.1)
Basophil %: 0.9 %
EOS ABS: 0.1 10*3/uL (ref 0.0–0.7)
EOS PCT: 0.8 %
HCT: 39.8 % (ref 35.0–47.0)
HGB: 12.3 g/dL (ref 12.0–16.0)
Lymphocyte #: 2.7 10*3/uL (ref 1.0–3.6)
Lymphocyte %: 41.6 %
MCH: 26.4 pg (ref 26.0–34.0)
MCHC: 30.8 g/dL — ABNORMAL LOW (ref 32.0–36.0)
MCV: 86 fL (ref 80–100)
MONO ABS: 0.5 x10 3/mm (ref 0.2–0.9)
Monocyte %: 6.9 %
NEUTROS ABS: 3.3 10*3/uL (ref 1.4–6.5)
NEUTROS PCT: 49.8 %
Platelet: 264 10*3/uL (ref 150–440)
RBC: 4.66 10*6/uL (ref 3.80–5.20)
RDW: 16.7 % — ABNORMAL HIGH (ref 11.5–14.5)
WBC: 6.6 10*3/uL (ref 3.6–11.0)

## 2014-09-03 ENCOUNTER — Ambulatory Visit: Payer: Self-pay | Admitting: Surgery

## 2014-09-08 ENCOUNTER — Encounter: Payer: Self-pay | Admitting: Neurology

## 2014-09-14 ENCOUNTER — Encounter: Payer: Self-pay | Admitting: Neurology

## 2014-09-22 NOTE — Progress Notes (Signed)
Quick Note:  Called and shared phenobarbital level with patient per Ms Martin's message, she verbalized understanding ______

## 2014-09-28 ENCOUNTER — Other Ambulatory Visit: Payer: Self-pay | Admitting: *Deleted

## 2014-09-28 MED ORDER — CITALOPRAM HYDROBROMIDE 20 MG PO TABS: ORAL_TABLET | ORAL | Status: DC

## 2014-09-29 ENCOUNTER — Other Ambulatory Visit: Payer: Self-pay | Admitting: *Deleted

## 2014-09-29 ENCOUNTER — Other Ambulatory Visit (HOSPITAL_BASED_OUTPATIENT_CLINIC_OR_DEPARTMENT_OTHER): Payer: Self-pay | Admitting: Family Medicine

## 2014-09-29 ENCOUNTER — Telehealth: Payer: Self-pay | Admitting: *Deleted

## 2014-09-29 MED ORDER — GABAPENTIN 300 MG PO CAPS: ORAL_CAPSULE | ORAL | Status: DC

## 2014-09-29 NOTE — Telephone Encounter (Signed)
Pharmacy aware of new Rx sent in for generic. Derl Barrow, RN

## 2014-09-29 NOTE — Telephone Encounter (Signed)
The generic is fine. I will resend it

## 2014-09-29 NOTE — Telephone Encounter (Signed)
[facility] called stating they received the Rx for gabapentin (NEURONTIN) 300 MG capsule.  On the Rx it stated dispense brand name which is medically necessary for condition.  However, they have been dispensing the generic name to patient.  Please advise.  Derl Barrow, RN

## 2015-01-13 ENCOUNTER — Ambulatory Visit
Admission: RE | Admit: 2015-01-13 | Discharge: 2015-01-13 | Disposition: A | Discharge status: Home / Self Care | Payer: Medicare Other | Source: Ambulatory Visit | Attending: Family Medicine | Admitting: Family Medicine

## 2015-01-13 ENCOUNTER — Encounter: Payer: Self-pay | Admitting: Family Medicine

## 2015-01-13 ENCOUNTER — Ambulatory Visit (INDEPENDENT_AMBULATORY_CARE_PROVIDER_SITE_OTHER): Payer: Medicare Other | Admitting: Family Medicine

## 2015-01-13 VITALS — BP 127/93 | HR 70 | Temp 97.8°F | Ht 66.0 in | Wt 203.4 lb

## 2015-01-13 DIAGNOSIS — M79602 Pain in left arm: Secondary | ICD-10-CM

## 2015-01-13 DIAGNOSIS — M79601 Pain in right arm: Secondary | ICD-10-CM

## 2015-01-13 HISTORY — DX: Pain in left arm: M79.602

## 2015-01-13 HISTORY — DX: Pain in right arm: M79.601

## 2015-01-13 MED ORDER — TRAMADOL HCL 50 MG PO TABS: 50.0000 mg | ORAL_TABLET | Freq: Three times a day (TID) | ORAL | Status: DC | PRN

## 2015-01-13 NOTE — Progress Notes (Signed)
Patient ID: [name], female   DOB: 12/19/1950, 64 y.o.   MRN: [id number]   HPI  Patient presents today for Bl arm pain  Patient states that she has had bilateral but right greater than left arm pain over the last 3-4 weeks. She states over the last 3 weeks it has limited her ability to lift over her head or reach overhead. She states that the pain starts in her right second finger it is a sharp shooting pain that goes up from her finger to her neck. She also describes similar pain that started in all 5 fingers of her left hand.  She's tried Tylenol, ibuprofen, Aspercreme, and ice and heat-all which do not help.  Touch seems to make the pain worse. She denies fever, chills, sweats, dyspnea, chest pain  ROS: Per HPI  Objective: BP 127/93 mmHg  Pulse 70  Temp(Src) 97.8 F (36.6 C) (Oral)  Ht 5\' 6"  (1.676 m)  Wt 203 lb 6.4 oz (92.262 kg)  BMI 32.85 kg/m2 Gen: NAD, alert, of note patient jumos of the slightest touch on her BL hands, wrists, each finger, elbows, shoulders, neck and back, did not wince with stethescope.  HEENT: NCAT CV: RRR, good S1/S2, no murmur Resp: CTABL, no wheezes, non-labored Ext: No appreciable edema of BL arms or legs Neuro: Alert and oriented, No gross deficits  Assessment and plan:  Bilateral arm pain Bl arm pain R>L, Diffuse tendernes sto palpation all over her body With Hx of conversion diosoreder I will be cautious with aggressive treatments Cannot change gabapentin, used for seizure d/o Cannot use NSAIDs - age and allergies suspicious for anaphylaxis after discussion Will not give narcotics with age and non specific etiology  Will try tramadol, caution discussed with serotonin syndrome Vague presentation, with symmetric weakness I am not concerned for significant radiculopathy, eval with plain film    Orders Placed This Encounter  Procedures  . DG Cervical Spine With Flex & Extend    Standing Status: Future     Number of Occurrences:    Standing Expiration Date: 03/14/2016    Order Specific Question:  Reason for Exam (SYMPTOM  OR DIAGNOSIS REQUIRED)    Answer:  neckl pain    Order Specific Question:  Preferred imaging location?    Answer:  [id number]    Meds ordered this encounter  Medications  . traMADol (ULTRAM) 50 MG tablet    Sig: Take 1 tablet (50 mg total) by mouth every 8 (eight) hours as needed.    Dispense:  20 tablet    Refill:  0

## 2015-01-13 NOTE — Patient Instructions (Signed)
Get you x ray as soon as you can.   Try capsaicin cream for your pain, use it 3 times daily for at least 1 week straight.   Take 2 tylenol every 8 hours for the next 5 days, i think if you are consistent it will help.   Come back to see Dr. Sherril Cong in 1-2 weeks.

## 2015-01-13 NOTE — Assessment & Plan Note (Signed)
Bl arm pain R>L, Diffuse tendernes sto palpation all over her body With Hx of conversion diosoreder I will be cautious with aggressive treatments Cannot change gabapentin, used for seizure d/o Cannot use NSAIDs - age and allergies suspicious for anaphylaxis after discussion Will not give narcotics with age and non specific etiology  Will try tramadol, caution discussed with serotonin syndrome Vague presentation, with symmetric weakness I am not concerned for significant radiculopathy, eval with plain film

## 2015-01-18 ENCOUNTER — Encounter: Payer: Self-pay | Admitting: Family Medicine

## 2015-01-18 ENCOUNTER — Other Ambulatory Visit: Payer: Self-pay | Admitting: *Deleted

## 2015-01-18 DIAGNOSIS — K219 Gastro-esophageal reflux disease without esophagitis: Secondary | ICD-10-CM

## 2015-01-19 MED ORDER — OMEPRAZOLE 20 MG PO CPDR: 20.0000 mg | DELAYED_RELEASE_CAPSULE | Freq: Every day | ORAL | Status: DC

## 2015-01-20 ENCOUNTER — Telehealth: Payer: Self-pay | Admitting: Family Medicine

## 2015-01-20 ENCOUNTER — Other Ambulatory Visit: Payer: Self-pay

## 2015-01-20 DIAGNOSIS — M79602 Pain in left arm: Secondary | ICD-10-CM

## 2015-01-20 DIAGNOSIS — M5010 Cervical disc disorder with radiculopathy, unspecified cervical region: Secondary | ICD-10-CM

## 2015-01-20 DIAGNOSIS — M79601 Pain in right arm: Secondary | ICD-10-CM

## 2015-01-20 MED ORDER — PHENOBARBITAL 100 MG PO TABS: 100.0000 mg | ORAL_TABLET | Freq: Every day | ORAL | Status: DC

## 2015-01-20 NOTE — Telephone Encounter (Signed)
Looks like you saw her and ordered the xray. Do you mind letting her know?  Thanks

## 2015-01-20 NOTE — Telephone Encounter (Signed)
Rx signed and faxed.

## 2015-01-20 NOTE — Telephone Encounter (Signed)
Want results from Xray

## 2015-01-21 DIAGNOSIS — M5010 Cervical disc disorder with radiculopathy, unspecified cervical region: Secondary | ICD-10-CM | POA: Insufficient documentation

## 2015-01-21 NOTE — Telephone Encounter (Signed)
Pt informed of both appts. Schon Zeiders Kennon Holter, CMA

## 2015-01-21 NOTE — Addendum Note (Signed)
Addended by: Timmothy Euler on: 01/21/2015 03:04 PM   Modules accepted: Orders

## 2015-01-21 NOTE — Telephone Encounter (Signed)
Pt scheduled for MRI on Monday April 18th @ 11, must arrive @ 10:45. They also stated that she need a creatinine before she has the appt can you order that? Deseree Kennon Holter, CMA

## 2015-01-21 NOTE — Telephone Encounter (Signed)
Discussed with patient who is still in pain and notes more limitations in her function. On my exam she had BL UE weakness 3-4/5 BL and symmetric with neck pain radiating to her R hand in a radicular pattern.   Despite my impression before, given her DDD adn continued pain, functional limitations,and weakness I will go ahead and pursue MRI.   Will ask nursing to hlep me arrange it.   Patient is aware and is expecting Nursing's call when it is arranged.   Laroy Apple, MD [place] Resident, PGY-3 01/21/2015, 1:56 PM

## 2015-01-21 NOTE — Telephone Encounter (Signed)
cmp for creatinine ordered, i also checked to be sure the image was without contrast - Which it is.   Will ask nursing to arrange.   Laroy Apple, MD [place] Resident, PGY-3 01/21/2015, 3:02 PM

## 2015-01-24 ENCOUNTER — Other Ambulatory Visit: Payer: Medicare Other

## 2015-01-24 DIAGNOSIS — M79602 Pain in left arm: Secondary | ICD-10-CM

## 2015-01-24 DIAGNOSIS — M5010 Cervical disc disorder with radiculopathy, unspecified cervical region: Secondary | ICD-10-CM

## 2015-01-24 DIAGNOSIS — M79601 Pain in right arm: Secondary | ICD-10-CM

## 2015-01-24 LAB — COMPREHENSIVE METABOLIC PANEL
ALT: 15 U/L (ref 0–35)
AST: 19 U/L (ref 0–37)
Albumin: 3.7 g/dL (ref 3.5–5.2)
Alkaline Phosphatase: 103 U/L (ref 39–117)
BILIRUBIN TOTAL: 0.2 mg/dL (ref 0.2–1.2)
BUN: 16 mg/dL (ref 6–23)
CALCIUM: 8.7 mg/dL (ref 8.4–10.5)
CO2: 24 mEq/L (ref 19–32)
Chloride: 103 mEq/L (ref 96–112)
Creat: 0.63 mg/dL (ref 0.50–1.10)
Glucose, Bld: 82 mg/dL (ref 70–99)
Potassium: 4.6 mEq/L (ref 3.5–5.3)
SODIUM: 137 meq/L (ref 135–145)
TOTAL PROTEIN: 7.6 g/dL (ref 6.0–8.3)

## 2015-01-24 NOTE — Progress Notes (Signed)
I was preceptor the day of this visit.   

## 2015-01-24 NOTE — Progress Notes (Signed)
[facility] phlebotomist drew:  CMET

## 2015-01-31 ENCOUNTER — Other Ambulatory Visit: Payer: Self-pay | Admitting: *Deleted

## 2015-01-31 DIAGNOSIS — F32A Depression, unspecified: Secondary | ICD-10-CM

## 2015-01-31 DIAGNOSIS — L309 Dermatitis, unspecified: Secondary | ICD-10-CM

## 2015-01-31 DIAGNOSIS — F329 Major depressive disorder, single episode, unspecified: Secondary | ICD-10-CM

## 2015-01-31 MED ORDER — CITALOPRAM HYDROBROMIDE 20 MG PO TABS: ORAL_TABLET | ORAL | Status: DC

## 2015-01-31 MED ORDER — TRIAMCINOLONE ACETONIDE 0.5 % EX OINT: TOPICAL_OINTMENT | CUTANEOUS | Status: DC

## 2015-02-07 ENCOUNTER — Ambulatory Visit (HOSPITAL_COMMUNITY)
Admission: RE | Admit: 2015-02-07 | Discharge: 2015-02-07 | Disposition: A | Discharge status: Home / Self Care | Payer: Medicare Other | Source: Ambulatory Visit | Attending: Family Medicine | Admitting: Family Medicine

## 2015-02-07 DIAGNOSIS — M79601 Pain in right arm: Secondary | ICD-10-CM

## 2015-02-07 DIAGNOSIS — M5030 Other cervical disc degeneration, unspecified cervical region: Secondary | ICD-10-CM | POA: Insufficient documentation

## 2015-02-07 DIAGNOSIS — M4802 Spinal stenosis, cervical region: Secondary | ICD-10-CM | POA: Insufficient documentation

## 2015-02-07 DIAGNOSIS — M79602 Pain in left arm: Secondary | ICD-10-CM

## 2015-02-07 DIAGNOSIS — M5010 Cervical disc disorder with radiculopathy, unspecified cervical region: Secondary | ICD-10-CM

## 2015-02-11 ENCOUNTER — Ambulatory Visit (INDEPENDENT_AMBULATORY_CARE_PROVIDER_SITE_OTHER): Payer: Medicare Other | Admitting: Family Medicine

## 2015-02-11 ENCOUNTER — Telehealth: Payer: Self-pay | Admitting: Family Medicine

## 2015-02-11 ENCOUNTER — Encounter: Payer: Self-pay | Admitting: Family Medicine

## 2015-02-11 DIAGNOSIS — M79602 Pain in left arm: Secondary | ICD-10-CM | POA: Diagnosis not present

## 2015-02-11 DIAGNOSIS — M79601 Pain in right arm: Secondary | ICD-10-CM

## 2015-02-11 LAB — CBC WITH DIFFERENTIAL/PLATELET
BASOS ABS: 0 10*3/uL (ref 0.0–0.1)
Basophils Relative: 0 % (ref 0–1)
Eosinophils Absolute: 0.2 10*3/uL (ref 0.0–0.7)
Eosinophils Relative: 2 % (ref 0–5)
HEMATOCRIT: 32.3 % — AB (ref 36.0–46.0)
Hemoglobin: 10.6 g/dL — ABNORMAL LOW (ref 12.0–15.0)
Lymphocytes Relative: 22 % (ref 12–46)
Lymphs Abs: 2.1 10*3/uL (ref 0.7–4.0)
MCH: 26.8 pg (ref 26.0–34.0)
MCHC: 32.8 g/dL (ref 30.0–36.0)
MCV: 81.6 fL (ref 78.0–100.0)
MPV: 10.3 fL (ref 8.6–12.4)
Monocytes Absolute: 0.8 10*3/uL (ref 0.1–1.0)
Monocytes Relative: 8 % (ref 3–12)
NEUTROS ABS: 6.4 10*3/uL (ref 1.7–7.7)
Neutrophils Relative %: 68 % (ref 43–77)
Platelets: 271 10*3/uL (ref 150–400)
RBC: 3.96 MIL/uL (ref 3.87–5.11)
RDW: 14.8 % (ref 11.5–15.5)
WBC: 9.4 10*3/uL (ref 4.0–10.5)

## 2015-02-11 LAB — POCT SEDIMENTATION RATE: POCT SED RATE: 58 mm/hr — AB (ref 0–22)

## 2015-02-11 NOTE — Patient Instructions (Signed)
Rheumatoid Arthritis °Rheumatoid arthritis is a long-term (chronic) inflammatory disease that causes pain, swelling, and stiffness of the joints. It can affect the entire body, including the eyes and lungs. The effects of rheumatoid arthritis vary widely among those with the condition. °CAUSES  °The cause of rheumatoid arthritis is not known. It tends to run in families and is more common in women. Certain cells of the body's natural defense system (immune system) do not work properly and begin to attack healthy joints. It primarily involves the connective tissue that lines the joints (synovial membrane). This can cause damage to the joint. °SYMPTOMS  °· Pain, stiffness, swelling, and decreased motion of many joints, especially in the hands and feet. °· Stiffness that is worse in the morning. It may last 1-2 hours or longer. °· Numbness and tingling in the hands. °· Fatigue. °· Loss of appetite. °· Weight loss. °· Low-grade fever. °· Dry eyes and mouth. °· Firm lumps (rheumatoid nodules) that grow beneath the skin in areas such as the elbows and hands. °DIAGNOSIS  °Diagnosis is based on the symptoms described, an exam, and blood tests. Sometimes, X-rays are helpful. °TREATMENT  °The goals of treatment are to relieve pain, reduce inflammation, and to slow down or stop joint damage and disability. Methods vary and may include: °· Maintaining a balance of rest, exercise, and proper nutrition. °· Medicines: °¨ Pain relievers (analgesics). °¨ Corticosteroids and nonsteroidal anti-inflammatory drugs (NSAIDs) to reduce inflammation. °¨ Disease-modifying antirheumatic drugs (DMARDs) to try to slow the course of the disease. °¨ Biologic response modifiers to reduce inflammation and damage. °· Physical therapy and occupational therapy. °· Surgery for patients with severe joint damage. Joint replacement or fusing of joints may be needed. °· Routine monitoring and ongoing care, such as office visits, blood and urine tests, and  X-rays. °HOME CARE INSTRUCTIONS  °· Remain physically active and reduce activity when the disease gets worse. °· Eat a well-balanced diet. °· Put heat on affected joints when you wake up and before activities. Keep the heat on the affected joint for as long as directed by your health care provider. °· Put ice on affected joints following activities or exercising. °¨ Put ice in a plastic bag. °¨ Place a towel between your skin and the bag. °¨ Leave the ice on for 15-20 minutes, 3-4 times per day, or as directed by your health care provider. °· Take medicines and supplements only as directed by your health care provider. °· Use splints as directed by your health care provider. Splints help maintain joint position and function. °· Do not sleep with pillows under your knees. This may lead to spasms. °· Participate in a self-management program to keep current with the latest treatment and coping skills. °SEEK IMMEDIATE MEDICAL CARE IF: °· You have fainting episodes. °· You have periods of extreme weakness. °· You rapidly develop a hot, painful joint that is more severe than usual joint aches. °· You have chills. °· You have a fever. °FOR MORE INFORMATION  °· [employer]: www.rheumatology.org °· [employer]: www.arthritis.org °Document Released: 10/05/2000 Document Revised: 02/22/2014 Document Reviewed: 11/14/2011 °ExitCare® Patient Information ©2015 [employer]. This information is not intended to replace advice given to you by your health care provider. Make sure you discuss any questions you have with your health care provider. ° °

## 2015-02-11 NOTE — Telephone Encounter (Signed)
Called to discuss MRI findings.   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 Discussed Neurosurgery and she states that she has seen one today and that they have referred her to rheumatology.   She does not have any questions.   Laroy Apple, MD [place] Resident, PGY-3 02/11/2015, 12:20 PM

## 2015-02-11 NOTE — Progress Notes (Signed)
   Subjective:    Patient ID: [name], female    DOB: [birth year], 64 y.o.   MRN: [id number]  HPI Pt presents for f/u of bilateral arm pain. 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   Pt reports symptoms have been present for >1 month and are progressively worsening. She has also noticed pain and swelling of her elbows, knees and finger joints.   On chart review she had similar issues in 2013 and had elevated ESR and RF at that time so was referred to rheumatology. Patient denies being followed by rheumatology and does not recall what came of the referral in 2013.   Review of Systems No fevers but dose endorse chills and cold intolerance    Objective:   Physical Exam  Constitutional: She is oriented to person, place, and time. She appears well-developed and well-nourished. No distress.  HENT:  Head: Normocephalic and atraumatic.  Eyes: Conjunctivae are normal. Right eye exhibits no discharge. Left eye exhibits no discharge.  Neck: Normal range of motion. Neck supple.  Cardiovascular: Normal rate.   Pulmonary/Chest: Effort normal. No respiratory distress.  Abdominal: She exhibits no distension.  Musculoskeletal: She exhibits edema and tenderness.  Exquisitely tender over arms, knees and fingers, seems worse over joints but exam limited by patient discomfort.   Neurological: She is alert and oriented to person, place, and time.  Skin: Skin is warm and dry. No rash noted. She is not diaphoretic.  Psychiatric: She has a normal mood and affect. Her behavior is normal.  Nursing note and vitals reviewed.         Assessment & Plan:

## 2015-02-11 NOTE — Telephone Encounter (Signed)
-----   Message from Frazier Richards, MD sent at 02/09/2015  4:46 PM EDT ----- Thanks for taking care of this, good buddy!  ----- Message -----    From: Rad Results In Interface    Sent: 02/07/2015   2:27 PM      To: Frazier Richards, MD

## 2015-02-11 NOTE — Assessment & Plan Note (Addendum)
Exquisitely tender over multiple joints with reported swelling. H/o of elevated RF in 2013 with rheum referral at that time but not followed by rheum, does not know what happened when she saw them. Concerned for RA with active flair. - will check RF, anti-CCP, ESR, CRP and CBC today - urgent referral to rheumatology - continue tramadol prn pain - will not do steroid burst so as not to confound rheum work-up.

## 2015-02-12 LAB — C-REACTIVE PROTEIN: CRP: 4.1 mg/dL — AB (ref <0.60)

## 2015-02-12 LAB — RHEUMATOID FACTOR: Rhuematoid fact SerPl-aCnc: 171 IU/mL — ABNORMAL HIGH (ref <=14)

## 2015-02-12 NOTE — H&P (Signed)
History of Present Illness 64 yof who began having severe chest, epigastric, and back pain, associated with nausea and vomiting, yesterday. No hematemesis, fever, or melena. Had 3 loose BMs yesterday. Hasn't eaten today.   Past Med/Surgical Hx:  Seizures:   Hypertension:   Back Surgery:   Knee Surgery - Right:   Knee Surgery - Left:   Cholecystectomy:   Appendectomy:   ALLERGIES:  Penicillin: Rash  Ibuprofen: N/V/Diarrhea  Codeine: N/V/Diarrhea  tetanus/diphth/pertuss (Tdap) adult/adol: Swelling  Aspirin: N/V/Diarrhea  HOME MEDICATIONS: Medication Instructions Status  carvedilol 3.125 mg oral tablet 1 tab(s) orally 2 times a day with meal Active  Topamax 50 mg oral tablet 1 tab(s) orally 2 times a day Active  PriLOSEC 20 mg oral delayed release capsule 1 cap(s) orally once a day Active  citalopram 20 mg oral tablet 1 tab(s) orally once a day Active  Combivent Respimat CFC free 100 mcg-20 mcg/inh inhalation aerosol 1 puff(s) inhaled every 6 hours Active  gabapentin 300 mg oral capsule 3 cap(s) orally 2 times a day Active  PHENobarbital 100 mg oral tablet 1 tab(s) orally once a day Active  nystatin topical 100000 units/g topical powder Apply topically to to breast 3 times a day  Active   Family and Social History:  Family History Non-Contributory   Social History negative tobacco, negative ETOH, married, daughter lives nearby   Place of Living Home   Review of Systems:  Fever/Chills No   Cough No   Sputum No   Abdominal Pain Yes   Diarrhea Yes   Constipation No   Nausea/Vomiting Yes   SOB/DOE No   Chest Pain Yes   Dysuria No   Tolerating PT Yes   Tolerating Diet No  Nauseated  Vomiting   Medications/Allergies Reviewed Medications/Allergies reviewed   Physical Exam:  GEN well developed, well nourished, no acute distress   HEENT pink conjunctivae, PERRL, hearing intact to voice, moist oral mucosa, Oropharynx clear   NECK supple  trachea midline    RESP normal resp effort  no use of accessory muscles   CARD regular rate  no murmur  no JVD  no Rub   ABD positive tenderness  nondistended; equisite tenderness with touching the skin, but no real tenderness with abdominal wall pressure, particuarly when distracted, but peritoneal signs are difficult to rule out   LYMPH negative neck   EXTR negative cyanosis/clubbing, negative edema   SKIN normal to palpation, skin turgor poor   NEURO cranial nerves intact, negative tremor, follows commands, motor/sensory function intact   PSYCH alert, A+O to time, place, person, poor insight   Lab Results: Routine Chem:  22-Oct-15 17:07   Glucose, Serum  109  BUN 12  Creatinine (comp) 0.71  Sodium, Serum 140  Potassium, Serum 4.1  Chloride, Serum  108  CO2, Serum 21  Calcium (Total), Serum  8.3  Anion Gap 11  Osmolality (calc) 280  eGFR (African American) >60  eGFR (Non-African American) >60 (eGFR values <39m/min/1.73 m2 may be an indication of chronic kidney disease (CKD). Calculated eGFR, using the MRDR Study equation, is useful in  patients with stable renal function. The eGFR calculation will not be reliable in acutely ill patients when serum creatinine is changing rapidly. It is not useful in patients on dialysis. The eGFR calculation may not be applicable to patients at the low and high extremes of body sizes, pregnant women, and vetetarians.)  Cardiac:  22-Oct-15 17:07   CK, Total 44 (26-192 NOTE: NEW REFERENCE RANGE  11/23/2013)  Troponin I < 0.02 (0.00-0.05 0.05 ng/mL or less: NEGATIVE  Repeat testing in 3-6 hrs  if clinically indicated. >0.05 ng/mL: POTENTIAL  MYOCARDIAL INJURY. Repeat  testing in 3-6 hrs if  clinically indicated. NOTE: An increase or decrease  of 30% or more on serial  testing suggests a  clinically important change)  Routine Hem:  22-Oct-15 17:07   WBC (CBC)  22.9  RBC (CBC) 4.89  Hemoglobin (CBC) 12.5  Hematocrit (CBC) 41.4  Platelet Count  (CBC) 158 (Result(s) reported on 12 Aug 2014 at 05:47PM.)  MCV 85  MCH  25.6  MCHC  30.2  RDW  16.2   Radiology Results: LabUnknown:    22-Oct-15 19:09, CT Angiography Chest/Abd W/WO Combo  PACS Image  CT:  CT Angiography Chest/Abd W/WO Combo  REASON FOR EXAM:    sudden sharp chest pain rad to back and abdomen last   night, constant to now. hur  COMMENTS:       PROCEDURE: CT  - CT ANGIOGRAPHY CHEST/ABD W/WO  - Aug 12 2014  7:09PM     CLINICAL DATA:  64 year old female with acute onset chest and  abdominal pain radiating to the back with shortness of breath after  raking leaves. Initial encounter.    EXAM:  CT ANGIOGRAPHY CHEST AND ABDOMEN    TECHNIQUE:  Multidetector CT imaging of the chest and abdomen was performed  using the standardprotocol during bolus administration of  intravenous contrast. Multiplanar CT image reconstructions and MIPs  were obtained to evaluate the vascular anatomy.    CONTRAST:  125 mm Isovue 370.    COMPARISON:  Chest CTA 07/13/2013.    FINDINGS:  CTA CHEST FINDINGS    Similar lung volumes. Major airways are patent. No pneumothorax or  pleural effusion. Patchy bilateral lower lobe and lingula opacity,  some areas demonstrated early air bronchograms (series 3, image 31).  No pericardial effusion. Negative thoracic inlet. No mediastinal  lymphadenopathy. No axillary lymphadenopathy.    No acute osseous abnormality identified.    VASCULAR FINDINGS:    Soft and calcified plaque at the left subclavian artery origin,  great vessel origins are patent. Nothoracic aortic dissection. Mild  thoracic aortic dolichoectasia stable. Soft and calcified plaque in  the descending aorta. Calcified coronary artery plaque.    Central pulmonary arteries also are patent.    Review of the MIP images confirms the above findings.  CTA ABDOMEN FINDINGS    Degenerative changes in the lumbar spine. No acute osseous  abnormality identified. Diverticulosis of the  left colon, no active  inflammation identified. Occasional diverticula in the transverse  and visible rightcolon. A portion of the appendix probably is  visualized on series 6, image 206 and appears normal. No dilated  small bowel loops.    Surgically absent gallbladder. Abnormal intermediate density and  inflammatory stranding surrounding the gastric antrum and duodenum  bulb (series 6, images 126 through 137). No new pneumoperitoneum,  although there does appear to be a small volume of extra luminal  fluid. The second portion and distal duodenum than appear normal.  The gastric fundus appears normal. The inflammation mildly affects  the porta hepatis, but there is no biliary tree enlargement  identified. Mild hepatic steatosis.    Spleen, pancreas, adrenal glands, and kidneys are within normal  limits. No contrast in the portal venous system at the time of these  images.    VASCULAR FINDINGS:    Soft and calcified plaque of the abdominal aorta and  continuing into  the iliac arteries. The celiac axis, SMA, main renal arteries, and  IMA remain patent. 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  No abdominal aortic dissection or aneurysm. Visible iliac arteries  are patent.  Review of the MIP images confirms the above findings.     IMPRESSION:  1. Inflamed gastric antrum and duodenum bulb with small volume  surrounding free fluid, no definite free air. Appearance is most  suspicious for perforated gastric/duodenal ulcer disease.  2. Negative CTA chest abdomen and pelvis except for atherosclerosis  of the aorta and its major branches. 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  3. Confluent bilateral lower lobe pulmonary opacity, favor  atelectasis at this time but early pneumonia difficult to exclude.      Electronically Signed    By: Lars Pinks M.D.     On: 08/12/2014 19:35         Verified By: Gwenyth Bender. HALL, M.D.,    Assessment/Admission Diagnosis Likely PUD, possibly with minor, subclinical, perforation   Plan Admit IVF IV ABx IV PPI NG suction IM consult for IV medical management of seiure d/o, HTN, etc. GI consult in AM   Electronic Signatures: Consuela Mimes (MD)  (Signed 22-Oct-15 21:42)  Authored: CHIEF COMPLAINT and HISTORY, PAST MEDICAL/SURGIAL HISTORY, ALLERGIES, HOME MEDICATIONS, FAMILY AND SOCIAL HISTORY, REVIEW OF SYSTEMS, PHYSICAL EXAM, LABS, Radiology, ASSESSMENT AND PLAN   Last Updated: 22-Oct-15 21:42 by Consuela Mimes (MD)

## 2015-02-12 NOTE — Consult Note (Signed)
PATIENT NAME:  [name], PRESCHER MR#:  [id number] DATE OF BIRTH:  [birth year]  DATE OF CONSULTATION:  08/12/2014  REFERRING PHYSICIAN:  Consuela Mimes, MD.  PRIMARY CARE PHYSICIAN:  Nonlocal.   CONSULTING PHYSICIAN:  Aaron Mose. Peirce Deveney, MD  REASON FOR CONSULT:  Hypertension, seizure disorder, n.p.o. status.   HISTORY OF PRESENT ILLNESS: This is a 64 year old African American female who originally presented to the hospital on 08/12/2014 with chief complaint of epigastric pain. She has past medical history significant for gastroesophageal reflux disease,well controlled seizure disorder.  She once again presented with acute onset epigastric pain with radiation to her chest and back. She described this mainly as "pain," intensity 10 out of 10, worse with p.o. intake without relieving factors. Had associated nausea,  vomiting, nonbloody, nonbilious emesis. Said her last bowel movement was approximately 1 day ago. Denies any fevers, chills, further symptomatology. Evaluation in Emergency Department revealed inflamed gastric antrum and duodenal bulb with small volume of surrounding free fluid which is concerning for perforation.  She was evaluated by general surgery and admitted to their service. Asked for medical consult given her n.p.o. status. She is on multiple medications for hypertension as well as seizure disorder. Currently complaining only of epigastric/abdominal pain, intensity 6-7 out of 10.    REVIEW OF SYSTEMS:   CONSTITUTIONAL: Denies fevers, chills, fatigue, weakness.  EYES: Denies blurred vision, double vision, eye pain.  ENT: Denies tinnitus, ear pain, hearing loss.  RESPIRATORY: Denies cough, wheeze, shortness of breath.  CARDIOVASCULAR: Denies chest pain, palpitations, edema, other than chest pain as mentioned above.  GASTROINTESTINAL: Positive for nausea, vomiting as mentioned above as well as abdominal pain. Denies diarrhea.  GENITOURINARY: Denies dysuria, hematuria.  ENDOCRINE: Denies  nocturia or thyroid problems.  HEMATOLOGIC AND LYMPHATIC: Denies easy bruising, bleeding.   SKIN: Denies rash or lesion.  MUSCULOSKELETAL: Denies pain in neck, back, shoulder, knees, hips or arthritic symptoms.  NEUROLOGIC: Denies paralysis, paresthesias.  PSYCHIATRIC: Denies anxiety or depressive symptoms.   Otherwise full review of systems is negative.   PAST MEDICAL HISTORY: Essential hypertension, gastroesophageal reflux disease as well as seizure disorder, well controlled, last seizure back in 2013.   SOCIAL HISTORY: Denies any tobacco, alcohol or drug usage.   FAMILY HISTORY: Denies any known cardiovascular or pulmonary disorders.   ALLERGIES: ASPIRIN, CODEINE, IBUPROFEN, PENICILLIN, TETANUS VACCINATIONS.   HOME MEDICATIONS INCLUDE: Coreg 3.125 mg p.o. b.i.d., citalopram 20 mg p.o. daily, Combivent 100/20 mcg inhalation 1 puff q. 6 hours, gabapentin 300 mg 3 capsules p.o. b.i.d., nystatin 100,000 units/gram topical powder to breasts 3 times daily, phenobarbital 100 mg p.o. daily, Prilosec 20 mg p.o. daily, Topamax 50 mg p.o. b.i.d.   PHYSICAL EXAMINATION:  VITAL SIGNS: Temperature 98.8, heart rate 88, respirations 16, blood pressure 124/85, saturating 92% on room air. Weight is 90.8 kg, BMI of 33.3 kg.  GENERAL: Well-nourished, well-developed, African American female, currently in minimal distress given abdominal pain.  HEAD: Normocephalic, atraumatic.  EYES: Pupils equal, reactive to light. Extraocular muscles intact. No scleral icterus.  MOUTH: Dry mucosal membrane. Dentition intact. No abscess noted.  EARS, NOSE, AND THROAT: Clear without exudates. No external lesions.  NECK:  Supple, no thyromegaly or nodules, no JVD.  PULMONARY: Clear to auscultation bilaterally without wheezes, rales or rhonchi.  CHEST: Nontender to palpation.  CARDIOVASCULAR: S1, S2, regular rate and rhythm. No murmurs, rubs, or gallops. No edema. Pedal pulses 2+ bilaterally.  GASTROINTESTINAL: Tenderness  to palpation over epigastric region with voluntary guarding. No rebound, nondistended. Hypoactive  bowel sounds. No hepatosplenomegaly noted.  MUSCULOSKELETAL: No swelling, clubbing, or edema. Range of motion full in all extremities.  NEUROLOGIC: Cranial nerves II-XII intact. No gross focal neurologic deficits, sensation intact, reflexes intact.  SKIN: No ulceration, lesions, rash, cyanosis. Skin dry, turgor intact.  PSYCHIATRIC: Mood, affect within normal limits.  Alert and oriented x 3. Insight and judgment intact.   LABORATORY DATA: CT chest, abdomen and pelvis performed revealing inflamed gastric antrum and duodenal bulb with small volume of surrounding free fluid noted with air apparent, is most suspicious for perforated gastroduodenal ulcer.   Remainder of laboratory data: Sodium 140, potassium 4.1, chloride 108, bicarbonate 21, BUN 12, creatinine 0.71, glucose 109. Troponin less than 0.2. WBC 22.9, hemoglobin 12.5, platelets of 158,000.   EKG performed: Normal sinus rhythm; heart rate of 76.   ASSESSMENT AND PLAN:  [birth year].  A 64 year old African American female, history of gastroesophageal reflux disease, hypertension, seizure disorder, presenting with acute-onset epigastric pain and found to have inflammation of the gastric antrum as well as duodenal bulb with small amount of free fluid concerning for peptic ulcer with perforation. Asked for medical consult given patient's n.p.o. status and medications for seizures as well as hypertension and peptic ulcer disease with possible perforation. We will defer management to surgery at this time. The patient is n.p.o., placed on PPI therapy through the IV. Antibiotics ceftriaxone and clindamycin. Should have an NG tube inserted as well as GI evaluation.  2.  Seizure disorder. Well controlled with last seizure back in 2013. Given her n.p.o. status I discussed the case personally with pharmacy who will change her phenobarbital over to IV as well as add IV  Keppra. As she has not been taking gabapentin or Topamax, when she can tolerate p.o. she can switch back to her home medications and stop Keppra.  3.  Hypertension, essential. Given her n.p.o. status she already has an order for p.r.n. Lopressor and will continue this; however, I would ideally opt for hydralazine over Lopressor strictly for blood pressure control if there is no complaint of tachycardia.  4.  DVT prophylaxis with SCDs.   The patient is full code.   TIME SPENT: 35 minutes    ____________________________ Aaron Mose. Yitty Roads, MD dkh:lt D: 08/12/2014 23:16:27 ET T: 08/13/2014 09:00:48 ET JOB#: [id number]  cc: Aaron Mose. Billyjack Trompeter, MD, <Dictator> Trevontae Lindahl Woodfin Ganja MD ELECTRONICALLY SIGNED 08/13/2014 20:43

## 2015-02-12 NOTE — Discharge Summary (Signed)
PATIENT NAME:  KAITLYNNE, WENZ MR#:  [id number] DATE OF BIRTH:  [birth year]  DATE OF ADMISSION:  08/12/2014 DATE OF DISCHARGE:  08/19/2014  FINAL DIAGNOSES: 1.  Contained perforation, peptic ulcer.  2.  Hypertension.  3.  Obesity.  4.  Seizure disorder.   PRINCIPAL PROCEDURES:  CT scan of the abdomen and pelvis, upper gastrointestinal series.   CONSULTATIONS:  Internal medicine consultation, GI medicine consultation.   HOSPITAL COURSE SUMMARY:  The patient was admitted with what looked like a contained perforation of a peptic ulcer. She was treated with intravenous antibiotics and bowel rest. Internal medicine did become involved for management of her medical comorbidities including her seizure medications. Dr. Rayann Heman of GI medicine did see her for consideration of future EGD. The patient had near resolution of her symptoms by the day of her discharge. Her white count improved. An Upper GI series showed no perforation or stenosis but a small ulcer in distal stomach/pylorus.  She was started on Helicobacter pylori therapy. She tolerated this well. Her diet was able to be advanced from clear liquids to a soft mechanical diet, and she was discharged home in stable condition on October 29 with close followup in the office. Medication reconciliation form was performed and provided with the chart.    ____________________________ Jeannette How. Marina Gravel, MD FACS mab:nb D: 09/02/2014 20:31:33 ET T: 09/02/2014 21:07:22 ET JOB#: [id number]  cc: Elta Guadeloupe A. Marina Gravel, MD, <Dictator> Katilynn Sinkler A Donye Campanelli MD ELECTRONICALLY SIGNED 09/02/2014 22:08

## 2015-02-12 NOTE — Consult Note (Signed)
Brief Consult Note: Diagnosis: pud, possible perferation, seizure d/o, htn.   Patient was seen by consultant.   Consult note dictated.   Orders entered.   Comments: 62yAAF pmh GERD, HTN, seizure d/o - last seizure 2013, p/w epigastric pain acute onset, epigastric pain, radiation chest, back, "pain" 10/10, worse with PO, no releiving, associated n/v, last BM 1 day ago Eval in Ed reveal Inflamed gastric antrum and duodenum bulb with small volume surrounding free fluid - concern for perferation - gen surgery eval/admit pt  1. PUD, possible perferation - defer to surgery - currently NPO, PPI IV, Abx - ceftriaxone, clinda, NG tube, GI eval 2. seizure d/o: given npo status discuss case with pharmacy -- change phenobarb to IV, add IV keppra - when tolerate po can switch back to home meds 3. HTN: given NPO status, already has order for PRN lopressor - can continue with this, would opt for hydralazine over lopressor for BP control 4. vte px: scd full 60min [id number].  Electronic Signatures: Cyril Railey, Aaron Mose (MD)  (Signed 22-Oct-15 23:18)  Authored: Brief Consult Note   Last Updated: 22-Oct-15 23:18 by Lytle Butte (MD)

## 2015-02-12 NOTE — Consult Note (Signed)
PATIENT NAME:  [name], [name] MR#:  [id number] DATE OF BIRTH:  12-20-1950  DATE OF CONSULTATION:  08/13/2014  REFERRING PHYSICIAN:  Srikar R. Sudini, MD CONSULTING PHYSICIAN:  Arther Dames, MD  REASON FOR CONSULTATION: Contained perforated gastric ulcer.   HISTORY OF PRESENT ILLNESS: [name] is a 64 year old female with a past medical history notable for GERD, hypertension and seizure disorder, who presented to the Emergency Room for evaluation of severe and sudden onset of epigastric, chest and back pain.   She presented to the Emergency Room for evaluation and there she had a CT scan of her abdomen and pelvis showing a likely contained perforation in the area of the pylorus and duodenal bulb. She was admitted to the surgical service for further evaluation, started on antibiotics and placed on n.p.o. status.   Currently at this time, [name] reports ongoing, significant diffuse abdominal pain. The pain does travel to her back. She is on a PCA and feels as though she is getting some mild relief but is still having significant pain. Otherwise, she denies any rectal bleeding, melena, fevers or chills at this time. She has not had any prior trouble with ulcers that she is aware of and denies any prior upper endoscopy.   PAST MEDICAL HISTORY: 1. Hypertension.  2. GERD.  3. Seizure disorder.   ALLERGIES: ASPIRIN, CODEINE, IBUPROFEN, PENICILLIN, TETANUS VACCINATION.   HOME MEDICATIONS:  1. Coreg 3.125 mg b.i.d.  2. Citalopram 20 mg daily.  3. Combivent 100/20 at 1 puff every 6 hours  4. Gabapentin 300 mg b.i.d.  5. Nystatin topical.  6. Phenobarbital 100 mg daily.  7. Prilosec 20 mg daily.  8. Topamax 50 mg b.i.d.   SOCIAL HISTORY: She denies any tobacco or alcohol to me.   FAMILY HISTORY: No family history of GI malignancy that she is aware of.   REVIEW OF SYSTEMS: A 10-system review is conducted. It is negative except as stated in the history of present illness.   PHYSICAL  EXAMINATION: VITAL SIGNS: Currently: Temperature is 99.8, pulse is 89, respirations are 16, blood pressure 128/83. Pulse oximetry is 90% on room air.  GENERAL: Alert and oriented times 4.  No acute distress. Appears stated age. HEENT: Normocephalic/atraumatic. Extraocular movements are intact. Anicteric. NECK: Soft, supple. JVP appears normal. No adenopathy. CHEST: Clear to auscultation. No wheeze or crackle. Respirations unlabored. HEART: Regular. No murmur, rub, or gallop.  Normal S1 and S2. ABDOMEN: Significant diffuse tenderness to palpation. Nondistended. Normal active bowel sounds in all four quadrants.  No organomegaly. No masses EXTREMITIES: No swelling, well perfused. SKIN: No rash or lesion. Skin color, texture, turgor normal. NEUROLOGICAL: Grossly intact. PSYCHIATRIC: Normal tone and affect. MUSCULOSKELETAL: No joint swelling or erythema.   LABORATORY DATA: Sodium 142, potassium 3.7, BUN 12, creatinine 0.73. Her lipase is 90. Liver enzymes are normal except her albumin is 2.8. Cardiac enzymes are negative. Her white count is 15 which is down from 23. Hemoglobin is 10, hematocrit is 33, platelets are 192,000. INR is 1.3.   CT SCAN: See HPI. It does show a likely contained ulcer.   ASSESSMENT AND PLAN:  Contained perforated ulcer in the area of the pylorus and duodenal bulb: This does likely account for the sudden onset of continued severe pain along with leukocytosis. I do agree with treating with IV PPI antibiotics at this time. I also do agree with conservative management. An upper endoscopy at this time may increase the risks of causing a larger perforation in the setting  of contained perforation. Therefore, I would recommend holding off on an upper endoscopy for about six weeks. In about six weeks, it would be important to perform an upper endoscopy to rule out malignancy as a cause of this and to check for healing of this gastric ulcer. We will see her in clinic in several weeks to  see how she is doing and discuss setting up this upper endoscopy. For now, I do agree with your conservative management.   Thank you for this consult.   ____________________________ Arther Dames, MD mr:lm D: 08/13/2014 17:01:22 ET T: 08/13/2014 23:05:29 ET JOB#: [id number]  cc: Arther Dames, MD, <Dictator> Mellody Life MD ELECTRONICALLY SIGNED 09/14/2014 15:30

## 2015-02-15 LAB — CYCLIC CITRUL PEPTIDE ANTIBODY, IGG: Cyclic Citrullin Peptide Ab: 51.7 U/mL — ABNORMAL HIGH (ref 0.0–5.0)

## 2015-02-17 ENCOUNTER — Telehealth: Payer: Self-pay | Admitting: *Deleted

## 2015-02-17 NOTE — Telephone Encounter (Signed)
-----   Message from Frazier Richards, MD sent at 02/15/2015 11:36 AM EDT ----- Please tell patient that her lab work is all consistent with rheumatoid arthritis like we talked about and it is important that she see a rheumatologist to get started on medication for this to that will stop joint damage and help with her pain. She should be hearing from [place] soon about when that appointment will be.

## 2015-02-17 NOTE — Telephone Encounter (Signed)
LMOVM for pt to return call.  Will advise of the below.  Also, it looks as if Dr. Estanislado Pandy has decided not to take her case, so our referral coordinator has sent her referral to Dr. Charlestine Night. Fleeger, Salome Spotted

## 2015-02-18 ENCOUNTER — Ambulatory Visit (INDEPENDENT_AMBULATORY_CARE_PROVIDER_SITE_OTHER): Payer: Medicare Other | Admitting: Family Medicine

## 2015-02-18 ENCOUNTER — Encounter: Payer: Self-pay | Admitting: Family Medicine

## 2015-02-18 VITALS — BP 135/79 | HR 78 | Temp 98.7°F | Ht 66.0 in | Wt 194.9 lb

## 2015-02-18 DIAGNOSIS — I10 Essential (primary) hypertension: Secondary | ICD-10-CM

## 2015-02-18 DIAGNOSIS — M25461 Effusion, right knee: Secondary | ICD-10-CM | POA: Diagnosis not present

## 2015-02-18 DIAGNOSIS — M25561 Pain in right knee: Secondary | ICD-10-CM | POA: Diagnosis present

## 2015-02-18 LAB — CBC
HCT: 36.1 % (ref 36.0–46.0)
HEMOGLOBIN: 11.5 g/dL — AB (ref 12.0–15.0)
MCH: 26.1 pg (ref 26.0–34.0)
MCHC: 31.9 g/dL (ref 30.0–36.0)
MCV: 82 fL (ref 78.0–100.0)
MPV: 10.1 fL (ref 8.6–12.4)
Platelets: 307 10*3/uL (ref 150–400)
RBC: 4.4 MIL/uL (ref 3.87–5.11)
RDW: 14.5 % (ref 11.5–15.5)
WBC: 11.2 10*3/uL — AB (ref 4.0–10.5)

## 2015-02-18 LAB — C-REACTIVE PROTEIN: CRP: 7.2 mg/dL — ABNORMAL HIGH (ref <0.60)

## 2015-02-18 MED ORDER — HYDROCODONE-ACETAMINOPHEN 5-325 MG PO TABS: 1.0000 | ORAL_TABLET | Freq: Four times a day (QID) | ORAL | Status: DC | PRN

## 2015-02-18 NOTE — Assessment & Plan Note (Signed)
New problem.  Etiology is not fully clear at this time. CBC, CRP, and ESR obtained today. Knee was aspirated; fluid and I have the appearance of infection.  Fluid be sent for cell count, culture, and crystals. After seeing the aspirate I doubt this is septic arthritis;will await on the studies above. I suspect that this is from underlying rheumatologic disease given recent laboratory findings. Treating patient's pain with PRN Vicodin. I considered giving her steroids but she has a reported allergy to this. Patient to use pain medication as needed until rheumatology appointment.

## 2015-02-18 NOTE — Progress Notes (Signed)
   Subjective:    Patient ID: [name], female    DOB: [birth year], 64 y.o.   MRN: [id number]  HPI 64 year old female with a complicated past medical history presents for same day appointment with complaints of right knee pain and swelling.  1) Right knee pain  Patient reports that 3 days ago she developed significant right knee pain and associated swelling.  Pain is located diffusely around her knee.  She reports the pain is severe and is interfering with her ability to ambulate.  No known fall, trauma, injury.  No associated redness.  She denies any fever but reports chills.  Pain is exacerbated by movement/activity/weightbearing.  She's been using over-the-counter topical cream with no improvement in her pain.  Of note, patient was seen on 4/22 and rheumatological workup was obtained.  Laboratory workup was significant (see below) and appears to be consistent with rheumatoid arthritis.  Social Hx - Former smoker.   Review of Systems  Constitutional: Positive for chills. Negative for fever.  Musculoskeletal: Positive for arthralgias.      Objective:   Physical Exam Filed Vitals:   02/18/15 1337  BP: 135/79  Pulse: 78  Temp: 98.7 F (37.1 C)   Vital signs reviewed.  Exam: General: Chronically ill-appearing female in no acute distress. Knee: Right Inspection: Swelling and effusion noted. Palpation with diffuse tenderness. ROM decreased in all planes secondary to pain. Ligaments appear to be intact although exam is limited. Patellar and quadriceps tendons unremarkable.  Procedure note: After consent was obtained, using sterile technique the right knee was prepped and 5 ml's of 1% plain Lidocaine used to anesthetize the needle tract into the joint from the superior/lateral approach. The knee joint was entered and ~ 30 ml's of yellow colored fluid was withdrawn and sent for culture/fluids/gram stain/cell count.  The procedure was well tolerated.  The patient is  asked to continue to rest the knee for a few more days before resuming regular activities.  Assessment & Plan:  See Problem List

## 2015-02-18 NOTE — Patient Instructions (Addendum)
It was nice to see you today.  We will call you with the results of your labs.  Use the pain medication as needed.  Follow up with your PCP as indicated.  They will call you with the referral info.  Take care  Dr. Lacinda Axon

## 2015-02-19 LAB — SYNOVIAL CELL COUNT + DIFF, W/ CRYSTALS
CRYSTALS FLUID: NONE SEEN
Eosinophils-Synovial: 0 % (ref 0–1)
Lymphocytes-Synovial Fld: 5 % (ref 0–20)
Monocyte/Macrophage: 4 % — ABNORMAL LOW (ref 50–90)
Neutrophil, Synovial: 91 % — ABNORMAL HIGH (ref 0–25)
WBC, Synovial: 15195 cu mm — ABNORMAL HIGH (ref 0–200)

## 2015-02-19 LAB — SEDIMENTATION RATE

## 2015-02-22 LAB — BODY FLUID CULTURE
GRAM STAIN: NONE SEEN
GRAM STAIN: NONE SEEN
Organism ID, Bacteria: NO GROWTH

## 2015-03-07 ENCOUNTER — Telehealth: Payer: Self-pay | Admitting: Family Medicine

## 2015-03-07 NOTE — Telephone Encounter (Signed)
Pt was declined by Dr. Estanislado Pandy & Dr. Jolee Ewing. Sent referral to [facility] on 02/21/15. I called [facility] Rheumatology [facility]. They had received the referral but their new patient appts are out until October 2016. I called [facility] and got patient an appt with Dr. Simona Huh Ang for 7/21 at 2:30 pm. I spoke with patient and she was not even able to hold a pen to write down her appt info. I told patient I would call Cassandra, her daughter to give her the appt info. Speaking with Vito Backers, she told me that she now bathing her mother and her mother cannot even walk to the bathroom so they had to buy her a portable toilet. Daughter requesting pain management meds for patient to last her until her appt in July. Please advise.

## 2015-03-07 NOTE — Telephone Encounter (Signed)
This patient is calling because she is in a lot of pain from her rheumatoid arthritis, and she states that she has been waiting on a doctor to call her with an appointment for a specialist and she has not received any phone call as of yet. She would like to know what to do. Please advise / thanks, Fonda Kinder, ASA

## 2015-03-07 NOTE — Telephone Encounter (Signed)
[facility] stated they would contact patient with appt once their MD renewed her chart. I will attempted to call them today to check to status of the referral.

## 2015-03-07 NOTE — Telephone Encounter (Signed)
Cassandra called for her mother and wanted to know if they can get a refill on her mothers pain medication until she can get in with the specialist. Please call the daughter at [phone]. jw

## 2015-03-08 ENCOUNTER — Telehealth: Payer: Self-pay | Admitting: Family Medicine

## 2015-03-08 NOTE — Telephone Encounter (Signed)
Daughter called and wanted to know when and if the doctor is going to refill her mother's pain medication. The appointment for her rheumatologist is 05/12/15 and that is a long way off. Please call and let daughter know what to do. jw

## 2015-03-08 NOTE — Telephone Encounter (Signed)
Appointment scheduled for 5/18.

## 2015-03-08 NOTE — Telephone Encounter (Signed)
I will be happy to help try to control her pain but I would like to see her back in the office to discuss this before prescribing anything. Thanks

## 2015-03-09 ENCOUNTER — Ambulatory Visit (INDEPENDENT_AMBULATORY_CARE_PROVIDER_SITE_OTHER): Payer: Medicare Other | Admitting: Family Medicine

## 2015-03-09 ENCOUNTER — Encounter: Payer: Self-pay | Admitting: Family Medicine

## 2015-03-09 VITALS — BP 156/96 | HR 75 | Temp 98.0°F | Ht 66.0 in | Wt 205.0 lb

## 2015-03-09 DIAGNOSIS — M069 Rheumatoid arthritis, unspecified: Secondary | ICD-10-CM | POA: Diagnosis present

## 2015-03-09 MED ORDER — HYDROCODONE-ACETAMINOPHEN 5-325 MG PO TABS: 1.0000 | ORAL_TABLET | Freq: Two times a day (BID) | ORAL | Status: DC | PRN

## 2015-03-09 NOTE — Progress Notes (Signed)
Patient ID: [name], female   DOB: 10/06/1951, 64 y.o.   MRN: [id number]   HPI  Patient presents today for follow-up rheumatoid arthritis related pain  Patient spends that she has right-sided knee pain, left-sided hip pain, and right-sided low back pain that all seemed related to rheumatoid arthritis. She was getting some relief with hydrocodone that was prescribed at last visit but has run out.  She states that after the knee aspiration she had a very short amount of time with relief - less than one day.   She states that her hip and knee pain hurt so bad and slow her down so much that she can't make it to the toilet use the bathroom. She is not incontinent but rather cannot make it to the bathroom. She's now using a bedside toilet with the family has gotten her.   She has a rheumatology appointment in July and wants to know if she can have pain medicine until that time. 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  ROS: Per HPI  Objective: BP 156/96 mmHg  Pulse 75  Temp(Src) 98 F (36.7 C) (Oral)  Ht 5\' 6"  (1.676 m)  Wt 205 lb (92.987 kg)  BMI 33.10 kg/m2 Gen: NAD, alert, cooperative with exam HEENT: NCAT, EOMI, PERRL CV: RRR, good S1/S2, no murmur Resp: CTABL, no wheezes, non-labored Abd: SNTND, BS present, no guarding or organomegaly Ext: No edema, warm Neuro: Alert and oriented, No gross deficits  Msk: R knee grossly swollen with pain with any movement, no warmth, L hip with no pain with movement, R low back pain with palpation over the SI joint.    Assessment and plan:  Rheumatoid arthritis Severe R knee, L hip, and low back pain presumably related to RA Treating with norco, discussed no Rx without appt and no refill prior to 04/09/2015 Stressed importance of rheum follow up      Meds ordered this encounter  Medications  . HYDROcodone-acetaminophen (NORCO/VICODIN) 5-325 MG per tablet    Sig: Take 1 tablet by mouth 2 (two) times daily as needed  for moderate pain.    Dispense:  60 tablet    Refill:  0

## 2015-03-09 NOTE — Patient Instructions (Signed)
Great to see you!  Come back to see Dr. Sherril Cong in 1 month (around 04/06/2015)  Rheumatoid Arthritis Rheumatoid arthritis is a long-term (chronic) inflammatory disease that causes pain, swelling, and stiffness of the joints. It can affect the entire body, including the eyes and lungs. The effects of rheumatoid arthritis vary widely among those with the condition. CAUSES  The cause of rheumatoid arthritis is not known. It tends to run in families and is more common in women. Certain cells of the body's natural defense system (immune system) do not work properly and begin to attack healthy joints. It primarily involves the connective tissue that lines the joints (synovial membrane). This can cause damage to the joint. SYMPTOMS   Pain, stiffness, swelling, and decreased motion of many joints, especially in the hands and feet.  Stiffness that is worse in the morning. It may last 1-2 hours or longer.  Numbness and tingling in the hands.  Fatigue.  Loss of appetite.  Weight loss.  Low-grade fever.  Dry eyes and mouth.  Firm lumps (rheumatoid nodules) that grow beneath the skin in areas such as the elbows and hands. DIAGNOSIS  Diagnosis is based on the symptoms described, an exam, and blood tests. Sometimes, X-rays are helpful. TREATMENT  The goals of treatment are to relieve pain, reduce inflammation, and to slow down or stop joint damage and disability. Methods vary and may include:  Maintaining a balance of rest, exercise, and proper nutrition.  Medicines:  Pain relievers (analgesics).  Corticosteroids and nonsteroidal anti-inflammatory drugs (NSAIDs) to reduce inflammation.  Disease-modifying antirheumatic drugs (DMARDs) to try to slow the course of the disease.  Biologic response modifiers to reduce inflammation and damage.  Physical therapy and occupational therapy.  Surgery for patients with severe joint damage. Joint replacement or fusing of joints may be  needed.  Routine monitoring and ongoing care, such as office visits, blood and urine tests, and X-rays. HOME CARE INSTRUCTIONS   Remain physically active and reduce activity when the disease gets worse.  Eat a well-balanced diet.  Put heat on affected joints when you wake up and before activities. Keep the heat on the affected joint for as long as directed by your health care provider.  Put ice on affected joints following activities or exercising.  Put ice in a plastic bag.  Place a towel between your skin and the bag.  Leave the ice on for 15-20 minutes, 3-4 times per day, or as directed by your health care provider.  Take medicines and supplements only as directed by your health care provider.  Use splints as directed by your health care provider. Splints help maintain joint position and function.  Do not sleep with pillows under your knees. This may lead to spasms.  Participate in a self-management program to keep current with the latest treatment and coping skills. SEEK IMMEDIATE MEDICAL CARE IF:  You have fainting episodes.  You have periods of extreme weakness.  You rapidly develop a hot, painful joint that is more severe than usual joint aches.  You have chills.  You have a fever. FOR MORE INFORMATION   American College of Rheumatology: www.rheumatology.[place]: www.arthritis.org Document Released: 10/05/2000 Document Revised: 02/22/2014 Document Reviewed: 11/14/2011 [facility] Patient Information 2015 [place], [place]. This information is not intended to replace advice given to you by your health care provider. Make sure you discuss any questions you have with your health care provider.

## 2015-03-09 NOTE — Assessment & Plan Note (Signed)
Severe R knee, L hip, and low back pain presumably related to RA Treating with norco, discussed no Rx without appt and no refill prior to 04/09/2015 Stressed importance of rheum follow up

## 2015-03-30 ENCOUNTER — Ambulatory Visit (INDEPENDENT_AMBULATORY_CARE_PROVIDER_SITE_OTHER): Payer: Medicare Other | Admitting: Family Medicine

## 2015-03-30 ENCOUNTER — Encounter: Payer: Self-pay | Admitting: Family Medicine

## 2015-03-30 VITALS — BP 134/88 | HR 75 | Temp 98.0°F | Ht 66.0 in | Wt 201.0 lb

## 2015-03-30 DIAGNOSIS — M069 Rheumatoid arthritis, unspecified: Secondary | ICD-10-CM | POA: Diagnosis present

## 2015-03-30 MED ORDER — HYDROCODONE-ACETAMINOPHEN 5-325 MG PO TABS: 1.0000 | ORAL_TABLET | Freq: Two times a day (BID) | ORAL | Status: DC | PRN

## 2015-03-30 NOTE — Patient Instructions (Signed)
Great to see you!  Be sure that you keep your rheumatology appt!  Come back in 4 weeks.  Rheumatoid Arthritis Rheumatoid arthritis is a long-term (chronic) inflammatory disease that causes pain, swelling, and stiffness of the joints. It can affect the entire body, including the eyes and lungs. The effects of rheumatoid arthritis vary widely among those with the condition. CAUSES  The cause of rheumatoid arthritis is not known. It tends to run in families and is more common in women. Certain cells of the body's natural defense system (immune system) do not work properly and begin to attack healthy joints. It primarily involves the connective tissue that lines the joints (synovial membrane). This can cause damage to the joint. SYMPTOMS   Pain, stiffness, swelling, and decreased motion of many joints, especially in the hands and feet.  Stiffness that is worse in the morning. It may last 1-2 hours or longer.  Numbness and tingling in the hands.  Fatigue.  Loss of appetite.  Weight loss.  Low-grade fever.  Dry eyes and mouth.  Firm lumps (rheumatoid nodules) that grow beneath the skin in areas such as the elbows and hands. DIAGNOSIS  Diagnosis is based on the symptoms described, an exam, and blood tests. Sometimes, X-rays are helpful. TREATMENT  The goals of treatment are to relieve pain, reduce inflammation, and to slow down or stop joint damage and disability. Methods vary and may include:  Maintaining a balance of rest, exercise, and proper nutrition.  Medicines:  Pain relievers (analgesics).  Corticosteroids and nonsteroidal anti-inflammatory drugs (NSAIDs) to reduce inflammation.  Disease-modifying antirheumatic drugs (DMARDs) to try to slow the course of the disease.  Biologic response modifiers to reduce inflammation and damage.  Physical therapy and occupational therapy.  Surgery for patients with severe joint damage. Joint replacement or fusing of joints may be  needed.  Routine monitoring and ongoing care, such as office visits, blood and urine tests, and X-rays. HOME CARE INSTRUCTIONS   Remain physically active and reduce activity when the disease gets worse.  Eat a well-balanced diet.  Put heat on affected joints when you wake up and before activities. Keep the heat on the affected joint for as long as directed by your health care provider.  Put ice on affected joints following activities or exercising.  Put ice in a plastic bag.  Place a towel between your skin and the bag.  Leave the ice on for 15-20 minutes, 3-4 times per day, or as directed by your health care provider.  Take medicines and supplements only as directed by your health care provider.  Use splints as directed by your health care provider. Splints help maintain joint position and function.  Do not sleep with pillows under your knees. This may lead to spasms.  Participate in a self-management program to keep current with the latest treatment and coping skills. SEEK IMMEDIATE MEDICAL CARE IF:  You have fainting episodes.  You have periods of extreme weakness.  You rapidly develop a hot, painful joint that is more severe than usual joint aches.  You have chills.  You have a fever. FOR MORE INFORMATION   American College of Rheumatology: www.rheumatology.[place]: www.arthritis.org Document Released: 10/05/2000 Document Revised: 02/22/2014 Document Reviewed: 11/14/2011 [facility] Patient Information 2015 [place], [place]. This information is not intended to replace advice given to you by your health care provider. Make sure you discuss any questions you have with your health care provider.

## 2015-03-30 NOTE — Assessment & Plan Note (Signed)
Rheumatoid arthritis related pain , refilled hydrocodone today #60.  I emphasized that we can't give her another refill until July 8.  currently she is having current flare with some swelling and tenderness across her right second through third MCPs.  she also has generalized tenderness of her bilateral knees , bilateral wrists, bilateral elbows   discussed the importance of her rheumatology appointment which is coming up on 05/18/2015 and that they will hopefully start a DMARD and she can wean off of these narcotic pain medications.

## 2015-03-30 NOTE — Progress Notes (Signed)
Patient ID: [name], female   DOB: May 30, 1951, 64 y.o.   MRN: [id number]   HPI  Patient presents today for  Follow-up for pain  Patient has rheumatoid arthritis related pain which is most significant in her right second through  Fourth MCPs. She also notes bilateral wrist pain, lateral elbow pain, and bilateral knee pain. Additionally she has tenderness to palpation on nearly any lace on her body.   she denies fever, chills, change in appetite.  She's very frustrated with rheumatoid arthritis states that the worst part is that she can't make her own food. She has a rheumatology appointment at [facility] on 05/18/2015 coming up.   She has some pain improvement with hydrocodone , she takes on average 2 pills per day but has taken more than 2 pills per day a few days in the last month.  ROS: Per HPI  Objective: BP 134/88 mmHg  Pulse 75  Temp(Src) 98 F (36.7 C) (Oral)  Ht 5\' 6"  (1.676 m)  Wt 201 lb (91.173 kg)  BMI 32.46 kg/m2 Gen: NAD, alert, cooperative with exam HEENT: NCAT CV: RRR, good S1/S2, no murmur Resp: CTABL, no wheezes, non-labored Ext: No edema, warm Neuro: Alert and oriented, No gross deficits  Msk:  Right hand with swelling across the second third and fourth MCPs , no warmth , she has braces on bilateral wrists and right knee , she has tenderness to palpation of her bilateral lower extremities, all the joints of her hand and bilateral elbows as well as bilateral knees.  Exam is limited by pain.  Assessment and plan:  Rheumatoid arthritis  Rheumatoid arthritis related pain , refilled hydrocodone today #60.  I emphasized that we can't give her another refill until July 8.  currently she is having current flare with some swelling and tenderness across her right second through third MCPs.  she also has generalized tenderness of her bilateral knees , bilateral wrists, bilateral elbows   discussed the importance of her rheumatology appointment which is coming up  on 05/18/2015 and that they will hopefully start a DMARD and she can wean off of these narcotic pain medications.     Meds ordered this encounter  Medications  . HYDROcodone-acetaminophen (NORCO/VICODIN) 5-325 MG per tablet    Sig: Take 1 tablet by mouth 2 (two) times daily as needed for moderate pain.    Dispense:  60 tablet    Refill:  0

## 2015-04-06 ENCOUNTER — Other Ambulatory Visit: Payer: Self-pay | Admitting: *Deleted

## 2015-04-06 DIAGNOSIS — K219 Gastro-esophageal reflux disease without esophagitis: Secondary | ICD-10-CM

## 2015-04-06 MED ORDER — OMEPRAZOLE 20 MG PO CPDR: 20.0000 mg | DELAYED_RELEASE_CAPSULE | Freq: Every day | ORAL | Status: DC

## 2015-04-19 ENCOUNTER — Other Ambulatory Visit: Payer: Self-pay | Admitting: *Deleted

## 2015-04-19 DIAGNOSIS — M792 Neuralgia and neuritis, unspecified: Secondary | ICD-10-CM

## 2015-04-19 DIAGNOSIS — I10 Essential (primary) hypertension: Secondary | ICD-10-CM

## 2015-04-19 MED ORDER — CARVEDILOL 3.125 MG PO TABS: ORAL_TABLET | ORAL | Status: DC

## 2015-04-19 MED ORDER — GABAPENTIN 300 MG PO CAPS: ORAL_CAPSULE | ORAL | Status: DC

## 2015-04-19 NOTE — Telephone Encounter (Signed)
Pt is out of blood pressure medication and now has a headache.  Derl Barrow, RN

## 2015-05-03 ENCOUNTER — Telehealth: Payer: Self-pay | Admitting: Family Medicine

## 2015-05-03 NOTE — Telephone Encounter (Signed)
Needs refill on hydrocodone

## 2015-05-03 NOTE — Telephone Encounter (Signed)
It is not appropriate for her to expect narcotic refills without a clinic visit. She should make an appointment.

## 2015-05-03 NOTE — Telephone Encounter (Signed)
Daughter called back about the refill. Says the last time they needed a refill, it took several days. If it is going to take several days, then she wants another dr Please advise

## 2015-05-04 NOTE — Telephone Encounter (Signed)
Patient has an appt for Friday 05/06/15 at 2 PM.  Derl Barrow, RN

## 2015-05-06 ENCOUNTER — Encounter: Payer: Self-pay | Admitting: Family Medicine

## 2015-05-06 ENCOUNTER — Ambulatory Visit (INDEPENDENT_AMBULATORY_CARE_PROVIDER_SITE_OTHER): Payer: Medicare Other | Admitting: Family Medicine

## 2015-05-06 VITALS — BP 121/74 | HR 68 | Temp 97.5°F | Wt 201.4 lb

## 2015-05-06 DIAGNOSIS — R110 Nausea: Secondary | ICD-10-CM | POA: Diagnosis not present

## 2015-05-06 DIAGNOSIS — M069 Rheumatoid arthritis, unspecified: Secondary | ICD-10-CM

## 2015-05-06 MED ORDER — PROMETHAZINE HCL 12.5 MG PO TABS: 12.5000 mg | ORAL_TABLET | Freq: Three times a day (TID) | ORAL | Status: DC | PRN

## 2015-05-06 MED ORDER — HYDROCODONE-ACETAMINOPHEN 5-325 MG PO TABS: 1.0000 | ORAL_TABLET | Freq: Three times a day (TID) | ORAL | Status: DC | PRN

## 2015-05-06 MED ORDER — HYDROCODONE-ACETAMINOPHEN 5-325 MG PO TABS: 1.0000 | ORAL_TABLET | Freq: Two times a day (BID) | ORAL | Status: DC | PRN

## 2015-05-06 NOTE — Patient Instructions (Signed)
Dear [name], Thank you for coming in to clinic today.  1. For your Rheumatoid Arthritis and nausea due to pain: - Refilled Hydrocodone - take 1 tablet up to 3 times daily for pain - Take Phenergan 1 tablet every 8 hours as needed for nausea / vomiting - Try to eat small frequent meals, drink plenty of fluids, ensure protein shakes  Follow-up with new Rheumatologist on 7/27  Please schedule a follow-up appointment with Dr. Sherril Cong in 1 to 2 months  If you have any other questions or concerns, please feel free to call the clinic to contact me. You may also schedule an earlier appointment if necessary.  However, if your symptoms get significantly worse, please go to the Emergency Department to seek immediate medical attention.  [name], [place]

## 2015-05-06 NOTE — Assessment & Plan Note (Addendum)
Chronic stable to mildly worsening joint pain from suspected RA. Localized to bilateral hands/wrists, knees, worse R > L with hand edema and knee effusion. No evidence of infection.  Plan: 1. Refill Norco 5/325, increase from BID to TID #90 0 refills 2. Add refill Phenergan PRN for nausea with increased pain 3. Follow-up with Rheumatology as scheduled 05/18/15 at [facility] 4. RTC 1-2 months with PCP

## 2015-05-06 NOTE — Progress Notes (Signed)
   Subjective:    Patient ID: [name], female    DOB: 1951/06/08, 64 y.o.   MRN: [id number]  HPI  RHEUMATOID ARTHRITIS: - Chronic problem worsening over past 5-6 months. - Today presents for refill Hydrocodone. Last visit on 03/30/15 for same complaint. Now admits to worsening pain in both hands, elbows, knees bilaterally, except R (hand and knee) are significantly worse than Left. Associated with some swelling in her joints (mostly R hand and knee). - Taking Norco 5/325 BID with improvement - Now using bedside commode, ambulating with cane - Scheduled for new pt eval with Rheumatology at [facility] only 05/18/15 - Admits to nausea (no vomiting but some heaving) with decreased appetite - Denies any fevers/chills, abdominal pain, chest pain, SOB  I have reviewed and updated the following as appropriate: allergies and current medications  Social Hx: Former smoker  Review of Systems  See above HPI    Objective:   Physical Exam  BP 121/74 mmHg  Pulse 68  Temp(Src) 97.5 F (36.4 C) (Oral)  Wt 201 lb 6.4 oz (91.354 kg)  Gen - chronically ill-appearing patient, currently well but uncomfortable due to pain HEENT - MMM MSK / Ext - Bilateral hands/wrists with generalized +TTP R>L with Right hand / wrist soft non pitting edema, without erythema, both hands with mild contractures of fingers with overall limited ROM due to pain. Bilateral Knees with generalized +TTP anteriorly, Right knee with mild effusion without erythema, mild warmth, +creptius, limited full flex/ext ROM due to pain. peripheral pulses intact +2 b/l Skin - warm, dry, no rashes Neuro - awake, alert      Assessment & Plan:   See specific A&P problem list for details.

## 2015-06-16 ENCOUNTER — Other Ambulatory Visit: Payer: Self-pay | Admitting: *Deleted

## 2015-06-16 DIAGNOSIS — F32A Depression, unspecified: Secondary | ICD-10-CM

## 2015-06-16 DIAGNOSIS — F329 Major depressive disorder, single episode, unspecified: Secondary | ICD-10-CM

## 2015-06-16 MED ORDER — CITALOPRAM HYDROBROMIDE 20 MG PO TABS: ORAL_TABLET | ORAL | Status: DC

## 2015-07-20 ENCOUNTER — Other Ambulatory Visit: Payer: Self-pay | Admitting: *Deleted

## 2015-07-20 DIAGNOSIS — L309 Dermatitis, unspecified: Secondary | ICD-10-CM

## 2015-07-20 MED ORDER — TRIAMCINOLONE ACETONIDE 0.5 % EX OINT: TOPICAL_OINTMENT | CUTANEOUS | Status: DC

## 2015-08-15 ENCOUNTER — Ambulatory Visit: Payer: Medicaid Other | Admitting: Nurse Practitioner

## 2015-08-16 ENCOUNTER — Other Ambulatory Visit: Payer: Self-pay | Admitting: Nurse Practitioner

## 2015-08-16 MED ORDER — PHENOBARBITAL 100 MG PO TABS: 100.0000 mg | ORAL_TABLET | Freq: Every day | ORAL | Status: DC

## 2015-08-16 NOTE — Telephone Encounter (Signed)
Patient has appt scheduled next month  

## 2015-08-16 NOTE — Telephone Encounter (Signed)
Pt needs refill on PHENObarbital (LUMINAL) 100 MG tablet. Thank you

## 2015-08-17 NOTE — Telephone Encounter (Signed)
Rx has been signed and faxed  

## 2015-08-18 DIAGNOSIS — M797 Fibromyalgia: Secondary | ICD-10-CM | POA: Insufficient documentation

## 2015-08-30 ENCOUNTER — Ambulatory Visit (INDEPENDENT_AMBULATORY_CARE_PROVIDER_SITE_OTHER): Payer: Medicare Other | Admitting: Nurse Practitioner

## 2015-08-30 ENCOUNTER — Encounter: Payer: Self-pay | Admitting: Nurse Practitioner

## 2015-08-30 VITALS — BP 137/88 | HR 77 | Ht 66.0 in | Wt 211.8 lb

## 2015-08-30 DIAGNOSIS — G40309 Generalized idiopathic epilepsy and epileptic syndromes, not intractable, without status epilepticus: Secondary | ICD-10-CM | POA: Diagnosis not present

## 2015-08-30 DIAGNOSIS — Z5181 Encounter for therapeutic drug level monitoring: Secondary | ICD-10-CM

## 2015-08-30 DIAGNOSIS — G43009 Migraine without aura, not intractable, without status migrainosus: Secondary | ICD-10-CM

## 2015-08-30 MED ORDER — TOPIRAMATE 50 MG PO TABS: 50.0000 mg | ORAL_TABLET | Freq: Two times a day (BID) | ORAL | Status: DC

## 2015-08-30 MED ORDER — PHENOBARBITAL 100 MG PO TABS: 100.0000 mg | ORAL_TABLET | Freq: Every day | ORAL | Status: DC

## 2015-08-30 NOTE — Progress Notes (Signed)
I have read the note, and I agree with the clinical assessment and plan.  Kaitlyn Skowron KEITH   

## 2015-08-30 NOTE — Progress Notes (Signed)
[facility]  PATIENT: [name] DOB: 05-29-1951   REASON FOR VISIT: Follow-up for epilepsy, chronic headaches HISTORY FROM: Patient    HISTORY OF PRESENT ILLNESS:Ms Appenzeller, 64 year old female returns for followup. She has a history of seizure disorder as well as headaches. Last seizure occurred June 2013. She is currently on phenobarbital. 100mg  at night. He is also on Topamax 50 twice daily headaches. She continues to have left foot pain from previous surgery . Headaches have improved with the addition of Topamax, headaches are bitemporal and at vertex of the head. She may have nausea and vomiting with headache. She no longer drives a car. She also complains of joint pain in the knees shoulders and neck. She was recently diagnosed with fibromyalgia and rheumatoid arthritis She does not exercise. She had a fall yesterday when her right leg gave out. She is now using a cane. She has a history of multiple falls. She returns for reevaluation   HISTORY: of seizure events and a left hemisensory deficit. The patient indicates that she has gone 15 years without a seizure, and she suffered a seizure event in June 2013. The patient has been on phenobarbital, and she indicates that she has not missed a dose of the medication. In 2007, the patient had a MRI the brain that was normal. The patient indicates that over the last year, she began having headaches that are bitemporal and at the vertex of the head, associated with a throbbing pain. The headaches have gotten better with the addition of Topamax. The patient may have nausea and vomiting with the headache. The patient indicates that she does not drive a car. The patient continues to have left-sided numbness, and she feels somewhat weak on the left side. The patient has her left leg in a cast. She is ambulating with a cane. The patient has significant degenerative arthritis of the knees, and she has low back pain. MRI of the brain  05/21/12 with chronic SVD but basically unchanged from 2007. EEG was normal.     REVIEW OF SYSTEMS: Full 14 system review of systems performed and notable only for those listed, all others are neg:  Constitutional: neg  Cardiovascular: neg Ear/Nose/Throat: neg  Skin: neg Eyes: neg Respiratory: Cough Gastroitestinal: neg  Hematology/Lymphatic: neg  Endocrine: neg Musculoskeletal: Pain in the knees Allergy/Immunology: neg Neurological: Seizure disorder, history of migraines, occasional dizziness Psychiatric: neg Sleep : neg   ALLERGIES: Allergies  Allergen Reactions  . Amoxicillin     REACTION: hives  . Aspirin     REACTION: rash  . Codeine Phosphate     REACTION: N/V  . Flexeril [Cyclobenzaprine] Nausea And Vomiting    Dizziness   . Ibuprofen Hives  . Penicillins     REACTION: hives  . Prednisone     REACTION: pshycosis  . Tetanus Toxoids Swelling    HOME MEDICATIONS: Outpatient Prescriptions Prior to Visit  Medication Sig Dispense Refill  . carvedilol (COREG) 3.125 MG tablet TAKE 1 TABLET BY MOUTH TWICE A DAY WITH A MEAL 62 tablet 11  . citalopram (CELEXA) 20 MG tablet TAKE 1 TABLET BY MOUTH ONCE A DAY 30 tablet 3  . gabapentin (NEURONTIN) 300 MG capsule TAKE 3 CAPSULES BY MOUTH TWICE A DAY 180 capsule 11  . Ipratropium-Albuterol (COMBIVENT RESPIMAT) 20-100 MCG/ACT AERS respimat Inhale 1 puff into the lungs every 6 (six) hours.    Marland Kitchen nystatin (MYCOSTATIN/NYSTOP) 100000 UNIT/GM POWD One application to breast 3 times daily. 60 g 0  .  omeprazole (PRILOSEC) 20 MG capsule Take 1 capsule (20 mg total) by mouth daily. 30 capsule 11  . PHENObarbital (LUMINAL) 100 MG tablet Take 1 tablet (100 mg total) by mouth daily. 30 tablet 0  . topiramate (TOPAMAX) 50 MG tablet Take 1 tablet (50 mg total) by mouth 2 (two) times daily. 60 tablet 11  . triamcinolone ointment (KENALOG) 0.5 % APPLY TO SKIN TWICE DAILY AS DIRECTED 60 g 2  . HYDROcodone-acetaminophen (NORCO/VICODIN) 5-325 MG  per tablet Take 1 tablet by mouth 3 (three) times daily as needed for moderate pain. (Patient not taking: Reported on 08/30/2015) 90 tablet 0  . promethazine (PHENERGAN) 12.5 MG tablet Take 1 tablet (12.5 mg total) by mouth every 8 (eight) hours as needed for nausea. 60 tablet 0   No facility-administered medications prior to visit.    PAST MEDICAL HISTORY: Past Medical History  Diagnosis Date  . Hypertension   . Hyperlipidemia   . 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  . COPD (chronic obstructive pulmonary disease) ([place])   . Depression   . Personality disorder   . Chronic low back pain   . Sleep apnea     Mild. No need for CPAP.  Marland Kitchen GERD (gastroesophageal reflux disease)   . Migraine   . Stroke ([place])   . History of CVA (cerebrovascular accident)     "40 minni srokes"  . Seizures ([place])     last seizure 4 years ago  . Personal history of colonic polyp - adenoma 03/01/2014  . Fibromyalgia     08/18/15  [facility]  . Rheumatoid arthritis ([place])     08/18/15  [facility]    PAST SURGICAL HISTORY: Past Surgical History  Procedure Laterality Date  . Appendectomy    . Shoulder surgery      bil.  . Back surgery    . Cardiac catheterization  2008    Normal. Dr Acie Fredrickson.  . Knee surgery      Bilateral.  . Ankle surgery Left   . Foot tendon surgery  june 2014    both feet  . Total abdominal hysterectomy    . Cholecystectomy    . Nose surgery    . Cyst excision      leg  . Bunionectomy      2 toes  . Flank mass excision      FAMILY HISTORY: Family History  Problem Relation Age of Onset  . Cirrhosis Mother   . Diabetes Mother   . Cirrhosis Father   . Breast cancer Sister   . Breast cancer Sister   . Colon cancer Neg Hx     SOCIAL HISTORY: Social History   Social History  . Marital Status: Married    Spouse Name: Clearence   . Number of Children: 1  . Years of Education: 11   Occupational History  . Unemployed     Social History Main  Topics  . Smoking status: Former Research scientist (life sciences)  . Smokeless tobacco: Former Systems developer    Quit date: 11/03/2010  . Alcohol Use: No  . Drug Use: No  . Sexual Activity: Not on file   Other Topics Concern  . Not on file   Social History Narrative   Patient lives at home with her husband [name].    Patient is currently not working.    Patient has 1 child.    Patient has a 11th grade education.      PHYSICAL EXAM  Filed Vitals:  08/30/15 1516  BP: 137/88  Pulse: 77  Height: 5\' 6"  (1.676 m)  Weight: 211 lb 12.8 oz (96.072 kg)   Body mass index is 34.2 kg/(m^2). Generalized: Well developed, moderately obese female in no acute distress  Head: normocephalic and atraumatic,. Oropharynx benign  Neck: Supple, no carotid bruits  Cardiac: Regular rate rhythm, no murmur  Neurological examination  Mentation: Alert oriented to time, place, history taking. Follows all commands speech and language fluent  Cranial nerve II-XII: Pupils were equal round reactive to light extraocular movements were full, visual field were full on confrontational test. Facial sensation and strength were normal. hearing was intact to finger rubbing bilaterally. Uvula tongue midline. head turning and shoulder shrug were normal and symmetric.Tongue protrusion into cheek strength was normal.  Motor: normal bulk and tone, full strength in the BUE, BLE,   Sensory: normal and symmetric to light touch, pinprick, and vibration  Coordination: finger-nose-finger, heel-to-shin bilaterally, no dysmetria  Reflexes: Depressed bilaterally upper and lower  Gait and Station: Rising up from seated position without assistance, ambulating short distances in the hall, no difficulty with turns, using single point cane. Romberg negative  DIAGNOSTIC DATA (LABS, IMAGING, TESTING) - I reviewed patient records, labs, notes, testing and imaging myself where available.  Lab Results  Component Value Date   WBC 11.2* 02/18/2015   HGB  11.5* 02/18/2015   HCT 36.1 02/18/2015   MCV 82.0 02/18/2015   PLT 307 02/18/2015      Component Value Date/Time   NA 137 01/24/2015 1449   NA 140 09/01/2014 1600   NA 145* 12/22/2013 1438   K 4.6 01/24/2015 1449   K 4.3 09/01/2014 1600   CL 103 01/24/2015 1449   CL 111* 09/01/2014 1600   CO2 24 01/24/2015 1449   CO2 18* 09/01/2014 1600   GLUCOSE 82 01/24/2015 1449   GLUCOSE 78 09/01/2014 1600   GLUCOSE 130* 12/22/2013 1438   BUN 16 01/24/2015 1449   BUN 10 09/01/2014 1600   BUN 13 12/22/2013 1438   CREATININE 0.63 01/24/2015 1449   CREATININE 0.66 09/01/2014 1600   CREATININE 0.65 12/22/2013 1438   CALCIUM 8.7 01/24/2015 1449   CALCIUM 8.3* 09/01/2014 1600   PROT 7.6 01/24/2015 1449   PROT 6.8 08/16/2014 0431   PROT 6.9 12/22/2013 1438   ALBUMIN 3.7 01/24/2015 1449   ALBUMIN 2.7* 08/16/2014 0431   ALBUMIN 4.4 12/22/2013 1438   AST 19 01/24/2015 1449   AST 16 08/16/2014 0431   ALT 15 01/24/2015 1449   ALT 17 08/16/2014 0431   ALKPHOS 103 01/24/2015 1449   ALKPHOS 86 08/16/2014 0431   BILITOT 0.2 01/24/2015 1449   BILITOT 0.3 08/16/2014 0431   GFRNONAA >60 09/01/2014 1600   GFRNONAA 94 12/22/2013 1438   GFRNONAA >60 07/13/2013 1013   GFRNONAA 84 08/26/2012 1520   GFRAA >60 09/01/2014 1600   GFRAA 109 12/22/2013 1438   GFRAA >60 07/13/2013 1013   GFRAA >89 08/26/2012 1520       ASSESSMENT AND PLAN  64 y.o. year old female  has a past medical history of Hypertension; Hyperlipidemia; Conversion disorder with seizures or convulsions; COPD (chronic obstructive pulmonary disease) ([place]); Depression; Personality disorder; Chronic low back pain; Sleep apnea;  Migraine; Stroke ([place]); History of CVA (cerebrovascular accident); Seizures ([place]);  Fibromyalgia; and Rheumatoid arthritis ([place]). here to follow-up for seizure disorder and migraines which are well controlled  PLAN Continue Topamax at current dose will refill Continue phenobarbital at current dose will  refill  Will check phenobarbital level Reviewed CBC CMP from 07/2015  Followup yearly and when necessary Dennie Bible, [facility], [facility], [facility] [address], [place], [place] [address] [phone]

## 2015-08-30 NOTE — Patient Instructions (Signed)
Continue Topamax at current dose will refill Continue phenobarbital at current dose will refill Will check phenobarbital level Reviewed CBC CMP from [facility]  07/2015 Followup yearly and when necessary

## 2015-09-01 ENCOUNTER — Telehealth: Payer: Self-pay | Admitting: *Deleted

## 2015-09-01 LAB — PHENOBARBITAL LEVEL: Phenobarbital, Serum: 24 ug/mL (ref 15–40)

## 2015-09-01 NOTE — Telephone Encounter (Signed)
-----   Message from Dennie Bible, NP sent at 09/01/2015  8:24 AM EST ----- Good level of Phenobarb please call the patient

## 2015-09-01 NOTE — Telephone Encounter (Signed)
Patient returned call. Patient advised phenobarb level was good.

## 2015-09-01 NOTE — Telephone Encounter (Signed)
VM not set up on mobile number or home number. Could not leave message. If pt calls back, ok to inform her phenobarb level was good.

## 2015-09-02 ENCOUNTER — Encounter: Payer: Self-pay | Admitting: Family Medicine

## 2015-09-05 ENCOUNTER — Ambulatory Visit (INDEPENDENT_AMBULATORY_CARE_PROVIDER_SITE_OTHER): Payer: Medicare Other | Admitting: Family Medicine

## 2015-09-05 ENCOUNTER — Encounter: Payer: Self-pay | Admitting: Family Medicine

## 2015-09-05 VITALS — BP 143/96 | HR 73 | Temp 98.3°F | Ht 66.0 in | Wt 210.9 lb

## 2015-09-05 DIAGNOSIS — M797 Fibromyalgia: Secondary | ICD-10-CM | POA: Diagnosis not present

## 2015-09-05 DIAGNOSIS — J069 Acute upper respiratory infection, unspecified: Secondary | ICD-10-CM

## 2015-09-05 DIAGNOSIS — R296 Repeated falls: Secondary | ICD-10-CM | POA: Diagnosis not present

## 2015-09-05 DIAGNOSIS — M0579 Rheumatoid arthritis with rheumatoid factor of multiple sites without organ or systems involvement: Secondary | ICD-10-CM | POA: Diagnosis not present

## 2015-09-05 DIAGNOSIS — B9789 Other viral agents as the cause of diseases classified elsewhere: Secondary | ICD-10-CM

## 2015-09-05 MED ORDER — HYDROCODONE-ACETAMINOPHEN 5-325 MG PO TABS: 1.0000 | ORAL_TABLET | Freq: Three times a day (TID) | ORAL | Status: DC | PRN

## 2015-09-05 MED ORDER — BENZONATATE 200 MG PO CAPS: 200.0000 mg | ORAL_CAPSULE | Freq: Two times a day (BID) | ORAL | Status: DC | PRN

## 2015-09-05 NOTE — Patient Instructions (Signed)
You will be contacted to set up an appointment with neurorehab for a thorough evaluation for a wheelchair.  Please follow up with me at their directions when the evaluation is ready.

## 2015-09-09 NOTE — Assessment & Plan Note (Signed)
Cough, congestion for 1 week, no fever, lungs clear. Pt has compromised immune system - symptomatic treatment - tessalon, rec honey, humidifier - return for worsening or fever, strict return precautions given to patient and daughter/caretaker

## 2015-09-09 NOTE — Assessment & Plan Note (Addendum)
Managed by [facility] rheum, on methotrexate, tapering prednisone, pain not improved yet, requests wheel chair for frequent falls and difficult ambulating even with walker at home - refer to neurorehab for strengthening/safety as well as wheelchair eval requested by patient and daughter - hydrocodone for pain control, discussed not using daily and that this is just to be used in the short term while awaiting adequate rheumatologic treatment - rec miralax if develops constipation on narcotics - rtc 1 month

## 2015-09-09 NOTE — Progress Notes (Signed)
Subjective:   [name] is a 64 y.o. female with a history of CVA, RA, fibromyalgia, seizures, falls here for wheelchair eval.  Pt states she needs a wheelchair due to difficulty getting around her house secondary to pain, weakness and frequent falls. Patient appears quite unsteady ambulating in clinic with cane and she and daughter report she is not much better with her walker.   URI  Has been sick for 7 days. Nasal discharge: clear Medications tried: benadryl Sick contacts: none  Symptoms Fever: no Headache or face pain: no Tooth pain: no Sneezing: no Scratchy throat: yes Allergies: no Muscle aches: no Severe fatigue: not new Stiff neck: no Shortness of breath: no Rash: no Sore throat or swollen glands: no  Review of Systems:  Per HPI. All other systems reviewed and are negative.   PMH, PSH, Medications, Allergies, and FmHx reviewed and updated in EMR.  Social History: former smoker  Objective:  BP 143/96 mmHg  Pulse 73  Temp(Src) 98.3 F (36.8 C) (Oral)  Ht 5\' 6"  (1.676 m)  Wt 210 lb 14.4 oz (95.664 kg)  BMI 34.06 kg/m2  Gen:  64 y.o. female in NAD HEENT: NCAT, MMM, EOMI, PERRL, anicteric sclerae CV: RRR, no MRG, no JVD Resp: Non-labored, CTAB, no wheezes noted Abd: Soft, NTND, BS present, no guarding or organomegaly Ext: WWP, no edema MSK: Full ROM, strength intact Neuro: Alert and oriented, speech normal. Generalized weakness consistent with deconditioning and pain likely limiting effort      Chemistry      Component Value Date/Time   NA 137 01/24/2015 1449   NA 140 09/01/2014 1600   NA 145* 12/22/2013 1438   K 4.6 01/24/2015 1449   K 4.3 09/01/2014 1600   CL 103 01/24/2015 1449   CL 111* 09/01/2014 1600   CO2 24 01/24/2015 1449   CO2 18* 09/01/2014 1600   BUN 16 01/24/2015 1449   BUN 10 09/01/2014 1600   BUN 13 12/22/2013 1438   CREATININE 0.63 01/24/2015 1449   CREATININE 0.66 09/01/2014 1600   CREATININE 0.65 12/22/2013 1438        Component Value Date/Time   CALCIUM 8.7 01/24/2015 1449   CALCIUM 8.3* 09/01/2014 1600   ALKPHOS 103 01/24/2015 1449   ALKPHOS 86 08/16/2014 0431   AST 19 01/24/2015 1449   AST 16 08/16/2014 0431   ALT 15 01/24/2015 1449   ALT 17 08/16/2014 0431   BILITOT 0.2 01/24/2015 1449   BILITOT 0.3 08/16/2014 0431      Lab Results  Component Value Date   WBC 11.2* 02/18/2015   HGB 11.5* 02/18/2015   HCT 36.1 02/18/2015   MCV 82.0 02/18/2015   PLT 307 02/18/2015   Lab Results  Component Value Date   TSH 2.520 02/11/2014   Lab Results  Component Value Date   HGBA1C 5.6 12/28/2013   Assessment & Plan:     [name] is a 64 y.o. female here for wheelchair eval  Viral URI with cough Cough, congestion for 1 week, no fever, lungs clear. Pt has compromised immune system - symptomatic treatment - tessalon, rec honey, humidifier - return for worsening or fever, strict return precautions given to patient and daughter/caretaker  Rheumatoid arthritis Managed by [facility] rheum, on methotrexate, tapering prednisone, pain not improved yet, requests wheel chair for frequent falls and difficult ambulating even with walker at home - refer to neurorehab for strengthening/safety as well as wheelchair eval requested by patient and daughter - hydrocodone for pain  control, discussed not using daily and that this is just to be used in the short term while awaiting adequate rheumatologic treatment - rec miralax if develops constipation on narcotics - rtc 1 month     Beverlyn Roux, MD, MPH [facility] PGY-3 09/09/2015 10:49 AM    ]

## 2015-09-30 ENCOUNTER — Telehealth: Payer: Self-pay

## 2015-09-30 NOTE — Telephone Encounter (Signed)
Michealle Camelo Daughter [phone]  Cassandra dropped off [employer] for Dr Jorja Loa to view, per Jerine Pain mgt. Doctor.

## 2015-10-06 ENCOUNTER — Ambulatory Visit: Payer: Medicare Other | Admitting: Family Medicine

## 2015-10-06 ENCOUNTER — Ambulatory Visit: Payer: Medicare Other | Admitting: Internal Medicine

## 2015-10-13 ENCOUNTER — Other Ambulatory Visit: Payer: Self-pay | Admitting: *Deleted

## 2015-10-13 DIAGNOSIS — F329 Major depressive disorder, single episode, unspecified: Secondary | ICD-10-CM

## 2015-10-13 DIAGNOSIS — F32A Depression, unspecified: Secondary | ICD-10-CM

## 2015-10-13 MED ORDER — CITALOPRAM HYDROBROMIDE 20 MG PO TABS: ORAL_TABLET | ORAL | Status: DC

## 2015-12-29 ENCOUNTER — Ambulatory Visit: Payer: Medicare Other | Admitting: Physical Therapy

## 2016-01-02 ENCOUNTER — Other Ambulatory Visit: Payer: Self-pay | Admitting: *Deleted

## 2016-01-02 DIAGNOSIS — L309 Dermatitis, unspecified: Secondary | ICD-10-CM

## 2016-01-02 MED ORDER — TRIAMCINOLONE ACETONIDE 0.5 % EX OINT: TOPICAL_OINTMENT | CUTANEOUS | Status: DC

## 2016-01-26 ENCOUNTER — Ambulatory Visit: Payer: Medicare Other | Attending: Family Medicine | Admitting: Physical Therapy

## 2016-01-26 DIAGNOSIS — R2689 Other abnormalities of gait and mobility: Secondary | ICD-10-CM | POA: Insufficient documentation

## 2016-01-26 NOTE — Therapy (Signed)
[facility] [address] [place], [place], [address] Phone: [phone]   Fax:  [phone]  Physical Therapy Treatment  Patient Details  Name: [name] MRN: [id number] Date of Birth: [birth year] No Data Recorded  Encounter Date: 01/26/2016      PT End of Session - 01/26/16 1144    PT Start Time 1020   PT Stop Time 1130   PT Time Calculation (min) 70 min      Past Medical History  Diagnosis Date  . Hypertension   . Hyperlipidemia   . 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  . COPD (chronic obstructive pulmonary disease) ([place])   . Depression   . Personality disorder   . Chronic low back pain   . Sleep apnea     Mild. No need for CPAP.  Marland Kitchen GERD (gastroesophageal reflux disease)   . Migraine   . Stroke ([place])   . History of CVA (cerebrovascular accident)     "36 minni srokes"  . Seizures ([place])     last seizure 4 years ago  . Personal history of colonic polyp - adenoma 03/01/2014  . Fibromyalgia     08/18/15  [facility]  . Rheumatoid arthritis ([place])     08/18/15  [facility]    Past Surgical History  Procedure Laterality Date  . Appendectomy    . Shoulder surgery      bil.  . Back surgery    . Cardiac catheterization  2008    Normal. Dr Acie Fredrickson.  . Knee surgery      Bilateral.  . Ankle surgery Left   . Foot tendon surgery  june 2014    both feet  . Total abdominal hysterectomy    . Cholecystectomy    . Nose surgery    . Cyst excision      leg  . Bunionectomy      2 toes  . Flank mass excision      There were no vitals filed for this visit.  Visit Diagnosis:  Other abnormalities of gait and mobility       Mobility/Seating Evaluation    PATIENT INFORMATION: Name: [name] DOB: 1951/05/04  Sex: Female Date seen: 01/26/16 Time: 1015  Address:  [address] [place] [address] Physician: Hattie Perch. Sherril Cong, MD This evaluation/justification form will serve as the  LMN for the following suppliers: __________________________ Supplier: [facility] Contact Person: Luz Brazen, ATP Phone:  [phone]    Seating Therapist: Mady Haagensen, PT Phone:   [phone]   Phone: [phone] (Home) *Preferred* [phone] (Mobile)    Spouse/Parent/Caregiver name: Kellene Mccleary, sister  Phone number: [phone] Insurance/Payer: Medicare/Medicaid     Reason for Referral: to pursue wheelchair due to frequent falls  Patient/Caregiver Goals: to not fall as much  Patient was seen for face-to-face evaluation for new power wheelchair.  Also present was Daisey Must, sister, Luz Brazen, ATP to discuss recommendations and wheelchair options.  Further paperwork was completed and sent to vendor.  Patient appears to qualify for power mobility device at this time per objective findings.   MEDICAL HISTORY: Diagnosis: Primary Diagnosis: Rheumatoid Arthritis (M05.79) Onset: 2016 Diagnosis: Fibromyalgia (M79.1),  COPD (J44.9)   _0 Progressive Disease Relevant past and future surgeries: NA   Height: 5'6" Weight: 208 lbs Explain recent changes or trends in weight: ?????   History including Falls: Pt reports history of falls, which occur nearly every other day.  Pt  reports legs just give way.    HOME ENVIRONMENT: _0 House  _1 Condo/town home  _2 Apartment  _3 Assisted Living    _4 Lives Alone _5  Lives with Others                                                                                          Hours with caregiver: ?????  _6 Home is accessible to patient           Stairs      _7 Yes _8  No     Ramp _9 Yes _10 No Comments:  Level entry into front entrance.   COMMUNITY ADL: TRANSPORTATION: _11 Car    _12 Van    <JIRCVELFYBOFBPZW>_2<\/HENIDPOEUMPNTIRW>_43 Public Transportation    _14 Adapted w/c Lift    _15 Ambulance    _16 Other:       _17 Sits in wheelchair during transport  Employment/School: ????? Specific requirements pertaining to mobility ?????  Other: ?????    FUNCTIONAL/SENSORY PROCESSING  SKILLS:  Handedness:   _18 Right     _19 Left    _20 NA  Comments:  ?????  Functional Processing Skills for Wheeled Mobility _21 Processing Skills are adequate for safe wheelchair operation  Areas of concern than may interfere with safe operation of wheelchair Description of problem   _22  Attention to environment      _23 Judgment      _24  Hearing  _25  Vision or visual processing      _26 Motor Planning  _27  Fluctuations in Behavior  ?????    VERBAL COMMUNICATION: _28 WFL receptive _29  WFL expressive _30 Understandable  _31 Difficult to understand  _32 non-communicative _33  Uses an augmented communication device  CURRENT SEATING / MOBILITY: Current Mobility Base:  _34 None _35 Dependent _36 Manual _37 Scooter _38 Power  Type of Control: ?????  Manufacturer:  ?????Size:  ?????Age: ?????  Current Condition of Mobility Base:  ?????   Current Wheelchair components:  ?????  Describe posture in present seating system:  ?????      SENSATION and SKIN ISSUES: Sensation _39 Intact  _40 Impaired _41 Absent  Level of sensation: ????? Pressure Relief: Able to perform effective pressure relief :    _42 Yes  _43  No Method: Change of positions, weigthshifting If not, Why?: ?????  Skin Issues/Skin Integrity Current Skin Issues  _44 Yes _45 No _46 Intact _47  Red area_48  Open Area  _49 Scar Tissue _50 At risk from prolonged sitting Where  ?????  History of Skin Issues  _51 Yes _52 No Where  ????? When  ?????  Hx of skin flap surgeries  _53 Yes _54 No Where  ????? When  ?????  Limited sitting tolerance _55 Yes _56 No Hours spent sitting in wheelchair daily: Pt reports lying down a lot during the day.  Sits brief periods during the day.  Complaint of Pain:  Please describe: Pt c/o 8-9/10 pain in bilateral arms, R hip, leg, feet and back.  Pain medications alleviate pain.  Sitting too long, too much activity aggravates pain   Swelling/Edema: Pt c/o occasional edema in lower legs, arms and hands due to RA.   ADL STATUS (in reference to wheelchair  use):  Indep Assist Unable Indep with Equip Not assessed Comments  Dressing X X ????? ????? ????? From sitting position, takes extra time; family assists when able  Eating X ????? ????? ????? ?????  with L hand  Toileting ????? X ????? ????? ????? Needs assist getting up from the toilet  Bathing ????? X ????? ????? ????? ?????  Grooming/Hygiene X ????? ????? ????? ????? Leans on sink  Meal Prep ????? X ????? ????? ????? ?????  IADLS ????? ????? ????? ????? X ?????  Bowel Management: _0 Continent  _1 Incontinent  _2 Accidents Comments:  Reports not being able to get to bathroom fast enough  Bladder Management: _3 Continent  _4 Incontinent  _5 Accidents Comments:  ?????     WHEELCHAIR SKILLS: Manual w/c Propulsion: _6 UE or LE strength and endurance sufficient to participate in ADLs using manual wheelchair Arm : _7 left _8 right   _9 Both      Distance: ????? Foot:  _10 left _11 right   _12 Both  Operate Scooter: _13  Strength, hand grip, balance and transfer appropriate for use _14 Living environment is accessible for use of scooter  Operate Power w/c:  _15  Std. Joystick   _16  Alternative Controls Indep _17  Assist _18  Dependent/unable _19  N/A _20   _21 Safe          _22  Functional      Distance: 150 ft in gym area, including negotiating furniture, doorways  Bed confined without wheelchair _23  Yes _24  No   STRENGTH/RANGE OF MOTION:  Active Range of Motion Strength  Shoulder R shoulder flexion: 68 degrees  L shoulder flexion:  80 degrees (limited by pain)   R shoulder abduction:  45, L shoulder abduction 65 bilateral shoulder flexion and abduction 3-/5  Elbow R elbow flexion 90 degrees.  R elbow extension limited due to pain (-45 degrees-stops due to pain).  L elbow flexion 80 degrees, extension WFL. bilatera elbow flexion and extension 3-/5  Wrist/Hand Grossly assessed R wrist flexion and extension 20 degrees limited due to pain.  L wrist flexion and extension WFL. wrist flexion and extension 3-/5  Hip NA HIp  flexion R and L 3-/5  Knee Limited in A/ROM knee flexion and extension in sitting due to pain L knee flexion 3+/5, L knee extension 3-/5.  R knee flexion 3-/5, R knee extension 3-/5  Ankle R ankle dorsiflexion -3 degrees from neutral, L ankle dorsiflexion -5 degrees from neutral R and L ankle dorsiflexion 3-/5     MOBILITY/BALANCE:  _25  Patient is totally dependent for mobility  ?????    Balance Transfers Ambulation  Sitting Balance: Standing Balance: _26  Independent _27  Independent/Modified Independent  _28  WFL     _29  WFL _30  Supervision _31  Supervision  _32  Uses UE for balance  _33  Supervision _34  Min Assist _35  Ambulates with Assist  Requires mod assist for safety    _36  Min Assist _37  Min assist _38  Mod Assist _39  Ambulates with Device:      _40  RW  _41  StW  _42  Cane  _43  35 ft  _44  Mod Assist _45  Mod assist _46  Max assist   _47  Max Assist _48  Max assist _49  Dependent _50  Indep. Short Distance Only  _51  Unable _52  Unable _53  Lift / Sling Required Distance (in feet)  ?????   _54  Sliding board _55  Unable to Ambulate (see explanation below)  Cardio Status:  _56 Intact  _57  Impaired   _58  NA     ?????  Respiratory Status:  _59 Intact   _60 Impaired   _61 NA     COPD  Orthotics/Prosthetics: NA  Comments (Address manual vs power w/c vs scooter): Gait velocity in 10 M walk test:  47.90 (0.68 ft/sec) with cane with mod assist due to forward lean, occasional L veering, and decreased L foot clearance.  (<  1.8 ft/sec indicates recurrent fall risk; <1.3 ft/sec indicates household ambulator.)  Timed Up and Go:  44.58 sec (>13.5 sec indicates increased fall risk; >30 sec indicates difficulty with ADLs.)  Manual wheelchair not appropriate for patient due to pt's ROM, strength and pain limitations in bilateral UEs.  Scooter not appropriate due to limited shoulder ROM and pain in hands.         Anterior / Posterior Obliquity Rotation-Pelvis Can sit upright for short periods, but tends to sit slouched position in chair with posterior  pelvic tilt, with occasional lean to either R or L.  PELVIS    _0  _1  _2   Neutral Posterior Anterior  _3  _4  _5   WFL Rt elev Lt elev  _6  _7  _8   WFL Right Left                      Anterior    Anterior     _9  Fixed _10  Other _11  Partly Flexible _12  Flexible   _13  Fixed _14  Other _15  Partly Flexible  _16  Flexible  _17  Fixed _18  Other _19  Partly Flexible  _20  Flexible   TRUNK  _21  _22  _23   WFL ? Thoracic ? Lumbar  Kyphosis Lordosis  _24  _25  _26   WFL Convex Convex  Right Left _27 c-curve _28 s-curve _29 multiple  _30  Neutral _31  Left-anterior _32  Right-anterior     _33  Fixed _34  Flexible _35  Partly Flexible _36  Other  _37  Fixed _38  Flexible _39  Partly Flexible _40  Other  _41  Fixed             _42  Flexible _43  Partly Flexible _44  Other    Position Windswept  ?????  HIPS          _45            _46               _47    Neutral       Abduct        ADduct         _48           _49            _50   Neutral Right           Left      _51  Fixed _52  Subluxed _53  Partly Flexible _54  Dislocated _55  Flexible  _56  Fixed _57  Other _58  Partly Flexible  _59  Flexible                 Foot Positioning Knee Positioning  ?????    _60  WFL  _61 Lt _62 Rt _63  WFL  _64 Lt _65 Rt    KNEES ROM concerns: ROM concerns:    & Dorsi-Flexed _66 Lt _67 Rt ?????    FEET Plantar Flexed _68 Lt _69 Rt      Inversion                 _70 Lt _71 Rt      Eversion                 _72 Lt _73 Rt     HEAD _74  Functional _75  Good Head Control  ?????  & _76  Flexed         _77  Extended _78  Adequate Head Control    NECK _79  Rotated  Lt  _80  Lat Flexed Lt _81  Rotated  Rt _82  Lat Flexed Rt _83  Limited Head Control     _84  Cervical Hyperextension _85  Absent  Head Control     SHOULDERS ELBOWS WRIST& HAND slightly rounded shoulders  Left     Right    Left     Right    Left     Right   U/E _0 Functional           _1 Functional ????? ????? _2 Fisting             _3 Fisting      _4 elev   _5 dep      _6 elev   _7 dep       _8 pro -_9 retract     _10 pro  _11 retract _12 subluxed             _13 subluxed            Goals for Wheelchair Mobility  _14  Independence with mobility in the home with motor related ADLs (MRADLs)  _15  Independence with MRADLs in the community _16  Provide dependent mobility  _17  Provide recline     _18 Provide tilt   Goals for Seating system _19  Optimize pressure distribution _20  Provide support needed to facilitate function or safety _21  Provide corrective forces to assist with maintaining or improving posture _22  Accommodate client's posture:   current seated postures and positions are not flexible or will not tolerate corrective forces _23  Client to be independent with relieving pressure in the wheelchair _24 Enhance physiological function such as breathing, swallowing, digestion  Simulation ideas/Equipment trials:Trial of [employer] Select 6, in therapy gym area x 150 ft, negotiating furniture, doorways with cues only. State why other equipment was unsuccessful:?????   MOBILITY BASE RECOMMENDATIONS and JUSTIFICATION: MOBILITY COMPONENT JUSTIFICATION  Manufacturer: Theatre manager: Select 6   Size: Width 20"Seat Depth 18" _25 provide transport from point A to B      _26 promote Indep mobility  _27 is not a safe, functional ambulator _28 walker or cane inadequate _29 non-standard width/depth necessary to accommodate anatomical measurement _30  ?????  _31 Manual Mobility Base _32 non-functional ambulator    _33 Scooter/POV  _34 can safely operate  _35 can safely transfer   _36 has adequate trunk stability  _37 cannot functionally propel manual w/c  _38 Power Mobility Base  _39 non-ambulatory  _40 cannot functionally propel manual wheelchair  _41  cannot functionally and safely operate scooter/POV _42 can safely operate and willing to  _43 Stroller Base _44 infant/child  _45 unable to propel manual wheelchair _46 allows for growth _47 non-functional ambulator _48 non-functional UE _49 Indep mobility is not a goal at this time  _50 Tilt  _51 Forward _52 Backward _53 Powered tilt  _54 Manual tilt  _55 change position against  gravitational force on head and shoulders  _56 change position for pressure relief/cannot weight shift _57 transfers  _58 management of tone _59 rest periods _60 control edema _61 facilitate postural control  _62  ?????  _63 Recline  _64 Power recline on power base _65 Manual recline on manual base  _66 accommodate femur to back angle  _67 bring to full recline for ADL care  _68 change position for pressure relief/cannot weight shift _69 rest periods _70 repositioning for transfers or clothing/diaper /catheter changes _71 head positioning  _72 Lighter weight required _73 self- propulsion  _74 lifting _75  ?????  _76 Heavy Duty required _77 user weight greater than 250# _78 extreme tone/ over active movement _79 broken frame on previous chair _80  ?????  _81  Back  _82  Angle Adjustable _83  Custom molded Captain's Seat _84 postural control _85 control of tone/spasticity _86 accommodation of range of motion _87 UE functional control _88 accommodation for seating system _89  ????? _90 provide lateral trunk support _91 accommodate deformity _92 provide posterior trunk support _93 provide lumbar/sacral support _94 support trunk in midline _95 Pressure relief over spinal processes  _96  Seat Cushion Captain's Seat _97 impaired sensation  _98 decubitus ulcers present _99 history of pressure ulceration _100 prevent pelvic extension _101 low maintenance  _102 stabilize pelvis  _103 accommodate obliquity _104 accommodate multiple deformity _105 neutralize lower extremity position _106 increase pressure distribution _107  ?????  _108   Pelvic/thigh support  _0  Lateral thigh guide _1  Distal medial pad  _2  Distal lateral pad _3  pelvis in neutral _4 accommodate pelvis _5  position upper legs _6  alignment _7  accommodate ROM _8  decr adduction _9 accommodate tone _10 removable for transfers _11 decr abduction  _12  Lateral trunk Supports _13  Lt     _14  Rt _15 decrease lateral trunk leaning _16 control tone _17 contour for increased contact _18 safety  _19 accommodate asymmetry _20  ?????  _21  Mounting  hardware  _22 lateral trunk supports  _23 back   _24 seat _25 headrest      _26  thigh support _27 fixed   _28 swing away _29 attach seat platform/cushion to w/c frame _30 attach back cushion to w/c frame _31 mount postural supports _32 mount headrest  _33 swing medial thigh support away _34 swing lateral supports away for transfers  _35  ?????    Armrests  _36 fixed _37 adjustable height _38 removable   _39 swing away  _40 flip back   _41 reclining _42 full length pads _43 desk    _44 pads tubular  _45 provide support with elbow at 90   _46 provide support for w/c tray _47 change of height/angles for variable activities _48 remove for transfers _49 allow to come closer to table top _50 remove for access to tables _51  ?????  Hangers/ Leg rests  _52 60 _53 70 _54 90 _55 elevating _56 heavy duty  _57 articulating _58 fixed _59 lift off _60 swing away     _61 power _62 provide LE support  _63 accommodate to hamstring tightness _64 elevate legs during recline   _65 provide change in position for Legs _66 Maintain placement of feet on footplate _67 durability _68 enable transfers _69 decrease edema _70 Accommodate lower leg length _71  ?????  Foot support Footplate    <ERXVQMGQQPYPPJKD>_3<\/OIZTIWPYKDXIPJAS>_50 Lt  _73  Rt  _74  Center mount _75 flip up     _76 depth/angle adjustable _77 Amputee adapter    _78  Lt     _79  Rt _80 provide foot support _81 accommodate to ankle ROM _82 transfers _83 Provide support for residual extremity _84  allow foot to go under wheelchair base _85  decrease tone  _86  ?????  _87  Ankle strap/heel loops _88 support foot on foot support _89 decrease extraneous movement _90 provide input to heel  _91 protect foot  Tires: _92 pneumatic  _93 flat free inserts  _94 solid  _95 decrease maintenance  _96 prevent frequent flats _97 increase shock absorbency _98 decrease pain from road shock _99 decrease spasms from road shock _100  ?????  _101  Headrest  _102 provide posterior head support _103 provide posterior neck support _104 provide lateral head support _105 provide anterior head support _106 support during tilt and  recline _107 improve feeding   _108 improve respiration _109 placement of switches _110 safety  _111 accommodate ROM  _112 accommodate tone _113 improve visual orientation  _114  Anterior chest strap _115  Vest _116  Shoulder retractors  _117 decrease forward movement of shoulder _118 accommodation of TLSO _119 decrease forward movement of trunk _120 decrease shoulder elevation _121 added abdominal support _122 alignment _123 assistance with shoulder control  _124  ?????  Pelvic Positioner _125 Belt _126 SubASIS bar _127 Dual Pull _128 stabilize tone _129 decrease falling out of chair/ **will not Decr potential for sliding due to pelvic tilting _130 prevent excessive rotation _131 pad for protection over boney prominence _132 prominence comfort _133 special pull angle to control rotation _134  ?????  Upper Extremity Support _135 L   _136  R _137 Arm trough    _138 hand support _139  tray       _140 full tray _141 swivel mount _142 decrease edema      _143 decrease subluxation   _144 control tone   _145 placement for AAC/Computer/EADL _146 decrease gravitational pull on shoulders _147 provide midline positioning _148 provide support to increase UE function _149 provide hand support in natural position _150 provide work surface   POWER WHEELCHAIR CONTROLS  _151 Proportional  _152 Non-Proportional Type Joystick _153 Left  _154 Right _155 provides access for controlling wheelchair   _156 lacks motor control to operate proportional drive control <NLZJQBHALPFXTKWI>_0<\/XBDZHGDJMEQASTMH>_962 unable to understand proportional controls  Actuator Control Module  _0 Single  _1 Multiple   _2 Allow the client to operate the power seat function(s) through the joystick control   _3 Safety Reset Switches _4 Used to change modes and stop the wheelchair when driving in latch mode    _5 Upgraded Electronics   _6 programming for accurate control _7 progressive Disease/changing condition _8 non-proportional drive control needed _9 Needed in order to operate power seat functions through joystick control   _10 Display box _11 Allows user to see in which mode and drive the wheelchair  is set  _12 necessary for alternate controls    _13 Digital interface electronics _14 Allows w/c to operate when using alternative drive controls  <JGGEZMOQHUTMLYYT>_0<\/PTWSFKCLEXNTZGYF>_74 ASL Head Array _16 Allows client to operate wheelchair  through switches placed in tri-panel headrest  _17 Sip and puff with tubing kit _18 needed to operate sip and puff drive controls  <BSWHQPRFFMBWGYKZ>_9<\/DJTTSVXBLTJQZESP>_23 Upgraded tracking electronics _20 increase safety when driving <RAQTMAUQJFHLKTGY>_5<\/WLSLHTDSKAJGOTLX>_72 correct tracking when on uneven surfaces  _22 [facility] for switches or joystick _23 Attaches switches to w/c  _24 Swing away for access or transfers _25 midline for optimal placement _26 provides for consistent access  _27 Attendant controlled joystick plus mount _28 safety _29 long distance driving <IOMBTDHRCBULAGTX>_6<\/IWOEHOZYYQMGNOIB>_70 operation of seat functions _31 compliance with transportation regulations _32  ?????    Rear wheel placement/Axle adjustability _33 None _34 semi adjustable _35 fully adjustable  _36 improved UE access to wheels _37 improved stability _38 changing angle in space for improvement of postural stability _39 1-arm drive access <WUGQBVQXIHWTUUEK>_8<\/MKLKJZPHXTAVWPVX>_48 amputee pad placement _41  ?????  Wheel rims/ hand rims  _42 metal  _43 plastic coated _44 oblique projections _45 vertical projections _46 Provide ability to propel manual wheelchair  _47  Increase self-propulsion with hand weakness/decreased grasp  Push handles _48 extended  _49 angle adjustable  _50 standard _51 caregiver access _52 caregiver assist _53 allows "hooking" to enable increased ability to perform ADLs or maintain balance  One armed device  _54 Lt   _55 Rt _56 enable propulsion of manual wheelchair with one arm   _57  ?????   Brake/wheel lock extension _58  Lt   _59  Rt _60 increase indep in applying wheel locks   _61 Side guards _62 prevent clothing getting caught in wheel or becoming soiled _63  prevent skin tears/abrasions  Battery: U1 x 2 _64 to power wheelchair ?????  Other: ????? ????? ?????  The above equipment has a life- long use expectancy. Growth and changes in medical and/or functional conditions would be the exceptions. This is to  certify that the therapist has no financial relationship with durable medical provider or manufacturer. The therapist will not receive remuneration of any kind for the equipment recommended in this evaluation.   Patient has mobility limitation that significantly impairs safe, timely participation in one or more mobility related ADL's.  (bathing, toileting, feeding, dressing, grooming, moving from room to room)                                                             _65  Yes _66  No Will mobility device sufficiently improve ability to participate and/or be aided in participation of MRADL's?         _67  Yes _68  No Can limitation be compensated for with use of a cane or walker?                                                                                _69   Yes _0  No Does patient or caregiver demonstrate ability/potential ability & willingness to safely use the mobility device?   _1  Yes _2  No Does patient's home environment support use of recommended mobility device?                                                    _3  Yes _4  No Does patient have sufficient upper extremity function necessary to functionally propel a manual wheelchair?    _5  Yes _6  No Does patient have sufficient strength and trunk stability to safely operate a POV (scooter)?                                  _7  Yes _8  No Does patient need additional features/benefits provided by a power wheelchair for MRADL's in the home?       _9  Yes _10  No Does the patient demonstrate the ability to safely use a power wheelchair?                                                              _11  Yes _12  No  Therapist Name Printed: ????? Date: ?????  Therapist's Signature:   Date:   Supplier's Name Printed: ????? Date: ?????  Supplier's Signature:   Date:  Patient/Caregiver Signature:   Date:     This is to certify that I have read this evaluation and do agree with the content within:    Physician's Name Printed: ?????  Physician's  Signature:  Date:     This is to certify that I, the above signed therapist have the following affiliations: _13  This DME provider _14  Manufacturer of recommended equipment _15  Patient's long term care facility _16  None of the above                                         G-Codes - Feb 07, 2016 1145    Functional Assessment Tool Used TUG 44.58 sec, gait velocity 0.68 ft/sec with cane with mod assist, reports falling at least every other day-power wheelchair appears to be appropriate   Functional Limitation Mobility: Walking and moving around   Mobility: Walking and Moving Around Current Status (G2542) At least 40 percent but less than 60 percent impaired, limited or restricted   Mobility: Walking and Moving Around Goal Status [phone]) At least 40 percent but less than 60 percent impaired, limited or restricted   Mobility: Walking and Moving Around Discharge Status [phone]) At least 40 percent but less than 60 percent impaired, limited or restricted      Problem List Patient Active Problem List   Diagnosis Date Noted  . Viral URI with cough 09/05/2015  . Fibromyalgia 08/18/2015  . Rheumatoid arthritis ([place]) 03/09/2015  . Essential hypertension, benign 02/18/2015  . Right knee pain 02/18/2015  . Cervical disc disorder with radiculopathy of cervical region 01/21/2015  . Bilateral arm pain 01/13/2015  . After cataract 07/22/2014  . Personal history of colonic polyp - adenoma 03/01/2014  .  Dyshidrotic eczema 12/29/2013  . Routine adult health maintenance 12/29/2013  . 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 Frequent falls 02/10/2013  . Generalized pain 08/30/2012  . STROKE 07/29/2008  . SEIZURE DISORDER 07/29/2008  . Migraine without aura 07/29/2007  . DEPENDENCE, BARB/SED, CONTINUOUS 06/24/2007  . HYPERLIPIDEMIA 12/19/2006  . OBESITY, NOS 12/19/2006  . DEPRESSION, MAJOR, RECURRENT 12/19/2006  . PANIC ATTACKS 12/19/2006  . 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 BORDERLINE PERSONALITY 12/19/2006  . COPD 12/19/2006  . REFLUX ESOPHAGITIS 12/19/2006    MARRIOTT,AMY W. 01/26/2016, 11:47 AM Frazier Butt., PT   [facility] [address] [place], [place], [address] Phone: [phone]   Fax:  [phone]  Name: [name] MRN: [id number] Date of Birth: 08-01-1951

## 2016-02-15 ENCOUNTER — Other Ambulatory Visit: Payer: Self-pay | Admitting: *Deleted

## 2016-02-15 DIAGNOSIS — F32A Depression, unspecified: Secondary | ICD-10-CM

## 2016-02-15 DIAGNOSIS — F329 Major depressive disorder, single episode, unspecified: Secondary | ICD-10-CM

## 2016-02-16 MED ORDER — CITALOPRAM HYDROBROMIDE 20 MG PO TABS: ORAL_TABLET | ORAL | Status: DC

## 2016-03-07 ENCOUNTER — Telehealth: Payer: Self-pay | Admitting: Nurse Practitioner

## 2016-03-07 ENCOUNTER — Other Ambulatory Visit: Payer: Self-pay | Admitting: *Deleted

## 2016-03-07 MED ORDER — PHENOBARBITAL 100 MG PO TABS: 100.0000 mg | ORAL_TABLET | Freq: Every day | ORAL | Status: DC

## 2016-03-07 NOTE — Telephone Encounter (Signed)
I called and spoke with [name].  She asked about the PBS dose.  Pt has been getting 97.2 mg po daily since last prescription.   I would relay to CM/NP and if change would call her back.

## 2016-03-07 NOTE — Telephone Encounter (Signed)
According to our records she has been on 100mg  for several years.

## 2016-03-07 NOTE — Telephone Encounter (Signed)
Received fax confirmation [phone].

## 2016-03-07 NOTE — Telephone Encounter (Signed)
Lovena Le with [facility] [place] called to get clarification on medication dosage PHENObarbital (LUMINAL) 100 MG tablet. Please call back

## 2016-03-07 NOTE — Telephone Encounter (Signed)
I called and spoke to [facility], pharmacist and relayed that pt should be on PBS 100mg  po daily. (and has been on this dose for several yrs).  She will get ready for pt.

## 2016-04-05 ENCOUNTER — Ambulatory Visit (INDEPENDENT_AMBULATORY_CARE_PROVIDER_SITE_OTHER): Payer: Medicare Other | Admitting: Family Medicine

## 2016-04-05 ENCOUNTER — Encounter: Payer: Self-pay | Admitting: Family Medicine

## 2016-04-05 VITALS — BP 139/83 | HR 73 | Temp 97.3°F | Ht 66.0 in | Wt 212.6 lb

## 2016-04-05 DIAGNOSIS — M0579 Rheumatoid arthritis with rheumatoid factor of multiple sites without organ or systems involvement: Secondary | ICD-10-CM

## 2016-04-05 DIAGNOSIS — I639 Cerebral infarction, unspecified: Secondary | ICD-10-CM | POA: Diagnosis not present

## 2016-04-05 NOTE — Progress Notes (Signed)
   Subjective:    Patient ID: [name], female    DOB: Apr 10, 1951, 65 y.o.   MRN: [id number]  HPI 65 y/o female presents for Medicare Face to Face visit for mobility/power wheelchair evalulation.   PMH: RA and Stroke  How the Wheelchair will aid the patient in the home. Wheelchair will help with ambulation about the home (specifically when performing the below ADL's). ADL's - patient needs assistance from daughter with dressing/bathing, needs help using the toilet IADL's - patient does not cook, father takes care of bills  Ambulatory Devices Can not use cane/walker due to weakness/pain from CVA/RA limits ability to use these devices  Manual Wheelchair Has used manual wheelchair in the past however unable to use due to lack of arm strength.  Power Operated Device Bathroom is not large enough to accomodate a power  scooter.   I have reviewed and agree with the PT evaluation.   Review of Systems See above.     Objective:   Physical Exam BP 139/83 mmHg  Pulse 73  Temp(Src) 97.3 F (36.3 C) (Oral)  Ht 5\' 6"  (1.676 m)  Wt 212 lb 9.6 oz (96.435 kg)  BMI 34.33 kg/m2  Neuro Exam: CN: 2-12 intact Strength: BUE 3-/5, BHip 3-/5 Cerebellar - limited by strength Unable to perform rhomberg  Exam Findings are consistent with PT documentation.       Assessment & Plan:  65 y/o female presents for power wheelchair evaluation.   I have independently evaluated the patient and have confirmed that she is a candidate for a power wheelchair. She is unable to use a ambulatory device/manual wheelchair/ for the reasons stated above. She would benefit from the power wheelchair as it would assist with her ADL's as reviewed above.  Dossie Arbour MD

## 2016-04-27 ENCOUNTER — Other Ambulatory Visit: Payer: Self-pay | Admitting: Family Medicine

## 2016-05-25 ENCOUNTER — Ambulatory Visit (INDEPENDENT_AMBULATORY_CARE_PROVIDER_SITE_OTHER): Payer: Medicare Other | Admitting: Internal Medicine

## 2016-05-25 ENCOUNTER — Other Ambulatory Visit: Payer: Self-pay | Admitting: Internal Medicine

## 2016-05-25 VITALS — BP 129/87 | HR 62 | Temp 98.3°F | Ht 66.0 in | Wt 209.0 lb

## 2016-05-25 DIAGNOSIS — Z13820 Encounter for screening for osteoporosis: Secondary | ICD-10-CM

## 2016-05-25 DIAGNOSIS — M545 Low back pain, unspecified: Secondary | ICD-10-CM

## 2016-05-25 DIAGNOSIS — I639 Cerebral infarction, unspecified: Secondary | ICD-10-CM | POA: Diagnosis not present

## 2016-05-25 DIAGNOSIS — M25552 Pain in left hip: Secondary | ICD-10-CM | POA: Diagnosis not present

## 2016-05-25 MED ORDER — BACLOFEN 10 MG PO TABS: 10.0000 mg | ORAL_TABLET | Freq: Three times a day (TID) | ORAL | 0 refills | Status: DC

## 2016-05-25 NOTE — Progress Notes (Signed)
[facility] Progress Note  Subjective:  Starlyn Gabourel is a 64-y/o female who presents for 2 month history of pain in her left side.  Left-sided back pain: - No acute injury or recent falls - History of chronic low back pain with last lumbar imaging x-ray 2013 showing mild lumbar spondylosis - Describes pain as constant and sharp and worsened by movement (rotation) - Does not radiate down leg but does also have left hip pain - Describes an associated tightness in her left lower back - Walking has been more difficult due to pain but denies weakness; using cane - Has received power wheelchair but has not mastered using it around the house, as controls are very responsive - Taking gabapentin 300 mg BID but this is for seizure disorder - Has not been on prednisone for over a year (RA); does take methotrexate - Is treated at a pain clinic from which she receives norco; has tried a TENS unit without improvement ROS: No bowel or bladder incontinence, no radiculopathy, no recent rashes  Social: Former smoker  Objective: Blood pressure 129/87, pulse 62, temperature 98.3 F (36.8 C), temperature source Oral, height 5\' 6"  (1.676 m), weight 209 lb (94.8 kg). Constitutional: Obese female, pleasant, in discomfort  Cardiovascular: RRR, S1, S2, no m/r/g.  Pulmonary/Chest: Effort normal and breath sounds normal. No respiratory distress.  Abdominal: Soft. +BS, soft, NT, ND, no rebound or guarding.  Musculoskeletal: Midline TTP over thoracic and lumbar spine. TTP over left SI joint and over left greater trochanter. FABER test elicits pain in lower back bilaterally. Strength 4/5 of L LE due to discomfort.  Neuro: Peripheral sensation intact.  Skin: Skin is warm and dry. No rash or bruising.  Vitals reviewed  Assessment/Plan: Lower back pain - Patient with acute worsening of back pain. Concern for worsening arthritis or compression fractures given history of RA and steroid use, as  well as point tenderness on exam. Symptoms do not sound neuropathic, with no radiculopathy or burning sensation. - Ordered x-rays of thoracic and lumbar spine. - Ordered DEXA scan - Prescribed baclofen for muscle spasms (had GI upset with flexeril in the past) - Obtain CBC at next visit as rough screen for cancer (has not had weight loss)  Left hip pain - May be osteoarthritis in setting of obesity. Able to ambulate, so chronic fracture unlikely. - Obtain xrays of hip and pelvis.   Follow-up after imaging performed.  Olene Floss, MD [place], PGY-2

## 2016-05-27 ENCOUNTER — Encounter: Payer: Self-pay | Admitting: Internal Medicine

## 2016-05-27 DIAGNOSIS — M545 Low back pain, unspecified: Secondary | ICD-10-CM | POA: Insufficient documentation

## 2016-05-27 DIAGNOSIS — M25552 Pain in left hip: Secondary | ICD-10-CM | POA: Insufficient documentation

## 2016-05-27 NOTE — Assessment & Plan Note (Signed)
-   May be osteoarthritis in setting of obesity. Able to ambulate, so chronic fracture unlikely. - Obtain xrays of hip and pelvis.

## 2016-05-27 NOTE — Assessment & Plan Note (Addendum)
-   Patient with acute worsening of back pain. Concern for worsening arthritis or compression fractures given history of RA and steroid use, as well as point tenderness on exam. Symptoms do not sound neuropathic, with no radiculopathy or burning sensation. - Ordered x-rays of thoracic and lumbar spine. - Ordered DEXA scan - Prescribed baclofen for muscle spasms (had GI upset with flexeril in the past) - Obtain CBC at next visit as rough screen for cancer (has not had weight loss)

## 2016-05-27 NOTE — Patient Instructions (Signed)
Please have xrays performed. You will receive a call about DEXA scan scheduling. Please seek emergency care if you have increased falls, loss of bowel or bladder control, or worsening pain.

## 2016-05-31 ENCOUNTER — Ambulatory Visit
Admission: RE | Admit: 2016-05-31 | Discharge: 2016-05-31 | Disposition: A | Discharge status: Home / Self Care | Payer: Medicare Other | Source: Ambulatory Visit | Attending: Family Medicine | Admitting: Family Medicine

## 2016-05-31 ENCOUNTER — Other Ambulatory Visit: Payer: Self-pay | Admitting: Internal Medicine

## 2016-05-31 DIAGNOSIS — M545 Low back pain: Secondary | ICD-10-CM

## 2016-05-31 DIAGNOSIS — M25552 Pain in left hip: Secondary | ICD-10-CM

## 2016-06-04 ENCOUNTER — Telehealth: Payer: Self-pay | Admitting: Internal Medicine

## 2016-06-04 DIAGNOSIS — M5136 Other intervertebral disc degeneration, lumbar region: Secondary | ICD-10-CM

## 2016-06-04 MED ORDER — DIAZEPAM 5 MG PO TABS: 5.0000 mg | ORAL_TABLET | Freq: Every evening | ORAL | 0 refills | Status: DC | PRN

## 2016-06-04 NOTE — Telephone Encounter (Signed)
Called patient to discuss x-ray results. Informed her of mild degenerative disc disease seen on x-ray. Recommended physical therapy, but patient expressed concern since she is having such decreased mobility at this time. She reported that baclofen made her sick to her stomach like flexeril and that valium had worked for her in the past. Informed her that valium was not a good long-term medication choice due to increased risk of sedation/falls. Advised her I would give a limited prescription in hope it would bring enough improvement in discomfort she would reconsider physical therapy. Asked her to follow-up within a month to perform labs to check CBC and SCr. Will recommend NSAIDs other than ibuprofen (hives) at that time.

## 2016-07-30 ENCOUNTER — Other Ambulatory Visit: Payer: Self-pay | Admitting: *Deleted

## 2016-07-30 MED ORDER — TRIAMCINOLONE ACETONIDE 0.5 % EX OINT: TOPICAL_OINTMENT | CUTANEOUS | 2 refills | Status: DC

## 2016-08-07 ENCOUNTER — Other Ambulatory Visit: Payer: Self-pay | Admitting: *Deleted

## 2016-08-07 MED ORDER — PHENOBARBITAL 100 MG PO TABS
100.0000 mg | ORAL_TABLET | Freq: Every day | ORAL | 0 refills | Status: DC
Start: 2016-08-07 — End: 2016-08-29

## 2016-08-07 NOTE — Telephone Encounter (Signed)
I called pt and asked if she had enough to get her by till her appt 08-29-16 at 1600 and she stated she just filled it, but would appreciate having one refill available, just in case.

## 2016-08-07 NOTE — Telephone Encounter (Signed)
Fax confirmation received.  [facility]  [phone].

## 2016-08-29 ENCOUNTER — Encounter: Payer: Self-pay | Admitting: Nurse Practitioner

## 2016-08-29 ENCOUNTER — Ambulatory Visit (INDEPENDENT_AMBULATORY_CARE_PROVIDER_SITE_OTHER): Payer: Medicare Other | Admitting: Nurse Practitioner

## 2016-08-29 VITALS — BP 121/80 | HR 60 | Ht 66.0 in | Wt 215.8 lb

## 2016-08-29 DIAGNOSIS — G40309 Generalized idiopathic epilepsy and epileptic syndromes, not intractable, without status epilepticus: Secondary | ICD-10-CM | POA: Diagnosis not present

## 2016-08-29 DIAGNOSIS — G43009 Migraine without aura, not intractable, without status migrainosus: Secondary | ICD-10-CM

## 2016-08-29 MED ORDER — TOPIRAMATE 50 MG PO TABS: 50.0000 mg | ORAL_TABLET | Freq: Two times a day (BID) | ORAL | 6 refills | Status: DC

## 2016-08-29 MED ORDER — PHENOBARBITAL 100 MG PO TABS: 100.0000 mg | ORAL_TABLET | Freq: Every day | ORAL | 5 refills | Status: DC

## 2016-08-29 NOTE — Progress Notes (Signed)
[facility]  PATIENT: [name] DOB: 07-13-1951   REASON FOR VISIT: Follow-up for epilepsy, chronic headaches HISTORY FROM: Patient    HISTORY OF PRESENT ILLNESS:[name], 65 year old female returns for followup. She has a history of seizure disorder as well as headaches. Last seizure occurred June 2013. She is currently on phenobarbital. 100mg  at night. She is also on Topamax 50 twice daily headaches. She continues to have left foot pain from previous surgery . Headaches initially improved with the addition of Topamax, headaches are bitemporal and at vertex of the head. She may have nausea and vomiting with headache. She has had more headaches in the last several months. She wakes with morning headaches. She no longer drives a car. She also complains of joint pain in the knees shoulders and neck. She was recently diagnosed with fibromyalgia and rheumatoid arthritis. She sees a rheumatologist in [facility].  She does not exercise.  She has a history of multiple falls. She returns for reevaluation   HISTORY: of seizure events and a left hemisensory deficit. The patient indicates that she has gone 15 years without a seizure, and she suffered a seizure event in June 2013. The patient has been on phenobarbital, and she indicates that she has not missed a dose of the medication. In 2007, the patient had a MRI the brain that was normal. The patient indicates that over the last year, she began having headaches that are bitemporal and at the vertex of the head, associated with a throbbing pain. The headaches have gotten better with the addition of Topamax. The patient may have nausea and vomiting with the headache. The patient indicates that she does not drive a car. The patient continues to have left-sided numbness, and she feels somewhat weak on the left side. The patient has her left leg in a cast. She is ambulating with a cane. The patient has significant degenerative  arthritis of the knees, and she has low back pain. 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 EEG was normal.     REVIEW OF SYSTEMS: Full 14 system review of systems performed and notable only for those listed, all others are neg:  Constitutional: neg  Cardiovascular: neg Ear/Nose/Throat: neg  Skin: neg Eyes: Blurred vision Respiratory: Cough Gastroitestinal: neg  Hematology/Lymphatic: neg  Endocrine: neg Musculoskeletal: Pain in the knees, back Allergy/Immunology: neg Neurological: Seizure disorder, history of migraines, occasional dizziness Psychiatric: neg Sleep : neg   ALLERGIES: Allergies  Allergen Reactions  . Amoxicillin     REACTION: hives  . Aspirin     REACTION: rash  . Codeine Phosphate     REACTION: N/V  . Flexeril [Cyclobenzaprine] Nausea And Vomiting    Dizziness   . Ibuprofen Hives  . Penicillins     REACTION: hives  . Prednisone     REACTION: pshycosis  . Tetanus Toxoids Swelling    HOME MEDICATIONS: Outpatient Medications Prior to Visit  Medication Sig Dispense Refill  . carvedilol (COREG) 3.125 MG tablet TAKE 1 TABLET BY MOUTH TWICE A DAY WITH A MEAL 62 tablet 5  . citalopram (CELEXA) 20 MG tablet TAKE 1 TABLET BY MOUTH ONCE A DAY 30 tablet 11  . folic acid (FOLVITE) 1 MG tablet Take 1 mg by mouth.    . gabapentin (NEURONTIN) 300 MG capsule TAKE 3 CAPSULES BY MOUTH TWICE A DAY 180 capsule 5  . HYDROcodone-acetaminophen (NORCO/VICODIN) 5-325 MG tablet Take 1 tablet by mouth 3 (three) times daily  as needed for moderate pain. 90 tablet 0  . Ipratropium-Albuterol (COMBIVENT RESPIMAT) 20-100 MCG/ACT AERS respimat Inhale 1 puff into the lungs every 6 (six) hours.    Marland Kitchen nystatin (MYCOSTATIN/NYSTOP) 100000 UNIT/GM POWD One application to breast 3 times daily. 60 g 0  . omeprazole (PRILOSEC) 20 MG capsule TAKE 1 CAPSULE BY MOUTH ONCE DAILY 30 capsule 11  . PHENObarbital (LUMINAL) 100 MG tablet Take 1 tablet (100 mg total)  by mouth daily. 30 tablet 0  . topiramate (TOPAMAX) 50 MG tablet Take 1 tablet (50 mg total) by mouth 2 (two) times daily. 60 tablet 11  . triamcinolone ointment (KENALOG) 0.5 % APPLY TO SKIN TWICE DAILY AS DIRECTED 60 g 2  . promethazine (PHENERGAN) 12.5 MG tablet Take 1 tablet (12.5 mg total) by mouth every 8 (eight) hours as needed for nausea. 60 tablet 0  . benzonatate (TESSALON) 200 MG capsule Take 1 capsule (200 mg total) by mouth 2 (two) times daily as needed for cough. (Patient not taking: Reported on 08/29/2016) 20 capsule 0  . diazepam (VALIUM) 5 MG tablet Take 1 tablet (5 mg total) by mouth at bedtime as needed for muscle spasms. (Patient not taking: Reported on 08/29/2016) 30 tablet 0  . methotrexate (RHEUMATREX) 2.5 MG tablet Take 8 tabs by mouth weekly     No facility-administered medications prior to visit.     PAST MEDICAL HISTORY: Past Medical History:  Diagnosis Date  . Chronic low back pain   . 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  . COPD (chronic obstructive pulmonary disease) ([place])   . Depression   . Fibromyalgia    08/18/15  [facility]  . GERD (gastroesophageal reflux disease)   . History of CVA (cerebrovascular accident)    "23 minni srokes"  . Hyperlipidemia   . Hypertension   . Migraine   . Personal history of colonic polyp - adenoma 03/01/2014  . Personality disorder   . Rheumatoid arthritis ([place])    08/18/15  [facility]  . Seizures ([place])    last seizure 4 years ago  . Sleep apnea    Mild. No need for CPAP.  Marland Kitchen Stroke [facility])     PAST SURGICAL HISTORY: Past Surgical History:  Procedure Laterality Date  . ANKLE SURGERY Left   . APPENDECTOMY    . BACK SURGERY    . BUNIONECTOMY     2 toes  . CARDIAC CATHETERIZATION  2008   Normal. Dr Acie Fredrickson.  . CHOLECYSTECTOMY    . CYST EXCISION     leg  . FLANK MASS EXCISION    . FOOT TENDON SURGERY  june 2014   both feet  . KNEE SURGERY     Bilateral.  . NOSE SURGERY    . SHOULDER  SURGERY     bil.  Marland Kitchen TOTAL ABDOMINAL HYSTERECTOMY      FAMILY HISTORY: Family History  Problem Relation Age of Onset  . Cirrhosis Mother   . Diabetes Mother   . Cirrhosis Father   . Breast cancer Sister   . Breast cancer Sister   . Colon cancer Neg Hx     SOCIAL HISTORY: Social History   Social History  . Marital status: Married    Spouse name: Clearence   . Number of children: 1  . Years of education: 47   Occupational History  . Unemployed     Social History Main Topics  . Smoking status: Former Research scientist (life sciences)  . Smokeless tobacco: Former Systems developer  Quit date: 11/03/2010  . Alcohol use No  . Drug use: No  . Sexual activity: Not on file   Other Topics Concern  . Not on file   Social History Narrative   Patient lives at home with her husband [name].    Patient is currently not working.    Patient has 1 child.    Patient has a 11th grade education.      PHYSICAL EXAM  Vitals:   08/29/16 1406  BP: 121/80  Pulse: 60  Weight: 215 lb 12.8 oz (97.9 kg)  Height: 5\' 6"  (1.676 m)   Body mass index is 34.83 kg/m. Generalized: Well developed, moderately obese female in no acute distress  Head: normocephalic and atraumatic,. Oropharynx benign  Neck: Supple, no carotid bruits  Cardiac: Regular rate rhythm, no murmur  Neurological examination  Mentation: Alert oriented to time, place, history taking. Follows all commands speech and language fluent ESS 5, FSS 25.  Cranial nerve II-XII: Pupils were equal round reactive to light extraocular movements were full, visual field were full on confrontational test. Facial sensation and strength were normal. hearing was intact to finger rubbing bilaterally. Uvula tongue midline. head turning and shoulder shrug were normal and symmetric.Tongue protrusion into cheek strength was normal.  Motor: normal bulk and tone, full strength in the BUE, BLE,   Sensory: normal and symmetric to light touch, pinprick, and vibration In the upper  and lower extremities Coordination: finger-nose-finger, heel-to-shin bilaterally, no dysmetria  Reflexes: Depressed bilaterally upper and lower  Gait and Station: Rising up from seated position without assistance, ambulating short distances in the hall, no difficulty with turns, unsteady with tandem. Romberg negative. No assistive device  DIAGNOSTIC DATA (LABS, IMAGING, TESTING) -     ASSESSMENT AND PLAN  65 y.o. year old female  has a past medical history of Hypertension; Hyperlipidemia; Conversion disorder with seizures or convulsions; COPD (chronic obstructive pulmonary disease) ([place]); Depression; Personality disorder; Chronic low back pain;  Migraine; Stroke ([place]); History of CVA (cerebrovascular accident); Seizures ([place]);  Fibromyalgia; and Rheumatoid arthritis ([place]). here to follow-up for seizure disorder and migraines which are well controlled  PLANContinue Topamax but increase dose to 1 in the am and 2 in the pm will refill Keep record of headaches Continue phenobarbital at current dose will refill Will check phenobarbital level Reviewed recent CBC and CMP from 07/19/16 Followup in 6 months Dennie Bible, [facility], [facility], [facility] [address] [place], [place] [address] [phone]

## 2016-08-29 NOTE — Patient Instructions (Addendum)
Continue Topamax but increase dose to 1 in the am and 2 in the pm will refill Keep record of headaches Continue phenobarbital at current dose will refill Will check phenobarbital level Reviewed recent CBC and CMP from 07/19/16 Followup in 6 months

## 2016-08-29 NOTE — Progress Notes (Signed)
Fax confirmation received Phenobarbital [phone].

## 2016-08-29 NOTE — Progress Notes (Signed)
I have read the note, and I agree with the clinical assessment and plan.  Shaelynn Dragos KEITH   

## 2016-08-30 ENCOUNTER — Telehealth: Payer: Self-pay | Admitting: *Deleted

## 2016-08-30 LAB — PHENOBARBITAL LEVEL: Phenobarbital, Serum: 21 ug/mL (ref 15–40)

## 2016-08-30 NOTE — Telephone Encounter (Signed)
Spoke to pt and let her know that her labs were good.  (Phenobarb. Level).  She verbalized understanding.

## 2016-08-30 NOTE — Telephone Encounter (Signed)
-----   Message from Dennie Bible, NP sent at 08/30/2016  8:20 AM EST ----- Labs look good. Please call patient

## 2016-10-26 ENCOUNTER — Ambulatory Visit (INDEPENDENT_AMBULATORY_CARE_PROVIDER_SITE_OTHER): Payer: Medicare Other | Admitting: Family Medicine

## 2016-10-26 ENCOUNTER — Encounter: Payer: Self-pay | Admitting: Family Medicine

## 2016-10-26 VITALS — BP 130/90 | HR 63 | Temp 97.9°F | Wt 213.0 lb

## 2016-10-26 DIAGNOSIS — M542 Cervicalgia: Secondary | ICD-10-CM | POA: Diagnosis not present

## 2016-10-26 DIAGNOSIS — M62838 Other muscle spasm: Secondary | ICD-10-CM | POA: Diagnosis not present

## 2016-10-26 MED ORDER — PREDNISONE 10 MG PO TABS: 10.0000 mg | ORAL_TABLET | Freq: Every day | ORAL | 0 refills | Status: DC

## 2016-10-26 MED ORDER — IPRATROPIUM-ALBUTEROL 20-100 MCG/ACT IN AERS: 1.0000 | INHALATION_SPRAY | Freq: Four times a day (QID) | RESPIRATORY_TRACT | 0 refills | Status: DC

## 2016-10-26 MED ORDER — BACLOFEN 10 MG PO TABS: 10.0000 mg | ORAL_TABLET | Freq: Three times a day (TID) | ORAL | 0 refills | Status: DC | PRN

## 2016-10-26 NOTE — Patient Instructions (Signed)
It was a pleasure seeing you today in our clinic. Today we discussed your neck pain. Here is the treatment plan we have discussed and agreed upon together:   - I've ordered to obtain x-rays of your neck. Use a paper provided for directions to the radiology center. - Use ice or heat to help with this pain. - Slow stretching may help. Use pain as a guide and did not overdo for overstretch. - Follow-up with either myself or your primary care provider in 1-2 weeks

## 2016-10-26 NOTE — Progress Notes (Signed)
   HPI  CC: neck pain Patient is here with complaints of neck pain. She states that it started approximately 2 weeks ago. She denies any trauma or fall that maybe caused the onset of this pain. Pain is located along the bilateral base of the neck and seems to radiate up the occiput towards the top of the head. She has significant limitations in her range of motion and she says that her pain has not gotten any better since it started. She denies any fevers or chills. She endorses a slight headache. No radiation to her extremities. She denies any weakness, numbness, or paresthesias. No vision changes. No photosensitivity. No phonosensitivity. No dizziness, vertigo, or lightheadedness.  Review of Systems    See HPI for ROS. All other systems reviewed and are negative.  CC, SH/smoking status, and VS noted  Objective: BP 130/90   Pulse 63   Temp 97.9 F (36.6 C) (Oral)   Wt 213 lb (96.6 kg)   BMI 34.38 kg/m  Gen: NAD, alert, cooperative. Well-appearing but overall very uncomfortable. CV: Well-perfused. Resp: Non-labored. Neuro: Sensation intact throughout. MSK: ROM limited in all planes of the neck. No evidence of rash, ecchymoses, erythema, or bony deformity. TTP greatest along the paraspinal muscles (bilaterally) at C2 and C5 with no significant evidence of specific tenderness on the spinous processes. Paraspinal muscle tightness noted along the C-spine. Strength and sensation intact throughout all 4 extremities.   Assessment and plan:  Cervical paraspinal muscle spasm Patient is here with complaints of bilateral neck pain. Symptoms and exam consistent with paraspinal muscle spasms and pain. Patient had significant limitations in ROM however there are no other red flag symptoms present (peripheral radiation of symptoms, CNS symptoms, fever, occular symptoms). - Baclofen 3 times a day when necessary provided for muscle spasm. - Prednisone 10 mg 5 days for inflammation (would've preferred  NSAID but patient has allergy to ibuprofen) - C-spine plain x-ray - Ice or heat as tolerated - Gentle ROM activities to help with spasm tightness - Return options discussed - Patient has been asked to return in 1-2 weeks if symptoms persist.  Next: At this time no indication for emergent/urgent CT of the C-spine deemed necessary. However, if symptoms worsen/persist, or fever/peripheral symptoms develop then consideration for CT may be more appropriate at that time. Also, could consider ESR/CRP if symptoms persist.   Orders Placed This Encounter  Procedures  . DG Cervical Spine Complete    Standing Status:   Future    Standing Expiration Date:   12/24/2017    Order Specific Question:   Reason for Exam (SYMPTOM  OR DIAGNOSIS REQUIRED)    Answer:   neck pain    Order Specific Question:   Preferred imaging location?    Answer:   [facility]    Meds ordered this encounter  Medications  . baclofen (LIORESAL) 10 MG tablet    Sig: Take 1 tablet (10 mg total) by mouth 3 (three) times daily as needed for muscle spasms.    Dispense:  30 each    Refill:  0  . predniSONE (DELTASONE) 10 MG tablet    Sig: Take 1 tablet (10 mg total) by mouth daily with breakfast.    Dispense:  5 tablet    Refill:  0     Elberta Leatherwood, MD,MS,  PGY3 10/26/2016 6:12 PM

## 2016-10-26 NOTE — Assessment & Plan Note (Addendum)
Patient is here with complaints of bilateral neck pain. Symptoms and exam consistent with paraspinal muscle spasms and pain. Patient had significant limitations in ROM however there are no other red flag symptoms present (peripheral radiation of symptoms, CNS symptoms, fever, occular symptoms). - Baclofen 3 times a day when necessary provided for muscle spasm. - Prednisone 10 mg 5 days for inflammation (would've preferred NSAID but patient has allergy to ibuprofen) - C-spine plain x-ray - Ice or heat as tolerated - Gentle ROM activities to help with spasm tightness - Return options discussed - Patient has been asked to return in 1-2 weeks if symptoms persist.  Next: At this time no indication for emergent/urgent CT of the C-spine deemed necessary. However, if symptoms worsen/persist, or fever/peripheral symptoms develop then consideration for CT may be more appropriate at that time. Also, could consider ESR/CRP if symptoms persist.

## 2016-10-29 ENCOUNTER — Ambulatory Visit
Admission: RE | Admit: 2016-10-29 | Discharge: 2016-10-29 | Disposition: A | Discharge status: Home / Self Care | Payer: Medicare Other | Source: Ambulatory Visit | Attending: Family Medicine | Admitting: Family Medicine

## 2016-10-29 DIAGNOSIS — M542 Cervicalgia: Secondary | ICD-10-CM

## 2016-11-01 ENCOUNTER — Telehealth: Payer: Self-pay | Admitting: *Deleted

## 2016-11-01 NOTE — Telephone Encounter (Signed)
Prior Authorization received from [facility] for Combivent Respimat. Formulary and PA form placed in provider box for completion. Derl Barrow, RN

## 2016-11-02 NOTE — Telephone Encounter (Signed)
I'll be on the lookout for the form. Thank you.

## 2016-11-02 NOTE — Telephone Encounter (Signed)
Patient just asked for a refill at the last visit. She is not my patient. Please forward paperwork to PCP

## 2016-11-06 NOTE — Telephone Encounter (Signed)
PA form placed in provider box for review.  Martin, Tamika L, RN  

## 2016-11-06 NOTE — Telephone Encounter (Signed)
Needs refill on gabapentin and  Carvedilol.  [place]

## 2016-11-06 NOTE — Telephone Encounter (Signed)
Pt needs refills on gabapentin and carevedilol. Pt also really needs Combivent, daughter states pt is not doing well without it. ep

## 2016-11-07 ENCOUNTER — Other Ambulatory Visit: Payer: Self-pay | Admitting: Internal Medicine

## 2016-11-07 DIAGNOSIS — J449 Chronic obstructive pulmonary disease, unspecified: Secondary | ICD-10-CM

## 2016-11-07 DIAGNOSIS — I10 Essential (primary) hypertension: Secondary | ICD-10-CM

## 2016-11-07 DIAGNOSIS — G8929 Other chronic pain: Secondary | ICD-10-CM

## 2016-11-07 DIAGNOSIS — J441 Chronic obstructive pulmonary disease with (acute) exacerbation: Secondary | ICD-10-CM

## 2016-11-07 MED ORDER — CARVEDILOL 3.125 MG PO TABS: ORAL_TABLET | ORAL | 5 refills | Status: DC

## 2016-11-07 MED ORDER — GABAPENTIN 300 MG PO CAPS: ORAL_CAPSULE | ORAL | 5 refills | Status: DC

## 2016-11-07 MED ORDER — TIOTROPIUM BROMIDE MONOHYDRATE 18 MCG IN CAPS: 18.0000 ug | ORAL_CAPSULE | Freq: Every day | RESPIRATORY_TRACT | 1 refills | Status: DC

## 2016-11-07 MED ORDER — ALBUTEROL SULFATE HFA 108 (90 BASE) MCG/ACT IN AERS: 2.0000 | INHALATION_SPRAY | Freq: Four times a day (QID) | RESPIRATORY_TRACT | 2 refills | Status: DC | PRN

## 2016-11-07 NOTE — Telephone Encounter (Signed)
Reordered gabapentin and carvedilol. Received PA form for combivent 11/06/16 when off-site. Will complete as soon as clinic is open again. Thank you.

## 2016-11-07 NOTE — Telephone Encounter (Signed)
Called patient to discuss COPD medications. Will have her try spiriva and albuterol as substitution for combivent. Patient expressed understanding.   Olene Floss, MD [place], PGY-2

## 2016-11-09 ENCOUNTER — Other Ambulatory Visit: Payer: Self-pay | Admitting: Internal Medicine

## 2016-11-09 NOTE — Telephone Encounter (Signed)
Called patient to see if she would like to try alternative medication specified by insurance for COPD. She said she had already picked up albuterol and spiriva prescriptions but would call if these do not control symptoms.  Olene Floss, MD [place], PGY-2

## 2016-11-12 NOTE — Telephone Encounter (Signed)
Provided alternative for combivent. Patient to trial.

## 2016-11-26 ENCOUNTER — Telehealth: Payer: Self-pay | Admitting: Internal Medicine

## 2016-11-26 ENCOUNTER — Telehealth: Payer: Self-pay

## 2016-11-26 DIAGNOSIS — M542 Cervicalgia: Secondary | ICD-10-CM

## 2016-11-26 NOTE — Telephone Encounter (Signed)
Placed requested referral to Rheumatology.

## 2016-11-26 NOTE — Telephone Encounter (Signed)
Placed referral  

## 2016-11-26 NOTE — Telephone Encounter (Signed)
[name], [name] is seeing pt for Neurology and Rheumatology needs referral for 3 visits for evaluation. Please call [phone] when this is done. Seeing pt tomorrow and needs this ASAP. Ottis Stain, CMA

## 2016-11-26 NOTE — Telephone Encounter (Signed)
Attempted to return call to [facility] and [place]. Will authorize visits when she calls back.

## 2017-01-01 ENCOUNTER — Other Ambulatory Visit: Payer: Self-pay | Admitting: Internal Medicine

## 2017-02-11 ENCOUNTER — Other Ambulatory Visit: Payer: Self-pay | Admitting: *Deleted

## 2017-02-11 DIAGNOSIS — F32A Depression, unspecified: Secondary | ICD-10-CM

## 2017-02-11 DIAGNOSIS — F329 Major depressive disorder, single episode, unspecified: Secondary | ICD-10-CM

## 2017-02-11 MED ORDER — CITALOPRAM HYDROBROMIDE 20 MG PO TABS: ORAL_TABLET | ORAL | 3 refills | Status: DC

## 2017-02-20 ENCOUNTER — Encounter: Payer: Self-pay | Admitting: Family Medicine

## 2017-02-20 ENCOUNTER — Other Ambulatory Visit: Payer: Self-pay | Admitting: Family Medicine

## 2017-02-20 ENCOUNTER — Other Ambulatory Visit: Payer: Self-pay | Admitting: Internal Medicine

## 2017-02-20 ENCOUNTER — Ambulatory Visit (INDEPENDENT_AMBULATORY_CARE_PROVIDER_SITE_OTHER): Payer: Medicare Other | Admitting: Family Medicine

## 2017-02-20 DIAGNOSIS — B372 Candidiasis of skin and nail: Secondary | ICD-10-CM | POA: Diagnosis not present

## 2017-02-20 MED ORDER — CLOTRIMAZOLE 1 % EX CREA: 1.0000 "application " | TOPICAL_CREAM | Freq: Two times a day (BID) | CUTANEOUS | 0 refills | Status: DC

## 2017-02-20 NOTE — Progress Notes (Signed)
Subjective: CC: rash HPI: Patient is a 66 y.o. female with a past medical history of dyshidrotic eczema presenting to clinic today for a SDA for rash.  RASH  Had rash for 2 days (however it seems it has been intermittent over the last few months) Location: under breasts Medications tried:  Calamine lotion, nystatin powder (last used 1 week ago; used it without improvement)  Similar rash in past: yes, was previously responsive to nystatin powder New medications or antibiotics: no Tick, Insect or new pet exposure: no Recent travel: no New detergent or soap: no Immunocompromised: no  Symptoms Itching: yes Pain over rash: yes, it stings intermittently Feeling ill all over: no Fever: no Mouth sores: no Face or tongue swelling: no Trouble breathing: no Joint swelling or pain: no Pt does not wear bras but does note significant sweating at night.   Social History: former smoker  ROS: All other systems reviewed and are negative.  Past Medical History Patient Active Problem List   Diagnosis Date Noted  . Cervical paraspinal muscle spasm 10/26/2016  . Lower back pain 05/27/2016  . Left hip pain 05/27/2016  . Fibromyalgia 08/18/2015  . Rheumatoid arthritis ([place]) 03/09/2015  . Essential hypertension, benign 02/18/2015  . Right knee pain 02/18/2015  . Cervical disc disorder with radiculopathy of cervical region 01/21/2015  . Bilateral arm pain 01/13/2015  . After cataract 07/22/2014  . Personal history of colonic polyp - adenoma 03/01/2014  . Candidal intertrigo 12/29/2013  . Dyshidrotic eczema 12/29/2013  . Routine adult health maintenance 12/29/2013  . 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 Frequent falls 02/10/2013  . Generalized pain 08/30/2012  . STROKE 07/29/2008  . SEIZURE DISORDER 07/29/2008  . Migraine without aura 07/29/2007  . DEPENDENCE, BARB/SED, CONTINUOUS 06/24/2007  . HYPERLIPIDEMIA 12/19/2006  . OBESITY, NOS 12/19/2006  . DEPRESSION,  MAJOR, RECURRENT 12/19/2006  . PANIC ATTACKS 12/19/2006  . 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 BORDERLINE PERSONALITY 12/19/2006  . COPD 12/19/2006  . REFLUX ESOPHAGITIS 12/19/2006    Medications- reviewed and updated Current Outpatient Prescriptions  Medication Sig Dispense Refill  . albuterol (PROVENTIL HFA;VENTOLIN HFA) 108 (90 Base) MCG/ACT inhaler Inhale 2 puffs into the lungs every 6 (six) hours as needed for wheezing or shortness of breath. 1 Inhaler 2  . alendronate (FOSAMAX) 70 MG tablet Take 70 mg by mouth once a week.     . baclofen (LIORESAL) 10 MG tablet Take 1 tablet (10 mg total) by mouth 3 (three) times daily as needed for muscle spasms. 30 each 0  . carvedilol (COREG) 3.125 MG tablet TAKE 1 TABLET BY MOUTH TWICE A DAY WITH A MEAL 62 tablet 5  . citalopram (CELEXA) 20 MG tablet TAKE 1 TABLET BY MOUTH ONCE A DAY 30 tablet 3  . folic acid (FOLVITE) 1 MG tablet Take 1 mg by mouth.    . gabapentin (NEURONTIN) 300 MG capsule TAKE 3 CAPSULES BY MOUTH TWICE A DAY 180 capsule 5  . HYDROcodone-acetaminophen (NORCO/VICODIN) 5-325 MG tablet Take 1 tablet by mouth 3 (three) times daily as needed for moderate pain. 90 tablet 0  . methotrexate 50 MG/2ML injection     . omeprazole (PRILOSEC) 20 MG capsule TAKE 1 CAPSULE BY MOUTH ONCE DAILY 30 capsule 11  . PHENObarbital (LUMINAL) 100 MG tablet Take 1 tablet (100 mg total) by mouth daily. 30 tablet 5  . tiotropium (SPIRIVA) 18 MCG inhalation capsule Place 1 capsule (18 mcg total) into inhaler and inhale daily. 30 capsule 1  .  topiramate (TOPAMAX) 50 MG tablet Take 1 tablet (50 mg total) by mouth 2 (two) times daily. 1 in the am and 2 at bedtime 90 tablet 6  . triamcinolone ointment (KENALOG) 0.5 % APPLY TO SKIN TWICE DAILY AS DIRECTED 60 g 0  . clotrimazole (LOTRIMIN) 1 % cream Apply 1 application topically 2 (two) times daily. Under breasts 30 g 0  . ENBREL SURECLICK 50 MG/ML injection     . Ipratropium-Albuterol (COMBIVENT RESPIMAT) 20-100  MCG/ACT AERS respimat Inhale 1 puff into the lungs every 6 (six) hours. (Patient not taking: Reported on 02/20/2017) 1 Inhaler 0  . predniSONE (DELTASONE) 10 MG tablet Take 1 tablet (10 mg total) by mouth daily with breakfast. (Patient not taking: Reported on 02/20/2017) 5 tablet 0  . promethazine (PHENERGAN) 12.5 MG tablet Take 1 tablet (12.5 mg total) by mouth every 8 (eight) hours as needed for nausea. 60 tablet 0   No current facility-administered medications for this visit.     Objective: Office vital signs reviewed. BP 128/80 (BP Location: Left Arm, Patient Position: Sitting, Cuff Size: Large)   Pulse 73   Temp 97.8 F (36.6 C) (Oral)   Resp 16   Ht 5\' 6"  (1.676 m)   Wt 212 lb (96.2 kg)   SpO2 96%   BMI 34.22 kg/m    Physical Examination:  General: Awake, alert, well- nourished, NAD Skin: areas of erythematous papules under the breasts bilaterally, left more prominent than right. A few areas with mild scaling noted.   Assessment/Plan: Candidal intertrigo Seems to be less responsive to nystatin powder. - Rx for clotrimazole BID x 2 weeks - keep are dry - discussed return precautions.    No orders of the defined types were placed in this encounter.   Meds ordered this encounter  Medications  . clotrimazole (LOTRIMIN) 1 % cream    Sig: Apply 1 application topically 2 (two) times daily. Under breasts    Dispense:  30 g    Refill:  [facility] PGY-3, [place]

## 2017-02-20 NOTE — Patient Instructions (Signed)
STOP the Nystatin powder as it has not been working. Try to keep the area under your breasts dry. Use clotrimazole cream twice daily for two weeks- this should significantly improve your rash.   Skin Yeast Infection Skin yeast infection is a condition in which there is an overgrowth of yeast (candida) that normally lives on the skin. This condition usually occurs in areas of the skin that are constantly warm and moist, such as the armpits or the groin. What are the causes? This condition is caused by a change in the normal balance of the yeast and bacteria that live on the skin. What increases the risk? This condition is more likely to develop in:  People who are obese.  Pregnant women.  Women who take birth control pills.  People who have diabetes.  People who take antibiotic medicines.  People who take steroid medicines.  People who are malnourished.  People who have a weak defense (immune) system.  People who are 56 years of age or older. What are the signs or symptoms? Symptoms of this condition include:  A red, swollen area of the skin.  Bumps on the skin.  Itchiness. How is this diagnosed? This condition is diagnosed with a medical history and physical exam. Your health care provider may check for yeast by taking light scrapings of the skin to be viewed under a microscope. How is this treated? This condition is treated with medicine. Medicines may be prescribed or be available over-the-counter. The medicines may be:  Taken by mouth (orally).  Applied as a cream. Follow these instructions at home:  Take or apply over-the-counter and prescription medicines only as told by your health care provider.  Eat more yogurt. This may help to keep your yeast infection from returning.  Maintain a healthy weight. If you need help losing weight, talk with your health care provider.  Keep your skin clean and dry.  If you have diabetes, keep your blood sugar under  control. Contact a health care provider if:  Your symptoms go away and then return.  Your symptoms do not get better with treatment.  Your symptoms get worse.  Your rash spreads.  You have a fever or chills.  You have new symptoms.  You have new warmth or redness of your skin. This information is not intended to replace advice given to you by your health care provider. Make sure you discuss any questions you have with your health care provider. Document Released: 06/26/2011 Document Revised: 06/03/2016 Document Reviewed: 04/11/2015 Elsevier Interactive Patient Education  2017 [employer].

## 2017-02-20 NOTE — Assessment & Plan Note (Signed)
Seems to be less responsive to nystatin powder. - Rx for clotrimazole BID x 2 weeks - keep are dry - discussed return precautions.

## 2017-02-26 ENCOUNTER — Encounter: Payer: Self-pay | Admitting: Nurse Practitioner

## 2017-02-26 ENCOUNTER — Ambulatory Visit (INDEPENDENT_AMBULATORY_CARE_PROVIDER_SITE_OTHER): Payer: Medicare Other | Admitting: Nurse Practitioner

## 2017-02-26 VITALS — BP 134/82 | HR 60 | Ht 66.0 in | Wt 214.6 lb

## 2017-02-26 DIAGNOSIS — R400 Somnolence: Secondary | ICD-10-CM | POA: Diagnosis not present

## 2017-02-26 DIAGNOSIS — G43009 Migraine without aura, not intractable, without status migrainosus: Secondary | ICD-10-CM | POA: Diagnosis not present

## 2017-02-26 DIAGNOSIS — G40309 Generalized idiopathic epilepsy and epileptic syndromes, not intractable, without status epilepticus: Secondary | ICD-10-CM

## 2017-02-26 MED ORDER — PHENOBARBITAL 100 MG PO TABS: 100.0000 mg | ORAL_TABLET | Freq: Every day | ORAL | 5 refills | Status: DC

## 2017-02-26 MED ORDER — TOPIRAMATE 50 MG PO TABS: ORAL_TABLET | ORAL | 6 refills | Status: DC

## 2017-02-26 NOTE — Progress Notes (Signed)
[facility]  PATIENT: Yenny Kosa DOB: 1951-04-28   REASON FOR VISIT: Follow-up for epilepsy, chronic headaches with new complaint of morning headaches HISTORY FROM: Patient    HISTORY OF PRESENT ILLNESS:Ms Pierron, 66 year old female returns for followup. She has a history of seizure disorder as well as headaches. Last seizure occurred June 2013. She is currently on phenobarbital. 100mg  at night. She is also on Topamax 50 twice daily headaches. She continues to have left foot pain from previous surgery . Headaches initially improved with the addition of Topamax, headaches are bitemporal and at vertex of the head. She may have nausea and vomiting with headache. She has had more headaches in the last several months but now her headaches occur more in the morning when she wakes up. She complains of daytime drowsiness. She no longer drives a car. She also complains of joint pain in the knees shoulders and neck. She was recently diagnosed with fibromyalgia and rheumatoid arthritis. She sees a rheumatologist in [facility].  She does not exercise.  She has a history of multiple falls. She returns for reevaluation   HISTORY: of seizure events and a left hemisensory deficit. The patient indicates that she has gone 15 years without a seizure, and she suffered a seizure event in June 2013. The patient has been on phenobarbital, and she indicates that she has not missed a dose of the medication. In 2007, the patient had a MRI the brain that was normal. The patient indicates that over the last year, she began having headaches that are bitemporal and at the vertex of the head, associated with a throbbing pain. The headaches have gotten better with the addition of Topamax. The patient may have nausea and vomiting with the headache. The patient indicates that she does not drive a car. The patient continues to have left-sided numbness, and she feels somewhat weak on the left side. The  patient has her left leg in a cast. She is ambulating with a cane. The patient has significant degenerative arthritis of the knees, and she has low back pain. 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 EEG was normal.     REVIEW OF SYSTEMS: Full 14 system review of systems performed and notable only for those listed, all others are neg:  Constitutional: neg  Cardiovascular: neg Ear/Nose/Throat: neg  Skin: neg Eyes: Blurred vision Respiratory: Cough Gastroitestinal: neg  Hematology/Lymphatic: neg  Endocrine: neg Musculoskeletal: Pain in the knees, back Allergy/Immunology: neg Neurological: Seizure disorder, history of migraines, occasional dizziness Psychiatric: neg Sleep : Snoring daytime drowsiness ALLERGIES: Allergies  Allergen Reactions  . Amoxicillin     REACTION: hives  . Aspirin     REACTION: rash  . Codeine Phosphate     REACTION: N/V  . Flexeril [Cyclobenzaprine] Nausea And Vomiting    Dizziness   . Ibuprofen Hives  . Penicillins     REACTION: hives  . Tetanus Toxoids Swelling    HOME MEDICATIONS: Outpatient Medications Prior to Visit  Medication Sig Dispense Refill  . albuterol (PROVENTIL HFA;VENTOLIN HFA) 108 (90 Base) MCG/ACT inhaler Inhale 2 puffs into the lungs every 6 (six) hours as needed for wheezing or shortness of breath. 1 Inhaler 2  . carvedilol (COREG) 3.125 MG tablet TAKE 1 TABLET BY MOUTH TWICE A DAY WITH A MEAL 62 tablet 5  . citalopram (CELEXA) 20 MG tablet TAKE 1 TABLET BY MOUTH ONCE A DAY 30 tablet 3  . clotrimazole (LOTRIMIN) 1 % cream  Apply 1 application topically 2 (two) times daily. Under breasts 30 g 0  . folic acid (FOLVITE) 1 MG tablet Take 1 mg by mouth.    . gabapentin (NEURONTIN) 300 MG capsule TAKE 3 CAPSULES BY MOUTH TWICE A DAY 180 capsule 5  . HYDROcodone-acetaminophen (NORCO/VICODIN) 5-325 MG tablet Take 1 tablet by mouth 3 (three) times daily as needed for moderate pain. 90 tablet 0  .  methotrexate 50 MG/2ML injection     . omeprazole (PRILOSEC) 20 MG capsule TAKE 1 CAPSULE BY MOUTH ONCE DAILY 30 capsule 11  . PHENObarbital (LUMINAL) 100 MG tablet Take 1 tablet (100 mg total) by mouth daily. 30 tablet 5  . tiotropium (SPIRIVA) 18 MCG inhalation capsule Place 1 capsule (18 mcg total) into inhaler and inhale daily. 30 capsule 1  . topiramate (TOPAMAX) 50 MG tablet Take 1 tablet (50 mg total) by mouth 2 (two) times daily. 1 in the am and 2 at bedtime (Patient taking differently: Take 50 mg by mouth. 1 in the am and 2 at bedtime) 90 tablet 6  . triamcinolone ointment (KENALOG) 0.5 % APPLY TO SKIN TWICE DAILY AS DIRECTED 60 g 0  . promethazine (PHENERGAN) 12.5 MG tablet Take 1 tablet (12.5 mg total) by mouth every 8 (eight) hours as needed for nausea. 60 tablet 0  . alendronate (FOSAMAX) 70 MG tablet Take 70 mg by mouth once a week.     . baclofen (LIORESAL) 10 MG tablet Take 1 tablet (10 mg total) by mouth 3 (three) times daily as needed for muscle spasms. (Patient not taking: Reported on 02/26/2017) 30 each 0  . ENBREL SURECLICK 50 MG/ML injection     . Ipratropium-Albuterol (COMBIVENT RESPIMAT) 20-100 MCG/ACT AERS respimat Inhale 1 puff into the lungs every 6 (six) hours. (Patient not taking: Reported on 02/20/2017) 1 Inhaler 0  . predniSONE (DELTASONE) 10 MG tablet Take 1 tablet (10 mg total) by mouth daily with breakfast. (Patient not taking: Reported on 02/20/2017) 5 tablet 0   No facility-administered medications prior to visit.     PAST MEDICAL HISTORY: Past Medical History:  Diagnosis Date  . Chronic low back pain   . 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  . COPD (chronic obstructive pulmonary disease) ([place])   . Depression   . Fibromyalgia    08/18/15  [facility]  . GERD (gastroesophageal reflux disease)   . History of CVA (cerebrovascular accident)    "58 minni srokes"  . Hyperlipidemia   . Hypertension   . Migraine   . Personal history of  colonic polyp - adenoma 03/01/2014  . Personality disorder   . Rheumatoid arthritis ([place])    08/18/15  [facility]  . Seizures ([place])    last seizure 4 years ago  . Sleep apnea    Mild. No need for CPAP.  Marland Kitchen Stroke [facility])     PAST SURGICAL HISTORY: Past Surgical History:  Procedure Laterality Date  . ANKLE SURGERY Left   . APPENDECTOMY    . BACK SURGERY    . BUNIONECTOMY     2 toes  . CARDIAC CATHETERIZATION  2008   Normal. Dr Acie Fredrickson.  . CHOLECYSTECTOMY    . CYST EXCISION     leg  . FLANK MASS EXCISION    . FOOT TENDON SURGERY  june 2014   both feet  . KNEE SURGERY     Bilateral.  . NOSE SURGERY    . SHOULDER SURGERY  bil.  Marland Kitchen TOTAL ABDOMINAL HYSTERECTOMY      FAMILY HISTORY: Family History  Problem Relation Age of Onset  . Cirrhosis Mother   . Diabetes Mother   . Cirrhosis Father   . Breast cancer Sister   . Breast cancer Sister   . Colon cancer Neg Hx     SOCIAL HISTORY: Social History   Social History  . Marital status: Married    Spouse name: Clearence   . Number of children: 1  . Years of education: 43   Occupational History  . Unemployed     Social History Main Topics  . Smoking status: Former Research scientist (life sciences)  . Smokeless tobacco: Former Systems developer    Quit date: 11/03/2010  . Alcohol use No  . Drug use: No  . Sexual activity: Not on file   Other Topics Concern  . Not on file   Social History Narrative   Patient lives at home with her husband [name].    Patient is currently not working.    Patient has 1 child.    Patient has a 11th grade education.      PHYSICAL EXAM  Vitals:   02/26/17 1429  BP: 134/82  Pulse: 60  Weight: 214 lb 9.6 oz (97.3 kg)  Height: 5\' 6"  (1.676 m)   Body mass index is 34.64 kg/m. Generalized: Well developed, moderately obese female in no acute distress  Head: normocephalic and atraumatic,. Oropharynx benign  Neck: Supple, no carotid bruits  Cardiac: Regular rate rhythm, no murmur  Neurological examination   Mentation: Alert oriented to time, place, history taking. Follows all commands speech and language fluent ESS 15,  Cranial nerve II-XII: Pupils were equal round reactive to light extraocular movements were full, visual field were full on confrontational test. Facial sensation and strength were normal. hearing was intact to finger rubbing bilaterally. Uvula tongue midline. head turning and shoulder shrug were normal and symmetric.Tongue protrusion into cheek strength was normal.  Motor: normal bulk and tone, full strength in the BUE, BLE,   Sensory: normal and symmetric to light touch, pinprick, and vibration In the upper and lower extremities Coordination: finger-nose-finger, heel-to-shin bilaterally, no dysmetria  Reflexes: Depressed bilaterally upper and lower  Gait and Station: Rising up from seated position without assistance, ambulating short distances in the hall, no difficulty with turns, unsteady with tandem. Romberg negative. No assistive device  DIAGNOSTIC DATA (LABS, IMAGING, TESTING) -     ASSESSMENT AND PLAN  66 y.o. year old female  has a past medical history of Hypertension; Hyperlipidemia; Conversion disorder with seizures or convulsions; COPD (chronic obstructive pulmonary disease) ([place]); Depression; Personality disorder; Chronic low back pain;  Migraine; Stroke ([place]); History of CVA (cerebrovascular accident); Seizures ([place]);  Fibromyalgia; and Rheumatoid arthritis ([place]). here to follow-up for seizure disorder and migraines which are well controlled with complaint of morning headache with daytime drowsiness  PLANContinue Topamax 1 in the am and 2 in the pm will refill Continue phenobarbital at current dose will refill Will check phenobarbital level checked to therapeutic level CBC and CMP to check for adverse effects of phenobarbital Will get sleep study for daytime drowsiness, morning headache Followup in 6 months next with Dr. Jannifer Franklin I explained in particular the  risks and ramifications of untreated moderate to severe OSA, especially with respect to cardiovascular disease  including congestive heart failure, difficult to treat hypertension, cardiac arrhythmias, or stroke. Even type 2 diabetes has, in part, been linked to untreated OSA. Symptoms of untreated OSA include daytime  sleepiness, memory problems, mood irritability and mood disorder such as depression and anxiety, lack of energy, as well as recurrent headaches, especially morning headaches. We talked about trying to maintain a healthy lifestyle in general, as well as the importance of weight control. I encouraged the patient to eat healthy, exercise daily and keep well hydrated, to keep a scheduled bedtime and wake time routine, to not skip any meals and eat healthy snacks in between meals I spent 25 minutes in total face to face time with the patient more than 50% of which was spent counseling and coordination of care, reviewing test results reviewing medications and discussing and reviewing the diagnosis of migraine, seizure disorder, and obstructive sleep apnea untreated and further treatment options. , Rayburn Ma, [facility], APRN  [facility] [address] [place], [place] [address] [phone]

## 2017-02-26 NOTE — Patient Instructions (Addendum)
Continue Topamax 1 in the am and 2 in the pm will refill Continue phenobarbital at current dose will refill Will check phenobarbital level CBC and CMP  Will get sleep study for daytime drowsiness, morning headache Followup in 6 months next with Dr. Jannifer Franklin I explained in particular the risks and ramifications of untreated moderate to severe OSA, especially with respect to cardiovascular disease  including congestive heart failure, difficult to treat hypertension, cardiac arrhythmias, or stroke. Even type 2 diabetes has, in part, been linked to untreated OSA. Symptoms of untreated OSA include daytime sleepiness, memory problems, mood irritability and mood disorder such as depression and anxiety, lack of energy, as well as recurrent headaches, especially morning headaches. We talked about trying to maintain a healthy lifestyle in general, as well as the importance of weight control. I encouraged the patient to eat healthy, exercise daily and keep well hydrated, to keep a scheduled bedtime and wake time routine, to not skip any meals and eat healthy snacks in between meals

## 2017-02-26 NOTE — Progress Notes (Signed)
I have read the note, and I agree with the clinical assessment and plan.  Jayma Volpi KEITH   

## 2017-02-27 NOTE — Progress Notes (Signed)
Fax confirmation received for Phenobarbital [id number]. sy

## 2017-02-28 ENCOUNTER — Telehealth: Payer: Self-pay | Admitting: *Deleted

## 2017-02-28 LAB — CBC WITH DIFFERENTIAL/PLATELET
Basophils Absolute: 0 10*3/uL (ref 0.0–0.2)
Basos: 1 %
EOS (ABSOLUTE): 0.2 10*3/uL (ref 0.0–0.4)
EOS: 2 %
HEMATOCRIT: 33.7 % — AB (ref 34.0–46.6)
Hemoglobin: 10.5 g/dL — ABNORMAL LOW (ref 11.1–15.9)
IMMATURE GRANS (ABS): 0 10*3/uL (ref 0.0–0.1)
IMMATURE GRANULOCYTES: 0 %
LYMPHS: 54 %
Lymphocytes Absolute: 3.5 10*3/uL — ABNORMAL HIGH (ref 0.7–3.1)
MCH: 25.8 pg — ABNORMAL LOW (ref 26.6–33.0)
MCHC: 31.2 g/dL — AB (ref 31.5–35.7)
MCV: 83 fL (ref 79–97)
MONOS ABS: 0.6 10*3/uL (ref 0.1–0.9)
Monocytes: 9 %
NEUTROS PCT: 34 %
Neutrophils Absolute: 2.2 10*3/uL (ref 1.4–7.0)
PLATELETS: 238 10*3/uL (ref 150–379)
RBC: 4.07 x10E6/uL (ref 3.77–5.28)
RDW: 17 % — AB (ref 12.3–15.4)
WBC: 6.5 10*3/uL (ref 3.4–10.8)

## 2017-02-28 LAB — COMPREHENSIVE METABOLIC PANEL
A/G RATIO: 1 — AB (ref 1.2–2.2)
ALT: 12 IU/L (ref 0–32)
AST: 17 IU/L (ref 0–40)
Albumin: 3.7 g/dL (ref 3.6–4.8)
Alkaline Phosphatase: 120 IU/L — ABNORMAL HIGH (ref 39–117)
BUN/Creatinine Ratio: 18 (ref 12–28)
BUN: 11 mg/dL (ref 8–27)
Bilirubin Total: <0.2 mg/dL (ref 0.0–1.2)
CALCIUM: 8 mg/dL — AB (ref 8.7–10.3)
CO2: 20 mmol/L (ref 18–29)
Chloride: 104 mmol/L (ref 96–106)
Creatinine, Ser: 0.62 mg/dL (ref 0.57–1.00)
GFR, EST AFRICAN AMERICAN: 108 mL/min/{1.73_m2} (ref >59)
GFR, EST NON AFRICAN AMERICAN: 94 mL/min/{1.73_m2} (ref >59)
GLUCOSE: 104 mg/dL — AB (ref 65–99)
Globulin, Total: 3.6 g/dL (ref 1.5–4.5)
POTASSIUM: 4.1 mmol/L (ref 3.5–5.2)
Sodium: 141 mmol/L (ref 134–144)
Total Protein: 7.3 g/dL (ref 6.0–8.5)

## 2017-02-28 LAB — PHENOBARBITAL LEVEL: Phenobarbital, Serum: 18 ug/mL (ref 15–40)

## 2017-02-28 NOTE — Telephone Encounter (Signed)
Called and could not LM as VM full.  Will try later.

## 2017-02-28 NOTE — Telephone Encounter (Signed)
-----   Message from Dennie Bible, NP sent at 02/28/2017  7:49 AM EDT ----- Labs look good.except hgb a little low.   Please call patient . Please send results to PCP

## 2017-02-28 NOTE — Telephone Encounter (Signed)
Spoke to pt and relayed the results of her lab work.  She will f/u with her pcp about results (low HBG.).  She verbalized understanding.   Faxed to pcp.

## 2017-04-18 ENCOUNTER — Encounter: Payer: Self-pay | Admitting: Neurology

## 2017-04-18 ENCOUNTER — Ambulatory Visit (INDEPENDENT_AMBULATORY_CARE_PROVIDER_SITE_OTHER): Payer: Medicare Other | Admitting: Neurology

## 2017-04-18 VITALS — BP 133/74 | HR 68 | Ht 66.0 in | Wt 213.5 lb

## 2017-04-18 DIAGNOSIS — R5382 Chronic fatigue, unspecified: Secondary | ICD-10-CM | POA: Diagnosis not present

## 2017-04-18 DIAGNOSIS — K089 Disorder of teeth and supporting structures, unspecified: Secondary | ICD-10-CM

## 2017-04-18 DIAGNOSIS — G4730 Sleep apnea, unspecified: Secondary | ICD-10-CM | POA: Diagnosis not present

## 2017-04-18 DIAGNOSIS — G4710 Hypersomnia, unspecified: Secondary | ICD-10-CM

## 2017-04-18 DIAGNOSIS — J449 Chronic obstructive pulmonary disease, unspecified: Secondary | ICD-10-CM | POA: Diagnosis not present

## 2017-04-18 DIAGNOSIS — R0683 Snoring: Secondary | ICD-10-CM | POA: Insufficient documentation

## 2017-04-18 DIAGNOSIS — F4310 Post-traumatic stress disorder, unspecified: Secondary | ICD-10-CM | POA: Diagnosis not present

## 2017-04-18 DIAGNOSIS — R5381 Other malaise: Secondary | ICD-10-CM | POA: Diagnosis not present
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
## 2017-04-18 NOTE — Patient Instructions (Signed)

## 2017-05-01 ENCOUNTER — Ambulatory Visit (INDEPENDENT_AMBULATORY_CARE_PROVIDER_SITE_OTHER): Payer: Medicare Other | Admitting: Neurology

## 2017-05-01 DIAGNOSIS — G4710 Hypersomnia, unspecified: Secondary | ICD-10-CM

## 2017-05-01 DIAGNOSIS — F4310 Post-traumatic stress disorder, unspecified: Secondary | ICD-10-CM

## 2017-05-01 DIAGNOSIS — K089 Disorder of teeth and supporting structures, unspecified: Secondary | ICD-10-CM

## 2017-05-01 DIAGNOSIS — R5382 Chronic fatigue, unspecified: Principal | ICD-10-CM

## 2017-05-01 DIAGNOSIS — G4730 Sleep apnea, unspecified: Secondary | ICD-10-CM | POA: Diagnosis not present

## 2017-05-01 DIAGNOSIS — J449 Chronic obstructive pulmonary disease, unspecified: Secondary | ICD-10-CM

## 2017-05-01 DIAGNOSIS — R5381 Other malaise: Secondary | ICD-10-CM

## 2017-05-01 DIAGNOSIS — R0683 Snoring: Secondary | ICD-10-CM

## 2017-05-10 NOTE — Procedures (Signed)
PATIENT'S NAME:  [name], Haught DOB:      21-Jun-1951      MRN #:    [id number]     DATE OF RECORDING: 05/01/2017 REFERRING M.D.:  Dr. Jannifer Franklin, Cecille Rubin, NP  PCP: Rogue Bussing, MD Study Performed:   Baseline Polysomnogram HISTORY:  , Morbid obesity, Fatigue, Back pain, COPD, depression, PTSD, GERD, Hypertension, Migraines, Personality disorder, Rheumatoid arthritis, Seizures, and reported Stroke. A sleep study was ordered to evaluate sleep related headaches and EDS.  The patient endorsed the Epworth Sleepiness Scale at 19/24 points.   The patient's weight 214 pounds with a height of 66 (inches), resulting in a BMI of 34.4 kg/m2. The patient's neck circumference measured 16 inches.  CURRENT MEDICATIONS: Proventil, Coreg, Lotrimin, Folvite, Neurontin, Norco, Prilosec, Luminal, Spiriva, Topamax, Kenalog, Phenergan   PROCEDURE:  This is a multichannel digital polysomnogram utilizing the SomnoStar 11.2 system.  Electrodes and sensors were applied and monitored per AASM Specifications.   EEG, EOG, Chin and Limb EMG, were sampled at 200 Hz.  ECG, Snore and Nasal Pressure, Thermal Airflow, Respiratory Effort, CPAP Flow and Pressure, Oximetry was sampled at 50 Hz. Digital video and audio were recorded.      BASELINE STUDY : Lights Out was at 22:54 and Lights On at 05:17.  Total recording time (TRT) was 383.5 minutes, with a total sleep time (TST) of 324.5 minutes.  The patient's sleep latency was 38 minutes.  REM latency was 269 minutes.  The sleep efficiency was 84.6 %.     SLEEP ARCHITECTURE: WASO (Wake after sleep onset) was 20.5 minutes.  There were 17.5 minutes in Stage N1, 129 minutes Stage N2, 136.5 minutes Stage N3 and 41.5 minutes in Stage REM.  The percentage of Stage N1 was 5.4%, Stage N2 was 39.8%, Stage N3 was 42.1% and Stage R (REM sleep) was 12.8%.   RESPIRATORY ANALYSIS:  There were a total of 2 respiratory events 0 apneas and 2 hypopneas with 0 respiratory event related  arousals (RERAs).     The total APNEA/HYPOPNEA INDEX (AHI) was 0.4/hour and the total RESPIRATORY DISTURBANCE INDEX was 0.4 /hour.  2 events occurred in REM sleep and 0 events in NREM. The REM AHI was 2.9 /hour, versus a non-REM AHI of 0. The patient spent 3 minutes of total sleep time in the supine position and 322 minutes in non-supine. The supine AHI was 0.0 versus a non-supine AHI of 0.4.  OXYGEN SATURATION & C02:  The Wake baseline 02 saturation was 98%, with the lowest being 88%. Time spent below 89% saturation equaled 2 minutes. PERIODIC LIMB MOVEMENTS:  The patient had a total of 0 Periodic Limb Movements.  The arousals were noted as: 39 were spontaneous, 0 were associated with PLMs, and 0 were associated with respiratory events. Audio and video analysis did not show any abnormal or unusual movements, behaviors, phonations or vocalizations.     The patient did not take bathroom breaks. Snoring was not noted. EKG was in keeping with normal sinus rhythm (NSR). The Periodic Limb Movement (PLM) index was 0 and the PLM Arousal index was 0/hour.  Post-study, the patient indicated that sleep was the same as usual.    IMPRESSION: NO physiological sleep disorder was identified,  Hypersomnia is not related to apnea, PLMs, or hypoxemia. The sleep efficiency was 85%. Spontaneous arousals were recorded, these can be manifestations of pain, discomfort or anxiety.    RECOMMENDATIONS:  1. Consider dedicated sleep psychology referral if insomnia is of clinical concern.  2. A follow up appointment will not be scheduled in the Sleep Clinic at [facility]. The referring provider will be following up with results.      I certify that I have reviewed the entire raw data recording prior to the issuance of this report in accordance with the Standards of Accreditation of the American Academy of Sleep Medicine (AASM)     Larey Seat, MD   05-10-2017  Diplomat, American Board of  Psychiatry and Neurology  Diplomat, American Board of [place] Director, [employer] [facility] [employer]

## 2017-05-13 ENCOUNTER — Telehealth: Payer: Self-pay

## 2017-05-13 NOTE — Telephone Encounter (Signed)
I called pt. I advised pt that Dr. Brett Fairy reviewed her sleep study and found that her sleepiness and headaches are unexplained. Pt's sleep study was normal except for fragmented sleep due to spontaneous arousals that were not related to physiological functions. Pt did not have any hypoxemia. No follow up with Dr. Brett Fairy is needed. Pt verbalized understanding of results. Pt had no questions at this time but was encouraged to call back if questions arise.

## 2017-05-13 NOTE — Telephone Encounter (Signed)
-----   Message from Larey Seat, MD sent at 05/10/2017  2:42 PM EDT ----- This patient's sleepiness is unexplained- the sleep study was normal. Fragmented sleep due to spontaneous arousals, not related to physiological functions measured in a sleep study. No hypoxemia to explain headaches.  No follow up with sleep clinic needed.

## 2017-05-21 ENCOUNTER — Other Ambulatory Visit: Payer: Self-pay | Admitting: Internal Medicine

## 2017-05-21 DIAGNOSIS — J449 Chronic obstructive pulmonary disease, unspecified: Secondary | ICD-10-CM

## 2017-05-21 DIAGNOSIS — G8929 Other chronic pain: Secondary | ICD-10-CM

## 2017-05-21 DIAGNOSIS — I10 Essential (primary) hypertension: Secondary | ICD-10-CM

## 2017-05-21 NOTE — Telephone Encounter (Signed)
Patient calling to request refill of:  Name of Medication(s):  Albuterol, Carvedilol, Gabapentin, and Spiriva  Last date of OV:  02/20/2017 Pharmacy:  [facility]  Will route refill request to Clinic RN.  Discussed with patient policy to call pharmacy for future refills.  Also, discussed refills may take up to 48 hours to approve or deny.  [place]

## 2017-05-22 MED ORDER — TIOTROPIUM BROMIDE MONOHYDRATE 18 MCG IN CAPS: 18.0000 ug | ORAL_CAPSULE | Freq: Every day | RESPIRATORY_TRACT | 1 refills | Status: DC

## 2017-05-22 MED ORDER — GABAPENTIN 300 MG PO CAPS: ORAL_CAPSULE | ORAL | 5 refills | Status: DC

## 2017-05-22 MED ORDER — CARVEDILOL 3.125 MG PO TABS: ORAL_TABLET | ORAL | 5 refills | Status: DC

## 2017-05-22 MED ORDER — ALBUTEROL SULFATE HFA 108 (90 BASE) MCG/ACT IN AERS: 2.0000 | INHALATION_SPRAY | Freq: Four times a day (QID) | RESPIRATORY_TRACT | 2 refills | Status: DC | PRN

## 2017-06-17 ENCOUNTER — Telehealth: Payer: Self-pay | Admitting: Internal Medicine

## 2017-06-17 DIAGNOSIS — J449 Chronic obstructive pulmonary disease, unspecified: Secondary | ICD-10-CM

## 2017-06-17 DIAGNOSIS — F329 Major depressive disorder, single episode, unspecified: Secondary | ICD-10-CM

## 2017-06-17 DIAGNOSIS — F32A Depression, unspecified: Secondary | ICD-10-CM

## 2017-06-17 MED ORDER — TRIAMCINOLONE ACETONIDE 0.5 % EX OINT: TOPICAL_OINTMENT | CUTANEOUS | 0 refills | Status: DC

## 2017-06-17 MED ORDER — CITALOPRAM HYDROBROMIDE 20 MG PO TABS: ORAL_TABLET | ORAL | 3 refills | Status: DC

## 2017-06-17 MED ORDER — TIOTROPIUM BROMIDE MONOHYDRATE 18 MCG IN CAPS: 18.0000 ug | ORAL_CAPSULE | Freq: Every day | RESPIRATORY_TRACT | 1 refills | Status: DC

## 2017-06-17 MED ORDER — OMEPRAZOLE 20 MG PO CPDR: 20.0000 mg | DELAYED_RELEASE_CAPSULE | Freq: Every day | ORAL | 11 refills | Status: DC

## 2017-06-17 NOTE — Telephone Encounter (Signed)
I electronically sent in these prescriptions to the pharmacy in her chart. Please inform patient's daughter.

## 2017-06-17 NOTE — Telephone Encounter (Signed)
Daughter is calling because the pharmacy told her that they have faxed and called our offices for over a week and we have not replied or faxed back the patient prescriptions. Her mother needs the following. Kenalog Cream, Spiriva, Prilosec, Celexa. Daughter is a little upset over this. Would we be able to do anything today? Dr. Dallas Schimke is covering for Dr. Ola Spurr. jw

## 2017-07-02 ENCOUNTER — Other Ambulatory Visit: Payer: Self-pay | Admitting: Anesthesiology

## 2017-07-02 DIAGNOSIS — R202 Paresthesia of skin: Secondary | ICD-10-CM

## 2017-07-02 DIAGNOSIS — M542 Cervicalgia: Secondary | ICD-10-CM

## 2017-07-13 ENCOUNTER — Ambulatory Visit
Admission: RE | Admit: 2017-07-13 | Discharge: 2017-07-13 | Disposition: A | Discharge status: Home / Self Care | Payer: Medicare Other | Source: Ambulatory Visit | Attending: Anesthesiology | Admitting: Anesthesiology

## 2017-07-13 DIAGNOSIS — M542 Cervicalgia: Secondary | ICD-10-CM

## 2017-07-13 DIAGNOSIS — R202 Paresthesia of skin: Secondary | ICD-10-CM

## 2017-08-08 ENCOUNTER — Other Ambulatory Visit: Payer: Self-pay | Admitting: Nurse Practitioner

## 2017-08-08 ENCOUNTER — Other Ambulatory Visit: Payer: Self-pay | Admitting: Internal Medicine

## 2017-08-08 DIAGNOSIS — J449 Chronic obstructive pulmonary disease, unspecified: Secondary | ICD-10-CM

## 2017-08-09 ENCOUNTER — Other Ambulatory Visit: Payer: Self-pay | Admitting: *Deleted

## 2017-08-09 MED ORDER — PHENOBARBITAL 100 MG PO TABS: 100.0000 mg | ORAL_TABLET | Freq: Every day | ORAL | 5 refills | Status: DC

## 2017-08-09 NOTE — Telephone Encounter (Signed)
Phenobarbital refill Rx on Dr Jannifer Franklin' desk for signature.

## 2017-08-09 NOTE — Telephone Encounter (Signed)
Faxed printed/signed rx phenobarbital to [facility] at [phone]. Received fax confirmation.

## 2017-08-13 ENCOUNTER — Encounter: Payer: Self-pay | Admitting: Internal Medicine

## 2017-08-13 ENCOUNTER — Ambulatory Visit (INDEPENDENT_AMBULATORY_CARE_PROVIDER_SITE_OTHER): Payer: Medicare Other | Admitting: Internal Medicine

## 2017-08-13 VITALS — BP 116/78 | HR 74 | Temp 98.6°F | Wt 215.0 lb

## 2017-08-13 DIAGNOSIS — Z1231 Encounter for screening mammogram for malignant neoplasm of breast: Secondary | ICD-10-CM

## 2017-08-13 DIAGNOSIS — R0981 Nasal congestion: Secondary | ICD-10-CM | POA: Diagnosis present

## 2017-08-13 DIAGNOSIS — Z1159 Encounter for screening for other viral diseases: Secondary | ICD-10-CM

## 2017-08-13 DIAGNOSIS — J449 Chronic obstructive pulmonary disease, unspecified: Secondary | ICD-10-CM

## 2017-08-13 DIAGNOSIS — Z1239 Encounter for other screening for malignant neoplasm of breast: Secondary | ICD-10-CM

## 2017-08-13 DIAGNOSIS — D649 Anemia, unspecified: Secondary | ICD-10-CM

## 2017-08-13 MED ORDER — MINOCYCLINE HCL 100 MG PO CAPS: 100.0000 mg | ORAL_CAPSULE | Freq: Two times a day (BID) | ORAL | 0 refills | Status: DC

## 2017-08-13 MED ORDER — ALBUTEROL SULFATE HFA 108 (90 BASE) MCG/ACT IN AERS: 2.0000 | INHALATION_SPRAY | Freq: Four times a day (QID) | RESPIRATORY_TRACT | 2 refills | Status: DC | PRN

## 2017-08-13 MED ORDER — TIOTROPIUM BROMIDE MONOHYDRATE 18 MCG IN CAPS: ORAL_CAPSULE | RESPIRATORY_TRACT | 3 refills | Status: DC

## 2017-08-13 NOTE — Patient Instructions (Signed)
[name],  Please continue your daily inhaler and albuterol as needed. You may find some relief from a nasal spray, as well.  Take doxycyline (minocycline) 100 mg twice daily for the next 7 days for sinus infection. Restart methotrexate afterwards.  I will call you with lab results.  Best, Dr. Ola Spurr

## 2017-08-13 NOTE — Progress Notes (Signed)
[name] Progress Note  Subjective:  [name] is a 66 y.o. female with history of migraine, stroke, HTN, seizure disorder, depression, and RA who presents for SDA with complaint of cough and nasal congestion. She has had symptoms for over 2 weeks. Has associated headache and is feeling a lot of pressure in her face. She has a history of COPD but no recent spirometry. She has noted wheezing and has been needing to take her albuterol 3 times a day. She continues to take her spiriva appropriately. Cough interferes with her sleeping. Her chest hurts when she coughs. Denies sick contacts. Has had increased fatigue. She has not taken her methotrexate for the last 2 weeks due to feeling sick. ROS: No fevers, no hemoptysis  In addition, she requests recheck of blood counts per Rheumatology.  Allergies  Allergen Reactions  . Amoxicillin     REACTION: hives  . Aspirin     REACTION: rash  . Codeine Phosphate     REACTION: N/V  . Flexeril [Cyclobenzaprine] Nausea And Vomiting    Dizziness   . Ibuprofen Hives  . Penicillins     REACTION: hives  . Tetanus Toxoids Swelling    Social History  Substance Use Topics  . Smoking status: Former Research scientist (life sciences)  . Smokeless tobacco: Former Systems developer    Quit date: 11/03/2010  . Alcohol use No    Objective: Blood pressure 116/78, pulse 74, temperature 98.6 F (37 C), temperature source Oral, weight 215 lb (97.5 kg), SpO2 96 %. Body mass index is 34.7 kg/m. Constitutional: Obese female in NAD HENT: Nasal congestion present. Very tender maxillary sinuses with percussion bilaterally.  Cardiovascular: RRR, S1, S2, no m/r/g.  Pulmonary/Chest: Effort normal and breath sounds normal.  Abdominal: Soft. +BS, NT Skin: Skin is warm and dry. No rash noted.  Psychiatric: Normal mood and affect.  Vitals reviewed  Assessment/Plan: Nasal congestion - Suspect sinusitis given facial tenderness and persistent nasal congestion. Less likely COPD  exacerbation given clear lung exam and no increased sputum production.  - Ordered doxycycline 100 mg BID x 7 days, as patient has a penicillin allergy. Instructed patient not to restart methotrexate while on this, as could lead to toxic levels.  - Continue daily inhaler and albuterol as needed.   Ordered CBC with diff and ferritin and iron studies due to low hemoglobin on last CBC with Rheumatology.   Health Maintenance reviewed - mammogram ordered, patient to schedule appointment. Reports having had flu shot and possibly PPSV23 -- per care everywhere given 06/27/17 and 12/15/15, respectively. Ordered Hep C.   Follow-up prn.  Olene Floss, MD [place], PGY-3

## 2017-08-14 LAB — IRON AND TIBC
IRON SATURATION: 9 % — AB (ref 15–55)
IRON: 25 ug/dL — AB (ref 27–139)
TIBC: 287 ug/dL (ref 250–450)
UIBC: 262 ug/dL (ref 118–369)

## 2017-08-14 LAB — CBC WITH DIFFERENTIAL/PLATELET
Basophils Absolute: 0 10*3/uL (ref 0.0–0.2)
Basos: 1 %
EOS (ABSOLUTE): 0.1 10*3/uL (ref 0.0–0.4)
EOS: 2 %
HEMATOCRIT: 31.5 % — AB (ref 34.0–46.6)
HEMOGLOBIN: 10 g/dL — AB (ref 11.1–15.9)
Immature Grans (Abs): 0 10*3/uL (ref 0.0–0.1)
Immature Granulocytes: 0 %
LYMPHS ABS: 2.7 10*3/uL (ref 0.7–3.1)
Lymphs: 42 %
MCH: 25.6 pg — ABNORMAL LOW (ref 26.6–33.0)
MCHC: 31.7 g/dL (ref 31.5–35.7)
MCV: 81 fL (ref 79–97)
MONOCYTES: 10 %
Monocytes Absolute: 0.6 10*3/uL (ref 0.1–0.9)
NEUTROS ABS: 3 10*3/uL (ref 1.4–7.0)
Neutrophils: 45 %
Platelets: 209 10*3/uL (ref 150–379)
RBC: 3.9 x10E6/uL (ref 3.77–5.28)
RDW: 17 % — ABNORMAL HIGH (ref 12.3–15.4)
WBC: 6.5 10*3/uL (ref 3.4–10.8)

## 2017-08-14 LAB — FERRITIN: FERRITIN: 78 ng/mL (ref 15–150)

## 2017-08-14 LAB — HEPATITIS C ANTIBODY: Hep C Virus Ab: <0.1 s/co ratio (ref 0.0–0.9)

## 2017-08-15 DIAGNOSIS — R0981 Nasal congestion: Secondary | ICD-10-CM | POA: Insufficient documentation

## 2017-08-15 NOTE — Assessment & Plan Note (Signed)
-   Suspect sinusitis given facial tenderness and persistent nasal congestion. Less likely COPD exacerbation given clear lung exam and no increased sputum production.  - Ordered doxycycline 100 mg BID x 7 days, as patient has a penicillin allergy. Instructed patient not to restart methotrexate while on this, as could lead to toxic levels.  - Continue daily inhaler and albuterol as needed.

## 2017-08-16 ENCOUNTER — Other Ambulatory Visit (HOSPITAL_COMMUNITY): Payer: Self-pay | Admitting: Internal Medicine

## 2017-08-16 MED ORDER — FERROUS SULFATE 325 (65 FE) MG PO TABS: 325.0000 mg | ORAL_TABLET | Freq: Every day | ORAL | 2 refills | Status: DC

## 2017-09-05 ENCOUNTER — Encounter: Payer: Self-pay | Admitting: Neurology

## 2017-09-05 ENCOUNTER — Ambulatory Visit (INDEPENDENT_AMBULATORY_CARE_PROVIDER_SITE_OTHER): Payer: Medicare Other | Admitting: Neurology

## 2017-09-05 ENCOUNTER — Other Ambulatory Visit: Payer: Self-pay

## 2017-09-05 VITALS — BP 140/87 | HR 61 | Ht 66.0 in | Wt 219.0 lb

## 2017-09-05 DIAGNOSIS — Z5181 Encounter for therapeutic drug level monitoring: Secondary | ICD-10-CM

## 2017-09-05 DIAGNOSIS — R269 Unspecified abnormalities of gait and mobility: Secondary | ICD-10-CM

## 2017-09-05 DIAGNOSIS — M542 Cervicalgia: Secondary | ICD-10-CM

## 2017-09-05 DIAGNOSIS — G40309 Generalized idiopathic epilepsy and epileptic syndromes, not intractable, without status epilepticus: Secondary | ICD-10-CM

## 2017-09-05 DIAGNOSIS — G43009 Migraine without aura, not intractable, without status migrainosus: Secondary | ICD-10-CM

## 2017-09-05 DIAGNOSIS — G441 Vascular headache, not elsewhere classified: Secondary | ICD-10-CM

## 2017-09-05 DIAGNOSIS — E538 Deficiency of other specified B group vitamins: Secondary | ICD-10-CM | POA: Diagnosis not present

## 2017-09-05 NOTE — Progress Notes (Signed)
Reason for visit: Seizures  [name] is an 66 y.o. female  History of present illness:  [name] is a 66 year old right-handed white female with a history of seizures that have been well controlled on phenobarbital, and a history of headaches.  The patient usually has 2-3 headaches a week, he she has been on Topamax for this.  The patient also gives a history of fibromyalgia and rheumatoid arthritis.  She indicates that over the last several months she has developed pain in the neck and down the right arm, she has undergone MRI of the cervical spine done on 13 July 2017 that shows mild multilevel cervical degenerative disc disease.  This was unchanged from April 2016.  The patient indicates that she began having problems with falling over the last 4 or 5 weeks, she has fallen on 3 occasions.  On one fall she did hit her head, she has had daily headaches over the last 3 weeks since she hit her head.  The patient does report some dizziness and some changes in vision with blurred vision.  She has numbness on the entire right side of the body including the arm and the leg.  She denies issues controlling the bowels of the bladder.  She has not had any confusion.  She has pain in the right elbow and right shoulder when she moves the arm.  The pain is much better when the arm is resting.  She has pain into the right shoulder and neck.  She returns for an evaluation.  Past Medical History:  Diagnosis Date  . Chronic low back pain   . 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  . COPD (chronic obstructive pulmonary disease) ([place])   . Depression   . Fibromyalgia    08/18/15  [facility]  . GERD (gastroesophageal reflux disease)   . History of CVA (cerebrovascular accident)    "13 minni srokes"  . Hyperlipidemia   . Hypertension   . Migraine   . Personal history of colonic polyp - adenoma 03/01/2014  . Personality disorder ([place])   . Rheumatoid arthritis ([place])    08/18/15  [facility]  . Seizures ([place])    last seizure 4 years ago  . Sleep apnea    Mild. No need for CPAP.  Marland Kitchen Stroke [facility]     Past Surgical History:  Procedure Laterality Date  . ANKLE SURGERY Left   . APPENDECTOMY    . BACK SURGERY    . BUNIONECTOMY     2 toes  . CARDIAC CATHETERIZATION  2008   Normal. Dr Acie Fredrickson.  . CHOLECYSTECTOMY    . CYST EXCISION     leg  . FLANK MASS EXCISION    . FOOT TENDON SURGERY  june 2014   both feet  . KNEE SURGERY     Bilateral.  . NOSE SURGERY    . SHOULDER SURGERY     bil.  Marland Kitchen TOTAL ABDOMINAL HYSTERECTOMY      Family History  Problem Relation Age of Onset  . Cirrhosis Mother   . Diabetes Mother   . Cirrhosis Father   . Breast cancer Sister   . Breast cancer Sister   . Colon cancer Neg Hx     Social history:  reports that she has quit smoking. She quit smokeless tobacco use about 6 years ago. She reports that she does not drink alcohol or use drugs.    Allergies  Allergen Reactions  . Amoxicillin  REACTION: hives  . Aspirin     REACTION: rash  . Codeine Phosphate     REACTION: N/V  . Flexeril [Cyclobenzaprine] Nausea And Vomiting    Dizziness   . Ibuprofen Hives  . Penicillins     REACTION: hives  . Tetanus Toxoids Swelling    Medications:  Prior to Admission medications   Medication Sig Start Date End Date Taking? Authorizing Provider  albuterol (PROVENTIL HFA;VENTOLIN HFA) 108 (90 Base) MCG/ACT inhaler Inhale 2 puffs into the lungs every 6 (six) hours as needed for wheezing or shortness of breath. 08/13/17  Yes Rogue Bussing, MD  carvedilol (COREG) 3.125 MG tablet TAKE 1 TABLET BY MOUTH TWICE A DAY WITH A MEAL 05/22/17  Yes Rogue Bussing, MD  citalopram (CELEXA) 20 MG tablet TAKE 1 TABLET BY MOUTH ONCE A DAY 06/17/17  Yes Smiley Houseman, MD  clotrimazole (LOTRIMIN) 1 % cream Apply 1 application topically 2 (two) times daily. Under breasts 02/20/17  Yes Archie Patten, MD  ferrous sulfate 325  (65 FE) MG tablet Take 1 tablet (325 mg total) by mouth daily with breakfast. 08/16/17  Yes Rogue Bussing, MD  folic acid (FOLVITE) 1 MG tablet Take 1 mg by mouth. 05/12/15  Yes [provider]  gabapentin (NEURONTIN) 300 MG capsule TAKE 3 CAPSULES BY MOUTH TWICE A DAY 05/22/17  Yes Rogue Bussing, MD  HYDROcodone-acetaminophen (NORCO/VICODIN) 5-325 MG tablet Take 1 tablet by mouth 3 (three) times daily as needed for moderate pain. 09/05/15  Yes Frazier Richards, MD  methotrexate 50 MG/2ML injection  08/04/16  Yes [provider]  minocycline (MINOCIN,DYNACIN) 100 MG capsule Take 1 capsule (100 mg total) by mouth 2 (two) times daily. 08/13/17  Yes Rogue Bussing, MD  omeprazole (PRILOSEC) 20 MG capsule Take 1 capsule (20 mg total) by mouth daily. 06/17/17  Yes Smiley Houseman, MD  PHENObarbital (LUMINAL) 100 MG tablet Take 1 tablet (100 mg total) by mouth daily. 08/09/17  Yes Kathrynn Ducking, MD  tiotropium (SPIRIVA HANDIHALER) 18 MCG inhalation capsule INHALE THE CONTENTS OF 1 CAPSULE VIA HANDIHALER BY MOUTH ONCE DAILY 08/13/17  Yes Rogue Bussing, MD  topiramate (TOPAMAX) 50 MG tablet 1 in the am and 2 at bedtime 02/26/17  Yes Dennie Bible, NP  triamcinolone ointment (KENALOG) 0.5 % APPLY TO SKIN TWICE DAILY AS DIRECTED 06/17/17  Yes Smiley Houseman, MD    ROS:  Out of a complete 14 system review of symptoms, the patient complains only of the following symptoms, and all other reviewed systems are negative.  Blurred vision Wheezing Leg swelling Joint pain, joint swelling, back pain, aching muscles, walking difficulty, neck pain, neck stiffness Moles Bruising easily, anemia Headache, seizures  Blood pressure 140/87, pulse 61, height 5\' 6"  (1.676 m), weight 219 lb (99.3 kg).  Physical Exam  General: The patient is alert and cooperative at the time of the examination.  The patient is moderately obese.  Respiratory:  Lung fields are clear.  Cardiovascular: Regular rate and rhythm, no murmurs or rubs are noted.  Neck: Neck is supple, no carotid bruits are noted.  Eyes: Pupils are equal, round, and reactive to light.  Discs are flat bilaterally.  Skin: 1+ edema at the ankles is noted bilaterally..   Neurologic Exam  Mental status: The patient is alert and oriented x 3 at the time of the examination. The patient has apparent normal recent and remote memory, with an apparently normal attention span  and concentration ability.   Cranial nerves: Facial symmetry is present. Speech is normal, no aphasia or dysarthria is noted. Extraocular movements are full. Visual fields are full.  Motor: The patient has good strength in all 4 extremities, but the patient has a lot of giveaway type weakness involving the right arm, she is not able to elevate the right arm more than 45 degrees, she has pain with passive movement across the shoulder and elbow.  Sensory examination: Soft touch sensation and pinprick sensation are symmetric on the forehead, but the patient splits the forehead with vibration sensation, decreased on the right.  With the arms, there is decreased pinprick, soft touch, and vibration sensation on the right arm right leg as compared to the left.  Coordination: The patient has good finger-nose-finger and heel-to-shin bilaterally, but the patient has some pain with performing finger-nose-finger on the right arm.  Gait and station: The patient has a a slightly wide-based gait, the patient can walk independently, tandem gait is unsteady.  Romberg is unsteady  Reflexes: Deep tendon reflexes are symmetric, but are depressed.  Toes are downgoing bilaterally.   MRI cervical July 14, 2017:  IMPRESSION: 1. Mild multilevel cervical degenerative disc disease, unchanged from 02/07/2015. 2. Unchanged mild left C4-5 foraminal stenosis.  * MRI scan images were reviewed online. I agree with the written  report.    Assessment/Plan:  1.  History of seizures, well controlled  2.  Headache, increased following a fall  3.  Right hemisensory deficit, nonorganic sensory examination  4.  Neck pain, right arm pain  5.  Gait disorder, multiple falls  The patient has come into the office today with several new symptoms.  Her headaches have significantly worsened following a fall with a bump to the head.  A CT scan of the brain will be done.  The patient has had a recent MRI of the cervical spine for neck pain and right arm pain which was relatively unremarkable.  The patient is having a lot of pain across the elbow and shoulder joints, the problem could be orthopedic in nature, but the patient reports a right hemisensory deficit.  The patient will be set up for nerve conduction studies on both arms and EMG on the right arm.  Blood work will be done today looking for an etiology of her gait disorder.  She will follow-up for the EMG evaluation and for her next revisit in 6 months.  If the EMG is unremarkable, and orthopedic referral may be indicated.  Jill Alexanders MD 09/05/2017 3:04 PM  [facility] [address] [place], [place] [address]  Phone [phone] Fax [phone]

## 2017-09-05 NOTE — Patient Instructions (Signed)
   We will check CT of the brain and get blood work today. We will get EMG and NCV to look at the nerve function of the right arm.

## 2017-09-07 ENCOUNTER — Other Ambulatory Visit: Payer: Self-pay | Admitting: Internal Medicine

## 2017-09-07 ENCOUNTER — Other Ambulatory Visit: Payer: Self-pay | Admitting: Nurse Practitioner

## 2017-09-07 DIAGNOSIS — F329 Major depressive disorder, single episode, unspecified: Secondary | ICD-10-CM

## 2017-09-07 DIAGNOSIS — F32A Depression, unspecified: Secondary | ICD-10-CM

## 2017-09-10 ENCOUNTER — Telehealth: Payer: Self-pay | Admitting: Neurology

## 2017-09-10 ENCOUNTER — Encounter: Payer: Medicare Other | Admitting: Neurology

## 2017-09-10 LAB — CBC WITH DIFFERENTIAL/PLATELET
BASOS: 0 %
Basophils Absolute: 0 10*3/uL (ref 0.0–0.2)
EOS (ABSOLUTE): 0.2 10*3/uL (ref 0.0–0.4)
Eos: 3 %
Hematocrit: 32.3 % — ABNORMAL LOW (ref 34.0–46.6)
Hemoglobin: 9.9 g/dL — ABNORMAL LOW (ref 11.1–15.9)
IMMATURE GRANULOCYTES: 0 %
Immature Grans (Abs): 0 10*3/uL (ref 0.0–0.1)
LYMPHS ABS: 2.9 10*3/uL (ref 0.7–3.1)
Lymphs: 40 %
MCH: 25.8 pg — AB (ref 26.6–33.0)
MCHC: 30.7 g/dL — AB (ref 31.5–35.7)
MCV: 84 fL (ref 79–97)
MONOS ABS: 0.7 10*3/uL (ref 0.1–0.9)
Monocytes: 9 %
NEUTROS PCT: 48 %
Neutrophils Absolute: 3.4 10*3/uL (ref 1.4–7.0)
PLATELETS: 266 10*3/uL (ref 150–379)
RBC: 3.84 x10E6/uL (ref 3.77–5.28)
RDW: 16 % — AB (ref 12.3–15.4)
WBC: 7.2 10*3/uL (ref 3.4–10.8)

## 2017-09-10 LAB — COMPREHENSIVE METABOLIC PANEL
A/G RATIO: 0.9 — AB (ref 1.2–2.2)
ALBUMIN: 3.7 g/dL (ref 3.6–4.8)
ALT: 9 IU/L (ref 0–32)
AST: 15 IU/L (ref 0–40)
Alkaline Phosphatase: 125 IU/L — ABNORMAL HIGH (ref 39–117)
BUN/Creatinine Ratio: 21 (ref 12–28)
BUN: 14 mg/dL (ref 8–27)
Bilirubin Total: <0.2 mg/dL (ref 0.0–1.2)
CALCIUM: 8.3 mg/dL — AB (ref 8.7–10.3)
CO2: 22 mmol/L (ref 20–29)
Chloride: 108 mmol/L — ABNORMAL HIGH (ref 96–106)
Creatinine, Ser: 0.66 mg/dL (ref 0.57–1.00)
GFR calc Af Amer: 105 mL/min/{1.73_m2} (ref >59)
GFR calc non Af Amer: 91 mL/min/{1.73_m2} (ref >59)
GLOBULIN, TOTAL: 4.1 g/dL (ref 1.5–4.5)
Glucose: 90 mg/dL (ref 65–99)
POTASSIUM: 4.4 mmol/L (ref 3.5–5.2)
SODIUM: 141 mmol/L (ref 134–144)
TOTAL PROTEIN: 7.8 g/dL (ref 6.0–8.5)

## 2017-09-10 LAB — SEDIMENTATION RATE: Sed Rate: 103 mm/hr — ABNORMAL HIGH (ref 0–40)

## 2017-09-10 LAB — COPPER, SERUM: COPPER: 147 ug/dL (ref 72–166)

## 2017-09-10 LAB — PHENOBARBITAL LEVEL: PHENOBARBITAL, SERUM: 24 ug/mL (ref 15–40)

## 2017-09-10 LAB — B. BURGDORFI ANTIBODIES: Lyme IgG/IgM Ab: <0.91 {ISR} (ref 0.00–0.90)

## 2017-09-10 LAB — VITAMIN B12: Vitamin B-12: 483 pg/mL (ref 232–1245)

## 2017-09-10 LAB — RPR: RPR: NONREACTIVE

## 2017-09-10 NOTE — Telephone Encounter (Signed)
I called the patient.  The blood work shows an extremely elevated sedimentation rate of 103, the patient does have rheumatoid arthritis, followed by Dr. Veneta Penton and Dr. Maudie Mercury at [facility].  The patient is on methotrexate.  She has a mild anemia that is chronic in nature, she has a slightly low calcium level, elevated chloride level, slightly elevated alkaline phosphatase level.  The patient will be having EMG nerve conduction study evaluation, would wonder if the rheumatoid arthritis that she is followed for may have something to do with her current right arm pain.

## 2017-09-11 ENCOUNTER — Telehealth: Payer: Self-pay | Admitting: Neurology

## 2017-09-11 ENCOUNTER — Other Ambulatory Visit: Payer: Self-pay | Admitting: Internal Medicine

## 2017-09-11 ENCOUNTER — Ambulatory Visit
Admission: RE | Admit: 2017-09-11 | Discharge: 2017-09-11 | Disposition: A | Discharge status: Home / Self Care | Payer: Medicare Other | Source: Ambulatory Visit | Attending: Neurology | Admitting: Neurology

## 2017-09-11 DIAGNOSIS — Z1231 Encounter for screening mammogram for malignant neoplasm of breast: Secondary | ICD-10-CM

## 2017-09-11 DIAGNOSIS — G441 Vascular headache, not elsewhere classified: Secondary | ICD-10-CM

## 2017-09-11 NOTE — Telephone Encounter (Signed)
I called the patient.  CT of the brain shows mild to moderate white matter changes, no evidence of trauma within the brain following the fall.  The patient has a nonorganic right hemisensory deficit.   CT head 09/11/17:  IMPRESSION:  This CT scan of the head without contrast shows the following: 1.    Hypodense white matter foci consistent with chronic microvascular ischemic change, progressed since 01/31/2004 2.    There is a 9-10 mm stable cystic focus below the basal ganglia on the right consistent with an expanded Virchow-Robin space, unchanged compared to a CT scan from 01/31/2004.

## 2017-09-18 ENCOUNTER — Ambulatory Visit (INDEPENDENT_AMBULATORY_CARE_PROVIDER_SITE_OTHER): Payer: Medicare Other | Admitting: Neurology

## 2017-09-18 ENCOUNTER — Encounter: Payer: Self-pay | Admitting: Neurology

## 2017-09-18 DIAGNOSIS — M797 Fibromyalgia: Secondary | ICD-10-CM

## 2017-09-18 DIAGNOSIS — M542 Cervicalgia: Secondary | ICD-10-CM

## 2017-09-18 NOTE — Progress Notes (Addendum)
The patient comes in for EMG and nerve conduction study evaluation today.  The study is completely normal, the patient is having significant discomfort in the right elbow and right shoulder.  She will be seen by Dr. Veneta Penton from rheumatology in the near future.  Her sedimentation rate is greater than 100 suggesting an inflammatory process.  The patient has a history of rheumatoid arthritis.    Early    Nerve / Sites Muscle Latency Ref. Amplitude Ref. Rel Amp Segments Distance Velocity Ref. Area    ms ms mV mV %  cm m/s m/s mVms  L Median - APB     Wrist APB 2.9 ?4.4 13.0 ?4.0 100 Wrist - APB 7   49.8     Upper arm APB 7.1  13.7  105 Upper arm - Wrist 24 57 ?49 40.8  R Median - APB     Wrist APB 3.1 ?4.4 8.6 ?4.0 100 Wrist - APB 7   33.9     Upper arm APB 7.1  9.7  112 Upper arm - Wrist 24 60 ?49 34.5  L Ulnar - ADM     Wrist ADM 2.8 ?3.3 5.2 ?6.0 100 Wrist - ADM 7   20.0     B.Elbow ADM 6.8  5.2  101 B.Elbow - Wrist 22 55 ?49 19.3     A.Elbow ADM 8.3  4.9  93.8 A.Elbow - B.Elbow 10 66 ?49 18.1         A.Elbow - Wrist      R Ulnar - ADM     Wrist ADM 2.3 ?3.3 8.6 ?6.0 100 Wrist - ADM 7   28.9     B.Elbow ADM 6.1  8.2  96.2 B.Elbow - Wrist 21 55 ?49 29.8     A.Elbow ADM 7.8  7.0  84.7 A.Elbow - B.Elbow 10 60 ?49 27.6         A.Elbow - Wrist                 SNC    Nerve / Sites Rec. Site Peak Lat Amp Segments Distance    ms V  cm  L Median - Orthodromic (Dig II, Mid palm)     Dig II Wrist 3.2 28 Dig II - Wrist 13  R Median - Orthodromic (Dig II, Mid palm)     Dig II Wrist 3.0 22 Dig II - Wrist 13  L Ulnar - Orthodromic, (Dig V, Mid palm)     Dig V Wrist 3.0 26 Dig V - Wrist 11  R Ulnar - Orthodromic, (Dig V, Mid palm)     Dig V Wrist 2.9 33 Dig V - Wrist 11             F  Wave    Nerve F Lat Ref.   ms ms  L Median - APB 26.4 ?31.0  L Ulnar - ADM 26.5 ?32.0  R Median - APB 29.0 ?31.0           EMG

## 2017-09-18 NOTE — Progress Notes (Signed)
Please refer to EMG and nerve conduction study procedure note. 

## 2017-09-18 NOTE — Procedures (Signed)
     HISTORY:  [name] is a 66 year old patient with a history of rheumatoid arthritis who is complaining of right shoulder and right elbow discomfort, pain with moving the arm.  The patient is being evaluated for a possible neuropathy or a cervical radiculopathy.  NERVE CONDUCTION STUDIES:  Nerve conduction studies were performed on both upper extremities. The distal motor latencies and motor amplitudes for the median and ulnar nerves were within normal limits. The F wave latencies and nerve conduction velocities for these nerves were also normal. The sensory latencies for the median and ulnar nerves were normal.   EMG STUDIES:  EMG study was performed on the right upper extremity:  The first dorsal interosseous muscle reveals 2 to 4 K units with full recruitment. No fibrillations or positive waves were noted. The abductor pollicis brevis muscle reveals 2 to 4 K units with full recruitment. No fibrillations or positive waves were noted. The extensor indicis proprius muscle reveals 1 to 3 K units with full recruitment. No fibrillations or positive waves were noted. The biceps muscle reveals 1 to 2 K units with full recruitment. No fibrillations or positive waves were noted. The triceps muscle reveals 2 to 3 K units with full recruitment. No fibrillations or positive waves were noted. The anterior deltoid muscle reveals 2 to 3 K units with full recruitment. No fibrillations or positive waves were noted. The cervical paraspinal muscles were tested at 2 levels. No abnormalities of insertional activity were seen at either level tested. There was good relaxation.   IMPRESSION:  Nerve conduction studies done on both upper extremities were within normal limits.  No evidence of a neuropathy is seen.  EMG study of the right upper extremity was normal without evidence of an overlying cervical radiculopathy.  Jill Alexanders MD 09/18/2017 3:25 PM  [facility] [address] [place], [place] [address]  Phone [phone] Fax [phone]

## 2017-10-10 ENCOUNTER — Ambulatory Visit
Admission: RE | Admit: 2017-10-10 | Discharge: 2017-10-10 | Disposition: A | Discharge status: Home / Self Care | Payer: Medicare Other | Source: Ambulatory Visit | Attending: Family Medicine | Admitting: Family Medicine

## 2017-10-10 DIAGNOSIS — Z1231 Encounter for screening mammogram for malignant neoplasm of breast: Secondary | ICD-10-CM

## 2017-10-11 ENCOUNTER — Other Ambulatory Visit: Payer: Self-pay | Admitting: Internal Medicine

## 2017-12-11 ENCOUNTER — Encounter: Payer: Self-pay | Admitting: Emergency Medicine

## 2017-12-11 ENCOUNTER — Other Ambulatory Visit: Payer: Self-pay

## 2017-12-11 ENCOUNTER — Emergency Department
Admission: EM | Admit: 2017-12-11 | Discharge: 2017-12-11 | Disposition: A | Discharge status: Home / Self Care | Payer: Medicare Other | Attending: Emergency Medicine | Admitting: Emergency Medicine

## 2017-12-11 ENCOUNTER — Emergency Department: Payer: Medicare Other

## 2017-12-11 DIAGNOSIS — Z79899 Other long term (current) drug therapy: Secondary | ICD-10-CM | POA: Insufficient documentation

## 2017-12-11 DIAGNOSIS — J45909 Unspecified asthma, uncomplicated: Secondary | ICD-10-CM | POA: Insufficient documentation

## 2017-12-11 DIAGNOSIS — J449 Chronic obstructive pulmonary disease, unspecified: Secondary | ICD-10-CM | POA: Diagnosis not present

## 2017-12-11 DIAGNOSIS — R1084 Generalized abdominal pain: Secondary | ICD-10-CM | POA: Insufficient documentation

## 2017-12-11 DIAGNOSIS — K5792 Diverticulitis of intestine, part unspecified, without perforation or abscess without bleeding: Secondary | ICD-10-CM

## 2017-12-11 DIAGNOSIS — Z87891 Personal history of nicotine dependence: Secondary | ICD-10-CM | POA: Insufficient documentation

## 2017-12-11 DIAGNOSIS — R109 Unspecified abdominal pain: Secondary | ICD-10-CM | POA: Diagnosis present

## 2017-12-11 DIAGNOSIS — Z8673 Personal history of transient ischemic attack (TIA), and cerebral infarction without residual deficits: Secondary | ICD-10-CM | POA: Diagnosis not present

## 2017-12-11 HISTORY — DX: Unspecified asthma, uncomplicated: J45.909

## 2017-12-11 LAB — COMPREHENSIVE METABOLIC PANEL
ALBUMIN: 3.2 g/dL — AB (ref 3.5–5.0)
ALT: 12 U/L — AB (ref 14–54)
AST: 15 U/L (ref 15–41)
Alkaline Phosphatase: 90 U/L (ref 38–126)
Anion gap: 5 (ref 5–15)
BUN: 19 mg/dL (ref 6–20)
CHLORIDE: 109 mmol/L (ref 101–111)
CO2: 24 mmol/L (ref 22–32)
CREATININE: 0.82 mg/dL (ref 0.44–1.00)
Calcium: 8.1 mg/dL — ABNORMAL LOW (ref 8.9–10.3)
GFR calc Af Amer: >60 mL/min (ref >60)
GFR calc non Af Amer: >60 mL/min (ref >60)
GLUCOSE: 76 mg/dL (ref 65–99)
POTASSIUM: 4.3 mmol/L (ref 3.5–5.1)
Sodium: 138 mmol/L (ref 135–145)
TOTAL PROTEIN: 7.4 g/dL (ref 6.5–8.1)
Total Bilirubin: 0.3 mg/dL (ref 0.3–1.2)

## 2017-12-11 LAB — CBC
HCT: 33.1 % — ABNORMAL LOW (ref 35.0–47.0)
HEMOGLOBIN: 10.3 g/dL — AB (ref 12.0–16.0)
MCH: 25.3 pg — ABNORMAL LOW (ref 26.0–34.0)
MCHC: 31.1 g/dL — ABNORMAL LOW (ref 32.0–36.0)
MCV: 81.2 fL (ref 80.0–100.0)
Platelets: 204 10*3/uL (ref 150–440)
RBC: 4.07 MIL/uL (ref 3.80–5.20)
RDW: 18.6 % — ABNORMAL HIGH (ref 11.5–14.5)
WBC: 6.1 10*3/uL (ref 3.6–11.0)

## 2017-12-11 LAB — LIPASE, BLOOD: LIPASE: 20 U/L (ref 11–51)

## 2017-12-11 LAB — TROPONIN I

## 2017-12-11 MED ORDER — CIPROFLOXACIN HCL 500 MG PO TABS: 500.0000 mg | ORAL_TABLET | Freq: Two times a day (BID) | ORAL | 0 refills | Status: AC

## 2017-12-11 MED ORDER — SODIUM CHLORIDE 0.9 % IV BOLUS (SEPSIS)
1000.0000 mL | Freq: Once | INTRAVENOUS | Status: AC
  Administered 2017-12-11: 1000 mL via INTRAVENOUS

## 2017-12-11 MED ORDER — IOPAMIDOL (ISOVUE-300) INJECTION 61%
100.0000 mL | Freq: Once | INTRAVENOUS | Status: AC | PRN
  Administered 2017-12-11: 100 mL via INTRAVENOUS

## 2017-12-11 MED ORDER — GI COCKTAIL ~~LOC~~
30.0000 mL | Freq: Once | ORAL | Status: AC
  Administered 2017-12-11: 30 mL via ORAL

## 2017-12-11 MED ORDER — METRONIDAZOLE 500 MG PO TABS
500.0000 mg | ORAL_TABLET | Freq: Once | ORAL | Status: AC
  Administered 2017-12-11: 500 mg via ORAL
  Filled 2017-12-11: qty 1

## 2017-12-11 MED ORDER — ONDANSETRON HCL 4 MG/2ML IJ SOLN
4.0000 mg | Freq: Once | INTRAMUSCULAR | Status: AC
  Administered 2017-12-11: 4 mg via INTRAVENOUS

## 2017-12-11 MED ORDER — OXYCODONE-ACETAMINOPHEN 5-325 MG PO TABS: 1.0000 | ORAL_TABLET | Freq: Four times a day (QID) | ORAL | 0 refills | Status: DC | PRN

## 2017-12-11 MED ORDER — GI COCKTAIL ~~LOC~~
ORAL | Status: AC
  Administered 2017-12-11: 30 mL via ORAL
  Filled 2017-12-11: qty 30

## 2017-12-11 MED ORDER — MORPHINE SULFATE (PF) 4 MG/ML IV SOLN
4.0000 mg | Freq: Once | INTRAVENOUS | Status: AC
  Administered 2017-12-11: 4 mg via INTRAVENOUS

## 2017-12-11 MED ORDER — MORPHINE SULFATE (PF) 4 MG/ML IV SOLN
4.0000 mg | Freq: Once | INTRAVENOUS | Status: AC
Start: 2017-12-11 — End: 2017-12-11
  Administered 2017-12-11: 4 mg via INTRAVENOUS
  Filled 2017-12-11: qty 1

## 2017-12-11 MED ORDER — METRONIDAZOLE 500 MG PO TABS: 500.0000 mg | ORAL_TABLET | Freq: Three times a day (TID) | ORAL | 0 refills | Status: AC

## 2017-12-11 MED ORDER — ONDANSETRON HCL 4 MG/2ML IJ SOLN
INTRAMUSCULAR | Status: AC
  Administered 2017-12-11: 4 mg via INTRAVENOUS
  Filled 2017-12-11: qty 2

## 2017-12-11 MED ORDER — CIPROFLOXACIN HCL 500 MG PO TABS
500.0000 mg | ORAL_TABLET | Freq: Once | ORAL | Status: AC
  Administered 2017-12-11: 500 mg via ORAL
  Filled 2017-12-11: qty 1

## 2017-12-11 MED ORDER — MORPHINE SULFATE (PF) 4 MG/ML IV SOLN
INTRAVENOUS | Status: AC
  Administered 2017-12-11: 4 mg via INTRAVENOUS
  Filled 2017-12-11: qty 1

## 2017-12-11 NOTE — ED Triage Notes (Signed)
Pt here today for epigastric pain worse after eating.  Has had gallbladder out.  Nausea without vomiting.  No urinary sx. Reports temp of 99 something at home.  Sx X 3 days.  NAD.

## 2017-12-11 NOTE — ED Notes (Signed)
ED Provider at bedside. 

## 2017-12-11 NOTE — ED Notes (Signed)
Pt verbalizes d/c understanding, RX and follow up. Pt in NAD , VS stable at departure. Pt refuses offer for wc, pt ambulatory with family

## 2017-12-11 NOTE — ED Provider Notes (Signed)
[facility] Emergency Department Provider Note ____________________________________________   I have reviewed the triage vital signs and the triage nursing note.  HISTORY  Chief Complaint Abdominal Pain   Historian Patient  HPI [name] is a 67 y.o. female presents with abdominal pain it sounds like for at least 24 hours.  She initially started with burping and belching and now it hurts all the way down past her navel.  She states that she feels mildly swollen.  She said nausea without vomiting.  She denies problems with consultation diarrhea.  States that she has some issues with chronic abdominal pain, but this feels significantly worse.  Pain is moderate to severe at this current point time.  She states that eating initially seem to make worse, but now is constant.   Past Medical History:  Diagnosis Date  . Asthma   . Chronic low back pain   . 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  . COPD (chronic obstructive pulmonary disease) ([place])   . Depression   . Fibromyalgia    08/18/15  [facility]  . GERD (gastroesophageal reflux disease)   . History of CVA (cerebrovascular accident)    "16 minni srokes"  . Hyperlipidemia   . Hypertension   . Migraine   . Personal history of colonic polyp - adenoma 03/01/2014  . Personality disorder ([place])   . Rheumatoid arthritis ([place])    08/18/15  [facility]  . Seizures ([place])    last seizure 4 years ago  . Sleep apnea    Mild. No need for CPAP.  Marland Kitchen Stroke [facility])     Patient Active Problem List   Diagnosis Date Noted  . Nasal congestion 08/15/2017  . Hypersomnia with sleep apnea 04/18/2017  . Snoring 04/18/2017  . Post traumatic stress disorder (PTSD) 04/18/2017  . COPD mixed type ([place]) 04/18/2017  . Somnolence, daytime 02/26/2017  . Cervical paraspinal muscle spasm 10/26/2016  . Lower back pain 05/27/2016  . Left hip pain 05/27/2016  . Fibromyalgia 08/18/2015  . Rheumatoid arthritis  ([place]) 03/09/2015  . Essential hypertension, benign 02/18/2015  . Right knee pain 02/18/2015  . Cervical disc disorder with radiculopathy of cervical region 01/21/2015  . Bilateral arm pain 01/13/2015  . After cataract 07/22/2014  . Personal history of colonic polyp - adenoma 03/01/2014  . Candidal intertrigo 12/29/2013  . Dyshidrotic eczema 12/29/2013  . Routine adult health maintenance 12/29/2013  . 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 Frequent falls 02/10/2013  . Generalized pain 08/30/2012  . STROKE 07/29/2008  . SEIZURE DISORDER 07/29/2008  . Migraine without aura 07/29/2007  . DEPENDENCE, BARB/SED, CONTINUOUS 06/24/2007  . HYPERLIPIDEMIA 12/19/2006  . OBESITY, NOS 12/19/2006  . DEPRESSION, MAJOR, RECURRENT 12/19/2006  . PANIC ATTACKS 12/19/2006  . 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 BORDERLINE PERSONALITY 12/19/2006  . COPD 12/19/2006  . REFLUX ESOPHAGITIS 12/19/2006    Past Surgical History:  Procedure Laterality Date  . ANKLE SURGERY Left   . APPENDECTOMY    . BACK SURGERY    . BUNIONECTOMY     2 toes  . CARDIAC CATHETERIZATION  2008   Normal. Dr Acie Fredrickson.  . CHOLECYSTECTOMY    . CYST EXCISION     leg  . FLANK MASS EXCISION    . FOOT TENDON SURGERY  june 2014   both feet  . KNEE SURGERY     Bilateral.  . NOSE SURGERY    . SHOULDER SURGERY     bil.  Marland Kitchen  TOTAL ABDOMINAL HYSTERECTOMY      Prior to Admission medications   Medication Sig Start Date End Date Taking? Authorizing Provider  albuterol (PROVENTIL HFA;VENTOLIN HFA) 108 (90 Base) MCG/ACT inhaler Inhale 2 puffs into the lungs every 6 (six) hours as needed for wheezing or shortness of breath. 08/13/17   Rogue Bussing, MD  carvedilol (COREG) 3.125 MG tablet TAKE 1 TABLET BY MOUTH TWICE A DAY WITH A MEAL 05/22/17   Rogue Bussing, MD  citalopram (CELEXA) 20 MG tablet TAKE 1 TABLET BY MOUTH ONCE DAILY 09/09/17   Rogue Bussing, MD  clotrimazole (LOTRIMIN) 1 % cream  Apply 1 application topically 2 (two) times daily. Under breasts 02/20/17   Archie Patten, MD  ferrous sulfate 325 (65 FE) MG tablet Take 1 tablet (325 mg total) by mouth daily with breakfast. 08/16/17   Rogue Bussing, MD  folic acid (FOLVITE) 1 MG tablet Take 1 mg by mouth. 05/12/15   [provider]  gabapentin (NEURONTIN) 300 MG capsule TAKE 3 CAPSULES BY MOUTH TWICE A DAY 05/22/17   Rogue Bussing, MD  HYDROcodone-acetaminophen (NORCO/VICODIN) 5-325 MG tablet Take 1 tablet by mouth 3 (three) times daily as needed for moderate pain. 09/05/15   Frazier Richards, MD  methotrexate 50 MG/2ML injection  08/04/16   [provider]  minocycline (MINOCIN,DYNACIN) 100 MG capsule Take 1 capsule (100 mg total) by mouth 2 (two) times daily. 08/13/17   Rogue Bussing, MD  omeprazole (PRILOSEC) 20 MG capsule Take 1 capsule (20 mg total) by mouth daily. 06/17/17   Smiley Houseman, MD  PHENObarbital (LUMINAL) 100 MG tablet Take 1 tablet (100 mg total) by mouth daily. 08/09/17   Kathrynn Ducking, MD  tiotropium (SPIRIVA HANDIHALER) 18 MCG inhalation capsule INHALE THE CONTENTS OF 1 CAPSULE VIA HANDIHALER BY MOUTH ONCE DAILY 08/13/17   Rogue Bussing, MD  topiramate (TOPAMAX) 50 MG tablet TAKE 1 TABLET BY MOUTH IN THE AM AND 2 TABLETS BY MOUTH AT BEDTIME 09/09/17   Kathrynn Ducking, MD  triamcinolone ointment (KENALOG) 0.5 % APPLY TO AFFECTED AREAS TWICE DAILY AS DIRECTED 10/11/17   Rogue Bussing, MD    Allergies  Allergen Reactions  . Amoxicillin     REACTION: hives  . Aspirin     REACTION: rash  . Codeine Phosphate     REACTION: N/V  . Flexeril [Cyclobenzaprine] Nausea And Vomiting    Dizziness   . Ibuprofen Hives  . Penicillins     REACTION: hives  . Tetanus Toxoids Swelling    Family History  Problem Relation Age of Onset  . Cirrhosis Mother   . Diabetes Mother   . Cirrhosis Father   . Breast cancer Sister   . Breast  cancer Sister   . Colon cancer Neg Hx     Social History Social History   Tobacco Use  . Smoking status: Former Research scientist (life sciences)  . Smokeless tobacco: Former Systems developer    Quit date: 11/03/2010  Substance Use Topics  . Alcohol use: No  . Drug use: No    Review of Systems  Constitutional: Negative for fever. Eyes: Negative for visual changes. ENT: Negative for sore throat. Cardiovascular: Negative for chest pain. Respiratory: Negative for shortness of breath. Gastrointestinal: Negative for vomiting and diarrhea. Genitourinary: Negative for dysuria. Musculoskeletal: Negative for back pain. Skin: Negative for rash. Neurological: Negative for headache.  ____________________________________________   PHYSICAL EXAM:  VITAL SIGNS: ED Triage Vitals  Enc Vitals Group  BP 12/11/17 1035 116/68     Pulse Rate 12/11/17 1035 (!) 55     Resp 12/11/17 1035 18     Temp 12/11/17 1035 98.1 F (36.7 C)     Temp Source 12/11/17 1035 Oral     SpO2 12/11/17 1035 100 %     Weight 12/11/17 1033 218 lb (98.9 kg)     Height 12/11/17 1033 5\' 6"  (1.676 m)     Head Circumference --      Peak Flow --      Pain Score 12/11/17 1033 9     Pain Loc --      Pain Edu? --      Excl. in [place]? --      Constitutional: Alert and oriented. Well appearing overall but looks like she is in discomfort holding her abdomen. HEENT   Head: Normocephalic and atraumatic.      Eyes: Conjunctivae are normal. Pupils equal and round.       Ears:         Nose: No congestion/rhinnorhea.   Mouth/Throat: Mucous membranes are moist.   Neck: No stridor. Cardiovascular/Chest: Normal rate, regular rhythm.  No murmurs, rubs, or gallops. Respiratory: Normal respiratory effort without tachypnea nor retractions. Breath sounds are clear and equal bilaterally. No wheezes/rales/rhonchi. Gastrointestinal: Soft. No distention, no guarding, no rebound.  Obese.  Moderate tenderness to palpation both epigastrium and through the mid  abdomen down to the pelvis without focal McBurney's point tenderness. Genitourinary/rectal:Deferred Musculoskeletal: Nontender with normal range of motion in all extremities. No joint effusions.  No lower extremity tenderness.  No edema. Neurologic:  Normal speech and language. No gross or focal neurologic deficits are appreciated. Skin:  Skin is warm, dry and intact. No rash noted. Psychiatric: Mood and affect are normal. Speech and behavior are normal. Patient exhibits appropriate insight and judgment.   ____________________________________________  LABS (pertinent positives/negatives) I, Lisa Roca, MD the attending physician have reviewed the labs noted below.  Labs Reviewed  COMPREHENSIVE METABOLIC PANEL - Abnormal; Notable for the following components:      Result Value   Calcium 8.1 (*)    Albumin 3.2 (*)    ALT 12 (*)    All other components within normal limits  CBC - Abnormal; Notable for the following components:   Hemoglobin 10.3 (*)    HCT 33.1 (*)    MCH 25.3 (*)    MCHC 31.1 (*)    RDW 18.6 (*)    All other components within normal limits  LIPASE, BLOOD  TROPONIN I  URINALYSIS, COMPLETE (UACMP) WITH MICROSCOPIC    ____________________________________________    EKG I, Lisa Roca, MD, the attending physician have personally viewed and interpreted all ECGs.  56 bpm.  Sinus tachycardia.  Narrow QRS.  Normal axis.  Low voltage EKG.  Nonspecific flat T waves. ____________________________________________  RADIOLOGY  Radiology report: CT abdomen pelvis with contrast:  Pending __________________________________________  PROCEDURES  Procedure(s) performed: None  Critical Care performed: None   ____________________________________________  ED COURSE / ASSESSMENT AND PLAN  Pertinent labs & imaging results that were available during my care of the patient were reviewed by me and considered in my medical decision making (see chart for  details).    Unclear etiology clinically, but some of her discomfort sounds like her to gastritis.  No focal right upper quadrant tenderness.  Laboratory studies are overall reassuring.  However she still looks significantly uncomfortable diffusely in the abdomen.  She states she is a history  of diverticulitis.  We discussed risk and benefit with regard to CT scan and chose to proceed with CT imaging for further elucidation.  Patient started on symptomatic treatment.  Patient care to be transferred to Dr. Clearnce Hasten at shift change, 3 PM.  Disposition per CT result which is pending.    DIFFERENTIAL DIAGNOSIS:  Differential diagnosis includes, but is not limited to, biliary disease (biliary colic, acute cholecystitis, cholangitis, choledocholithiasis, etc), intrathoracic causes for epigastric abdominal pain including ACS, gastritis, duodenitis, pancreatitis, small bowel or large bowel obstruction, abdominal aortic aneurysm, hernia, and gastritis.  Differential diagnosis includes, but is not limited to, ovarian cyst, ovarian torsion, acute appendicitis, diverticulitis, urinary tract infection/pyelonephritis, endometriosis, bowel obstruction, colitis, renal colic, gastroenteritis, hernia, fibroids, endometriosis, pregnancy related pain including ectopic pregnancy, etc.  CONSULTATIONS:  None   Patient / Family / Caregiver informed of clinical course, medical decision-making process, and agree with plan.    ___________________________________________   FINAL CLINICAL IMPRESSION(S) / ED DIAGNOSES   Final diagnoses:  Generalized abdominal pain      ___________________________________________        Note: This dictation was prepared with Dragon dictation. Any transcriptional errors that result from this process are unintentional    Lisa Roca, MD 12/11/17 [phone]

## 2017-12-11 NOTE — ED Provider Notes (Signed)
Signout from Dr. Reita Cliche in this 67 year old female with epigastric abdominal pain radiating to the lower abdomen.  Plan is to follow-up with the CT scan which is pending at this time.  Patient has a history of gastritis.  Physical Exam  BP (!) 139/94   Pulse (!) 56   Temp 98.1 F (36.7 C) (Oral)   Resp 18   Ht 5\' 6"  (1.676 m)   Wt 98.9 kg (218 lb)   SpO2 98%   BMI 35.19 kg/m  ----------------------------------------- 5:03 PM on 12/11/2017 -----------------------------------------  Physical Exam Patient without any distress.  Appears to be resting comfortably in the bed. ED Course/Procedures     Procedures  MDM  CT scan with  uncomplicated diverticulitis.  Normal white blood cell count.  Patient will be placed on Cipro and Flagyl be given a prescription for Percocet.  Will be discharged at this time.  We discussed return precautions including any worsening or concerning symptoms especially worsening abdominal pain and fever.  We discussed if any of the symptoms were to occur the patient should return to the emergency department immediately.  She is understanding of the plan and willing to comply.       Orbie Pyo, MD 12/11/17 [phone]

## 2017-12-16 ENCOUNTER — Other Ambulatory Visit: Payer: Self-pay | Admitting: Internal Medicine

## 2017-12-16 DIAGNOSIS — G8929 Other chronic pain: Secondary | ICD-10-CM

## 2017-12-16 MED ORDER — GABAPENTIN 300 MG PO CAPS: ORAL_CAPSULE | ORAL | 5 refills | Status: DC

## 2017-12-16 NOTE — Telephone Encounter (Signed)
Needs refill on gabapentin. [facility] says they have been faxing the request for over a week

## 2018-02-03 ENCOUNTER — Other Ambulatory Visit: Payer: Self-pay | Admitting: Neurology

## 2018-02-13 ENCOUNTER — Other Ambulatory Visit: Payer: Self-pay | Admitting: Internal Medicine

## 2018-02-13 DIAGNOSIS — I10 Essential (primary) hypertension: Secondary | ICD-10-CM

## 2018-02-13 DIAGNOSIS — F329 Major depressive disorder, single episode, unspecified: Secondary | ICD-10-CM

## 2018-02-13 DIAGNOSIS — F32A Depression, unspecified: Secondary | ICD-10-CM

## 2018-02-13 MED ORDER — CARVEDILOL 3.125 MG PO TABS: ORAL_TABLET | ORAL | 2 refills | Status: DC

## 2018-02-13 MED ORDER — CITALOPRAM HYDROBROMIDE 20 MG PO TABS: ORAL_TABLET | ORAL | 2 refills | Status: DC

## 2018-02-13 NOTE — Telephone Encounter (Signed)
Pt needs refill on her citalopram and carvedilol. Please advise

## 2018-03-05 ENCOUNTER — Ambulatory Visit: Payer: Medicare Other | Admitting: Adult Health

## 2018-03-05 ENCOUNTER — Telehealth: Payer: Self-pay | Admitting: *Deleted

## 2018-03-05 NOTE — Telephone Encounter (Signed)
Patient was no show for follow up with NP today.  

## 2018-03-06 ENCOUNTER — Encounter: Payer: Self-pay | Admitting: Adult Health

## 2018-04-07 ENCOUNTER — Telehealth: Payer: Self-pay | Admitting: Internal Medicine

## 2018-04-07 MED ORDER — CLOTRIMAZOLE 1 % EX CREA: 1.0000 "application " | TOPICAL_CREAM | Freq: Two times a day (BID) | CUTANEOUS | 1 refills | Status: DC

## 2018-04-07 NOTE — Telephone Encounter (Signed)
Daughter is calling and would like for the doctor to call in the cream that her mother uses for the rash under her breast. Blima Rich

## 2018-04-07 NOTE — Telephone Encounter (Signed)
Left message for patient's daughter that I ordered clotrimazole cream.   Olene Floss, MD [place], PGY-3

## 2018-04-08 ENCOUNTER — Telehealth: Payer: Self-pay | Admitting: Internal Medicine

## 2018-04-08 NOTE — Telephone Encounter (Signed)
Please ask patient to make an appointment to be evaluated in clinic. Thank you!  Olene Floss, MD [place], PGY-3

## 2018-04-08 NOTE — Telephone Encounter (Signed)
Pt would like something called in for her bronchitis again. Her cough has come back

## 2018-04-10 NOTE — Telephone Encounter (Signed)
Called patient to schedule and appointment to be reevaluated for recurring cough but there was no answer and the mailbox was full.  If patient should happen to call back after seeing our number, please schedule an appointment.  Thanks .Ozella Almond, CMA

## 2018-04-11 ENCOUNTER — Other Ambulatory Visit: Payer: Self-pay | Admitting: Internal Medicine

## 2018-04-11 MED ORDER — TRIAMCINOLONE ACETONIDE 0.5 % EX OINT: TOPICAL_OINTMENT | CUTANEOUS | 0 refills | Status: DC

## 2018-04-11 NOTE — Telephone Encounter (Signed)
Pt's daughter called and said that pt needs triamcinolone refilled. Her daughter said she has been trying to have it refilled for two weeks and that pt needs it asap.

## 2018-05-14 ENCOUNTER — Other Ambulatory Visit: Payer: Self-pay | Admitting: Neurology

## 2018-05-28 ENCOUNTER — Other Ambulatory Visit: Payer: Self-pay

## 2018-05-28 DIAGNOSIS — I10 Essential (primary) hypertension: Secondary | ICD-10-CM

## 2018-05-28 MED ORDER — CARVEDILOL 3.125 MG PO TABS: ORAL_TABLET | ORAL | 1 refills | Status: DC

## 2018-05-28 NOTE — Telephone Encounter (Signed)
Please refill as soon as possible. Patient out x a week. States pharmacy has been trying to contact but no record of that.   Danley Danker, RN [facility] RN)

## 2018-06-06 ENCOUNTER — Other Ambulatory Visit: Payer: Self-pay | Admitting: Internal Medicine

## 2018-06-09 ENCOUNTER — Other Ambulatory Visit: Payer: Self-pay

## 2018-06-09 DIAGNOSIS — F32A Depression, unspecified: Secondary | ICD-10-CM

## 2018-06-09 DIAGNOSIS — F329 Major depressive disorder, single episode, unspecified: Secondary | ICD-10-CM

## 2018-06-10 ENCOUNTER — Emergency Department
Admission: EM | Admit: 2018-06-10 | Discharge: 2018-06-10 | Disposition: A | Discharge status: Home / Self Care | Payer: Medicare Other | Attending: Emergency Medicine | Admitting: Emergency Medicine

## 2018-06-10 ENCOUNTER — Encounter: Payer: Self-pay | Admitting: Emergency Medicine

## 2018-06-10 ENCOUNTER — Emergency Department: Payer: Medicare Other

## 2018-06-10 ENCOUNTER — Other Ambulatory Visit: Payer: Self-pay

## 2018-06-10 DIAGNOSIS — Z87891 Personal history of nicotine dependence: Secondary | ICD-10-CM | POA: Insufficient documentation

## 2018-06-10 DIAGNOSIS — R1010 Upper abdominal pain, unspecified: Secondary | ICD-10-CM | POA: Diagnosis not present

## 2018-06-10 DIAGNOSIS — Z79899 Other long term (current) drug therapy: Secondary | ICD-10-CM | POA: Diagnosis not present

## 2018-06-10 DIAGNOSIS — I10 Essential (primary) hypertension: Secondary | ICD-10-CM | POA: Insufficient documentation

## 2018-06-10 DIAGNOSIS — Z8673 Personal history of transient ischemic attack (TIA), and cerebral infarction without residual deficits: Secondary | ICD-10-CM | POA: Insufficient documentation

## 2018-06-10 DIAGNOSIS — R140 Abdominal distension (gaseous): Secondary | ICD-10-CM | POA: Diagnosis not present

## 2018-06-10 DIAGNOSIS — J449 Chronic obstructive pulmonary disease, unspecified: Secondary | ICD-10-CM | POA: Insufficient documentation

## 2018-06-10 DIAGNOSIS — R110 Nausea: Secondary | ICD-10-CM | POA: Diagnosis not present

## 2018-06-10 HISTORY — DX: Systemic involvement of connective tissue, unspecified: M35.9

## 2018-06-10 LAB — CBC
HCT: 34.1 % — ABNORMAL LOW (ref 35.0–47.0)
HEMOGLOBIN: 11 g/dL — AB (ref 12.0–16.0)
MCH: 26.3 pg (ref 26.0–34.0)
MCHC: 32.4 g/dL (ref 32.0–36.0)
MCV: 81.3 fL (ref 80.0–100.0)
Platelets: 207 10*3/uL (ref 150–440)
RBC: 4.19 MIL/uL (ref 3.80–5.20)
RDW: 16.2 % — ABNORMAL HIGH (ref 11.5–14.5)
WBC: 4.7 10*3/uL (ref 3.6–11.0)

## 2018-06-10 LAB — COMPREHENSIVE METABOLIC PANEL
ALK PHOS: 92 U/L (ref 38–126)
ALT: 13 U/L (ref 0–44)
ANION GAP: 4 — AB (ref 5–15)
AST: 23 U/L (ref 15–41)
Albumin: 3.6 g/dL (ref 3.5–5.0)
BILIRUBIN TOTAL: 0.4 mg/dL (ref 0.3–1.2)
BUN: 12 mg/dL (ref 8–23)
CALCIUM: 8.4 mg/dL — AB (ref 8.9–10.3)
CO2: 25 mmol/L (ref 22–32)
Chloride: 112 mmol/L — ABNORMAL HIGH (ref 98–111)
Creatinine, Ser: 0.75 mg/dL (ref 0.44–1.00)
GFR calc non Af Amer: >60 mL/min (ref >60)
GLUCOSE: 96 mg/dL (ref 70–99)
Potassium: 4.5 mmol/L (ref 3.5–5.1)
Sodium: 141 mmol/L (ref 135–145)
TOTAL PROTEIN: 7.9 g/dL (ref 6.5–8.1)

## 2018-06-10 LAB — URINALYSIS, COMPLETE (UACMP) WITH MICROSCOPIC
Bacteria, UA: NONE SEEN
Bilirubin Urine: NEGATIVE
GLUCOSE, UA: NEGATIVE mg/dL
HGB URINE DIPSTICK: NEGATIVE
Ketones, ur: NEGATIVE mg/dL
Leukocytes, UA: NEGATIVE
NITRITE: NEGATIVE
PH: 6 (ref 5.0–8.0)
PROTEIN: NEGATIVE mg/dL
Specific Gravity, Urine: 1.019 (ref 1.005–1.030)

## 2018-06-10 LAB — LIPASE, BLOOD: Lipase: 24 U/L (ref 11–51)

## 2018-06-10 MED ORDER — IOPAMIDOL (ISOVUE-300) INJECTION 61%
100.0000 mL | Freq: Once | INTRAVENOUS | Status: AC | PRN
  Administered 2018-06-10: 100 mL via INTRAVENOUS

## 2018-06-10 MED ORDER — GI COCKTAIL ~~LOC~~
30.0000 mL | Freq: Once | ORAL | Status: AC
  Administered 2018-06-10: 30 mL via ORAL
  Filled 2018-06-10: qty 30

## 2018-06-10 MED ORDER — ONDANSETRON HCL 4 MG/2ML IJ SOLN
4.0000 mg | Freq: Once | INTRAMUSCULAR | Status: AC
  Administered 2018-06-10: 4 mg via INTRAVENOUS
  Filled 2018-06-10: qty 2

## 2018-06-10 MED ORDER — IOPAMIDOL (ISOVUE-300) INJECTION 61%
30.0000 mL | Freq: Once | INTRAVENOUS | Status: AC | PRN
  Administered 2018-06-10: 30 mL via ORAL

## 2018-06-10 MED ORDER — CITALOPRAM HYDROBROMIDE 20 MG PO TABS: ORAL_TABLET | ORAL | 2 refills | Status: DC

## 2018-06-10 MED ORDER — ONDANSETRON 4 MG PO TBDP: 4.0000 mg | ORAL_TABLET | Freq: Three times a day (TID) | ORAL | 0 refills | Status: DC | PRN

## 2018-06-10 MED ORDER — SODIUM CHLORIDE 0.9 % IV BOLUS
1000.0000 mL | Freq: Once | INTRAVENOUS | Status: AC
  Administered 2018-06-10: 1000 mL via INTRAVENOUS

## 2018-06-10 MED ORDER — FENTANYL CITRATE (PF) 100 MCG/2ML IJ SOLN
100.0000 ug | Freq: Once | INTRAMUSCULAR | Status: AC
  Administered 2018-06-10: 100 ug via INTRAVENOUS
  Filled 2018-06-10: qty 2

## 2018-06-10 NOTE — ED Notes (Signed)
Pt reporting she can not drink the second bottle of oral contrast. CT made aware.

## 2018-06-10 NOTE — ED Triage Notes (Addendum)
Pt to ED via POV with c/o upper abd pain x3 days. Pt denies any bloody stool or vomit. VSS. Pt appears uncomfortable

## 2018-06-10 NOTE — ED Provider Notes (Addendum)
[facility] Emergency Department Provider Note  Time seen: 2:25 PM  I have reviewed the triage vital signs and the nursing notes.   HISTORY  Chief Complaint Abdominal Pain    HPI [name] is a 67 y.o. female with a past medical history of chronic back pain, COPD, fibromyalgia, hypertension, hyperlipidemia, presents to the emergency department for upper abdominal discomfort.  According to the patient for the past 2 days she has had progressively worsening upper abdominal discomfort and abdominal distention.  Is not entirely sure when her last bowel movement was.  Denies any diarrhea, states nausea but denies any vomiting.  No chest pain or trouble breathing.  No dysuria or hematuria.  Describes her abdominal pain is 8/10 moderate aching pain across the upper abdomen.  Denies alcohol use.  States she is status post cholecystectomy.   Past Medical History:  Diagnosis Date  . Asthma   . Chronic low back pain   . 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  . COPD (chronic obstructive pulmonary disease) ([place])   . Depression   . Fibromyalgia    08/18/15  [facility]  . GERD (gastroesophageal reflux disease)   . History of CVA (cerebrovascular accident)    "35 minni srokes"  . Hyperlipidemia   . Hypertension   . Migraine   . Personal history of colonic polyp - adenoma 03/01/2014  . Personality disorder ([place])   . Rheumatoid arthritis ([place])    08/18/15  [facility]  . Seizures ([place])    last seizure 4 years ago  . Sleep apnea    Mild. No need for CPAP.  Marland Kitchen Stroke [facility])     Patient Active Problem List   Diagnosis Date Noted  . Nasal congestion 08/15/2017  . Hypersomnia with sleep apnea 04/18/2017  . Snoring 04/18/2017  . Post traumatic stress disorder (PTSD) 04/18/2017  . COPD mixed type ([place]) 04/18/2017  . Somnolence, daytime 02/26/2017  . Cervical paraspinal muscle spasm 10/26/2016  . Lower back pain 05/27/2016  . Left hip pain  05/27/2016  . Fibromyalgia 08/18/2015  . Rheumatoid arthritis ([place]) 03/09/2015  . Essential hypertension, benign 02/18/2015  . Right knee pain 02/18/2015  . Cervical disc disorder with radiculopathy of cervical region 01/21/2015  . Bilateral arm pain 01/13/2015  . After cataract 07/22/2014  . Personal history of colonic polyp - adenoma 03/01/2014  . Candidal intertrigo 12/29/2013  . Dyshidrotic eczema 12/29/2013  . Routine adult health maintenance 12/29/2013  . 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 Frequent falls 02/10/2013  . Generalized pain 08/30/2012  . STROKE 07/29/2008  . SEIZURE DISORDER 07/29/2008  . Migraine without aura 07/29/2007  . DEPENDENCE, BARB/SED, CONTINUOUS 06/24/2007  . HYPERLIPIDEMIA 12/19/2006  . OBESITY, NOS 12/19/2006  . DEPRESSION, MAJOR, RECURRENT 12/19/2006  . PANIC ATTACKS 12/19/2006  . 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 BORDERLINE PERSONALITY 12/19/2006  . COPD 12/19/2006  . REFLUX ESOPHAGITIS 12/19/2006    Past Surgical History:  Procedure Laterality Date  . ANKLE SURGERY Left   . APPENDECTOMY    . BACK SURGERY    . BUNIONECTOMY     2 toes  . CARDIAC CATHETERIZATION  2008   Normal. Dr Acie Fredrickson.  . CHOLECYSTECTOMY    . CYST EXCISION     leg  . FLANK MASS EXCISION    . FOOT TENDON SURGERY  june 2014   both feet  . KNEE SURGERY     Bilateral.  . NOSE SURGERY    .  SHOULDER SURGERY     bil.  Marland Kitchen TOTAL ABDOMINAL HYSTERECTOMY      Prior to Admission medications   Medication Sig Start Date End Date Taking? Authorizing Provider  albuterol (PROVENTIL HFA;VENTOLIN HFA) 108 (90 Base) MCG/ACT inhaler Inhale 2 puffs into the lungs every 6 (six) hours as needed for wheezing or shortness of breath. 08/13/17   Rogue Bussing, MD  carvedilol (COREG) 3.125 MG tablet TAKE 1 TABLET BY MOUTH TWICE A DAY WITH A MEAL 05/28/18   Lovenia Kim, MD  citalopram (CELEXA) 20 MG tablet TAKE 1 TABLET BY MOUTH ONCE DAILY 06/10/18   Lovenia Kim,  MD  clotrimazole (LOTRIMIN) 1 % cream Apply 1 application topically 2 (two) times daily. Under breasts 04/07/18   Rogue Bussing, MD  ferrous sulfate 325 (65 FE) MG tablet Take 1 tablet (325 mg total) by mouth daily with breakfast. 08/16/17   Rogue Bussing, MD  folic acid (FOLVITE) 1 MG tablet Take 1 mg by mouth. 05/12/15   [provider]  gabapentin (NEURONTIN) 300 MG capsule TAKE 3 CAPSULES BY MOUTH TWICE A DAY 12/16/17   Rogue Bussing, MD  HYDROcodone-acetaminophen (NORCO/VICODIN) 5-325 MG tablet Take 1 tablet by mouth 3 (three) times daily as needed for moderate pain. 09/05/15   Frazier Richards, MD  methotrexate 50 MG/2ML injection  08/04/16   [provider]  minocycline (MINOCIN,DYNACIN) 100 MG capsule Take 1 capsule (100 mg total) by mouth 2 (two) times daily. 08/13/17   Rogue Bussing, MD  omeprazole (PRILOSEC) 20 MG capsule TAKE 1 CAPSULE BY MOUTH ONCE DAILY 06/08/18   Lovenia Kim, MD  oxyCODONE-acetaminophen (PERCOCET/ROXICET) 5-325 MG tablet Take 1-2 tablets by mouth every 6 (six) hours as needed for moderate pain or severe pain. 12/11/17   Orbie Pyo, MD  PHENObarbital (LUMINAL) 100 MG tablet TAKE 1 TABLET BY MOUTH ONCE DAILY 02/03/18   Kathrynn Ducking, MD  tiotropium (SPIRIVA HANDIHALER) 18 MCG inhalation capsule INHALE THE CONTENTS OF 1 CAPSULE VIA HANDIHALER BY MOUTH ONCE DAILY 08/13/17   Rogue Bussing, MD  topiramate (TOPAMAX) 50 MG tablet TAKE 1 TABLET BY MOUTH IN THE MORNING AND 2 TABLETS BY MOUTH AT BEDTIME 05/14/18   Kathrynn Ducking, MD  triamcinolone ointment (KENALOG) 0.5 % APPLY TO AFFECTED AREAS TWICE DAILY AS DIRECTED 04/11/18   Rogue Bussing, MD    Allergies  Allergen Reactions  . Amoxicillin     REACTION: hives  . Aspirin     REACTION: rash  . Codeine Phosphate     REACTION: N/V  . Flexeril [Cyclobenzaprine] Nausea And Vomiting    Dizziness   . Ibuprofen Hives  .  Penicillins     REACTION: hives  . Tetanus Toxoids Swelling    Family History  Problem Relation Age of Onset  . Cirrhosis Mother   . Diabetes Mother   . Cirrhosis Father   . Breast cancer Sister   . Breast cancer Sister   . Colon cancer Neg Hx     Social History Social History   Tobacco Use  . Smoking status: Former Research scientist (life sciences)  . Smokeless tobacco: Former Systems developer    Quit date: 11/03/2010  Substance Use Topics  . Alcohol use: No  . Drug use: No    Review of Systems Constitutional: Negative for fever. Eyes: Negative for visual complaints ENT: Negative for recent illness/congestion Cardiovascular: Negative for chest pain. Respiratory: Negative for shortness of breath. Gastrointestinal: Moderate upper abdominal pain positive for nausea.  Negative for vomiting or diarrhea Genitourinary: Negative for urinary compaints Musculoskeletal: Negative for musculoskeletal complaints Skin: Negative for skin complaints  Neurological: Negative for headache All other ROS negative  ____________________________________________   PHYSICAL EXAM:  VITAL SIGNS: ED Triage Vitals  Enc Vitals Group     BP 06/10/18 1235 (!) 124/93     Pulse Rate 06/10/18 1235 72     Resp 06/10/18 1235 18     Temp 06/10/18 1235 98.5 F (36.9 C)     Temp Source 06/10/18 1235 Oral     SpO2 06/10/18 1235 96 %     Weight 06/10/18 1235 235 lb (106.6 kg)     Height 06/10/18 1235 5\' 6"  (1.676 m)     Head Circumference --      Peak Flow --      Pain Score 06/10/18 1239 9     Pain Loc --      Pain Edu? --      Excl. in [place]? --    Constitutional: Alert and oriented. Well appearing and in no distress. Eyes: Normal exam ENT   Head: Normocephalic and atraumatic.   Mouth/Throat: Mucous membranes are moist. Cardiovascular: Normal rate, regular rhythm. No murmur Respiratory: Normal respiratory effort without tachypnea nor retractions. Breath sounds are clear  Gastrointestinal: Mild distention was somewhat  tympanic to percussion, moderate tenderness across the upper abdomen with mild fairly diffuse tenderness without obvious rebound or guarding. Musculoskeletal: Nontender with normal range of motion in all extremities.  Neurologic:  Normal speech and language. No gross focal neurologic deficits Skin:  Skin is warm, dry and intact.  Psychiatric: Mood and affect are normal.   ____________________________________________    EKG  EKG reviewed and interpreted by myself shows normal sinus rhythm at 74 bpm with a narrow QRS, normal axis, normal intervals, nonspecific ST changes, largely unchanged from prior.  ____________________________________________    RADIOLOGY  CT scan pending  ____________________________________________   INITIAL IMPRESSION / ASSESSMENT AND PLAN / ED COURSE  Pertinent labs & imaging results that were available during my care of the patient were reviewed by me and considered in my medical decision making (see chart for details).  Patient presents to the emergency department for upper abdominal discomfort worsening over the past 2 days with nausea.  Differential would include pancreatitis, hepatic steatosis, gastritis, gastric or peptic ulcers, small bowel obstruction.  Patient's labs are resulted largely within normal limits with a normal white blood cell count largely normal chemistry including normal LFTs and a normal lipase.  Urinalysis is normal as well.  Given the patient's mild abdominal distention with moderate upper abdominal tenderness we will treat her pain, treat nausea, IV hydrate and obtain CT imaging the abdomen to further evaluate and rule out interabdominal pathology such as SBO.  CT scan pending patient care signed out to oncoming physician.  CT scan is negative for acute abnormality.  We will discharge patient with a short course of pain medication have her follow-up with her primary care doctor.  Patient agreeable to this plan of  care.  ____________________________________________   FINAL CLINICAL IMPRESSION(S) / ED DIAGNOSES  Upper abdominal pain    Harvest Dark, MD 06/10/18 1429    Harvest Dark, MD 06/10/18 1530

## 2018-06-26 ENCOUNTER — Ambulatory Visit (INDEPENDENT_AMBULATORY_CARE_PROVIDER_SITE_OTHER): Payer: Medicare Other | Admitting: Gastroenterology

## 2018-06-26 ENCOUNTER — Encounter: Payer: Self-pay | Admitting: Gastroenterology

## 2018-06-26 ENCOUNTER — Telehealth: Payer: Self-pay | Admitting: Gastroenterology

## 2018-06-26 ENCOUNTER — Other Ambulatory Visit: Payer: Self-pay

## 2018-06-26 VITALS — BP 126/90 | HR 75 | Ht 66.0 in | Wt 224.0 lb

## 2018-06-26 DIAGNOSIS — G8929 Other chronic pain: Secondary | ICD-10-CM

## 2018-06-26 DIAGNOSIS — R1013 Epigastric pain: Secondary | ICD-10-CM

## 2018-06-26 MED ORDER — SUCRALFATE 1 GM/10ML PO SUSP: 1.0000 g | Freq: Four times a day (QID) | ORAL | 1 refills | Status: DC

## 2018-06-26 MED ORDER — SUCRALFATE 1 G PO TABS: 1.0000 g | ORAL_TABLET | Freq: Four times a day (QID) | ORAL | 1 refills | Status: DC

## 2018-06-26 NOTE — Progress Notes (Signed)
Jonathon Bellows MD, MRCP(U.K) [address]  [place]  [place], [place] [address]  Main: [phone]  Fax: [phone]   Gastroenterology Consultation  Referring Provider:     Lovenia Kim, MD Primary Care Physician:  Lovenia Kim, MD Primary Gastroenterologist:  Dr. Jonathon Bellows  Reason for Consultation:     Abdominal pain         HPI:   [name] is a 67 y.o. y/o female referred for consultation & management  by Dr. Lovenia Kim, MD.    She has been referred to see me by the emergency room.  She visited the emergency room on 06/10/2018 with a complaint of abdominal pain.  It had began 2 days prior to her presentation.  He has had a cholecystectomy in the past.  Her medical records suggest she is on oxycodone.  And a CT scan of the abdomen which showed no acute abnormality.  Colonic diverticulosis was noted.  Discharged.  Do note a similar CT scan performed in February 2019 again showing cardiomegaly and acute uncomplicated descending colon diverticulitis.  Last colonoscopy was in May 2015.  Performed by Dr. Arelia Longest.  At that point of time she had severe diverticulosis of the sigmoid colon.  Diminutive sessile was found in the ascending colon and was taken out.  Culture shows that it is an adenoma.  Abs performed at the emergency room suggest a normal urinalysis, hemoglobin of 11 g and MCV of 81.  Hemoglobin is 10.5 g a year prior.  Creatinine of 0.75.  Lipase of 24.  Iron studies performed 10 months back shows an iron percentage saturation of 9, normal B12 at 483.  Copper.  Written was 31.   She says that abdominal pain began a few weeks back , got worse when she went to the ER, eating or drinking makes it worse, points to the center and upper part of the abdomen. She also has nausea and vomiting , no blood. ?fever. Occasional diarrhea. She has a bowel movement daily- soft  . 3 x a day , hursts when she goes, pain no better after a bowel movement. No NSAID's.She takes  hydrocodone for fibromyalgia.   She takes prilosec - daily - not helped Past Medical History:  Diagnosis Date  . Asthma   . Chronic low back pain   . Collagen vascular disease ([place])   . 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  . COPD (chronic obstructive pulmonary disease) ([place])   . Depression   . Fibromyalgia    08/18/15  [facility]  . GERD (gastroesophageal reflux disease)   . History of CVA (cerebrovascular accident)    "41 minni srokes"  . Hyperlipidemia   . Hypertension   . Migraine   . Personal history of colonic polyp - adenoma 03/01/2014  . Personality disorder ([place])   . Rheumatoid arthritis ([place])    08/18/15  [facility]  . Seizures ([place])    last seizure 4 years ago  . Sleep apnea    Mild. No need for CPAP.  Marland Kitchen Stroke [facility])     Past Surgical History:  Procedure Laterality Date  . ANKLE SURGERY Left   . APPENDECTOMY    . BACK SURGERY    . BUNIONECTOMY     2 toes  . CARDIAC CATHETERIZATION  2008   Normal. Dr Acie Fredrickson.  . CHOLECYSTECTOMY    . CYST EXCISION     leg  . FLANK MASS EXCISION    . FOOT  TENDON SURGERY  june 2014   both feet  . KNEE SURGERY     Bilateral.  . NOSE SURGERY    . SHOULDER SURGERY     bil.  Marland Kitchen TOTAL ABDOMINAL HYSTERECTOMY      Prior to Admission medications   Medication Sig Start Date End Date Taking? Authorizing Provider  albuterol (PROVENTIL HFA;VENTOLIN HFA) 108 (90 Base) MCG/ACT inhaler Inhale 2 puffs into the lungs every 6 (six) hours as needed for wheezing or shortness of breath. 08/13/17   Rogue Bussing, MD  carvedilol (COREG) 3.125 MG tablet TAKE 1 TABLET BY MOUTH TWICE A DAY WITH A MEAL 05/28/18   Lovenia Kim, MD  citalopram (CELEXA) 20 MG tablet TAKE 1 TABLET BY MOUTH ONCE DAILY 06/10/18   Lovenia Kim, MD  clotrimazole (LOTRIMIN) 1 % cream Apply 1 application topically 2 (two) times daily. Under breasts 04/07/18   Rogue Bussing, MD  ferrous sulfate 325 (65 FE) MG tablet Take 1 tablet  (325 mg total) by mouth daily with breakfast. 08/16/17   Rogue Bussing, MD  folic acid (FOLVITE) 1 MG tablet Take 1 mg by mouth. 05/12/15   [provider]  gabapentin (NEURONTIN) 300 MG capsule TAKE 3 CAPSULES BY MOUTH TWICE A DAY 12/16/17   Rogue Bussing, MD  HYDROcodone-acetaminophen (NORCO/VICODIN) 5-325 MG tablet Take 1 tablet by mouth 3 (three) times daily as needed for moderate pain. 09/05/15   Frazier Richards, MD  methotrexate 50 MG/2ML injection  08/04/16   [provider]  minocycline (MINOCIN,DYNACIN) 100 MG capsule Take 1 capsule (100 mg total) by mouth 2 (two) times daily. 08/13/17   Rogue Bussing, MD  omeprazole (PRILOSEC) 20 MG capsule TAKE 1 CAPSULE BY MOUTH ONCE DAILY 06/08/18   Lovenia Kim, MD  ondansetron (ZOFRAN ODT) 4 MG disintegrating tablet Take 1 tablet (4 mg total) by mouth every 8 (eight) hours as needed for nausea or vomiting. 06/10/18   Harvest Dark, MD  oxyCODONE-acetaminophen (PERCOCET/ROXICET) 5-325 MG tablet Take 1-2 tablets by mouth every 6 (six) hours as needed for moderate pain or severe pain. 12/11/17   Orbie Pyo, MD  PHENObarbital (LUMINAL) 100 MG tablet TAKE 1 TABLET BY MOUTH ONCE DAILY 02/03/18   Kathrynn Ducking, MD  tiotropium (SPIRIVA HANDIHALER) 18 MCG inhalation capsule INHALE THE CONTENTS OF 1 CAPSULE VIA HANDIHALER BY MOUTH ONCE DAILY 08/13/17   Rogue Bussing, MD  topiramate (TOPAMAX) 50 MG tablet TAKE 1 TABLET BY MOUTH IN THE MORNING AND 2 TABLETS BY MOUTH AT BEDTIME 05/14/18   Kathrynn Ducking, MD  triamcinolone ointment (KENALOG) 0.5 % APPLY TO AFFECTED AREAS TWICE DAILY AS DIRECTED 04/11/18   Rogue Bussing, MD    Family History  Problem Relation Age of Onset  . Cirrhosis Mother   . Diabetes Mother   . Cirrhosis Father   . Breast cancer Sister   . Breast cancer Sister   . Colon cancer Neg Hx      Social History   Tobacco Use  . Smoking status: Former  Research scientist (life sciences)  . Smokeless tobacco: Former Systems developer    Quit date: 11/03/2010  Substance Use Topics  . Alcohol use: No  . Drug use: No    Allergies as of 06/26/2018 - Review Complete 06/10/2018  Allergen Reaction Noted  . Amoxicillin  11/06/2005  . Aspirin  06/14/2006  . Codeine phosphate  11/06/2005  . Flexeril [cyclobenzaprine] Nausea And Vomiting 07/31/2013  . Ibuprofen Hives 08/11/2014  . Penicillins  11/06/2005  . Tetanus toxoids Swelling 01/15/2012    Review of Systems:    All systems reviewed and negative except where noted in HPI.   Physical Exam:  There were no vitals taken for this visit. No LMP recorded. Patient has had a hysterectomy. Psych:  Alert and cooperative. Normal mood and affect. General:   Alert,  Well-developed, well-nourished, pleasant and cooperative in NAD Head:  Normocephalic and atraumatic. Eyes:  Sclera clear, no icterus.   Conjunctiva pink. Ears:  Normal auditory acuity. Nose:  No deformity, discharge, or lesions. Mouth:  No deformity or lesions,oropharynx pink & moist. Neck:  Supple; no masses or thyromegaly. Lungs:  Respirations even and unlabored.  Clear throughout to auscultation.   No wheezes, crackles, or rhonchi. No acute distress. Heart:  Regular rate and rhythm; no murmurs, clicks, rubs, or gallops. Abdomen:  Normal bowel sounds.  No bruits.  Soft, epigastric tenderness ,non -distended without masses, hepatosplenomegaly or hernias noted.  No guarding or rebound tenderness.    Neurologic:  Alert and oriented x3;  grossly normal neurologically. Skin:  Intact without significant lesions or rashes. No jaundice. Lymph Nodes:  No significant cervical adenopathy. Psych:  Alert and cooperative. Normal mood and affect.  Imaging Studies: Ct Abdomen Pelvis W Contrast  Result Date: 06/10/2018 CLINICAL DATA:  Progressively worsening upper abdominal pain and distention with nausea for 2 days. EXAM: CT ABDOMEN AND PELVIS WITH CONTRAST TECHNIQUE: Multidetector CT  imaging of the abdomen and pelvis was performed using the standard protocol following bolus administration of intravenous contrast. CONTRAST:  100 mL Isovue 300 COMPARISON:  12/11/2017 FINDINGS: Lower chest: Minimal atelectasis or scarring in the lung bases. No pleural effusion. Hepatobiliary: No focal liver abnormality is seen. Status post cholecystectomy. No biliary dilatation. Pancreas: Unremarkable. Spleen: Unchanged punctate calcified splenic granuloma. Adrenals/Urinary Tract: Unremarkable adrenal glands. No evidence of renal calculi or hydronephrosis. Unchanged subcentimeter low-density lesion in the left kidney, too small to fully characterize. Unremarkable bladder. Stomach/Bowel: The stomach is within normal limits. Scattered diverticulosis is noted throughout the colon without evidence of diverticulitis. There is no bowel obstruction. Prior appendectomy. Vascular/Lymphatic: Abdominal aortic atherosclerosis without aneurysm. No enlarged lymph nodes. Reproductive: Status post hysterectomy. No adnexal masses. Other: No intraperitoneal free fluid. No abdominal wall hernia. Musculoskeletal: Mild lumbar spondylosis and moderate facet arthrosis. IMPRESSION: 1. No acute abnormality identified in the abdomen or pelvis. 2. Colonic diverticulosis. 3.  Aortic Atherosclerosis (ICD10-I70.0). Electronically Signed   By: Logan Bores M.D.   On: 06/10/2018 15:25    Assessment and Plan:   [name] is a 67 y.o. y/o female has been referred for  abdominal pain.  Looking back at her labs she does have a microcytic anemia going back 3 years..  Iron studies performed in 2018 did not clearly show an iron deficiency anemia possibly a combination of bit of low iron and anemia of chronic disease. Present issue is abdominal pain and she has tenderness in th eEpigastric area ?peptic ulcer?  Plan 1.Hydrogen breath test for H pylori 2 . EGD tomorrow  3. Carafate PRN  I have discussed alternative options, risks &  benefits,  which include, but are not limited to, bleeding, infection, perforation,respiratory complication & drug reaction.  The patient agrees with this plan & written consent will be obtained.      Follow up in 4 weeks  Dr Jonathon Bellows MD,MRCP(U.K)

## 2018-06-26 NOTE — Telephone Encounter (Signed)
Gerald Stabs with [facility] left vm in regards to pts rx Carafade 12ml 4 times a day it is not covered under pt insurance and he would like to see if you want to call in Carafade tablets instead [phone]

## 2018-06-27 ENCOUNTER — Ambulatory Visit: Payer: Medicare Other | Admitting: Certified Registered Nurse Anesthetist

## 2018-06-27 ENCOUNTER — Encounter: Payer: Self-pay | Admitting: Certified Registered Nurse Anesthetist

## 2018-06-27 ENCOUNTER — Encounter
Admission: RE | Disposition: A | Discharge status: Home / Self Care | Payer: Self-pay | Source: Ambulatory Visit | Attending: Gastroenterology

## 2018-06-27 ENCOUNTER — Ambulatory Visit
Admission: RE | Admit: 2018-06-27 | Discharge: 2018-06-27 | Disposition: A | Discharge status: Home / Self Care | Payer: Medicare Other | Source: Ambulatory Visit | Attending: Gastroenterology | Admitting: Gastroenterology

## 2018-06-27 DIAGNOSIS — Z8673 Personal history of transient ischemic attack (TIA), and cerebral infarction without residual deficits: Secondary | ICD-10-CM | POA: Insufficient documentation

## 2018-06-27 DIAGNOSIS — J449 Chronic obstructive pulmonary disease, unspecified: Secondary | ICD-10-CM | POA: Insufficient documentation

## 2018-06-27 DIAGNOSIS — G4730 Sleep apnea, unspecified: Secondary | ICD-10-CM | POA: Insufficient documentation

## 2018-06-27 DIAGNOSIS — Z87891 Personal history of nicotine dependence: Secondary | ICD-10-CM | POA: Diagnosis not present

## 2018-06-27 DIAGNOSIS — K219 Gastro-esophageal reflux disease without esophagitis: Secondary | ICD-10-CM | POA: Diagnosis not present

## 2018-06-27 DIAGNOSIS — F329 Major depressive disorder, single episode, unspecified: Secondary | ICD-10-CM | POA: Diagnosis not present

## 2018-06-27 DIAGNOSIS — Z7982 Long term (current) use of aspirin: Secondary | ICD-10-CM | POA: Insufficient documentation

## 2018-06-27 DIAGNOSIS — M069 Rheumatoid arthritis, unspecified: Secondary | ICD-10-CM | POA: Insufficient documentation

## 2018-06-27 DIAGNOSIS — R1013 Epigastric pain: Secondary | ICD-10-CM

## 2018-06-27 DIAGNOSIS — E785 Hyperlipidemia, unspecified: Secondary | ICD-10-CM | POA: Insufficient documentation

## 2018-06-27 DIAGNOSIS — G8929 Other chronic pain: Secondary | ICD-10-CM

## 2018-06-27 DIAGNOSIS — K2950 Unspecified chronic gastritis without bleeding: Secondary | ICD-10-CM | POA: Insufficient documentation

## 2018-06-27 DIAGNOSIS — M797 Fibromyalgia: Secondary | ICD-10-CM | POA: Diagnosis not present

## 2018-06-27 DIAGNOSIS — I10 Essential (primary) hypertension: Secondary | ICD-10-CM | POA: Insufficient documentation

## 2018-06-27 DIAGNOSIS — Z79899 Other long term (current) drug therapy: Secondary | ICD-10-CM | POA: Diagnosis not present

## 2018-06-27 HISTORY — PX: ESOPHAGOGASTRODUODENOSCOPY (EGD) WITH PROPOFOL: SHX5813

## 2018-06-27 SURGERY — ESOPHAGOGASTRODUODENOSCOPY (EGD) WITH PROPOFOL
Anesthesia: General

## 2018-06-27 MED ORDER — LIDOCAINE HCL (CARDIAC) PF 100 MG/5ML IV SOSY
PREFILLED_SYRINGE | INTRAVENOUS | Status: DC | PRN
  Administered 2018-06-27: 50 mg via INTRAVENOUS

## 2018-06-27 MED ORDER — SODIUM CHLORIDE 0.9 % IV SOLN
INTRAVENOUS | Status: DC
  Administered 2018-06-27: 1000 mL via INTRAVENOUS

## 2018-06-27 MED ORDER — PROPOFOL 10 MG/ML IV BOLUS
INTRAVENOUS | Status: DC | PRN
  Administered 2018-06-27: 70 mg via INTRAVENOUS

## 2018-06-27 MED ORDER — PROPOFOL 500 MG/50ML IV EMUL
INTRAVENOUS | Status: DC | PRN
  Administered 2018-06-27: 175 ug/kg/min via INTRAVENOUS

## 2018-06-27 MED ORDER — PROPOFOL 500 MG/50ML IV EMUL
INTRAVENOUS | Status: AC
  Filled 2018-06-27: qty 50

## 2018-06-27 MED ORDER — LIDOCAINE HCL (PF) 2 % IJ SOLN
INTRAMUSCULAR | Status: AC
  Filled 2018-06-27: qty 10

## 2018-06-27 NOTE — H&P (Signed)
Jonathon Bellows, MD [address] [place], [place], [place], [address] [address], [place], [place], [place], [address] Phone: [phone]  Fax: [phone]  Primary Care Physician:  Lovenia Kim, MD   Pre-Procedure History & Physical: HPI:  [name] is a 67 y.o. female is here for an endoscopy    Past Medical History:  Diagnosis Date  . Asthma   . Chronic low back pain   . Collagen vascular disease ([place])   . 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  . COPD (chronic obstructive pulmonary disease) ([place])   . Depression   . Fibromyalgia    08/18/15  [facility]  . GERD (gastroesophageal reflux disease)   . History of CVA (cerebrovascular accident)    "44 minni srokes"  . Hyperlipidemia   . Hypertension   . Migraine   . Personal history of colonic polyp - adenoma 03/01/2014  . Personality disorder ([place])   . Rheumatoid arthritis ([place])    08/18/15  [facility]  . Seizures ([place])    last seizure 4 years ago  . Sleep apnea    Mild. No need for CPAP.  Marland Kitchen Stroke [facility])     Past Surgical History:  Procedure Laterality Date  . ANKLE SURGERY Left   . APPENDECTOMY    . BACK SURGERY    . BUNIONECTOMY     2 toes  . CARDIAC CATHETERIZATION  2008   Normal. Dr Acie Fredrickson.  . CHOLECYSTECTOMY    . CYST EXCISION     leg  . FLANK MASS EXCISION    . FOOT TENDON SURGERY  june 2014   both feet  . KNEE SURGERY     Bilateral.  . NOSE SURGERY    . SHOULDER SURGERY     bil.  Marland Kitchen TOTAL ABDOMINAL HYSTERECTOMY      Prior to Admission medications   Medication Sig Start Date End Date Taking? Authorizing Provider  Adalimumab 40 MG/0.4ML PNKT INJECT THE CONTENTS OF 1 PEN INTO THE SKIN EVERY 7 DAYS 06/12/18  Yes [provider]  albuterol (PROVENTIL HFA;VENTOLIN HFA) 108 (90 Base) MCG/ACT inhaler Inhale 2 puffs into the lungs every 6 (six) hours as needed for wheezing or shortness of breath. 08/13/17  Yes Rogue Bussing, MD  carvedilol (COREG)  3.125 MG tablet TAKE 1 TABLET BY MOUTH TWICE A DAY WITH A MEAL 05/28/18  Yes Lovenia Kim, MD  citalopram (CELEXA) 20 MG tablet TAKE 1 TABLET BY MOUTH ONCE DAILY 06/10/18  Yes Lovenia Kim, MD  clotrimazole (LOTRIMIN) 1 % cream Apply 1 application topically 2 (two) times daily. Under breasts 04/07/18  Yes Rogue Bussing, MD  folic acid (FOLVITE) 1 MG tablet Take 1 mg by mouth. 05/12/15  Yes [provider]  gabapentin (NEURONTIN) 300 MG capsule TAKE 3 CAPSULES BY MOUTH TWICE A DAY 12/16/17  Yes Rogue Bussing, MD  HYDROcodone-acetaminophen (NORCO/VICODIN) 5-325 MG tablet Take 1 tablet by mouth 3 (three) times daily as needed for moderate pain. 09/05/15  Yes Frazier Richards, MD  PHENObarbital (LUMINAL) 100 MG tablet TAKE 1 TABLET BY MOUTH ONCE DAILY 02/03/18  Yes Kathrynn Ducking, MD  tiotropium (SPIRIVA HANDIHALER) 18 MCG inhalation capsule INHALE THE CONTENTS OF 1 CAPSULE VIA HANDIHALER BY MOUTH ONCE DAILY 08/13/17  Yes Rogue Bussing, MD  topiramate (TOPAMAX) 50 MG tablet TAKE 1 TABLET BY MOUTH IN THE MORNING AND 2 TABLETS BY MOUTH AT BEDTIME 05/14/18  Yes Kathrynn Ducking, MD  triamcinolone ointment (KENALOG) 0.5 % APPLY TO AFFECTED AREAS TWICE DAILY AS DIRECTED 04/11/18  Yes Rogue Bussing, MD  ferrous sulfate 325 (65 FE) MG tablet Take 1 tablet (325 mg total) by mouth daily with breakfast. Patient not taking: Reported on 06/27/2018 08/16/17   Rogue Bussing, MD  methotrexate 50 MG/2ML injection  08/04/16   [provider]  minocycline (MINOCIN,DYNACIN) 100 MG capsule Take 1 capsule (100 mg total) by mouth 2 (two) times daily. Patient not taking: Reported on 06/26/2018 08/13/17   Rogue Bussing, MD  omeprazole (PRILOSEC) 20 MG capsule TAKE 1 CAPSULE BY MOUTH ONCE DAILY 06/08/18   Lovenia Kim, MD  ondansetron (ZOFRAN ODT) 4 MG disintegrating tablet Take 1 tablet (4 mg total) by mouth every 8 (eight) hours as needed for nausea or  vomiting. Patient not taking: Reported on 06/26/2018 06/10/18   Harvest Dark, MD  oxyCODONE-acetaminophen (PERCOCET/ROXICET) 5-325 MG tablet Take 1-2 tablets by mouth every 6 (six) hours as needed for moderate pain or severe pain. Patient not taking: Reported on 06/26/2018 12/11/17   Orbie Pyo, MD  sucralfate (CARAFATE) 1 g tablet Take 1 tablet (1 g total) by mouth 4 (four) times daily. Patient not taking: Reported on 06/27/2018 06/26/18 08/25/18  Jonathon Bellows, MD    Allergies as of 06/26/2018 - Review Complete 06/26/2018  Allergen Reaction Noted  . Amoxicillin  11/06/2005  . Aspirin  06/14/2006  . Codeine phosphate  11/06/2005  . Flexeril [cyclobenzaprine] Nausea And Vomiting 07/31/2013  . Ibuprofen Hives 08/11/2014  . Penicillins  11/06/2005  . Tetanus toxoids Swelling 01/15/2012    Family History  Problem Relation Age of Onset  . Cirrhosis Mother   . Diabetes Mother   . Cirrhosis Father   . Breast cancer Sister   . Breast cancer Sister   . Colon cancer Neg Hx     Social History   Socioeconomic History  . Marital status: Married    Spouse name: Clearence   . Number of children: 1  . Years of education: 39  . Highest education level: Not on file  Occupational History  . Occupation: Unemployed   Social Needs  . Financial resource strain: Not on file  . Food insecurity:    Worry: Not on file    Inability: Not on file  . Transportation needs:    Medical: Not on file    Non-medical: Not on file  Tobacco Use  . Smoking status: Former Research scientist (life sciences)  . Smokeless tobacco: Never Used  Substance and Sexual Activity  . Alcohol use: No  . Drug use: No  . Sexual activity: Not on file  Lifestyle  . Physical activity:    Days per week: Not on file    Minutes per session: Not on file  . Stress: Not on file  Relationships  . Social connections:    Talks on phone: Not on file    Gets together: Not on file    Attends religious service: Not on file    Active member of  club or organization: Not on file    Attends meetings of clubs or organizations: Not on file    Relationship status: Not on file  . Intimate partner violence:    Fear of current or ex partner: Not on file    Emotionally abused: Not on file    Physically abused: Not on file    Forced sexual activity: Not on file  Other Topics Concern  . Not on file  Social  History Narrative   Patient lives at home with her husband [name].    Patient is currently not working.    Patient has 1 child.    Patient has a 11th grade education.     Review of Systems: See HPI, otherwise negative ROS  Physical Exam: BP (!) 150/92   Pulse 63   Temp (!) 95.8 F (35.4 C) (Tympanic)   Resp 16   Ht 5\' 6"  (1.676 m)   Wt 103.4 kg   SpO2 100%   BMI 36.80 kg/m  General:   Alert,  pleasant and cooperative in NAD Head:  Normocephalic and atraumatic. Neck:  Supple; no masses or thyromegaly. Lungs:  Clear throughout to auscultation, normal respiratory effort.    Heart:  +S1, +S2, Regular rate and rhythm, No edema. Abdomen:  Soft, nontender and nondistended. Normal bowel sounds, without guarding, and without rebound.   Neurologic:  Alert and  oriented x4;  grossly normal neurologically.  Impression/Plan: [name] is here for an endoscopy  to be performed for  evaluation of abdominal pain     Risks, benefits, limitations, and alternatives regarding endoscopy have been reviewed with the patient.  Questions have been answered.  All parties agreeable.   Jonathon Bellows, MD  06/27/2018, 8:43 AM

## 2018-06-27 NOTE — Transfer of Care (Signed)
Immediate Anesthesia Transfer of Care Note  Patient: [name]  Procedure(s) Performed: ESOPHAGOGASTRODUODENOSCOPY (EGD) WITH PROPOFOL (N/A )  Patient Location: PACU  Anesthesia Type:General  Level of Consciousness: sedated  Airway & Oxygen Therapy: Patient Spontanous Breathing and Patient connected to nasal cannula oxygen  Post-op Assessment: Report given to RN and Post -op Vital signs reviewed and stable  Post vital signs: Reviewed and stable  Last Vitals:  Vitals Value Taken Time  BP 117/79 06/27/2018  9:06 AM  Temp 36 C 06/27/2018  9:06 AM  Pulse 58 06/27/2018  9:06 AM  Resp 15 06/27/2018  9:06 AM  SpO2 96 % 06/27/2018  9:06 AM    Last Pain:  Vitals:   06/27/18 0906  TempSrc: Tympanic  PainSc:       Patients Stated Pain Goal: 0 (45/62/56 3893)  Complications: No apparent anesthesia complications

## 2018-06-27 NOTE — Anesthesia Post-op Follow-up Note (Signed)
Anesthesia QCDR form completed.        

## 2018-06-27 NOTE — Op Note (Signed)
[facility] Gastroenterology Patient Name: [name] Procedure Date: 06/27/2018 8:39 AM MRN: [id number] Account #: [id number] Date of Birth: 02/06/1951 Admit Type: Outpatient Age: 67 Room: [facility] ENDO ROOM 4 Gender: Female Note Status: Finalized Procedure:            Upper GI endoscopy Indications:          Epigastric abdominal pain Providers:            Jonathon Bellows MD, MD Referring MD:         Lovenia Kim (Referring MD) Medicines:            Monitored Anesthesia Care Complications:        No immediate complications. Procedure:            Pre-Anesthesia Assessment:                       - Prior to the procedure, a History and Physical was                        performed, and patient medications, allergies and                        sensitivities were reviewed. The patient's tolerance of                        previous anesthesia was reviewed.                       - The risks and benefits of the procedure and the                        sedation options and risks were discussed with the                        patient. All questions were answered and informed                        consent was obtained.                       - ASA Grade Assessment: II - A patient with mild                        systemic disease.                       After obtaining informed consent, the endoscope was                        passed under direct vision. Throughout the procedure,                        the patient's blood pressure, pulse, and oxygen                        saturations were monitored continuously. The Endoscope                        was introduced through the mouth, and advanced to the  third part of duodenum. The upper GI endoscopy was                        accomplished with ease. The patient tolerated the                        procedure well. Findings:      The esophagus was normal.      The examined duodenum was normal.      Patchy  moderate inflammation characterized by congestion (edema),       erosions and erythema was found in the gastric antrum. Biopsies were       taken with a cold forceps for histology.      The cardia and gastric fundus were normal on retroflexion. Impression:           - Normal esophagus.                       - Normal examined duodenum.                       - Gastritis. Biopsied. Recommendation:       - Discharge patient to home (with escort).                       - Resume previous diet.                       - Continue present medications.                       - Await pathology results.                       - Return to my office as previously scheduled. Procedure Code(s):    --- Professional ---                       [phone], Esophagogastroduodenoscopy, flexible, transoral;                        with biopsy, single or multiple Diagnosis Code(s):    --- Professional ---                       K29.70, Gastritis, unspecified, without bleeding                       R10.13, Epigastric pain CPT copyright 2017 American Medical Association. All rights reserved. The codes documented in this report are preliminary and upon coder review may  be revised to meet current compliance requirements. Jonathon Bellows, MD Jonathon Bellows MD, MD 06/27/2018 9:02:41 AM This report has been signed electronically. Number of Addenda: 0 Note Initiated On: 06/27/2018 8:39 AM      [facility]

## 2018-06-27 NOTE — Anesthesia Procedure Notes (Signed)
Date/Time: 06/27/2018 8:52 AM Performed by: Johnna Acosta, CRNA Pre-anesthesia Checklist: Patient identified, Emergency Drugs available, Suction available, Patient being monitored and Timeout performed Patient Re-evaluated:Patient Re-evaluated prior to induction Oxygen Delivery Method: Nasal cannula Preoxygenation: Pre-oxygenation with 100% oxygen Induction Type: IV induction

## 2018-06-28 LAB — H. PYLORI BREATH TEST: H pylori Breath Test: NEGATIVE

## 2018-06-29 ENCOUNTER — Encounter: Payer: Self-pay | Admitting: Gastroenterology

## 2018-06-30 ENCOUNTER — Encounter: Payer: Self-pay | Admitting: Gastroenterology

## 2018-06-30 LAB — SURGICAL PATHOLOGY

## 2018-06-30 NOTE — Anesthesia Postprocedure Evaluation (Signed)
Anesthesia Post Note  Patient: [name]  Procedure(s) Performed: ESOPHAGOGASTRODUODENOSCOPY (EGD) WITH PROPOFOL (N/A )  Patient location during evaluation: PACU Anesthesia Type: General Level of consciousness: awake and alert Pain management: pain level controlled Vital Signs Assessment: post-procedure vital signs reviewed and stable Respiratory status: spontaneous breathing, nonlabored ventilation, respiratory function stable and patient connected to nasal cannula oxygen Cardiovascular status: blood pressure returned to baseline and stable Postop Assessment: no apparent nausea or vomiting Anesthetic complications: no     Last Vitals:  Vitals:   06/27/18 0916 06/27/18 0926  BP: 134/78 (!) 152/88  Pulse: 61 (!) 58  Resp: 15 15  Temp:    SpO2: 98% 100%    Last Pain:  Vitals:   06/27/18 0926  TempSrc:   PainSc: [place]

## 2018-06-30 NOTE — Anesthesia Preprocedure Evaluation (Signed)
Anesthesia Evaluation  Patient identified by MRN, date of birth, ID band Patient awake    Reviewed: Allergy & Precautions, H&P , NPO status , Patient's Chart, lab work & pertinent test results, reviewed documented beta blocker date and time   Airway Mallampati: II   Neck ROM: full    Dental  (+) Poor Dentition   Pulmonary neg pulmonary ROS, asthma , sleep apnea , COPD, former smoker,    Pulmonary exam normal        Cardiovascular Exercise Tolerance: Poor hypertension, On Medications negative cardio ROS Normal cardiovascular exam Rhythm:regular Rate:Normal     Neuro/Psych  Headaches, Seizures -,  PSYCHIATRIC DISORDERS Anxiety Depression  Neuromuscular disease CVA negative neurological ROS  negative psych ROS   GI/Hepatic negative GI ROS, Neg liver ROS, GERD  ,  Endo/Other  negative endocrine ROS  Renal/GU negative Renal ROS  negative genitourinary   Musculoskeletal   Abdominal   Peds  Hematology negative hematology ROS (+)   Anesthesia Other Findings Past Medical History: No date: Asthma No date: Chronic low back pain No date: Collagen vascular disease ([facility]) No date: Conversion disorder with seizures or convulsions     Comment:  On Phenobarbital. No date: COPD (chronic obstructive pulmonary disease) ([facility]) No date: Depression No date: Fibromyalgia     Comment:  08/18/15  [facility] No date: GERD (gastroesophageal reflux disease) No date: History of CVA (cerebrovascular accident)     Comment:  "57 minni srokes" No date: Hyperlipidemia No date: Hypertension No date: Migraine 03/01/2014: Personal history of colonic polyp - adenoma No date: Personality disorder ([place]) No date: Rheumatoid arthritis ([place])     Comment:  08/18/15  [facility] No date: Seizures ([place])     Comment:  last seizure 4 years ago No date: Sleep apnea     Comment:  Mild. No need for CPAP. No date: Stroke [facility]) Past Surgical History: No date: ANKLE  SURGERY; Left No date: APPENDECTOMY No date: BACK SURGERY No date: BUNIONECTOMY     Comment:  2 toes 2008: CARDIAC CATHETERIZATION     Comment:  Normal. Dr Acie Fredrickson. No date: CHOLECYSTECTOMY No date: CYST EXCISION     Comment:  leg 06/27/2018: ESOPHAGOGASTRODUODENOSCOPY (EGD) WITH PROPOFOL; N/A     Comment:  Procedure: ESOPHAGOGASTRODUODENOSCOPY (EGD) WITH               PROPOFOL;  Surgeon: Jonathon Bellows, MD;  Location: [facility]               ENDOSCOPY;  Service: Gastroenterology;  Laterality: N/A; No date: FLANK MASS EXCISION june 2014: FOOT TENDON SURGERY     Comment:  both feet No date: KNEE SURGERY     Comment:  Bilateral. No date: NOSE SURGERY No date: SHOULDER SURGERY     Comment:  bil. No date: TOTAL ABDOMINAL HYSTERECTOMY BMI    Body Mass Index:  36.80 kg/m     Reproductive/Obstetrics negative OB ROS                             Anesthesia Physical Anesthesia Plan  ASA: III  Anesthesia Plan: General   Post-op Pain Management:    Induction:   PONV Risk Score and Plan:   Airway Management Planned:   Additional Equipment:   Intra-op Plan:   Post-operative Plan:   Informed Consent: I have reviewed the patients History and Physical, chart, labs and discussed the procedure including the risks, benefits and alternatives for the proposed  anesthesia with the patient or authorized representative who has indicated his/her understanding and acceptance.   Dental Advisory Given  Plan Discussed with: CRNA  Anesthesia Plan Comments:         Anesthesia Quick Evaluation

## 2018-07-06 ENCOUNTER — Encounter: Payer: Self-pay | Admitting: Gastroenterology

## 2018-07-14 ENCOUNTER — Other Ambulatory Visit: Payer: Self-pay

## 2018-07-14 DIAGNOSIS — G8929 Other chronic pain: Secondary | ICD-10-CM

## 2018-07-14 MED ORDER — GABAPENTIN 300 MG PO CAPS: ORAL_CAPSULE | ORAL | 5 refills | Status: DC

## 2018-07-23 ENCOUNTER — Emergency Department: Payer: Medicare Other

## 2018-07-23 ENCOUNTER — Emergency Department
Admission: EM | Admit: 2018-07-23 | Discharge: 2018-07-23 | Disposition: A | Discharge status: Home / Self Care | Payer: Medicare Other | Attending: Emergency Medicine | Admitting: Emergency Medicine

## 2018-07-23 ENCOUNTER — Encounter: Payer: Self-pay | Admitting: Emergency Medicine

## 2018-07-23 DIAGNOSIS — J449 Chronic obstructive pulmonary disease, unspecified: Secondary | ICD-10-CM | POA: Insufficient documentation

## 2018-07-23 DIAGNOSIS — M069 Rheumatoid arthritis, unspecified: Secondary | ICD-10-CM | POA: Insufficient documentation

## 2018-07-23 DIAGNOSIS — I10 Essential (primary) hypertension: Secondary | ICD-10-CM | POA: Insufficient documentation

## 2018-07-23 DIAGNOSIS — K529 Noninfective gastroenteritis and colitis, unspecified: Secondary | ICD-10-CM | POA: Diagnosis not present

## 2018-07-23 DIAGNOSIS — R109 Unspecified abdominal pain: Secondary | ICD-10-CM | POA: Diagnosis present

## 2018-07-23 LAB — URINALYSIS, COMPLETE (UACMP) WITH MICROSCOPIC
Bacteria, UA: NONE SEEN
Bilirubin Urine: NEGATIVE
Glucose, UA: NEGATIVE mg/dL
Hgb urine dipstick: NEGATIVE
Ketones, ur: 20 mg/dL — AB
Leukocytes, UA: NEGATIVE
Nitrite: NEGATIVE
Protein, ur: 30 mg/dL — AB
SPECIFIC GRAVITY, URINE: 1.026 (ref 1.005–1.030)
pH: 5 (ref 5.0–8.0)

## 2018-07-23 LAB — COMPREHENSIVE METABOLIC PANEL
ALK PHOS: 100 U/L (ref 38–126)
ALT: 12 U/L (ref 0–44)
AST: 19 U/L (ref 15–41)
Albumin: 3.7 g/dL (ref 3.5–5.0)
Anion gap: 12 (ref 5–15)
BUN: 17 mg/dL (ref 8–23)
CALCIUM: 8.7 mg/dL — AB (ref 8.9–10.3)
CHLORIDE: 106 mmol/L (ref 98–111)
CO2: 22 mmol/L (ref 22–32)
CREATININE: 0.86 mg/dL (ref 0.44–1.00)
GFR calc Af Amer: >60 mL/min (ref >60)
Glucose, Bld: 103 mg/dL — ABNORMAL HIGH (ref 70–99)
Potassium: 3.6 mmol/L (ref 3.5–5.1)
Sodium: 140 mmol/L (ref 135–145)
Total Bilirubin: 0.7 mg/dL (ref 0.3–1.2)
Total Protein: 8.3 g/dL — ABNORMAL HIGH (ref 6.5–8.1)

## 2018-07-23 LAB — CBC
HCT: 35.8 % (ref 35.0–47.0)
Hemoglobin: 11.9 g/dL — ABNORMAL LOW (ref 12.0–16.0)
MCH: 27.1 pg (ref 26.0–34.0)
MCHC: 33.3 g/dL (ref 32.0–36.0)
MCV: 81.5 fL (ref 80.0–100.0)
Platelets: 187 10*3/uL (ref 150–440)
RBC: 4.39 MIL/uL (ref 3.80–5.20)
RDW: 15.9 % — AB (ref 11.5–14.5)
WBC: 8.3 10*3/uL (ref 3.6–11.0)

## 2018-07-23 LAB — LIPASE, BLOOD: LIPASE: 33 U/L (ref 11–51)

## 2018-07-23 MED ORDER — ONDANSETRON HCL 4 MG/2ML IJ SOLN: 4.0000 mg | Freq: Once | INTRAMUSCULAR | Status: DC | PRN

## 2018-07-23 MED ORDER — ONDANSETRON 4 MG PO TBDP: 4.0000 mg | ORAL_TABLET | Freq: Three times a day (TID) | ORAL | 0 refills | Status: DC | PRN

## 2018-07-23 MED ORDER — SODIUM CHLORIDE 0.9 % IV SOLN
Freq: Once | INTRAVENOUS | Status: AC
  Administered 2018-07-23: 15:00:00 via INTRAVENOUS

## 2018-07-23 MED ORDER — DICYCLOMINE HCL 20 MG PO TABS: 20.0000 mg | ORAL_TABLET | Freq: Three times a day (TID) | ORAL | 0 refills | Status: DC | PRN

## 2018-07-23 MED ORDER — METOCLOPRAMIDE HCL 5 MG/ML IJ SOLN
10.0000 mg | Freq: Once | INTRAMUSCULAR | Status: AC
  Administered 2018-07-23: 10 mg via INTRAVENOUS
  Filled 2018-07-23: qty 2

## 2018-07-23 MED ORDER — DIPHENHYDRAMINE HCL 25 MG PO CAPS
25.0000 mg | ORAL_CAPSULE | Freq: Once | ORAL | Status: AC
  Administered 2018-07-23: 25 mg via ORAL

## 2018-07-23 MED ORDER — MORPHINE SULFATE (PF) 2 MG/ML IV SOLN
2.0000 mg | Freq: Once | INTRAVENOUS | Status: AC
  Administered 2018-07-23: 2 mg via INTRAVENOUS
  Filled 2018-07-23: qty 1

## 2018-07-23 MED ORDER — ONDANSETRON HCL 4 MG/2ML IJ SOLN
4.0000 mg | Freq: Once | INTRAMUSCULAR | Status: AC
  Administered 2018-07-23: 4 mg via INTRAVENOUS
  Filled 2018-07-23: qty 2

## 2018-07-23 MED ORDER — DIPHENHYDRAMINE HCL 25 MG PO CAPS
ORAL_CAPSULE | ORAL | Status: AC
  Filled 2018-07-23: qty 1

## 2018-07-23 MED ORDER — LOPERAMIDE HCL 2 MG PO CAPS
4.0000 mg | ORAL_CAPSULE | Freq: Once | ORAL | Status: AC
  Administered 2018-07-23: 4 mg via ORAL
  Filled 2018-07-23: qty 2

## 2018-07-23 NOTE — ED Triage Notes (Signed)
Patient presents to the ED via EMS from home.  Patient reports feeling badly x 3 days.  Patient reports abdominal pain, nausea, vomiting and diarrhea.  Patient states she does not have an appetite.  Patient reports headache and hot and cold chills.  Denies fever.  Denies cough and congestion.

## 2018-07-23 NOTE — ED Notes (Signed)
Per patient, EMS gave 4mg  zofran enroute.

## 2018-07-23 NOTE — ED Provider Notes (Signed)
[facility] Emergency Department Provider Note       Time seen: ----------------------------------------- 2:15 PM on 07/23/2018 -----------------------------------------   I have reviewed the triage vital signs and the nursing notes.  HISTORY   Chief Complaint Abdominal Pain; Headache; and Chills    HPI [name] is a 67 y.o. female with a history of asthma, chronic back pain, collagen vascular disease, COPD, fibromyalgia who presents to the ED for nausea, abdominal pain, diarrhea.  Patient reports feeling bad for the past 3 days with poor appetite.  She also reports headache as well as feeling hot and cold chills but denies any fevers.  She denies any other complaints at this time.  Past Medical History:  Diagnosis Date  . Asthma   . Chronic low back pain   . Collagen vascular disease ([place])   . 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  . COPD (chronic obstructive pulmonary disease) ([place])   . Depression   . Fibromyalgia    08/18/15  [facility]  . GERD (gastroesophageal reflux disease)   . History of CVA (cerebrovascular accident)    "31 minni srokes"  . Hyperlipidemia   . Hypertension   . Migraine   . Personal history of colonic polyp - adenoma 03/01/2014  . Personality disorder ([place])   . Rheumatoid arthritis ([place])    08/18/15  [facility]  . Seizures ([place])    last seizure 4 years ago  . Sleep apnea    Mild. No need for CPAP.  Marland Kitchen Stroke [facility])     Patient Active Problem List   Diagnosis Date Noted  . Nasal congestion 08/15/2017  . Hypersomnia with sleep apnea 04/18/2017  . Snoring 04/18/2017  . Post traumatic stress disorder (PTSD) 04/18/2017  . COPD mixed type ([place]) 04/18/2017  . Somnolence, daytime 02/26/2017  . Cervical paraspinal muscle spasm 10/26/2016  . Lower back pain 05/27/2016  . Left hip pain 05/27/2016  . Fibromyalgia 08/18/2015  . Rheumatoid arthritis ([place]) 03/09/2015  . Essential hypertension, benign  02/18/2015  . Right knee pain 02/18/2015  . Cervical disc disorder with radiculopathy of cervical region 01/21/2015  . Bilateral arm pain 01/13/2015  . After cataract 07/22/2014  . Personal history of colonic polyp - adenoma 03/01/2014  . Candidal intertrigo 12/29/2013  . Dyshidrotic eczema 12/29/2013  . Routine adult health maintenance 12/29/2013  . 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 Frequent falls 02/10/2013  . Generalized pain 08/30/2012  . STROKE 07/29/2008  . SEIZURE DISORDER 07/29/2008  . Migraine without aura 07/29/2007  . DEPENDENCE, BARB/SED, CONTINUOUS 06/24/2007  . HYPERLIPIDEMIA 12/19/2006  . OBESITY, NOS 12/19/2006  . DEPRESSION, MAJOR, RECURRENT 12/19/2006  . PANIC ATTACKS 12/19/2006  . 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 BORDERLINE PERSONALITY 12/19/2006  . COPD 12/19/2006  . REFLUX ESOPHAGITIS 12/19/2006    Past Surgical History:  Procedure Laterality Date  . ANKLE SURGERY Left   . APPENDECTOMY    . BACK SURGERY    . BUNIONECTOMY     2 toes  . CARDIAC CATHETERIZATION  2008   Normal. Dr Acie Fredrickson.  . CHOLECYSTECTOMY    . CYST EXCISION     leg  . ESOPHAGOGASTRODUODENOSCOPY (EGD) WITH PROPOFOL N/A 06/27/2018   Procedure: ESOPHAGOGASTRODUODENOSCOPY (EGD) WITH PROPOFOL;  Surgeon: Jonathon Bellows, MD;  Location: [facility] ENDOSCOPY;  Service: Gastroenterology;  Laterality: N/A;  . FLANK MASS EXCISION    . FOOT TENDON SURGERY  june 2014   both feet  . KNEE SURGERY  Bilateral.  . NOSE SURGERY    . SHOULDER SURGERY     bil.  Marland Kitchen TOTAL ABDOMINAL HYSTERECTOMY      Allergies Amoxicillin; Aspirin; Codeine phosphate; Flexeril [cyclobenzaprine]; Ibuprofen; Penicillins; and Tetanus toxoids  Social History Social History   Tobacco Use  . Smoking status: Former Research scientist (life sciences)  . Smokeless tobacco: Never Used  Substance Use Topics  . Alcohol use: No  . Drug use: No   Review of Systems Constitutional: Negative for fever.  Positive for chills Cardiovascular:  Negative for chest pain. Respiratory: Negative for shortness of breath. Gastrointestinal: Positive for abdominal pain, vomiting and diarrhea Musculoskeletal: Negative for back pain. Skin: Negative for rash. Neurological: Positive for headache  All systems negative/normal/unremarkable except as stated in the HPI  ____________________________________________   PHYSICAL EXAM:  VITAL SIGNS: ED Triage Vitals  Enc Vitals Group     BP 07/23/18 1137 130/83     Pulse Rate 07/23/18 1137 83     Resp 07/23/18 1137 20     Temp 07/23/18 1137 98.6 F (37 C)     Temp Source 07/23/18 1137 Oral     SpO2 07/23/18 1400 95 %     Weight 07/23/18 1138 212 lb (96.2 kg)     Height 07/23/18 1138 5\' 6"  (1.676 m)     Head Circumference --      Peak Flow --      Pain Score 07/23/18 1145 9     Pain Loc --      Pain Edu? --      Excl. in [place]? --    Constitutional: Alert and oriented. Well appearing and in no distress. Eyes: Conjunctivae are normal. Normal extraocular movements. ENT   Head: Normocephalic and atraumatic.   Nose: No congestion/rhinnorhea.   Mouth/Throat: Mucous membranes are moist.   Neck: No stridor. Cardiovascular: Normal rate, regular rhythm. No murmurs, rubs, or gallops. Respiratory: Normal respiratory effort without tachypnea nor retractions. Breath sounds are clear and equal bilaterally. No wheezes/rales/rhonchi. Gastrointestinal: Soft and nontender. Normal bowel sounds Musculoskeletal: Nontender with normal range of motion in extremities. No lower extremity tenderness nor edema. Neurologic:  Normal speech and language. No gross focal neurologic deficits are appreciated.  Skin:  Skin is warm, dry and intact. No rash noted. Psychiatric: Mood and affect are normal. Speech and behavior are normal.  ____________________________________________  ED COURSE:  As part of my medical decision making, I reviewed the following data within the [place] History  obtained from family if available, nursing notes, old chart and ekg, as well as notes from prior ED visits. Patient presented for vomiting and diarrhea as well as general ill feeling, we will assess with labs and imaging as indicated at this time.   Procedures ____________________________________________   LABS (pertinent positives/negatives)  Labs Reviewed  COMPREHENSIVE METABOLIC PANEL - Abnormal; Notable for the following components:      Result Value   Glucose, Bld 103 (*)    Calcium 8.7 (*)    Total Protein 8.3 (*)    All other components within normal limits  CBC - Abnormal; Notable for the following components:   Hemoglobin 11.9 (*)    RDW 15.9 (*)    All other components within normal limits  URINALYSIS, COMPLETE (UACMP) WITH MICROSCOPIC - Abnormal; Notable for the following components:   Color, Urine AMBER (*)    APPearance HAZY (*)    Ketones, ur 20 (*)    Protein, ur 30 (*)    All other components  within normal limits  LIPASE, BLOOD   ____________________________________________  DIFFERENTIAL DIAGNOSIS   Gastroenteritis, dehydration, electrolyte abnormality  FINAL ASSESSMENT AND PLAN  Gastroenteritis   Plan: The patient had presented for vomiting and diarrhea. Patient's labs are reassuring. Patient's imaging does not reveal any acute process.  Currently she is improved after fluids and medication.  She will be discharged home with similar with close outpatient follow-up.   Laurence Aly, MD   Note: This note was generated in part or whole with voice recognition software. Voice recognition is usually quite accurate but there are transcription errors that can and very often do occur. I apologize for any typographical errors that were not detected and corrected.     Earleen Newport, MD 07/23/18 [phone]

## 2018-07-23 NOTE — ED Notes (Signed)
Pt taken X-ray at this time.

## 2018-07-23 NOTE — ED Notes (Signed)
Pt c/o itching below and above IV site after morphine admin. Pt states she has taken morphine and zofran previously without reaction. EDP Williams notified. 25mg  Benadryl PO ordered. No rash or shortness of breath/wheezing noted at this time.

## 2018-07-23 NOTE — ED Notes (Signed)
First nurse note: Pt here via EMS with c/o nausea, abd pain, diarrhea, feels warm to the touch per EMS, mask applied to pt, BP 162/100, glucose 87, pt appears in NAD.

## 2018-08-04 ENCOUNTER — Ambulatory Visit (INDEPENDENT_AMBULATORY_CARE_PROVIDER_SITE_OTHER): Payer: Medicare Other | Admitting: Gastroenterology

## 2018-08-04 ENCOUNTER — Encounter: Payer: Self-pay | Admitting: Gastroenterology

## 2018-08-04 VITALS — BP 125/76 | HR 71 | Ht 66.0 in | Wt 224.8 lb

## 2018-08-04 DIAGNOSIS — T39395A Adverse effect of other nonsteroidal anti-inflammatory drugs [NSAID], initial encounter: Secondary | ICD-10-CM | POA: Diagnosis not present

## 2018-08-04 DIAGNOSIS — K2960 Other gastritis without bleeding: Secondary | ICD-10-CM | POA: Diagnosis not present

## 2018-08-04 NOTE — Progress Notes (Signed)
Jonathon Bellows MD, MRCP(U.K) [address]  [place]  [place], [place] [address]  Main: [phone]  Fax: [phone]   Primary Care Physician: Lovenia Kim, MD  Primary Gastroenterologist:  Dr. Jonathon Bellows   No chief complaint on file.   HPI: [name] is a 67 y.o. female  Summary of history :  She was initially referred and seen on 06/26/18 . She visited the emergency room on 06/10/2018 with a complaint of abdominal pain.  It had began 2 days prior to her presentation.  He has had a cholecystectomy in the past.  Her medical records suggest she is on oxycodone.  And a CT scan of the abdomen which showed no acute abnormality.  normal urinalysis, hemoglobin of 11 g and MCV of 81.  Hemoglobin is 10.5 g a year prior.  Creatinine of 0.75.  Lipase of 24. Colonic diverticulosis was noted.  Discharged.  Do note a similar CT scan performed in February 2019 again showing cardiomegaly and acute uncomplicated descending colon diverticulitis.  Last colonoscopy was in May 2015.  Performed by Dr. Arelia Longest.  At that point of time she had severe diverticulosis of the sigmoid colon.  Diminutive sessile was found in the ascending colon and was taken out.Pathology report shows that it was an adenoma.  She stated  that the abdominal pain began a few weeks prior to her initial visit  , got worse when she went to the ER, eating or drinking made it worse, pointed to the center and upper part of the abdomen. Associated with nausea and vomiting , no blood. ?fever. Occasional diarrhea. She had a bowel movement daily- soft  . 3 x a day , hurts when she goes, pain no better after a bowel movement. No NSAID's.She took hydrocodone for fibromyalgia.    Interval history   06/26/2018-  08/04/2018  06/26/18 : H pylori breath test -negative 06/27/18 : EGD: antral gastritis seen and chronic gastritis seen on Bx  Seen for gastroenteritis on 07/23/18 at the ER.  No more diarrhea. Not much abdominal pain. She states she used  to take BC's . She stopped about a week back . Pain has improved after stopping Bc's .     Current Outpatient Medications  Medication Sig Dispense Refill  . Adalimumab 40 MG/0.4ML PNKT INJECT THE CONTENTS OF 1 PEN INTO THE SKIN EVERY 7 DAYS    . albuterol (PROVENTIL HFA;VENTOLIN HFA) 108 (90 Base) MCG/ACT inhaler Inhale 2 puffs into the lungs every 6 (six) hours as needed for wheezing or shortness of breath. 1 Inhaler 2  . carvedilol (COREG) 3.125 MG tablet TAKE 1 TABLET BY MOUTH TWICE A DAY WITH A MEAL 180 tablet 1  . citalopram (CELEXA) 20 MG tablet TAKE 1 TABLET BY MOUTH ONCE DAILY 30 tablet 2  . clotrimazole (LOTRIMIN) 1 % cream Apply 1 application topically 2 (two) times daily. Under breasts 30 g 1  . dicyclomine (BENTYL) 20 MG tablet Take 1 tablet (20 mg total) by mouth 3 (three) times daily as needed for spasms. 20 tablet 0  . ferrous sulfate 325 (65 FE) MG tablet Take 1 tablet (325 mg total) by mouth daily with breakfast. (Patient not taking: Reported on 06/27/2018) 30 tablet 2  . folic acid (FOLVITE) 1 MG tablet Take 1 mg by mouth.    . gabapentin (NEURONTIN) 300 MG capsule TAKE 3 CAPSULES BY MOUTH TWICE A DAY 180 capsule 5  . HYDROcodone-acetaminophen (NORCO/VICODIN) 5-325 MG tablet Take 1 tablet by mouth 3 (  three) times daily as needed for moderate pain. 90 tablet 0  . methotrexate 50 MG/2ML injection     . minocycline (MINOCIN,DYNACIN) 100 MG capsule Take 1 capsule (100 mg total) by mouth 2 (two) times daily. (Patient not taking: Reported on 06/26/2018) 14 capsule 0  . omeprazole (PRILOSEC) 20 MG capsule TAKE 1 CAPSULE BY MOUTH ONCE DAILY 30 capsule 11  . ondansetron (ZOFRAN ODT) 4 MG disintegrating tablet Take 1 tablet (4 mg total) by mouth every 8 (eight) hours as needed for nausea or vomiting. (Patient not taking: Reported on 06/26/2018) 20 tablet 0  . ondansetron (ZOFRAN ODT) 4 MG disintegrating tablet Take 1 tablet (4 mg total) by mouth every 8 (eight) hours as needed for nausea or  vomiting. 20 tablet 0  . oxyCODONE-acetaminophen (PERCOCET/ROXICET) 5-325 MG tablet Take 1-2 tablets by mouth every 6 (six) hours as needed for moderate pain or severe pain. (Patient not taking: Reported on 06/26/2018) 10 tablet 0  . PHENObarbital (LUMINAL) 100 MG tablet TAKE 1 TABLET BY MOUTH ONCE DAILY 30 tablet 5  . sucralfate (CARAFATE) 1 g tablet Take 1 tablet (1 g total) by mouth 4 (four) times daily. (Patient not taking: Reported on 06/27/2018) 120 tablet 1  . tiotropium (SPIRIVA HANDIHALER) 18 MCG inhalation capsule INHALE THE CONTENTS OF 1 CAPSULE VIA HANDIHALER BY MOUTH ONCE DAILY 30 capsule 3  . topiramate (TOPAMAX) 50 MG tablet TAKE 1 TABLET BY MOUTH IN THE MORNING AND 2 TABLETS BY MOUTH AT BEDTIME 90 tablet 3  . triamcinolone ointment (KENALOG) 0.5 % APPLY TO AFFECTED AREAS TWICE DAILY AS DIRECTED 60 g 0   No current facility-administered medications for this visit.     Allergies as of 08/04/2018 - Review Complete 07/23/2018  Allergen Reaction Noted  . Amoxicillin  11/06/2005  . Aspirin  06/14/2006  . Codeine phosphate  11/06/2005  . Flexeril [cyclobenzaprine] Nausea And Vomiting 07/31/2013  . Ibuprofen Hives 08/11/2014  . Penicillins  11/06/2005  . Tetanus toxoids Swelling 01/15/2012    ROS:  General: Negative for anorexia, weight loss, fever, chills, fatigue, weakness. ENT: Negative for hoarseness, difficulty swallowing , nasal congestion. CV: Negative for chest pain, angina, palpitations, dyspnea on exertion, peripheral edema.  Respiratory: Negative for dyspnea at rest, dyspnea on exertion, cough, sputum, wheezing.  GI: See history of present illness. GU:  Negative for dysuria, hematuria, urinary incontinence, urinary frequency, nocturnal urination.  Endo: Negative for unusual weight change.    Physical Examination:   There were no vitals taken for this visit.  General: Well-nourished, well-developed in no acute distress.  Eyes: No icterus. Conjunctivae pink. Mouth:  Oropharyngeal mucosa moist and pink , no lesions erythema or exudate. Lungs: Clear to auscultation bilaterally. Non-labored. Heart: Regular rate and rhythm, no murmurs rubs or gallops.  Abdomen: Bowel sounds are normal, nontender, nondistended, no hepatosplenomegaly or masses, no abdominal bruits or hernia , no rebound or guarding.   Extremities: No lower extremity edema. No clubbing or deformities. Neuro: Alert and oriented x 3.  Grossly intact. Skin: Warm and dry, no jaundice.   Psych: Alert and cooperative, normal mood and affect.   Imaging Studies: Dg Abd 2 Views  Result Date: 07/23/2018 CLINICAL DATA:  Abdominal pain with vomiting EXAM: ABDOMEN - 2 VIEW COMPARISON:  CT 06/10/2018 FINDINGS: Lung bases are clear. No free air beneath the diaphragm. Surgical clips in the right upper quadrant. Nonobstructed bowel-gas pattern with scattered small and large bowel gas. Small phleboliths in the left pelvis. IMPRESSION: Nonobstructed bowel-gas pattern Electronically  Signed   By: Donavan Foil M.D.   On: 07/23/2018 14:55    Assessment and Plan:   [name] is a 67 y.o. y/o female h+ere to follow up for abdominal pain. H pylori negative, CT abdomen shows no acute features. EGD shows chronic gastritis. Today she admits she was taking BC powder till she was advised to stop after her EGD. Since cessation the pain has been improving   Plan 1. Carafate PRN 2. Stop All NSAID's 3. Repeat colonoscopy in 02/2019.     Dr Jonathon Bellows  MD,MRCP [facility]) Follow up PRN

## 2018-08-07 ENCOUNTER — Other Ambulatory Visit: Payer: Self-pay | Admitting: Neurology

## 2018-08-13 ENCOUNTER — Other Ambulatory Visit: Payer: Self-pay | Admitting: Neurology

## 2018-08-13 ENCOUNTER — Other Ambulatory Visit: Payer: Self-pay | Admitting: *Deleted

## 2018-08-13 ENCOUNTER — Other Ambulatory Visit: Payer: Self-pay | Admitting: Family Medicine

## 2018-08-13 DIAGNOSIS — F32A Depression, unspecified: Secondary | ICD-10-CM

## 2018-08-13 DIAGNOSIS — F329 Major depressive disorder, single episode, unspecified: Secondary | ICD-10-CM

## 2018-08-13 DIAGNOSIS — J449 Chronic obstructive pulmonary disease, unspecified: Secondary | ICD-10-CM

## 2018-08-14 MED ORDER — ALBUTEROL SULFATE HFA 108 (90 BASE) MCG/ACT IN AERS: 2.0000 | INHALATION_SPRAY | Freq: Four times a day (QID) | RESPIRATORY_TRACT | 2 refills | Status: DC | PRN

## 2018-09-11 ENCOUNTER — Other Ambulatory Visit: Payer: Self-pay | Admitting: Neurology

## 2018-09-15 ENCOUNTER — Ambulatory Visit: Payer: Medicare Other | Admitting: Adult Health

## 2018-10-16 ENCOUNTER — Ambulatory Visit (INDEPENDENT_AMBULATORY_CARE_PROVIDER_SITE_OTHER): Payer: Medicare Other | Admitting: Family Medicine

## 2018-10-16 ENCOUNTER — Other Ambulatory Visit: Payer: Self-pay

## 2018-10-16 ENCOUNTER — Encounter: Payer: Self-pay | Admitting: Family Medicine

## 2018-10-16 VITALS — BP 122/74 | HR 90 | Temp 97.7°F | Ht 66.0 in | Wt 218.0 lb

## 2018-10-16 DIAGNOSIS — E785 Hyperlipidemia, unspecified: Secondary | ICD-10-CM

## 2018-10-16 DIAGNOSIS — I2510 Atherosclerotic heart disease of native coronary artery without angina pectoris: Secondary | ICD-10-CM

## 2018-10-16 DIAGNOSIS — Z23 Encounter for immunization: Secondary | ICD-10-CM

## 2018-10-16 NOTE — Patient Instructions (Signed)
It was nice seeing you again today.  You were seen in clinic for a flu shot and referral to cardiology.  As we reviewed, your cardiometabolic score performed at pain management clinic may possibly indicate some degree of blockage.  I am placing a referral to cardiology for you for further evaluation and you can expect a call within about a week or so regarding scheduling this appointment.  Additionally, I have checked your cholesterol today and will consider starting you on a medication.  In the meantime, please call clinic if you have any questions.

## 2018-10-16 NOTE — Assessment & Plan Note (Signed)
Check lipid panel today, plan to start statin if needed.

## 2018-10-16 NOTE — Assessment & Plan Note (Signed)
Per cardiometabolic score of 91% performed at pain management clinic, indicate possibly some degree of blockage.  Patient and daughter concerned that she may need a referral to cardiology.  No red flags on exam and patient currently without chest pain at rest or with exertion.  She does have history of 2 stents which were placed about 15 years ago, no longer following with cardiology.  Does not take daily aspirin as she is allergic to this, not currently on a statin. - Checking lipid panel -Referral to cardiology for further evaluation, consider stress test

## 2018-10-16 NOTE — Progress Notes (Signed)
   Subjective:   Patient ID: [name]    DOB: Jul 16, 1951, 67 y.o. female   MRN: [id number]  CC: discuss referral, flu shot  HPI: [name] is a 67 y.o. female who presents to clinic today for the following issue.  Coronary artery disease Follows at pain management for rheumatoid arthritis.  Performed some testing and calculated a cardiometabolic score of 88% indicating possibly some degree of blockage.  Patient and her daughter would like referral to cardiology.  She states she has had 2 cardiac catheterizations in the past (both were over 15 years ago).  She does not have stents in place.  Has previously been seen by cardiology at that time but not recently.  He is not taking a daily aspirin as she is allergic to it.  Hyperlipidemia Used to take cholesterol medication over 15 years ago which was stopped but she is not sure why.  She does not recall intolerance to the medication.   ROS: Denies chest pain, shortness of breath, palpitations, leg swelling.    Social: pt is a former smoker, quit about 9-10 years ago Medications reviewed. Objective:   BP 122/74   Pulse 90   Temp 97.7 F (36.5 C) (Oral)   Ht 5\' 6"  (1.676 m)   Wt 218 lb (98.9 kg)   BMI 35.19 kg/m  Vitals and nursing note reviewed.  General: 67 year old female, NAD HEENT: NCAT, EOMI, PERRL, MMM, oropharynx clear Neck: supple, no JVD CV: RRR no MRG  Lungs: CTAB, normal effort  Abdomen: soft, NTND, +bs  Skin: warm, dry, no rash Extremities: warm and well perfused  Assessment & Plan:   HYPERLIPIDEMIA Check lipid panel today, plan to start statin if needed.  Coronary artery disease Per cardiometabolic score of 11% performed at pain management clinic, indicate possibly some degree of blockage.  Patient and daughter concerned that she may need a referral to cardiology.  No red flags on exam and patient currently without chest pain at rest or with exertion.  She does have history of 2 stents which were placed  about 15 years ago, no longer following with cardiology.  Does not take daily aspirin as she is allergic to this, not currently on a statin. - Checking lipid panel -Referral to cardiology for further evaluation, consider stress test  Health maintenance: -Flu shot given today  Orders Placed This Encounter  Procedures  . Flu Vaccine QUAD 36+ mos IM  . Lipid Panel  . Ambulatory referral to Cardiology    Referral Priority:   Routine    Referral Type:   Consultation    Referral Reason:   Specialty Services Required    Requested Specialty:   Cardiology    Number of Visits Requested:   1   Lovenia Kim, MD [place]

## 2018-10-17 LAB — LIPID PANEL
CHOLESTEROL TOTAL: 189 mg/dL (ref 100–199)
Chol/HDL Ratio: 3.2 ratio (ref 0.0–4.4)
HDL: 59 mg/dL (ref >39)
LDL Calculated: 101 mg/dL — ABNORMAL HIGH (ref 0–99)
TRIGLYCERIDES: 146 mg/dL (ref 0–149)
VLDL CHOLESTEROL CAL: 29 mg/dL (ref 5–40)

## 2018-11-06 ENCOUNTER — Other Ambulatory Visit: Payer: Self-pay | Admitting: Neurology

## 2018-11-20 ENCOUNTER — Other Ambulatory Visit: Payer: Self-pay | Admitting: *Deleted

## 2018-11-20 ENCOUNTER — Other Ambulatory Visit: Payer: Self-pay | Admitting: Internal Medicine

## 2018-11-20 ENCOUNTER — Other Ambulatory Visit: Payer: Self-pay | Admitting: Psychiatry

## 2018-11-20 DIAGNOSIS — Z1231 Encounter for screening mammogram for malignant neoplasm of breast: Secondary | ICD-10-CM

## 2018-11-26 NOTE — Progress Notes (Signed)
Cardiology Office Note   Date:  12/02/2018   ID:  Paula, Landry Aug 24, 1951, MRN [id number]  PCP:  Lovenia Kim, MD  Cardiologist:   Jenkins Rouge, MD   No chief complaint on file.     History of Present Illness: [name] is a 68 y.o. female who presents for consultation regarding CAD. Referred by Dr Reesa Chew [facility] Seen in past by cardiology and has had 2 distant caths with no obstructive CAD. She is former smoker with COPD Sees pain management for fibromyalgia back pain and RA. She has HLD LDL 101 not on medication   No angina Very atypical resting chest pains at night that are muscular She had some sort of testing at the pain clinic that indicated she was  At risk or had CAD.    Past Medical History:  Diagnosis Date  . Asthma   . Chronic low back pain   . Collagen vascular disease ([place])   . 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  . COPD (chronic obstructive pulmonary disease) ([place])   . Depression   . Fibromyalgia    08/18/15  [facility]  . GERD (gastroesophageal reflux disease)   . History of CVA (cerebrovascular accident)    "68 minni srokes"  . Hyperlipidemia   . Hypertension   . Migraine   . Personal history of colonic polyp - adenoma 03/01/2014  . Personality disorder ([place])   . Rheumatoid arthritis ([place])    08/18/15  [facility]  . Seizures ([place])    last seizure 4 years ago  . Sleep apnea    Mild. No need for CPAP.  Marland Kitchen Stroke [facility])     Past Surgical History:  Procedure Laterality Date  . ANKLE SURGERY Left   . APPENDECTOMY    . BACK SURGERY    . BUNIONECTOMY     2 toes  . CARDIAC CATHETERIZATION  2008   Normal. Dr Acie Fredrickson.  . CHOLECYSTECTOMY    . CYST EXCISION     leg  . ESOPHAGOGASTRODUODENOSCOPY (EGD) WITH PROPOFOL N/A 06/27/2018   Procedure: ESOPHAGOGASTRODUODENOSCOPY (EGD) WITH PROPOFOL;  Surgeon: Jonathon Bellows, MD;  Location: [facility] ENDOSCOPY;  Service: Gastroenterology;  Laterality: N/A;  . FLANK MASS  EXCISION    . FOOT TENDON SURGERY  june 2014   both feet  . KNEE SURGERY     Bilateral.  . NOSE SURGERY    . SHOULDER SURGERY     bil.  Marland Kitchen TOTAL ABDOMINAL HYSTERECTOMY       Current Outpatient Medications  Medication Sig Dispense Refill  . Adalimumab 40 MG/0.4ML PNKT INJECT THE CONTENTS OF 1 PEN INTO THE SKIN EVERY 7 DAYS    . albuterol (PROVENTIL HFA;VENTOLIN HFA) 108 (90 Base) MCG/ACT inhaler Inhale 2 puffs into the lungs every 6 (six) hours as needed for wheezing or shortness of breath. 1 Inhaler 2  . carvedilol (COREG) 3.125 MG tablet TAKE 1 TABLET BY MOUTH TWICE A DAY WITH A MEAL 180 tablet 1  . citalopram (CELEXA) 20 MG tablet TAKE 1 TABLET BY MOUTH ONCE DAILY 30 tablet 2  . clotrimazole (LOTRIMIN) 1 % cream Apply 1 application topically 2 (two) times daily. Under breasts 30 g 1  . dicyclomine (BENTYL) 20 MG tablet Take 1 tablet (20 mg total) by mouth 3 (three) times daily as needed for spasms. 20 tablet 0  . ferrous sulfate 325 (65 FE) MG tablet Take 1 tablet (325 mg total) by mouth daily with breakfast.  30 tablet 2  . folic acid (FOLVITE) 1 MG tablet Take 1 mg by mouth.    . gabapentin (NEURONTIN) 300 MG capsule TAKE 3 CAPSULES BY MOUTH TWICE A DAY 180 capsule 5  . HYDROcodone-acetaminophen (NORCO/VICODIN) 5-325 MG tablet Take 1 tablet by mouth 3 (three) times daily as needed for moderate pain. 90 tablet 0  . methotrexate 50 MG/2ML injection     . minocycline (MINOCIN,DYNACIN) 100 MG capsule Take 1 capsule (100 mg total) by mouth 2 (two) times daily. 14 capsule 0  . omeprazole (PRILOSEC) 20 MG capsule TAKE 1 CAPSULE BY MOUTH ONCE DAILY 30 capsule 11  . ondansetron (ZOFRAN ODT) 4 MG disintegrating tablet Take 1 tablet (4 mg total) by mouth every 8 (eight) hours as needed for nausea or vomiting. 20 tablet 0  . ondansetron (ZOFRAN ODT) 4 MG disintegrating tablet Take 1 tablet (4 mg total) by mouth every 8 (eight) hours as needed for nausea or vomiting. 20 tablet 0  .  oxyCODONE-acetaminophen (PERCOCET/ROXICET) 5-325 MG tablet Take 1-2 tablets by mouth every 6 (six) hours as needed for moderate pain or severe pain. 10 tablet 0  . PHENObarbital (LUMINAL) 100 MG tablet TAKE 1 TABLET BY MOUTH ONCE A DAY 90 tablet 1  . tiotropium (SPIRIVA HANDIHALER) 18 MCG inhalation capsule INHALE THE CONTENTS OF 1 CAPSULE VIA HANDIHALER BY MOUTH ONCE DAILY 30 capsule 3  . topiramate (TOPAMAX) 50 MG tablet TAKE 1 TABLET BY MOUTH EVERY MORNING AND2 TABLETS BY MOUTH AT BEDTIME 90 tablet 0  . triamcinolone ointment (KENALOG) 0.5 % APPLY TO AFFECTED AREAS TWICE DAILY AS DIRECTED 60 g 0  . sucralfate (CARAFATE) 1 g tablet Take 1 tablet (1 g total) by mouth 4 (four) times daily. 120 tablet 1   No current facility-administered medications for this visit.     Allergies:   Amoxicillin; Aspirin; Codeine phosphate; Flexeril [cyclobenzaprine]; Ibuprofen; Penicillins; and Tetanus toxoids    Social History:  The patient  reports that she has quit smoking. She has never used smokeless tobacco. She reports that she does not drink alcohol or use drugs.   Family History:  The patient's family history includes Breast cancer in her sister and sister; Cirrhosis in her father and mother; Diabetes in her mother.    ROS:  Please see the history of present illness.   Otherwise, review of systems are positive for none.   All other systems are reviewed and negative.    PHYSICAL EXAM: VS:  BP 132/70   Pulse 82   Ht 5\' 6"  (1.676 m)   Wt 226 lb (102.5 kg)   BMI 36.48 kg/m  , BMI Body mass index is 36.48 kg/m. Affect appropriate Overweight black female  HEENT: normal Neck supple with no adenopathy JVP normal no bruits no thyromegaly Lungs clear with no wheezing and good diaphragmatic motion Heart:  S1/S2 no murmur, no rub, gallop or click PMI normal Abdomen: benighn, BS positve, no tenderness, no AAA no bruit.  No HSM or HJR Distal pulses intact with no bruits No edema Neuro  non-focal Skin warm and dry No muscular weakness    EKG:  05/2018 SR anterolateral T wave changes    Recent Labs: 07/23/2018: ALT 12; BUN 17; Creatinine, Ser 0.86; Hemoglobin 11.9; Platelets 187; Potassium 3.6; Sodium 140    Lipid Panel    Component Value Date/Time   CHOL 189 10/16/2018 1028   TRIG 146 10/16/2018 1028   HDL 59 10/16/2018 1028   CHOLHDL 3.2 10/16/2018 1028  CHOLHDL 3.0 Ratio 11/14/2009 1941   VLDL 17 11/14/2009 1941   LDLCALC 101 (H) 10/16/2018 1028      Wt Readings from Last 3 Encounters:  12/02/18 226 lb (102.5 kg)  10/16/18 218 lb (98.9 kg)  08/04/18 224 lb 12.8 oz (102 kg)      Other studies Reviewed: Additional studies/ records that were reviewed today include: notes from primary labs and ECG.    ASSESSMENT AND PLAN:  1.  CAD:  Risk HLD not on meds. Abnormal ECG f/u lexiscan myovue  2. HLD:  Per primary diet Rx currently 3. RA:  F/u rheumatology on Adalimumab and methotrexate f/u pain management 4. COPD:  Continue spiriva  5. HTN:  Well controlled.  Continue current medications and low sodium Dash type diet.      Current medicines are reviewed at length with the patient today.  The patient does not have concerns regarding medicines.  The following changes have been made:  no change  Labs/ tests ordered today include: Lexi myovue Calcium Score offered but patient deferred due to cost   Orders Placed This Encounter  Procedures  . MYOCARDIAL PERFUSION IMAGING     Disposition:   FU with cardiology PRN      Signed, Jenkins Rouge, MD  12/02/2018 3:49 PM    [facility] [place], [place], [place]  [address] Phone: [phone]; Fax: [phone]

## 2018-12-01 ENCOUNTER — Other Ambulatory Visit: Payer: Self-pay | Admitting: Family Medicine

## 2018-12-01 DIAGNOSIS — I10 Essential (primary) hypertension: Secondary | ICD-10-CM

## 2018-12-02 ENCOUNTER — Ambulatory Visit (INDEPENDENT_AMBULATORY_CARE_PROVIDER_SITE_OTHER): Payer: Medicare Other | Admitting: Cardiovascular Disease

## 2018-12-02 DIAGNOSIS — R079 Chest pain, unspecified: Secondary | ICD-10-CM

## 2018-12-02 NOTE — Patient Instructions (Addendum)
Medication Instructions:   If you need a refill on your cardiac medications before your next appointment, please call your pharmacy.   Lab work:  If you have labs (blood work) drawn today and your tests are completely normal, you will receive your results only by: . MyChart Message (if you have MyChart) OR . A paper copy in the mail If you have any lab test that is abnormal or we need to change your treatment, we will call you to review the results.  Testing/Procedures: Your physician has requested that you have a lexiscan myoview. For further information please visit [email or web address]. Please follow instruction sheet, as given.  Follow-Up: At [facility], you and your health needs are our priority.  As part of our continuing mission to provide you with exceptional heart care, we have created designated Provider Care Teams.  These Care Teams include your primary Cardiologist (physician) and Advanced Practice Providers (APPs -  Physician Assistants and Nurse Practitioners) who all work together to provide you with the care you need, when you need it. Your physician recommends that you schedule a follow-up appointment as needed with Dr. Nishan.     

## 2018-12-09 ENCOUNTER — Other Ambulatory Visit: Payer: Self-pay | Admitting: Family Medicine

## 2018-12-09 ENCOUNTER — Other Ambulatory Visit: Payer: Self-pay | Admitting: Neurology

## 2018-12-09 ENCOUNTER — Telehealth (HOSPITAL_COMMUNITY): Payer: Self-pay

## 2018-12-09 DIAGNOSIS — F32A Depression, unspecified: Secondary | ICD-10-CM

## 2018-12-09 DIAGNOSIS — F329 Major depressive disorder, single episode, unspecified: Secondary | ICD-10-CM

## 2018-12-09 NOTE — Telephone Encounter (Signed)
Attempted to contact the patient to give her instructions for her stress test. Her answering machine was full and not taking anymore messages. S.Wise Fees EMTP

## 2018-12-11 ENCOUNTER — Ambulatory Visit (HOSPITAL_COMMUNITY): Payer: Medicare Other | Attending: Cardiovascular Disease

## 2018-12-11 DIAGNOSIS — R079 Chest pain, unspecified: Secondary | ICD-10-CM

## 2018-12-11 LAB — MYOCARDIAL PERFUSION IMAGING
LV dias vol: 66 mL (ref 46–106)
LV sys vol: 27 mL
Peak HR: 88 {beats}/min
Rest HR: 62 {beats}/min
SDS: 2
SRS: 1
SSS: 3
TID: 1.07

## 2018-12-11 MED ORDER — TECHNETIUM TC 99M TETROFOSMIN IV KIT
9.8000 | PACK | Freq: Once | INTRAVENOUS | Status: AC | PRN
  Administered 2018-12-11: 9.8 via INTRAVENOUS
  Filled 2018-12-11: qty 10

## 2018-12-11 MED ORDER — TECHNETIUM TC 99M TETROFOSMIN IV KIT
31.0000 | PACK | Freq: Once | INTRAVENOUS | Status: AC | PRN
  Administered 2018-12-11: 31 via INTRAVENOUS
  Filled 2018-12-11: qty 31

## 2018-12-11 MED ORDER — ADENOSINE (DIAGNOSTIC) 3 MG/ML IV SOLN
0.5600 mg/kg | Freq: Once | INTRAVENOUS | Status: AC
  Administered 2018-12-11: 57.3 mg via INTRAVENOUS

## 2018-12-18 ENCOUNTER — Ambulatory Visit
Admission: RE | Admit: 2018-12-18 | Discharge: 2018-12-18 | Disposition: A | Discharge status: Home / Self Care | Payer: Medicare Other | Source: Ambulatory Visit | Attending: Internal Medicine | Admitting: Internal Medicine

## 2018-12-18 DIAGNOSIS — Z1231 Encounter for screening mammogram for malignant neoplasm of breast: Secondary | ICD-10-CM

## 2018-12-23 ENCOUNTER — Telehealth: Payer: Self-pay | Admitting: Cardiovascular Disease

## 2018-12-23 NOTE — Telephone Encounter (Signed)
New message   Patient's daughter is calling to get results of patient's Myocardial perfusion.

## 2018-12-23 NOTE — Telephone Encounter (Signed)
Called patient's daughter back with results.

## 2019-01-09 ENCOUNTER — Other Ambulatory Visit: Payer: Self-pay | Admitting: Family Medicine

## 2019-01-09 DIAGNOSIS — J449 Chronic obstructive pulmonary disease, unspecified: Secondary | ICD-10-CM

## 2019-01-09 DIAGNOSIS — G8929 Other chronic pain: Secondary | ICD-10-CM

## 2019-01-13 ENCOUNTER — Other Ambulatory Visit: Payer: Self-pay

## 2019-01-13 MED ORDER — TRIAMCINOLONE ACETONIDE 0.5 % EX OINT: TOPICAL_OINTMENT | CUTANEOUS | 0 refills | Status: DC

## 2019-01-13 NOTE — Telephone Encounter (Signed)
Hey Page, was this for a specific medication or was the pharmacy requesting a call back? Let me know and I am happy to call, thanks!

## 2019-01-30 ENCOUNTER — Other Ambulatory Visit: Payer: Self-pay | Admitting: Neurology

## 2019-02-04 ENCOUNTER — Other Ambulatory Visit: Payer: Self-pay | Admitting: Neurology

## 2019-02-05 DIAGNOSIS — M85859 Other specified disorders of bone density and structure, unspecified thigh: Secondary | ICD-10-CM | POA: Insufficient documentation

## 2019-03-03 ENCOUNTER — Telehealth: Payer: Self-pay | Admitting: *Deleted

## 2019-03-03 ENCOUNTER — Encounter: Payer: Self-pay | Admitting: *Deleted

## 2019-03-03 NOTE — Telephone Encounter (Signed)
I called spoke with daughter and pt.  Relayed that Due to current COVID 19 pandemic, our office is severely reducing in office visits until further notice, in order to minimize the risk to our patients and healthcare providers.  Pt understands that although there may be some limitations with this type of visit, we will take all precautions to reduce any security or privacy concerns.  Pt understands that this will be treated like an in office visit and we will file with pt's insurance.   Consented to [email or web address] VV.  Email: [email or web address]. Email sent. Chart updated.

## 2019-03-04 ENCOUNTER — Encounter: Payer: Self-pay | Admitting: Internal Medicine

## 2019-03-04 ENCOUNTER — Other Ambulatory Visit: Payer: Self-pay

## 2019-03-04 ENCOUNTER — Encounter: Payer: Self-pay | Admitting: Adult Health

## 2019-03-04 ENCOUNTER — Ambulatory Visit (INDEPENDENT_AMBULATORY_CARE_PROVIDER_SITE_OTHER): Payer: Medicare Other | Admitting: Adult Health

## 2019-03-04 DIAGNOSIS — R569 Unspecified convulsions: Secondary | ICD-10-CM

## 2019-03-04 MED ORDER — TOPIRAMATE 50 MG PO TABS: ORAL_TABLET | ORAL | 11 refills | Status: DC

## 2019-03-04 MED ORDER — PHENOBARBITAL 100 MG PO TABS: 100.0000 mg | ORAL_TABLET | Freq: Every day | ORAL | 1 refills | Status: DC

## 2019-03-04 NOTE — Progress Notes (Signed)
PATIENT: [name] DOB: October 05, 1951  REASON FOR VISIT: follow up HISTORY FROM: patient  Virtual Visit via Video Note  I connected with [name] on 03/04/19 at  2:00 PM EDT by a video enabled telemedicine application located remotely at [facility] and verified that I am speaking with the correct person using two identifiers who was located at their own home.   I discussed the limitations of evaluation and management by telemedicine and the availability of in person appointments. The patient expressed understanding and agreed to proceed.   PATIENT: [name] DOB: June 15, 1951  REASON FOR VISIT: follow up HISTORY FROM: patient  HISTORY OF PRESENT ILLNESS: Today 03/04/19:  [name] is a 68 year old female with a history of seizures and headaches.  She returns today for a virtual visit..  She has not been seen by our office in over a year.  She states that she is not had any seizures.  Patient's seizures are controlled on phenobarbital.  She states that she continues to have approximately 4 headaches a week.  Her headaches occur on the top of the head.  She reports on occasion she will have nausea and vomiting and photophobia and phonophobia.  She states that she takes approximately 2 BC powders when she has a headache.  Reports that she is able to complete all ADLs independently.  She operates a Teacher, music.  She is doing fine today for virtual visit.  HISTORY [name] is a 68 year old right-handed white female with a history of seizures that have been well controlled on phenobarbital, and a history of headaches.  The patient usually has 2-3 headaches a week, he she has been on Topamax for this.  The patient also gives a history of fibromyalgia and rheumatoid arthritis.  She indicates that over the last several months she has developed pain in the neck and down the right arm, she has undergone MRI of the cervical spine done on 13 July 2017 that  shows mild multilevel cervical degenerative disc disease.  This was unchanged from April 2016.  The patient indicates that she began having problems with falling over the last 4 or 5 weeks, she has fallen on 3 occasions.  On one fall she did hit her head, she has had daily headaches over the last 3 weeks since she hit her head.  The patient does report some dizziness and some changes in vision with blurred vision.  She has numbness on the entire right side of the body including the arm and the leg.  She denies issues controlling the bowels of the bladder.  She has not had any confusion.  She has pain in the right elbow and right shoulder when she moves the arm.  The pain is much better when the arm is resting.  She has pain into the right shoulder and neck.  She returns for an evaluation.  REVIEW OF SYSTEMS: Out of a complete 14 system review of symptoms, the patient complains only of the following symptoms, and all other reviewed systems are negative.  See HPI  ALLERGIES: Allergies  Allergen Reactions  . Amoxicillin     REACTION: hives  . Aspirin     REACTION: rash  . Codeine Phosphate     REACTION: N/V  . Flexeril [Cyclobenzaprine] Nausea And Vomiting    Dizziness   . Ibuprofen Hives  . Penicillins     REACTION: hives  . Tetanus Toxoids Swelling    HOME MEDICATIONS: Outpatient Medications Prior to  Visit  Medication Sig Dispense Refill  . Adalimumab 40 MG/0.4ML PNKT INJECT THE CONTENTS OF 1 PEN INTO THE SKIN EVERY 7 DAYS    . albuterol (PROVENTIL HFA;VENTOLIN HFA) 108 (90 Base) MCG/ACT inhaler INHALE 2 PUFFS INTO THE LUNGS EVERY 6 HOURS AS NEEDED FOR WHEEZING ORSHORTNESS OF BREATH. 18 g 3  . carvedilol (COREG) 3.125 MG tablet TAKE 1 TABLET BY MOUTH TWICE A DAY WITH A MEAL 180 tablet 1  . citalopram (CELEXA) 20 MG tablet TAKE 1 TABLET BY MOUTH ONCE A DAY 30 tablet 2  . clotrimazole (LOTRIMIN) 1 % cream Apply 1 application topically 2 (two) times daily. Under breasts 30 g 1  .  dicyclomine (BENTYL) 20 MG tablet Take 1 tablet (20 mg total) by mouth 3 (three) times daily as needed for spasms. 20 tablet 0  . ferrous sulfate 325 (65 FE) MG tablet Take 1 tablet (325 mg total) by mouth daily with breakfast. 30 tablet 2  . folic acid (FOLVITE) 1 MG tablet Take 1 mg by mouth.    . gabapentin (NEURONTIN) 300 MG capsule TAKE 3 CAPSULES BY MOUTH TWICE A DAY 180 capsule 5  . HYDROcodone-acetaminophen (NORCO/VICODIN) 5-325 MG tablet Take 1 tablet by mouth 3 (three) times daily as needed for moderate pain. 90 tablet 0  . methotrexate 50 MG/2ML injection     . minocycline (MINOCIN,DYNACIN) 100 MG capsule Take 1 capsule (100 mg total) by mouth 2 (two) times daily. 14 capsule 0  . omeprazole (PRILOSEC) 20 MG capsule TAKE 1 CAPSULE BY MOUTH ONCE DAILY 30 capsule 11  . ondansetron (ZOFRAN ODT) 4 MG disintegrating tablet Take 1 tablet (4 mg total) by mouth every 8 (eight) hours as needed for nausea or vomiting. 20 tablet 0  . ondansetron (ZOFRAN ODT) 4 MG disintegrating tablet Take 1 tablet (4 mg total) by mouth every 8 (eight) hours as needed for nausea or vomiting. 20 tablet 0  . oxyCODONE-acetaminophen (PERCOCET/ROXICET) 5-325 MG tablet Take 1-2 tablets by mouth every 6 (six) hours as needed for moderate pain or severe pain. 10 tablet 0  . PHENObarbital (LUMINAL) 100 MG tablet TAKE 1 TABLET BY MOUTH ONCE DAILY 90 tablet 0  . sucralfate (CARAFATE) 1 g tablet Take 1 tablet (1 g total) by mouth 4 (four) times daily. 120 tablet 1  . tiotropium (SPIRIVA HANDIHALER) 18 MCG inhalation capsule INHALE THE CONTENTS OF 1 CAPSULE VIA HANDIHALER BY MOUTH ONCE DAILY 30 capsule 3  . topiramate (TOPAMAX) 50 MG tablet TAKE 1 TABLET BY MOUTH EVERY MORNING AND2 TABS BY MOUTH AT BEDTIME DAILY 90 tablet 0  . triamcinolone ointment (KENALOG) 0.5 % APPLY TO AFFECTED AREAS TWICE DAILY AS DIRECTED 60 g 0   No facility-administered medications prior to visit.     PAST MEDICAL HISTORY: Past Medical History:   Diagnosis Date  . Asthma   . Chronic low back pain   . Collagen vascular disease ([place])   . 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  . COPD (chronic obstructive pulmonary disease) ([place])   . Depression   . Fibromyalgia    [facility]/18/15  [facility]  . GERD (gastroesophageal reflux disease)   . History of CVA (cerebrovascular accident)    "85 minni srokes"  . Hyperlipidemia   . Hypertension   . Migraine   . Personal history of colonic polyp - adenoma 03/01/2014  . Personality disorder ([place])   . Rheumatoid arthritis ([place])    08/18/15  [facility]  . Seizures ([place])  last seizure 4 years ago  . Sleep apnea    Mild. No need for CPAP.  Marland Kitchen Stroke [facility])     PAST SURGICAL HISTORY: Past Surgical History:  Procedure Laterality Date  . ANKLE SURGERY Left   . APPENDECTOMY    . BACK SURGERY    . BUNIONECTOMY     2 toes  . CARDIAC CATHETERIZATION  2008   Normal. Dr Acie Fredrickson.  . CHOLECYSTECTOMY    . CYST EXCISION     leg  . ESOPHAGOGASTRODUODENOSCOPY (EGD) WITH PROPOFOL N/A 06/27/2018   Procedure: ESOPHAGOGASTRODUODENOSCOPY (EGD) WITH PROPOFOL;  Surgeon: Jonathon Bellows, MD;  Location: [facility] ENDOSCOPY;  Service: Gastroenterology;  Laterality: N/A;  . FLANK MASS EXCISION    . FOOT TENDON SURGERY  june 2014   both feet  . KNEE SURGERY     Bilateral.  . NOSE SURGERY    . SHOULDER SURGERY     bil.  Marland Kitchen TOTAL ABDOMINAL HYSTERECTOMY      FAMILY HISTORY: Family History  Problem Relation Age of Onset  . Cirrhosis Mother   . Diabetes Mother   . Cirrhosis Father   . Breast cancer Sister 85  . Breast cancer Sister 54  . Colon cancer Neg Hx     SOCIAL HISTORY: Social History   Socioeconomic History  . Marital status: Married    Spouse name: Clearence   . Number of children: 1  . Years of education: 58  . Highest education level: Not on file  Occupational History  . Occupation: Unemployed   Social Needs  . Financial resource strain: Not on file  . Food insecurity:     Worry: Not on file    Inability: Not on file  . Transportation needs:    Medical: Not on file    Non-medical: Not on file  Tobacco Use  . Smoking status: Former Research scientist (life sciences)  . Smokeless tobacco: Never Used  Substance and Sexual Activity  . Alcohol use: No  . Drug use: No  . Sexual activity: Not on file  Lifestyle  . Physical activity:    Days per week: Not on file    Minutes per session: Not on file  . Stress: Not on file  Relationships  . Social connections:    Talks on phone: Not on file    Gets together: Not on file    Attends religious service: Not on file    Active member of club or organization: Not on file    Attends meetings of clubs or organizations: Not on file    Relationship status: Not on file  . Intimate partner violence:    Fear of current or ex partner: Not on file    Emotionally abused: Not on file    Physically abused: Not on file    Forced sexual activity: Not on file  Other Topics Concern  . Not on file  Social History Narrative   Patient lives at home with her husband [name].    Patient is currently not working.    Patient has 1 child.    Patient has a 11th grade education.       PHYSICAL EXAM  There were no vitals filed for this visit. There is no height or weight on file to calculate BMI.  Generalized: Well developed, in no acute distress   Neurological examination  Mentation: Alert oriented to time, place, history taking. Follows all commands speech and language fluent Cranial nerve II-XII:  Extraocular movements were full. Facial symmetry noted.  Uvula tongue midline. Head turning and shoulder shrug  were normal and symmetric. Motor: The motor testing reveals 5 over 5 strength of all 4 extremities subjectively per patient Sensory: Sensory testing is intact to soft touch on all 4 extremities subjectively per patient Coordination: Cerebellar testing reveals good finger-nose-finger Gait and station: Patient is able to stand without assistance  Reflexes: UTA   DIAGNOSTIC DATA (LABS, IMAGING, TESTING) - I reviewed patient records, labs, notes, testing and imaging myself where available.  Lab Results  Component Value Date   WBC 8.3 07/23/2018   HGB 11.9 (L) 07/23/2018   HCT 35.8 07/23/2018   MCV 81.5 07/23/2018   PLT 187 07/23/2018      Component Value Date/Time   NA 140 07/23/2018 1251   NA 141 09/05/2017 1532   NA 140 09/01/2014 1600   K 3.6 07/23/2018 1251   K 4.3 09/01/2014 1600   CL 106 07/23/2018 1251   CL 111 (H) 09/01/2014 1600   CO2 22 07/23/2018 1251   CO2 18 (L) 09/01/2014 1600   GLUCOSE 103 (H) 07/23/2018 1251   GLUCOSE 78 09/01/2014 1600   BUN 17 07/23/2018 1251   BUN 14 09/05/2017 1532   BUN 10 09/01/2014 1600   CREATININE 0.86 07/23/2018 1251   CREATININE 0.63 01/24/2015 1449   CALCIUM 8.7 (L) 07/23/2018 1251   CALCIUM 8.3 (L) 09/01/2014 1600   PROT 8.3 (H) 07/23/2018 1251   PROT 7.8 09/05/2017 1532   PROT 6.8 08/16/2014 0431   ALBUMIN 3.7 07/23/2018 1251   ALBUMIN 3.7 09/05/2017 1532   ALBUMIN 2.7 (L) 08/16/2014 0431   AST 19 07/23/2018 1251   AST 16 08/16/2014 0431   ALT 12 07/23/2018 1251   ALT 17 08/16/2014 0431   ALKPHOS 100 07/23/2018 1251   ALKPHOS 86 08/16/2014 0431   BILITOT 0.7 07/23/2018 1251   BILITOT <0.2 09/05/2017 1532   BILITOT 0.3 08/16/2014 0431   GFRNONAA >60 07/23/2018 1251   GFRNONAA >60 09/01/2014 1600   GFRNONAA >60 07/13/2013 1013   GFRNONAA 84 08/26/2012 1520   GFRAA >60 07/23/2018 1251   GFRAA >60 09/01/2014 1600   GFRAA >60 07/13/2013 1013   GFRAA >89 08/26/2012 1520   Lab Results  Component Value Date   CHOL 189 10/16/2018   HDL 59 10/16/2018   LDLCALC 101 (H) 10/16/2018   TRIG 146 10/16/2018   CHOLHDL 3.2 10/16/2018   Lab Results  Component Value Date   HGBA1C 5.6 12/28/2013   Lab Results  Component Value Date   VITAMINB12 483 09/05/2017   Lab Results  Component Value Date   TSH 2.520 02/11/2014      ASSESSMENT AND PLAN 68 y.o. year  old female  has a past medical history of Asthma, Chronic low back pain, Collagen vascular disease ([place]), Conversion disorder with seizures or convulsions, COPD (chronic obstructive pulmonary disease) ([place]), Depression, Fibromyalgia, GERD (gastroesophageal reflux disease), History of CVA (cerebrovascular accident), Hyperlipidemia, Hypertension, Migraine, Personal history of colonic polyp - adenoma (03/01/2014), Personality disorder ([place]), Rheumatoid arthritis ([place]), Seizures ([place]), Sleep apnea, and Stroke ([place]). here with:  1.  Seizures 2.  Headache  The patient will continue on phenobarbital.  I have placed an order for blood work however I did advise the patient that she can come in when she feels comfortable due to COVID-19 -lab work was not urgent.  In regards to her headaches we discussed increasing Topamax however she declined at this time.  I did caution the patient not to overuse  BC powder.  She voiced understanding.  She will follow-up in 6 months or sooner as needed.   I spent 15 minutes with the patient this time was spent reviewing the chart prior to the appointment, discussing diagnosis and plan of care.   Ward Givens, MSN, NP-C 03/04/2019, 1:58 PM [facility] [address], [place], [place] [address] [phone]

## 2019-03-04 NOTE — Progress Notes (Signed)
I have read the note, and I agree with the clinical assessment and plan.  Dhriti Fales K Drayton Tieu   

## 2019-03-09 ENCOUNTER — Other Ambulatory Visit: Payer: Self-pay | Admitting: Family Medicine

## 2019-03-09 DIAGNOSIS — F329 Major depressive disorder, single episode, unspecified: Secondary | ICD-10-CM

## 2019-03-09 DIAGNOSIS — F32A Depression, unspecified: Secondary | ICD-10-CM

## 2019-03-30 ENCOUNTER — Encounter: Payer: Self-pay | Admitting: Family Medicine

## 2019-03-30 ENCOUNTER — Ambulatory Visit (INDEPENDENT_AMBULATORY_CARE_PROVIDER_SITE_OTHER): Payer: Medicare Other | Admitting: Family Medicine

## 2019-03-30 ENCOUNTER — Other Ambulatory Visit: Payer: Self-pay

## 2019-03-30 VITALS — BP 132/74 | HR 80

## 2019-03-30 DIAGNOSIS — H9203 Otalgia, bilateral: Secondary | ICD-10-CM | POA: Diagnosis present

## 2019-03-30 HISTORY — DX: Otalgia, bilateral: H92.03

## 2019-03-30 MED ORDER — NEOMYCIN-POLYMYXIN-HC 1 % OT SOLN: 3.0000 [drp] | Freq: Four times a day (QID) | OTIC | 0 refills | Status: DC

## 2019-03-30 NOTE — Assessment & Plan Note (Addendum)
Patient presented with bilateral ear pain for the past week.  No change in hearing fever, chills or other worrisome symptoms.  Recent dental work-no signs of infection. On exam, patient had significant bilateral cerumen impaction.  Ears were cleaned, tympanic membrane was visualized no erythema or fluid level noted behind the tympanic membrane that would be concerning for otitis media.  Mild erythematous bilateral external canal likely associated with chronic cerumen impaction. --Prescribed Cortisporin to be used in both ears for the next few days. --Discussed ears cleaning methods, will follow up on as-needed basis

## 2019-03-30 NOTE — Patient Instructions (Signed)
Earwax Buildup, Adult  The ears produce a substance called earwax that helps keep bacteria out of the ear and protects the skin in the ear canal. Occasionally, earwax can build up in the ear and cause discomfort or hearing loss.  What increases the risk?  This condition is more likely to develop in people who:  · Are female.  · Are elderly.  · Naturally produce more earwax.  · Clean their ears often with cotton swabs.  · Use earplugs often.  · Use in-ear headphones often.  · Wear hearing aids.  · Have narrow ear canals.  · Have earwax that is overly thick or sticky.  · Have eczema.  · Are dehydrated.  · Have excess hair in the ear canal.  What are the signs or symptoms?  Symptoms of this condition include:  · Reduced or muffled hearing.  · A feeling of fullness in the ear or feeling that the ear is plugged.  · Fluid coming from the ear.  · Ear pain.  · Ear itch.  · Ringing in the ear.  · Coughing.  · An obvious piece of earwax that can be seen inside the ear canal.  How is this diagnosed?  This condition may be diagnosed based on:  · Your symptoms.  · Your medical history.  · An ear exam. During the exam, your health care provider will look into your ear with an instrument called an otoscope.  You may have tests, including a hearing test.  How is this treated?  This condition may be treated by:  · Using ear drops to soften the earwax.  · Having the earwax removed by a health care provider. The health care provider may:  ? Flush the ear with water.  ? Use an instrument that has a loop on the end (curette).  ? Use a suction device.  · Surgery to remove the wax buildup. This may be done in severe cases.  Follow these instructions at home:    · Take over-the-counter and prescription medicines only as told by your health care provider.  · Do not put any objects, including cotton swabs, into your ear. You can clean the opening of your ear canal with a washcloth or facial tissue.  · Follow instructions from your health care  provider about cleaning your ears. Do not over-clean your ears.  · Drink enough fluid to keep your urine clear or pale yellow. This will help to thin the earwax.  · Keep all follow-up visits as told by your health care provider. If earwax builds up in your ears often or if you use hearing aids, consider seeing your health care provider for routine, preventive ear cleanings. Ask your health care provider how often you should schedule your cleanings.  · If you have hearing aids, clean them according to instructions from the manufacturer and your health care provider.  Contact a health care provider if:  · You have ear pain.  · You develop a fever.  · You have blood, pus, or other fluid coming from your ear.  · You have hearing loss.  · You have ringing in your ears that does not go away.  · Your symptoms do not improve with treatment.  · You feel like the room is spinning (vertigo).  Summary  · Earwax can build up in the ear and cause discomfort or hearing loss.  · The most common symptoms of this condition include reduced or muffled hearing and a feeling of   fullness in the ear or feeling that the ear is plugged.  · This condition may be diagnosed based on your symptoms, your medical history, and an ear exam.  · This condition may be treated by using ear drops to soften the earwax or by having the earwax removed by a health care provider.  · Do not put any objects, including cotton swabs, into your ear. You can clean the opening of your ear canal with a washcloth or facial tissue.  This information is not intended to replace advice given to you by your health care provider. Make sure you discuss any questions you have with your health care provider.  Document Released: 11/15/2004 Document Revised: 09/19/2017 Document Reviewed: 12/19/2016  Elsevier Interactive Patient Education © 2019 Elsevier Inc.

## 2019-03-30 NOTE — Progress Notes (Signed)
Subjective:    Patient ID: [name], female    DOB: February 17, 1951, 68 y.o.   MRN: [id number]   CC: Bilateral ear pain  HPI: Patient is a 68 year old female with a complex past medical history who presents today complaining of bilateral ear pain.  Patient reports that symptoms started about a week ago and have gradually worsened.  She reports left hip pain is more severe than the right.  Patient denies any drainage, fever, chills, nausea or vomiting.  She does endorse mild congestion associated with her ear pain.  Patient recently had 9 teeth removed but pain is well controlled.  Smoking status reviewed   ROS: all other systems were reviewed and are negative other than in the HPI   Past Medical History:  Diagnosis Date  . Asthma   . Chronic low back pain   . Collagen vascular disease ([place])   . 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  . COPD (chronic obstructive pulmonary disease) ([place])   . Depression   . Fibromyalgia    08/18/15  [facility]  . GERD (gastroesophageal reflux disease)   . History of CVA (cerebrovascular accident)    "5 minni srokes"  . Hyperlipidemia   . Hypertension   . Migraine   . Personal history of colonic polyp - adenoma 03/01/2014  . Personality disorder ([place])   . Rheumatoid arthritis ([place])    08/18/15  [facility]  . Seizures ([place])    last seizure 4 years ago  . Sleep apnea    Mild. No need for CPAP.  Marland Kitchen Stroke [facility])     Past Surgical History:  Procedure Laterality Date  . ANKLE SURGERY Left   . APPENDECTOMY    . BACK SURGERY    . BUNIONECTOMY     2 toes  . CARDIAC CATHETERIZATION  2008   Normal. Dr Acie Fredrickson.  . CHOLECYSTECTOMY    . CYST EXCISION     leg  . ESOPHAGOGASTRODUODENOSCOPY (EGD) WITH PROPOFOL N/A 06/27/2018   Procedure: ESOPHAGOGASTRODUODENOSCOPY (EGD) WITH PROPOFOL;  Surgeon: Jonathon Bellows, MD;  Location: [facility] ENDOSCOPY;  Service: Gastroenterology;  Laterality: N/A;  . FLANK MASS EXCISION    . FOOT TENDON SURGERY   june 2014   both feet  . KNEE SURGERY     Bilateral.  . NOSE SURGERY    . SHOULDER SURGERY     bil.  Marland Kitchen TOTAL ABDOMINAL HYSTERECTOMY      Past medical history, surgical, family, and social history reviewed and updated in the EMR as appropriate.  Objective:  BP 132/74   Pulse 80   Vitals and nursing note reviewed  General: NAD, pleasant, able to participate in exam HEENT: Significant cerumen impaction bilaterally, no erythema in external canal, unable to visualize TMs bilaterally Cardiac: RRR, normal heart sounds, no murmurs. 2+ radial and PT pulses bilaterally Respiratory: CTAB, normal effort, No wheezes, rales or rhonchi Abdomen: soft, nontender, nondistended, no hepatic or splenomegaly, +BS Extremities: no edema or cyanosis. WWP. Skin: warm and dry, no rashes noted Neuro: alert and oriented x4, no focal deficits Psych: Normal affect and mood   Assessment & Plan:   Ear pain, bilateral Patient presented with bilateral ear pain for the past week.  No change in hearing fever, chills or other worrisome symptoms.  Recent dental work-no signs of infection. On exam, patient had significant bilateral cerumen impaction.  Ears were cleaned, tympanic membrane was visualized no erythema or fluid level noted behind the tympanic membrane that would  be concerning for otitis media.  Mild erythematous bilateral external canal likely associated with chronic cerumen impaction. --Prescribed Cortisporin to be used in both ears for the next few days. --Discussed ears cleaning methods, will follow up on as-needed basis     Marjie Skiff, MD [place] PGY-3

## 2019-05-04 NOTE — Progress Notes (Signed)
PATIENT: [name] DOB: 12/05/1950  REASON FOR VISIT: follow up HISTORY FROM: patient  HISTORY OF PRESENT ILLNESS: Today 05/05/19  [name] is a 68 year old female with history of seizures and headaches.  For headaches, she remains on Topamax.  For seizures, she is taking phenobarbital.  Today, she is complaining of a new problem.  She reports for the last few weeks she has had right-sided neck pain, causing her to develop a headache.  She reports she woke up one morning and her neck was stiff.  The pain is on the right side, radiates forward on right side.  She indicates this feels different from her migraines.  She is not having any numbness or tingling in her arms or legs.  She denies any changes in her bowels or bladder.  She has not fallen recently.  She is not having any new visual disturbances. The right side of her neck is tender to touch, worse with movement to the right side.  She denies any recent illness or fever.  She says she has not had recurrent seizure.  She does not feel that the Topamax is beneficial for her migraines.  She says she has about 2-3 migraines a month.  She presents today for follow-up accompanied by her daughter.  HISTORY  03/04/2019 MM: [name] is a 68 year old female with a history of seizures and headaches.  She returns today for a virtual visit..  She has not been seen by our office in over a year.  She states that she is not had any seizures.  Patient's seizures are controlled on phenobarbital.  She states that she continues to have approximately 4 headaches a week.  Her headaches occur on the top of the head.  She reports on occasion she will have nausea and vomiting and photophobia and phonophobia.  She states that she takes approximately 2 BC powders when she has a headache.  Reports that she is able to complete all ADLs independently.  She operates a Teacher, music.  She is doing fine today for virtual visit.   REVIEW OF SYSTEMS: Out of a  complete 14 system review of symptoms, the patient complains only of the following symptoms, and all other reviewed systems are negative.  Neck pain, headache  ALLERGIES: Allergies  Allergen Reactions   Amoxicillin     REACTION: hives   Aspirin     REACTION: rash   Codeine Phosphate     REACTION: N/V   Flexeril [Cyclobenzaprine] Nausea And Vomiting    Dizziness    Ibuprofen Hives   Penicillins     REACTION: hives   Tetanus Toxoids Swelling    HOME MEDICATIONS: Outpatient Medications Prior to Visit  Medication Sig Dispense Refill   Adalimumab 40 MG/0.4ML PNKT INJECT THE CONTENTS OF 1 PEN INTO THE SKIN EVERY 7 DAYS     albuterol (PROVENTIL HFA;VENTOLIN HFA) 108 (90 Base) MCG/ACT inhaler INHALE 2 PUFFS INTO THE LUNGS EVERY 6 HOURS AS NEEDED FOR WHEEZING ORSHORTNESS OF BREATH. 18 g 3   carvedilol (COREG) 3.125 MG tablet TAKE 1 TABLET BY MOUTH TWICE A DAY WITH A MEAL 180 tablet 1   citalopram (CELEXA) 20 MG tablet TAKE 1 TABLET BY MOUTH ONCE DAILY 30 tablet 2   clotrimazole (LOTRIMIN) 1 % cream Apply 1 application topically 2 (two) times daily. Under breasts 30 g 1   dicyclomine (BENTYL) 20 MG tablet Take 1 tablet (20 mg total) by mouth 3 (three) times daily as needed for spasms. [place]  tablet 0   ferrous sulfate 325 (65 FE) MG tablet Take 1 tablet (325 mg total) by mouth daily with breakfast. 30 tablet 2   folic acid (FOLVITE) 1 MG tablet Take 1 mg by mouth.     gabapentin (NEURONTIN) 300 MG capsule TAKE 3 CAPSULES BY MOUTH TWICE A DAY 180 capsule 5   HYDROcodone-acetaminophen (NORCO/VICODIN) 5-325 MG tablet Take 1 tablet by mouth 3 (three) times daily as needed for moderate pain. 90 tablet 0   NEOMYCIN-POLYMYXIN-HYDROCORTISONE (CORTISPORIN) 1 % SOLN OTIC solution Place 3 drops into both ears 4 (four) times daily. 10 mL 0   omeprazole (PRILOSEC) 20 MG capsule TAKE 1 CAPSULE BY MOUTH ONCE DAILY 30 capsule 11   ondansetron (ZOFRAN ODT) 4 MG disintegrating tablet Take 1  tablet (4 mg total) by mouth every 8 (eight) hours as needed for nausea or vomiting. 20 tablet 0   tiotropium (SPIRIVA HANDIHALER) 18 MCG inhalation capsule INHALE THE CONTENTS OF 1 CAPSULE VIA HANDIHALER BY MOUTH ONCE DAILY 30 capsule 3   topiramate (TOPAMAX) 50 MG tablet TAKE 1 TABLET BY MOUTH EVERY MORNING AND2 TABS BY MOUTH AT BEDTIME DAILY 90 tablet 11   triamcinolone ointment (KENALOG) 0.5 % APPLY TO AFFECTED AREAS TWICE DAILY AS DIRECTED 60 g 0   PHENObarbital (LUMINAL) 100 MG tablet Take 1 tablet (100 mg total) by mouth daily. 90 tablet 1   methotrexate 50 MG/2ML injection      minocycline (MINOCIN,DYNACIN) 100 MG capsule Take 1 capsule (100 mg total) by mouth 2 (two) times daily. 14 capsule 0   ondansetron (ZOFRAN ODT) 4 MG disintegrating tablet Take 1 tablet (4 mg total) by mouth every 8 (eight) hours as needed for nausea or vomiting. 20 tablet 0   oxyCODONE-acetaminophen (PERCOCET/ROXICET) 5-325 MG tablet Take 1-2 tablets by mouth every 6 (six) hours as needed for moderate pain or severe pain. 10 tablet 0   sucralfate (CARAFATE) 1 g tablet Take 1 tablet (1 g total) by mouth 4 (four) times daily. 120 tablet 1   No facility-administered medications prior to visit.     PAST MEDICAL HISTORY: Past Medical History:  Diagnosis Date   Asthma    Chronic low back pain    Collagen vascular disease ([place])    Conversion disorder with seizures or convulsions    On Phenobarbital.   COPD (chronic obstructive pulmonary disease) ([facility])    Depression    Fibromyalgia    08/18/15  [facility]   GERD (gastroesophageal reflux disease)    History of CVA (cerebrovascular accident)    "18 minni srokes"   Hyperlipidemia    Hypertension    Migraine    Personal history of colonic polyp - adenoma 03/01/2014   Personality disorder ([place])    Rheumatoid arthritis ([place])    08/18/15  [facility]   Seizures ([place])    last seizure 4 years ago   Sleep apnea    Mild. No need for CPAP.   Stroke  [facility])     PAST SURGICAL HISTORY: Past Surgical History:  Procedure Laterality Date   ANKLE SURGERY Left    APPENDECTOMY     BACK SURGERY     BUNIONECTOMY     2 toes   CARDIAC CATHETERIZATION  2008   Normal. Dr Acie Fredrickson.   CHOLECYSTECTOMY     CYST EXCISION     leg   ESOPHAGOGASTRODUODENOSCOPY (EGD) WITH PROPOFOL N/A 06/27/2018   Procedure: ESOPHAGOGASTRODUODENOSCOPY (EGD) WITH PROPOFOL;  Surgeon: Jonathon Bellows, MD;  Location: [facility] ENDOSCOPY;  Service:  Gastroenterology;  Laterality: N/A;   FLANK MASS EXCISION     FOOT TENDON SURGERY  june 2014   both feet   KNEE SURGERY     Bilateral.   NOSE SURGERY     SHOULDER SURGERY     bil.   TOTAL ABDOMINAL HYSTERECTOMY      FAMILY HISTORY: Family History  Problem Relation Age of Onset   Cirrhosis Mother    Diabetes Mother    Cirrhosis Father    Breast cancer Sister 51   Breast cancer Sister 40   Colon cancer Neg Hx     SOCIAL HISTORY: Social History   Socioeconomic History   Marital status: Married    Spouse name: Clearence    Number of children: 1   Years of education: 11   Highest education level: Not on file  Occupational History   Occupation: Unemployed   Scientist, product/process development strain: Not on file   Food insecurity    Worry: Not on file    Inability: Not on file   Transportation needs    Medical: Not on file    Non-medical: Not on file  Tobacco Use   Smoking status: Former Smoker   Smokeless tobacco: Never Used  Substance and Sexual Activity   Alcohol use: No   Drug use: No   Sexual activity: Not on file  Lifestyle   Physical activity    Days per week: Not on file    Minutes per session: Not on file   Stress: Not on file  Relationships   Social connections    Talks on phone: Not on file    Gets together: Not on file    Attends religious service: Not on file    Active member of club or organization: Not on file    Attends meetings of clubs or organizations:  Not on file    Relationship status: Not on file   Intimate partner violence    Fear of current or ex partner: Not on file    Emotionally abused: Not on file    Physically abused: Not on file    Forced sexual activity: Not on file  Other Topics Concern   Not on file  Social History Narrative   Patient lives at home with her husband [name].    Patient is currently not working.    Patient has 1 child.    Patient has a 11th grade education.       PHYSICAL EXAM  Vitals:   05/05/19 1042  BP: 120/82  Pulse: (!) 59  Temp: 97.8 F (36.6 C)  Weight: 222 lb 3.2 oz (100.8 kg)  Height: 5\' 5"  (1.651 m)   Body mass index is 36.98 kg/m.  Generalized: Well developed, in no acute distress   Neurological examination  Mentation: Alert oriented to time, place, history taking. Follows all commands speech and language fluent Cranial nerve II-XII: Pupils were equal round reactive to light. Extraocular movements were full, visual field were full on confrontational test. Facial sensation and strength were normal. Uvula tongue midline. Head turning and shoulder shrug  were normal, but pain provoking to neck pain Motor: The motor testing reveals 4 over 5 strength of all 4 extremities. Good symmetric motor tone is noted throughout.  Sensory: Sensory testing is intact to soft touch on all 4 extremities. No evidence of extinction is noted. Right sided neck is tender to touch, tension noted, limited ROM of her neck due to pain, mostly with right  sided rotation  Coordination: Cerebellar testing reveals good finger-nose-finger and heel-to-shin bilaterally.  Gait and station: Gait is slow, but steady, using cane Reflexes: Deep tendon reflexes are symmetric and normal bilaterally.   DIAGNOSTIC DATA (LABS, IMAGING, TESTING) - I reviewed patient records, labs, notes, testing and imaging myself where available.  Lab Results  Component Value Date   WBC 8.3 07/23/2018   HGB 11.9 (L) 07/23/2018   HCT  35.8 07/23/2018   MCV 81.5 07/23/2018   PLT 187 07/23/2018      Component Value Date/Time   NA 140 07/23/2018 1251   NA 141 09/05/2017 1532   NA 140 09/01/2014 1600   K 3.6 07/23/2018 1251   K 4.3 09/01/2014 1600   CL 106 07/23/2018 1251   CL 111 (H) 09/01/2014 1600   CO2 22 07/23/2018 1251   CO2 18 (L) 09/01/2014 1600   GLUCOSE 103 (H) 07/23/2018 1251   GLUCOSE 78 09/01/2014 1600   BUN 17 07/23/2018 1251   BUN 14 09/05/2017 1532   BUN 10 09/01/2014 1600   CREATININE 0.86 07/23/2018 1251   CREATININE 0.63 01/24/2015 1449   CALCIUM 8.7 (L) 07/23/2018 1251   CALCIUM 8.3 (L) 09/01/2014 1600   PROT 8.3 (H) 07/23/2018 1251   PROT 7.8 09/05/2017 1532   PROT 6.8 08/16/2014 0431   ALBUMIN 3.7 07/23/2018 1251   ALBUMIN 3.7 09/05/2017 1532   ALBUMIN 2.7 (L) 08/16/2014 0431   AST 19 07/23/2018 1251   AST 16 08/16/2014 0431   ALT 12 07/23/2018 1251   ALT 17 08/16/2014 0431   ALKPHOS 100 07/23/2018 1251   ALKPHOS 86 08/16/2014 0431   BILITOT 0.7 07/23/2018 1251   BILITOT <0.2 09/05/2017 1532   BILITOT 0.3 08/16/2014 0431   GFRNONAA >60 07/23/2018 1251   GFRNONAA >60 09/01/2014 1600   GFRNONAA >60 07/13/2013 1013   GFRNONAA 84 08/26/2012 1520   GFRAA >60 07/23/2018 1251   GFRAA >60 09/01/2014 1600   GFRAA >60 07/13/2013 1013   GFRAA >89 08/26/2012 1520   Lab Results  Component Value Date   CHOL 189 10/16/2018   HDL 59 10/16/2018   LDLCALC 101 (H) 10/16/2018   TRIG 146 10/16/2018   CHOLHDL 3.2 10/16/2018   Lab Results  Component Value Date   HGBA1C 5.6 12/28/2013   Lab Results  Component Value Date   VITAMINB12 483 09/05/2017   Lab Results  Component Value Date   TSH 2.520 02/11/2014      ASSESSMENT AND PLAN 68 y.o. year old female  has a past medical history of Asthma, Chronic low back pain, Collagen vascular disease ([place]), Conversion disorder with seizures or convulsions, COPD (chronic obstructive pulmonary disease) ([place]), Depression, Fibromyalgia, GERD  (gastroesophageal reflux disease), History of CVA (cerebrovascular accident), Hyperlipidemia, Hypertension, Migraine, Personal history of colonic polyp - adenoma (03/01/2014), Personality disorder ([place]), Rheumatoid arthritis ([place]), Seizures ([place]), Sleep apnea, and Stroke ([place]). here with:  1.  Neck pain, muscle strain  2.  Migraine headaches 3.  Seizures   She is complaining of neck pain for about 3 weeks.  The right side of her neck is tender to touch, movement of the neck, especially right-sided rotation is pain provoking.  She is not having any numbness or tingling down her arms or legs.  She has not had a recent fall.  She will start taking Robaxin 500 mg, daily as needed for muscle pain.  She will also start nortriptyline 10 mg at bedtime for 1 week, then increasing to 20  mg at bedtime.  This may be helpful for her neck pain as well as migraine headaches.  She remains on Topamax for migraines, but she does not feel this is beneficial.  We will not adjust medication at this time, wait to see if nortriptyline offers any benefit.  I have offered physical therapy for the neck pain, however she does not wish to proceed at this time.  She will follow-up in 3 months or sooner if needed.  I advised that if her symptoms worsen or she develops any new symptoms she should let us know.  I have encouraged her to alternate ice/heat for comfort, stretching/exercising as tolerated.  She also has history of seizures, while she is here I will check lab work that was ordered in May, including a phenobarbital level.  She has not had recurrent seizure.   I spent 25 minutes with the patient. 50% of this time was spent discussing her plan of care   Evangeline Dakin, DNP 05/05/2019, 11:34 AM [facility] [address] [place], [place] [address] [phone]

## 2019-05-05 ENCOUNTER — Other Ambulatory Visit: Payer: Self-pay

## 2019-05-05 ENCOUNTER — Ambulatory Visit (INDEPENDENT_AMBULATORY_CARE_PROVIDER_SITE_OTHER): Payer: Medicare Other | Admitting: Neurology

## 2019-05-05 ENCOUNTER — Encounter: Payer: Self-pay | Admitting: Neurology

## 2019-05-05 VITALS — BP 120/82 | HR 59 | Temp 97.8°F | Ht 65.0 in | Wt 222.2 lb

## 2019-05-05 DIAGNOSIS — G40309 Generalized idiopathic epilepsy and epileptic syndromes, not intractable, without status epilepticus: Secondary | ICD-10-CM | POA: Diagnosis not present

## 2019-05-05 DIAGNOSIS — G43009 Migraine without aura, not intractable, without status migrainosus: Secondary | ICD-10-CM

## 2019-05-05 MED ORDER — METHOCARBAMOL 500 MG PO TABS: 500.0000 mg | ORAL_TABLET | Freq: Three times a day (TID) | ORAL | 1 refills | Status: DC | PRN

## 2019-05-05 MED ORDER — NORTRIPTYLINE HCL 10 MG PO CAPS: ORAL_CAPSULE | ORAL | 3 refills | Status: DC

## 2019-05-05 MED ORDER — PHENOBARBITAL 100 MG PO TABS: 100.0000 mg | ORAL_TABLET | Freq: Every day | ORAL | 1 refills | Status: DC

## 2019-05-05 NOTE — Patient Instructions (Signed)
Start taking Robaxin for muscle tension in her neck, Nortriptyline starting 10 mg at bedtime. This will help with headache and neck pain. I will check lab work today for Phenobarbital. Please call if you will reconsider physical therapy.

## 2019-05-05 NOTE — Progress Notes (Signed)
I have read the note, and I agree with the clinical assessment and plan.  Darcia Lampi K Calvin Chura   

## 2019-05-06 ENCOUNTER — Telehealth: Payer: Self-pay | Admitting: *Deleted

## 2019-05-06 LAB — COMPREHENSIVE METABOLIC PANEL
ALT: 9 IU/L (ref 0–32)
AST: 16 IU/L (ref 0–40)
Albumin/Globulin Ratio: 1 — ABNORMAL LOW (ref 1.2–2.2)
Albumin: 3.7 g/dL — ABNORMAL LOW (ref 3.8–4.8)
Alkaline Phosphatase: 116 IU/L (ref 39–117)
BUN/Creatinine Ratio: 13 (ref 12–28)
BUN: 10 mg/dL (ref 8–27)
Bilirubin Total: 0.2 mg/dL (ref 0.0–1.2)
CO2: 20 mmol/L (ref 20–29)
Calcium: 9 mg/dL (ref 8.7–10.3)
Chloride: 102 mmol/L (ref 96–106)
Creatinine, Ser: 0.75 mg/dL (ref 0.57–1.00)
GFR calc Af Amer: 94 mL/min/{1.73_m2} (ref >59)
GFR calc non Af Amer: 82 mL/min/{1.73_m2} (ref >59)
Globulin, Total: 3.7 g/dL (ref 1.5–4.5)
Glucose: 79 mg/dL (ref 65–99)
Potassium: 4.7 mmol/L (ref 3.5–5.2)
Sodium: 138 mmol/L (ref 134–144)
Total Protein: 7.4 g/dL (ref 6.0–8.5)

## 2019-05-06 LAB — CBC WITH DIFFERENTIAL/PLATELET
Basophils Absolute: 0.1 10*3/uL (ref 0.0–0.2)
Basos: 1 %
EOS (ABSOLUTE): 0.2 10*3/uL (ref 0.0–0.4)
Eos: 3 %
Hematocrit: 33.7 % — ABNORMAL LOW (ref 34.0–46.6)
Hemoglobin: 10.6 g/dL — ABNORMAL LOW (ref 11.1–15.9)
Immature Grans (Abs): 0 10*3/uL (ref 0.0–0.1)
Immature Granulocytes: 0 %
Lymphocytes Absolute: 2.4 10*3/uL (ref 0.7–3.1)
Lymphs: 32 %
MCH: 25.3 pg — ABNORMAL LOW (ref 26.6–33.0)
MCHC: 31.5 g/dL (ref 31.5–35.7)
MCV: 80 fL (ref 79–97)
Monocytes Absolute: 0.5 10*3/uL (ref 0.1–0.9)
Monocytes: 7 %
Neutrophils Absolute: 4.4 10*3/uL (ref 1.4–7.0)
Neutrophils: 57 %
Platelets: 217 10*3/uL (ref 150–450)
RBC: 4.19 x10E6/uL (ref 3.77–5.28)
RDW: 14.4 % (ref 11.7–15.4)
WBC: 7.5 10*3/uL (ref 3.4–10.8)

## 2019-05-06 LAB — PHENOBARBITAL LEVEL: Phenobarbital, Serum: 25 ug/mL (ref 15–40)

## 2019-05-06 NOTE — Telephone Encounter (Signed)
Called pt was not able to LM as Mailbox full.

## 2019-05-06 NOTE — Telephone Encounter (Signed)
Pt called back and I relayed that lab work looks good, good level of phenobarbital. Mildly low hemoglobin 10.6, stable. Pt verbalized understanding.

## 2019-05-06 NOTE — Telephone Encounter (Signed)
-----   Message from Suzzanne Cloud, NP sent at 05/06/2019  7:49 AM EDT ----- Please call the patient. Lab work looks good, good level of phenobarbital. Mildly low hemoglobin 10.6, stable.

## 2019-05-25 ENCOUNTER — Ambulatory Visit: Payer: Medicare Other | Admitting: Neurology

## 2019-05-25 ENCOUNTER — Other Ambulatory Visit: Payer: Self-pay

## 2019-05-25 DIAGNOSIS — I10 Essential (primary) hypertension: Secondary | ICD-10-CM

## 2019-05-25 MED ORDER — CARVEDILOL 3.125 MG PO TABS: ORAL_TABLET | ORAL | 1 refills | Status: DC

## 2019-05-25 MED ORDER — OMEPRAZOLE 20 MG PO CPDR: 20.0000 mg | DELAYED_RELEASE_CAPSULE | Freq: Every day | ORAL | 1 refills | Status: DC

## 2019-05-25 NOTE — Telephone Encounter (Signed)
Would like to have patient schedule an appointment at her convenience to discuss continued use of prilosec given risks associated with prolong use.

## 2019-06-02 ENCOUNTER — Other Ambulatory Visit: Payer: Self-pay | Admitting: *Deleted

## 2019-06-02 DIAGNOSIS — F32A Depression, unspecified: Secondary | ICD-10-CM

## 2019-06-02 DIAGNOSIS — F329 Major depressive disorder, single episode, unspecified: Secondary | ICD-10-CM

## 2019-06-04 MED ORDER — CITALOPRAM HYDROBROMIDE 20 MG PO TABS: ORAL_TABLET | ORAL | 2 refills | Status: DC

## 2019-06-24 ENCOUNTER — Other Ambulatory Visit: Payer: Self-pay

## 2019-06-24 DIAGNOSIS — G8929 Other chronic pain: Secondary | ICD-10-CM

## 2019-06-24 MED ORDER — GABAPENTIN 300 MG PO CAPS: ORAL_CAPSULE | ORAL | 0 refills | Status: DC

## 2019-07-24 ENCOUNTER — Other Ambulatory Visit: Payer: Self-pay | Admitting: Family Medicine

## 2019-07-24 ENCOUNTER — Other Ambulatory Visit: Payer: Self-pay | Admitting: Neurology

## 2019-07-24 DIAGNOSIS — F329 Major depressive disorder, single episode, unspecified: Secondary | ICD-10-CM

## 2019-07-24 DIAGNOSIS — G8929 Other chronic pain: Secondary | ICD-10-CM

## 2019-07-24 DIAGNOSIS — F32A Depression, unspecified: Secondary | ICD-10-CM

## 2019-08-04 NOTE — Progress Notes (Deleted)
PATIENT: [name] DOB: May 05, 1951  REASON FOR VISIT: follow up HISTORY FROM: patient  HISTORY OF PRESENT ILLNESS: Today 08/04/19  HISTORY 05/05/2019 SS: [name] is a 68 year old female with history of seizures and headaches.  For headaches, she remains on Topamax.  For seizures, she is taking phenobarbital.  Today, she is complaining of a new problem.  She reports for the last few weeks she has had right-sided neck pain, causing her to develop a headache.  She reports she woke up one morning and her neck was stiff.  The pain is on the right side, radiates forward on right side.  She indicates this feels different from her migraines.  She is not having any numbness or tingling in her arms or legs.  She denies any changes in her bowels or bladder.  She has not fallen recently.  She is not having any new visual disturbances. The right side of her neck is tender to touch, worse with movement to the right side.  She denies any recent illness or fever.  She says she has not had recurrent seizure.  She does not feel that the Topamax is beneficial for her migraines.  She says she has about 2-3 migraines a month.  She presents today for follow-up accompanied by her daughter.  REVIEW OF SYSTEMS: Out of a complete 14 system review of symptoms, the patient complains only of the following symptoms, and all other reviewed systems are negative.  ALLERGIES: Allergies  Allergen Reactions  . Amoxicillin     REACTION: hives  . Aspirin     REACTION: rash  . Codeine Phosphate     REACTION: N/V  . Flexeril [Cyclobenzaprine] Nausea And Vomiting    Dizziness   . Ibuprofen Hives  . Penicillins     REACTION: hives  . Tetanus Toxoids Swelling    HOME MEDICATIONS: Outpatient Medications Prior to Visit  Medication Sig Dispense Refill  . Adalimumab 40 MG/0.4ML PNKT INJECT THE CONTENTS OF 1 PEN INTO THE SKIN EVERY 7 DAYS    . albuterol (PROVENTIL HFA;VENTOLIN HFA) 108 (90 Base) MCG/ACT inhaler  INHALE 2 PUFFS INTO THE LUNGS EVERY 6 HOURS AS NEEDED FOR WHEEZING ORSHORTNESS OF BREATH. 18 g 3  . carvedilol (COREG) 3.125 MG tablet Take one tablet PO twice daily with a meal 180 tablet 1  . citalopram (CELEXA) 20 MG tablet TAKE 1 TABLET BY MOUTH ONCE DAILY 30 tablet 2  . clotrimazole (LOTRIMIN) 1 % cream Apply 1 application topically 2 (two) times daily. Under breasts 30 g 1  . dicyclomine (BENTYL) 20 MG tablet Take 1 tablet (20 mg total) by mouth 3 (three) times daily as needed for spasms. 20 tablet 0  . ferrous sulfate 325 (65 FE) MG tablet Take 1 tablet (325 mg total) by mouth daily with breakfast. 30 tablet 2  . folic acid (FOLVITE) 1 MG tablet Take 1 mg by mouth.    . gabapentin (NEURONTIN) 300 MG capsule TAKE 3 CAPSULES BY MOUTH TWICE DAILY 180 capsule 0  . HYDROcodone-acetaminophen (NORCO/VICODIN) 5-325 MG tablet Take 1 tablet by mouth 3 (three) times daily as needed for moderate pain. 90 tablet 0  . methocarbamol (ROBAXIN) 500 MG tablet Take 1 tablet (500 mg total) by mouth every 8 (eight) hours as needed for muscle spasms. 60 tablet 1  . NEOMYCIN-POLYMYXIN-HYDROCORTISONE (CORTISPORIN) 1 % SOLN OTIC solution Place 3 drops into both ears 4 (four) times daily. 10 mL 0  . nortriptyline (PAMELOR) 10 MG capsule Start taking  1 capsule at bedtime x 1 week, then increase to 2 capsules at bedtime 60 capsule 3  . omeprazole (PRILOSEC) 20 MG capsule TAKE 1 CAPSULE BY MOUTH ONCE DAILY 30 capsule 1  . ondansetron (ZOFRAN ODT) 4 MG disintegrating tablet Take 1 tablet (4 mg total) by mouth every 8 (eight) hours as needed for nausea or vomiting. 20 tablet 0  . PHENObarbital (LUMINAL) 100 MG tablet Take 1 tablet (100 mg total) by mouth daily. 90 tablet 1  . tiotropium (SPIRIVA HANDIHALER) 18 MCG inhalation capsule INHALE THE CONTENTS OF 1 CAPSULE VIA HANDIHALER BY MOUTH ONCE DAILY 30 capsule 3  . topiramate (TOPAMAX) 50 MG tablet TAKE 1 TABLET BY MOUTH EVERY MORNING AND2 TABS BY MOUTH AT BEDTIME DAILY 90  tablet 11  . triamcinolone ointment (KENALOG) 0.5 % APPLY TO AFFECTED AREAS TWICE DAILY AS DIRECTED 60 g 0   No facility-administered medications prior to visit.     PAST MEDICAL HISTORY: Past Medical History:  Diagnosis Date  . Asthma   . Chronic low back pain   . Collagen vascular disease ([place])   . 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  . COPD (chronic obstructive pulmonary disease) ([place])   . Depression   . Fibromyalgia    08/18/15  [facility]  . GERD (gastroesophageal reflux disease)   . History of CVA (cerebrovascular accident)    "18 minni srokes"  . Hyperlipidemia   . Hypertension   . Migraine   . Personal history of colonic polyp - adenoma 03/01/2014  . Personality disorder ([place])   . Rheumatoid arthritis ([place])    08/18/15  [facility]  . Seizures ([place])    last seizure 4 years ago  . Sleep apnea    Mild. No need for CPAP.  Marland Kitchen Stroke [facility])     PAST SURGICAL HISTORY: Past Surgical History:  Procedure Laterality Date  . ANKLE SURGERY Left   . APPENDECTOMY    . BACK SURGERY    . BUNIONECTOMY     2 toes  . CARDIAC CATHETERIZATION  2008   Normal. Dr Acie Fredrickson.  . CHOLECYSTECTOMY    . CYST EXCISION     leg  . ESOPHAGOGASTRODUODENOSCOPY (EGD) WITH PROPOFOL N/A 06/27/2018   Procedure: ESOPHAGOGASTRODUODENOSCOPY (EGD) WITH PROPOFOL;  Surgeon: Jonathon Bellows, MD;  Location: [facility] ENDOSCOPY;  Service: Gastroenterology;  Laterality: N/A;  . FLANK MASS EXCISION    . FOOT TENDON SURGERY  june 2014   both feet  . KNEE SURGERY     Bilateral.  . NOSE SURGERY    . SHOULDER SURGERY     bil.  Marland Kitchen TOTAL ABDOMINAL HYSTERECTOMY      FAMILY HISTORY: Family History  Problem Relation Age of Onset  . Cirrhosis Mother   . Diabetes Mother   . Cirrhosis Father   . Breast cancer Sister 86  . Breast cancer Sister 19  . Colon cancer Neg Hx     SOCIAL HISTORY: Social History   Socioeconomic History  . Marital status: Married    Spouse name: Clearence   . Number  of children: 1  . Years of education: 11  . Highest education level: Not on file  Occupational History  . Occupation: Unemployed   Social Needs  . Financial resource strain: Not on file  . Food insecurity    Worry: Not on file    Inability: Not on file  . Transportation needs    Medical: Not on file    Non-medical: Not  on file  Tobacco Use  . Smoking status: Former Research scientist (life sciences)  . Smokeless tobacco: Never Used  Substance and Sexual Activity  . Alcohol use: No  . Drug use: No  . Sexual activity: Not on file  Lifestyle  . Physical activity    Days per week: Not on file    Minutes per session: Not on file  . Stress: Not on file  Relationships  . Social Herbalist on phone: Not on file    Gets together: Not on file    Attends religious service: Not on file    Active member of club or organization: Not on file    Attends meetings of clubs or organizations: Not on file    Relationship status: Not on file  . Intimate partner violence    Fear of current or ex partner: Not on file    Emotionally abused: Not on file    Physically abused: Not on file    Forced sexual activity: Not on file  Other Topics Concern  . Not on file  Social History Narrative   Patient lives at home with her husband [name].    Patient is currently not working.    Patient has 1 child.    Patient has a 11th grade education.       PHYSICAL EXAM  There were no vitals filed for this visit. There is no height or weight on file to calculate BMI.  Generalized: Well developed, in no acute distress   Neurological examination  Mentation: Alert oriented to time, place, history taking. Follows all commands speech and language fluent Cranial nerve II-XII: Pupils were equal round reactive to light. Extraocular movements were full, visual field were full on confrontational test. Facial sensation and strength were normal. Uvula tongue midline. Head turning and shoulder shrug  were normal and symmetric.  Motor: The motor testing reveals 5 over 5 strength of all 4 extremities. Good symmetric motor tone is noted throughout.  Sensory: Sensory testing is intact to soft touch on all 4 extremities. No evidence of extinction is noted.  Coordination: Cerebellar testing reveals good finger-nose-finger and heel-to-shin bilaterally.  Gait and station: Gait is normal. Tandem gait is normal. Romberg is negative. No drift is seen.  Reflexes: Deep tendon reflexes are symmetric and normal bilaterally.   DIAGNOSTIC DATA (LABS, IMAGING, TESTING) - I reviewed patient records, labs, notes, testing and imaging myself where available.  Lab Results  Component Value Date   WBC 7.5 05/05/2019   HGB 10.6 (L) 05/05/2019   HCT 33.7 (L) 05/05/2019   MCV 80 05/05/2019   PLT 217 05/05/2019      Component Value Date/Time   NA 138 05/05/2019 1142   NA 140 09/01/2014 1600   K 4.7 05/05/2019 1142   K 4.3 09/01/2014 1600   CL 102 05/05/2019 1142   CL 111 (H) 09/01/2014 1600   CO2 20 05/05/2019 1142   CO2 18 (L) 09/01/2014 1600   GLUCOSE 79 05/05/2019 1142   GLUCOSE 103 (H) 07/23/2018 1251   GLUCOSE 78 09/01/2014 1600   BUN 10 05/05/2019 1142   BUN 10 09/01/2014 1600   CREATININE 0.75 05/05/2019 1142   CREATININE 0.63 01/24/2015 1449   CALCIUM 9.0 05/05/2019 1142   CALCIUM 8.3 (L) 09/01/2014 1600   PROT 7.4 05/05/2019 1142   PROT 6.8 08/16/2014 0431   ALBUMIN 3.7 (L) 05/05/2019 1142   ALBUMIN 2.7 (L) 08/16/2014 0431   AST 16 05/05/2019 1142   AST 16  08/16/2014 0431   ALT 9 05/05/2019 1142   ALT 17 08/16/2014 0431   ALKPHOS 116 05/05/2019 1142   ALKPHOS 86 08/16/2014 0431   BILITOT 0.2 05/05/2019 1142   BILITOT 0.3 08/16/2014 0431   GFRNONAA 82 05/05/2019 1142   GFRNONAA >60 09/01/2014 1600   GFRNONAA >60 07/13/2013 1013   GFRNONAA 84 08/26/2012 1520   GFRAA 94 05/05/2019 1142   GFRAA >60 09/01/2014 1600   GFRAA >60 07/13/2013 1013   GFRAA >89 08/26/2012 1520   Lab Results  Component Value Date    CHOL 189 10/16/2018   HDL 59 10/16/2018   LDLCALC 101 (H) 10/16/2018   TRIG 146 10/16/2018   CHOLHDL 3.2 10/16/2018   Lab Results  Component Value Date   HGBA1C 5.6 12/28/2013   Lab Results  Component Value Date   VITAMINB12 483 09/05/2017   Lab Results  Component Value Date   TSH 2.520 02/11/2014      ASSESSMENT AND PLAN 68 y.o. year old female  has a past medical history of Asthma, Chronic low back pain, Collagen vascular disease ([place]), Conversion disorder with seizures or convulsions, COPD (chronic obstructive pulmonary disease) ([place]), Depression, Fibromyalgia, GERD (gastroesophageal reflux disease), History of CVA (cerebrovascular accident), Hyperlipidemia, Hypertension, Migraine, Personal history of colonic polyp - adenoma (03/01/2014), Personality disorder ([place]), Rheumatoid arthritis ([place]), Seizures ([place]), Sleep apnea, and Stroke ([place]). here with ***   I spent 15 minutes with the patient. 50% of this time was spent   Butler Denmark, Williamson, DNP 08/04/2019, 4:40 PM [facility] [address] [place], [place] [address] [phone]

## 2019-08-05 ENCOUNTER — Encounter: Payer: Self-pay | Admitting: Neurology

## 2019-08-05 ENCOUNTER — Ambulatory Visit: Payer: Medicare Other | Admitting: Neurology

## 2019-08-27 ENCOUNTER — Other Ambulatory Visit: Payer: Self-pay | Admitting: Family Medicine

## 2019-08-27 DIAGNOSIS — I10 Essential (primary) hypertension: Secondary | ICD-10-CM

## 2019-08-27 DIAGNOSIS — G8929 Other chronic pain: Secondary | ICD-10-CM

## 2019-09-08 ENCOUNTER — Emergency Department (HOSPITAL_COMMUNITY): Payer: Medicare Other

## 2019-09-08 ENCOUNTER — Other Ambulatory Visit: Payer: Self-pay

## 2019-09-08 ENCOUNTER — Ambulatory Visit: Payer: Medicare Other | Admitting: Adult Health

## 2019-09-08 ENCOUNTER — Observation Stay (HOSPITAL_COMMUNITY): Payer: Medicare Other

## 2019-09-08 ENCOUNTER — Encounter (HOSPITAL_COMMUNITY): Payer: Self-pay | Admitting: Radiology

## 2019-09-08 ENCOUNTER — Inpatient Hospital Stay (HOSPITAL_COMMUNITY)
Admission: EM | Admit: 2019-09-08 | Discharge: 2019-09-11 | DRG: 065 | Disposition: A | Discharge status: Home / Self Care | Payer: Medicare Other | Attending: Internal Medicine | Admitting: Internal Medicine

## 2019-09-08 DIAGNOSIS — Z8673 Personal history of transient ischemic attack (TIA), and cerebral infarction without residual deficits: Secondary | ICD-10-CM | POA: Diagnosis not present

## 2019-09-08 DIAGNOSIS — R4701 Aphasia: Secondary | ICD-10-CM | POA: Diagnosis present

## 2019-09-08 DIAGNOSIS — M25519 Pain in unspecified shoulder: Secondary | ICD-10-CM | POA: Diagnosis present

## 2019-09-08 DIAGNOSIS — E871 Hypo-osmolality and hyponatremia: Secondary | ICD-10-CM | POA: Diagnosis present

## 2019-09-08 DIAGNOSIS — R471 Dysarthria and anarthria: Secondary | ICD-10-CM | POA: Diagnosis not present

## 2019-09-08 DIAGNOSIS — Z87891 Personal history of nicotine dependence: Secondary | ICD-10-CM

## 2019-09-08 DIAGNOSIS — G40409 Other generalized epilepsy and epileptic syndromes, not intractable, without status epilepticus: Secondary | ICD-10-CM | POA: Diagnosis present

## 2019-09-08 DIAGNOSIS — F419 Anxiety disorder, unspecified: Secondary | ICD-10-CM | POA: Diagnosis present

## 2019-09-08 DIAGNOSIS — G43909 Migraine, unspecified, not intractable, without status migrainosus: Secondary | ICD-10-CM | POA: Diagnosis not present

## 2019-09-08 DIAGNOSIS — I10 Essential (primary) hypertension: Secondary | ICD-10-CM | POA: Diagnosis present

## 2019-09-08 DIAGNOSIS — G8929 Other chronic pain: Secondary | ICD-10-CM | POA: Diagnosis not present

## 2019-09-08 DIAGNOSIS — Z881 Allergy status to other antibiotic agents status: Secondary | ICD-10-CM

## 2019-09-08 DIAGNOSIS — E785 Hyperlipidemia, unspecified: Secondary | ICD-10-CM | POA: Diagnosis not present

## 2019-09-08 DIAGNOSIS — I6381 Other cerebral infarction due to occlusion or stenosis of small artery: Secondary | ICD-10-CM | POA: Diagnosis not present

## 2019-09-08 DIAGNOSIS — J449 Chronic obstructive pulmonary disease, unspecified: Secondary | ICD-10-CM | POA: Diagnosis not present

## 2019-09-08 DIAGNOSIS — Z886 Allergy status to analgesic agent status: Secondary | ICD-10-CM

## 2019-09-08 DIAGNOSIS — Z887 Allergy status to serum and vaccine status: Secondary | ICD-10-CM

## 2019-09-08 DIAGNOSIS — R29706 NIHSS score 6: Secondary | ICD-10-CM | POA: Diagnosis present

## 2019-09-08 DIAGNOSIS — K219 Gastro-esophageal reflux disease without esophagitis: Secondary | ICD-10-CM | POA: Diagnosis not present

## 2019-09-08 DIAGNOSIS — I639 Cerebral infarction, unspecified: Secondary | ICD-10-CM | POA: Diagnosis present

## 2019-09-08 DIAGNOSIS — Z885 Allergy status to narcotic agent status: Secondary | ICD-10-CM | POA: Diagnosis not present

## 2019-09-08 DIAGNOSIS — Z833 Family history of diabetes mellitus: Secondary | ICD-10-CM

## 2019-09-08 DIAGNOSIS — Z8719 Personal history of other diseases of the digestive system: Secondary | ICD-10-CM

## 2019-09-08 DIAGNOSIS — Z6835 Body mass index (BMI) 35.0-35.9, adult: Secondary | ICD-10-CM

## 2019-09-08 DIAGNOSIS — G4730 Sleep apnea, unspecified: Secondary | ICD-10-CM | POA: Diagnosis present

## 2019-09-08 DIAGNOSIS — G40309 Generalized idiopathic epilepsy and epileptic syndromes, not intractable, without status epilepticus: Secondary | ICD-10-CM | POA: Diagnosis not present

## 2019-09-08 DIAGNOSIS — Z20828 Contact with and (suspected) exposure to other viral communicable diseases: Secondary | ICD-10-CM | POA: Diagnosis present

## 2019-09-08 DIAGNOSIS — E669 Obesity, unspecified: Secondary | ICD-10-CM | POA: Diagnosis present

## 2019-09-08 DIAGNOSIS — F329 Major depressive disorder, single episode, unspecified: Secondary | ICD-10-CM | POA: Diagnosis present

## 2019-09-08 DIAGNOSIS — M797 Fibromyalgia: Secondary | ICD-10-CM | POA: Diagnosis present

## 2019-09-08 DIAGNOSIS — M069 Rheumatoid arthritis, unspecified: Secondary | ICD-10-CM | POA: Diagnosis not present

## 2019-09-08 DIAGNOSIS — I739 Peripheral vascular disease, unspecified: Secondary | ICD-10-CM | POA: Diagnosis present

## 2019-09-08 DIAGNOSIS — W19XXXA Unspecified fall, initial encounter: Secondary | ICD-10-CM | POA: Diagnosis not present

## 2019-09-08 DIAGNOSIS — I63311 Cerebral infarction due to thrombosis of right middle cerebral artery: Secondary | ICD-10-CM

## 2019-09-08 DIAGNOSIS — F418 Other specified anxiety disorders: Secondary | ICD-10-CM | POA: Diagnosis present

## 2019-09-08 DIAGNOSIS — F609 Personality disorder, unspecified: Secondary | ICD-10-CM | POA: Diagnosis present

## 2019-09-08 DIAGNOSIS — Z888 Allergy status to other drugs, medicaments and biological substances status: Secondary | ICD-10-CM | POA: Diagnosis not present

## 2019-09-08 DIAGNOSIS — Z803 Family history of malignant neoplasm of breast: Secondary | ICD-10-CM

## 2019-09-08 DIAGNOSIS — M545 Low back pain: Secondary | ICD-10-CM | POA: Diagnosis present

## 2019-09-08 LAB — CBC
HCT: 36.8 % (ref 36.0–46.0)
Hemoglobin: 11.3 g/dL — ABNORMAL LOW (ref 12.0–15.0)
MCH: 25.6 pg — ABNORMAL LOW (ref 26.0–34.0)
MCHC: 30.7 g/dL (ref 30.0–36.0)
MCV: 83.4 fL (ref 80.0–100.0)
Platelets: 212 10*3/uL (ref 150–400)
RBC: 4.41 MIL/uL (ref 3.87–5.11)
RDW: 16.4 % — ABNORMAL HIGH (ref 11.5–15.5)
WBC: 7.9 10*3/uL (ref 4.0–10.5)
nRBC: 0 % (ref 0.0–0.2)

## 2019-09-08 LAB — DIFFERENTIAL
Abs Immature Granulocytes: 0.04 10*3/uL (ref 0.00–0.07)
Basophils Absolute: 0 10*3/uL (ref 0.0–0.1)
Basophils Relative: 0 %
Eosinophils Absolute: 0.1 10*3/uL (ref 0.0–0.5)
Eosinophils Relative: 1 %
Immature Granulocytes: 1 %
Lymphocytes Relative: 30 %
Lymphs Abs: 2.4 10*3/uL (ref 0.7–4.0)
Monocytes Absolute: 0.5 10*3/uL (ref 0.1–1.0)
Monocytes Relative: 6 %
Neutro Abs: 4.9 10*3/uL (ref 1.7–7.7)
Neutrophils Relative %: 62 %

## 2019-09-08 LAB — CK: Total CK: 37 U/L — ABNORMAL LOW (ref 38–234)

## 2019-09-08 LAB — COMPREHENSIVE METABOLIC PANEL
ALT: UNDETERMINED U/L (ref 0–44)
AST: 19 U/L (ref 15–41)
Albumin: 3.4 g/dL — ABNORMAL LOW (ref 3.5–5.0)
Alkaline Phosphatase: 97 U/L (ref 38–126)
Anion gap: 13 (ref 5–15)
BUN: 6 mg/dL — ABNORMAL LOW (ref 8–23)
CO2: 17 mmol/L — ABNORMAL LOW (ref 22–32)
Calcium: 8.4 mg/dL — ABNORMAL LOW (ref 8.9–10.3)
Chloride: 104 mmol/L (ref 98–111)
Creatinine, Ser: 0.61 mg/dL (ref 0.44–1.00)
GFR calc Af Amer: >60 mL/min (ref >60)
GFR calc non Af Amer: >60 mL/min (ref >60)
Glucose, Bld: 96 mg/dL (ref 70–99)
Potassium: 4 mmol/L (ref 3.5–5.1)
Sodium: 134 mmol/L — ABNORMAL LOW (ref 135–145)
Total Bilirubin: UNDETERMINED mg/dL (ref 0.3–1.2)
Total Protein: 7.6 g/dL (ref 6.5–8.1)

## 2019-09-08 LAB — I-STAT CHEM 8, ED
BUN: 6 mg/dL — ABNORMAL LOW (ref 8–23)
Calcium, Ion: 0.9 mmol/L — ABNORMAL LOW (ref 1.15–1.40)
Chloride: 108 mmol/L (ref 98–111)
Creatinine, Ser: 0.5 mg/dL (ref 0.44–1.00)
Glucose, Bld: 99 mg/dL (ref 70–99)
HCT: 34 % — ABNORMAL LOW (ref 36.0–46.0)
Hemoglobin: 11.6 g/dL — ABNORMAL LOW (ref 12.0–15.0)
Potassium: 4 mmol/L (ref 3.5–5.1)
Sodium: 138 mmol/L (ref 135–145)
TCO2: 22 mmol/L (ref 22–32)

## 2019-09-08 LAB — APTT: aPTT: 24 seconds (ref 24–36)

## 2019-09-08 LAB — PROTIME-INR
INR: 1.1 (ref 0.8–1.2)
Prothrombin Time: 14.3 seconds (ref 11.4–15.2)

## 2019-09-08 LAB — PHENOBARBITAL LEVEL: Phenobarbital: 29.8 ug/mL (ref 15.0–30.0)

## 2019-09-08 MED ORDER — SODIUM CHLORIDE 0.9% FLUSH: 3.0000 mL | Freq: Once | INTRAVENOUS | Status: DC

## 2019-09-08 MED ORDER — ATORVASTATIN CALCIUM 80 MG PO TABS
80.0000 mg | ORAL_TABLET | Freq: Every day | ORAL | Status: DC
  Administered 2019-09-08 – 2019-09-11 (×4): 80 mg via ORAL
  Filled 2019-09-08 (×4): qty 1

## 2019-09-08 MED ORDER — ASPIRIN 325 MG PO TABS
325.0000 mg | ORAL_TABLET | Freq: Every day | ORAL | Status: DC
  Filled 2019-09-08: qty 1

## 2019-09-08 MED ORDER — ACETAMINOPHEN 325 MG PO TABS
650.0000 mg | ORAL_TABLET | Freq: Four times a day (QID) | ORAL | Status: DC | PRN
  Administered 2019-09-09: 650 mg via ORAL
  Filled 2019-09-08: qty 2

## 2019-09-08 MED ORDER — SODIUM CHLORIDE 0.9% FLUSH: 10.0000 mL | INTRAVENOUS | Status: DC | PRN

## 2019-09-08 MED ORDER — IOHEXOL 350 MG/ML SOLN
75.0000 mL | Freq: Once | INTRAVENOUS | Status: AC | PRN
  Administered 2019-09-08: 22:00:00 75 mL via INTRAVENOUS

## 2019-09-08 MED ORDER — DIPHENHYDRAMINE HCL 25 MG PO CAPS
25.0000 mg | ORAL_CAPSULE | Freq: Four times a day (QID) | ORAL | Status: DC | PRN
  Administered 2019-09-10: 25 mg via ORAL
  Filled 2019-09-08: qty 1

## 2019-09-08 MED ORDER — SODIUM CHLORIDE 0.9% FLUSH
10.0000 mL | Freq: Two times a day (BID) | INTRAVENOUS | Status: DC
  Administered 2019-09-08 – 2019-09-11 (×6): 10 mL

## 2019-09-08 MED ORDER — LORAZEPAM 1 MG PO TABS
1.0000 mg | ORAL_TABLET | Freq: Once | ORAL | Status: AC
  Administered 2019-09-08: 18:00:00 1 mg via ORAL
  Filled 2019-09-08: qty 1

## 2019-09-08 MED ORDER — LABETALOL HCL 5 MG/ML IV SOLN: 10.0000 mg | INTRAVENOUS | Status: DC | PRN

## 2019-09-08 NOTE — ED Triage Notes (Signed)
Pt here via EMS for numbness to L face and L hand and L leg, with blurred vision in R eye-all symptoms x 1 week. R hand numbness and aphasia x 3 days. Hx tia. Pt no on blood thinner. EMS called bc patient was found on floor by husband today when he went to take her to her PCP visit. Pt says fall was d/t weakness. Pt remembers fall, did not lose consciousness-states she hit the back of her head.

## 2019-09-08 NOTE — ED Notes (Signed)
Report rec'd from [place], [place]. Pt remains in MRI at this time.

## 2019-09-08 NOTE — H&P (Signed)
History and Physical    [name] [id number] DOB: January 15, 1951 DOA: 09/08/2019  PCP: Patriciaann Clan, DO   Patient coming from: Home   Chief Complaint: Weakness, numbness, speech difficulty   HPI: [name] is a 68 y.o. female with medical history significant for rheumatoid arthritis, COPD, seizure disorder, depression with anxiety, and history of TIA, now presenting to the emergency department for evaluation of focal numbness, speech difficulty, and blurred vision.  Patient developed left-sided numbness and weakness several days ago which has progressively worsened and resulted in a fall today.  Patient reported difficulty finding the right words to use and also felt as though her speech was slurred. She also reports some weakness to a lesser extent involving the right side. She denies chest pain or palpitations. Denies fevers or chills. Reports chronic cough secondary to chronic bronchitis.   ED Course: Upon arrival to the ED, patient is found to be afebrile, saturating well on room air, hypertensive to 150/110.  Chemistry panel features a slight hyponatremia and bicarbonate of 17.  CBC unremarkable.  EKG with sinus rhythm and T wave inversions.  Noncontrast head CT negative for acute findings.  MRI brain concerning for acute infarction involving the right corona radiata.  Neurology was consulted by the ED physician.  COVID-19 testing is in process.  Review of Systems:  All other systems reviewed and apart from HPI, are negative.  Past Medical History:  Diagnosis Date   Asthma    Chronic low back pain    Collagen vascular disease ([place])    Conversion disorder with seizures or convulsions    On Phenobarbital.   COPD (chronic obstructive pulmonary disease) (HCC)    Depression    Fibromyalgia    08/18/15  [facility]   GERD (gastroesophageal reflux disease)    History of CVA (cerebrovascular accident)    "41 minni srokes"   Hyperlipidemia    Hypertension    Migraine     Personal history of colonic polyp - adenoma 03/01/2014   Personality disorder ([place])    Rheumatoid arthritis ([place])    08/18/15  [facility]   Seizures ([place])    last seizure 4 years ago   Sleep apnea    Mild. No need for CPAP.   Stroke [facility])     Past Surgical History:  Procedure Laterality Date   ANKLE SURGERY Left    APPENDECTOMY     BACK SURGERY     BUNIONECTOMY     2 toes   CARDIAC CATHETERIZATION  2008   Normal. Dr Acie Fredrickson.   CHOLECYSTECTOMY     CYST EXCISION     leg   ESOPHAGOGASTRODUODENOSCOPY (EGD) WITH PROPOFOL N/A 06/27/2018   Procedure: ESOPHAGOGASTRODUODENOSCOPY (EGD) WITH PROPOFOL;  Surgeon: Jonathon Bellows, MD;  Location: [facility] ENDOSCOPY;  Service: Gastroenterology;  Laterality: N/A;   FLANK MASS EXCISION     FOOT TENDON SURGERY  june 2014   both feet   KNEE SURGERY     Bilateral.   NOSE SURGERY     SHOULDER SURGERY     bil.   TOTAL ABDOMINAL HYSTERECTOMY       reports that she has quit smoking. She has never used smokeless tobacco. She reports that she does not drink alcohol or use drugs.  Allergies  Allergen Reactions   Amoxicillin     REACTION: hives   Aspirin     REACTION: rash   Codeine Phosphate     REACTION: N/V   Flexeril [Cyclobenzaprine] Nausea And Vomiting  Dizziness    Ibuprofen Hives   Penicillins     REACTION: hives   Tetanus Toxoids Swelling    Family History  Problem Relation Age of Onset   Cirrhosis Mother    Diabetes Mother    Cirrhosis Father    Breast cancer Sister 68   Breast cancer Sister 78   Colon cancer Neg Hx      Prior to Admission medications   Medication Sig Start Date End Date Taking? Authorizing Provider  Adalimumab 40 MG/0.4ML PNKT INJECT THE CONTENTS OF 1 PEN INTO THE SKIN EVERY 7 DAYS 06/12/18   [provider]  albuterol (PROVENTIL HFA;VENTOLIN HFA) 108 (90 Base) MCG/ACT inhaler INHALE 2 PUFFS INTO THE LUNGS EVERY 6 HOURS AS NEEDED FOR WHEEZING ORSHORTNESS OF BREATH.  01/09/19   Lovenia Kim, MD  carvedilol (COREG) 3.125 MG tablet TAKE 1 TABLET BY MOUTH TWICE DAILY WITH A MEAL 08/27/19   Beard, Samantha N, DO  citalopram (CELEXA) 20 MG tablet TAKE 1 TABLET BY MOUTH ONCE DAILY 07/25/19   Patriciaann Clan, DO  clotrimazole (LOTRIMIN) 1 % cream Apply 1 application topically 2 (two) times daily. Under breasts 04/07/18   Rogue Bussing, MD  dicyclomine (BENTYL) 20 MG tablet Take 1 tablet (20 mg total) by mouth 3 (three) times daily as needed for spasms. 07/23/18   Earleen Newport, MD  ferrous sulfate 325 (65 FE) MG tablet Take 1 tablet (325 mg total) by mouth daily with breakfast. 08/16/17   Rogue Bussing, MD  folic acid (FOLVITE) 1 MG tablet Take 1 mg by mouth. 05/12/15   [provider]  gabapentin (NEURONTIN) 300 MG capsule TAKE 3 CAPSULES BY MOUTH TWICE DAILY 08/27/19   Patriciaann Clan, DO  HYDROcodone-acetaminophen (NORCO/VICODIN) 5-325 MG tablet Take 1 tablet by mouth 3 (three) times daily as needed for moderate pain. 09/05/15   Frazier Richards, MD  methocarbamol (ROBAXIN) 500 MG tablet Take 1 tablet (500 mg total) by mouth every 8 (eight) hours as needed for muscle spasms. 05/05/19   Suzzanne Cloud, NP  NEOMYCIN-POLYMYXIN-HYDROCORTISONE (CORTISPORIN) 1 % SOLN OTIC solution Place 3 drops into both ears 4 (four) times daily. 03/30/19   Diallo, Earna Coder, MD  nortriptyline (PAMELOR) 10 MG capsule Start taking 1 capsule at bedtime x 1 week, then increase to 2 capsules at bedtime 05/05/19   Suzzanne Cloud, NP  omeprazole (PRILOSEC) 20 MG capsule TAKE 1 CAPSULE BY MOUTH ONCE DAILY 08/27/19   Patriciaann Clan, DO  ondansetron (ZOFRAN ODT) 4 MG disintegrating tablet Take 1 tablet (4 mg total) by mouth every 8 (eight) hours as needed for nausea or vomiting. 06/10/18   Harvest Dark, MD  PHENObarbital (LUMINAL) 100 MG tablet Take 1 tablet (100 mg total) by mouth daily. 05/05/19   Suzzanne Cloud, NP  tiotropium (SPIRIVA HANDIHALER) 18 MCG  inhalation capsule INHALE THE CONTENTS OF 1 CAPSULE VIA HANDIHALER BY MOUTH ONCE DAILY 08/13/17   Rogue Bussing, MD  topiramate (TOPAMAX) 50 MG tablet TAKE 1 TABLET BY MOUTH EVERY MORNING AND2 TABS BY MOUTH AT BEDTIME DAILY 03/04/19   Ward Givens, NP  triamcinolone ointment (KENALOG) 0.5 % APPLY TO AFFECTED AREAS TWICE DAILY AS DIRECTED 01/13/19   Lovenia Kim, MD    Physical Exam: Vitals:   09/08/19 1355 09/08/19 1554 09/08/19 2025  BP: (!) 170/106 (!) 153/113 (!) 156/107  Pulse: 73 79 81  Resp: 16 17 17   Temp: 98.6 F (37 C)    TempSrc: Oral  SpO2: 99% 100% 100%    Constitutional: NAD, calm  Eyes: PERTLA, lids and conjunctivae normal ENMT: Mucous membranes are moist. Posterior pharynx clear of any exudate or lesions.   Neck: normal, supple, no masses, no thyromegaly Respiratory:  no wheezing, no crackles. Normal respiratory effort. No accessory muscle use.  Cardiovascular: S1 & S2 heard, regular rate and rhythm. No extremity pitting edema.   Abdomen: No distension, no tenderness, soft. Bowel sounds active.  Musculoskeletal: no clubbing / cyanosis. No joint deformity upper and lower extremities.    Skin: no significant rashes, lesions, ulcers. Warm, dry, well-perfused. Neurologic: left lower facial weakness. EOMI, PERRL. Sensation to light touch decreased on left. Strength 5/5 in all 4 limbs.  Psychiatric: Alert and oriented to person, place, and situation. Pleasant, cooperative.     Labs on Admission: I have personally reviewed following labs and imaging studies  CBC: Recent Labs  Lab 09/08/19 1430 09/08/19 1449  WBC 7.9  --   NEUTROABS 4.9  --   HGB 11.3* 11.6*  HCT 36.8 34.0*  MCV 83.4  --   PLT 212  --    Basic Metabolic Panel: Recent Labs  Lab 09/08/19 1440 09/08/19 1449  NA 134* 138  K 4.0 4.0  CL 104 108  CO2 17*  --   GLUCOSE 96 99  BUN 6* 6*  CREATININE 0.61 0.50  CALCIUM 8.4*  --    GFR: CrCl cannot be calculated (Unknown ideal  weight.). Liver Function Tests: Recent Labs  Lab 09/08/19 1440  AST 19  ALT QUANTITY NOT SUFFICIENT, UNABLE TO PERFORM TEST  ALKPHOS 97  BILITOT QUANTITY NOT SUFFICIENT, UNABLE TO PERFORM TEST  PROT 7.6  ALBUMIN 3.4*   No results for input(s): LIPASE, AMYLASE in the last 168 hours. No results for input(s): AMMONIA in the last 168 hours. Coagulation Profile: Recent Labs  Lab 09/08/19 1430  INR 1.1   Cardiac Enzymes: No results for input(s): CKTOTAL, CKMB, CKMBINDEX, TROPONINI in the last 168 hours. BNP (last 3 results) No results for input(s): PROBNP in the last 8760 hours. HbA1C: No results for input(s): HGBA1C in the last 72 hours. CBG: No results for input(s): GLUCAP in the last 168 hours. Lipid Profile: No results for input(s): CHOL, HDL, LDLCALC, TRIG, CHOLHDL, LDLDIRECT in the last 72 hours. Thyroid Function Tests: No results for input(s): TSH, T4TOTAL, FREET4, T3FREE, THYROIDAB in the last 72 hours. Anemia Panel: No results for input(s): VITAMINB12, FOLATE, FERRITIN, TIBC, IRON, RETICCTPCT in the last 72 hours. Urine analysis:    Component Value Date/Time   COLORURINE AMBER (A) 07/23/2018 1251   APPEARANCEUR HAZY (A) 07/23/2018 1251   LABSPEC 1.026 07/23/2018 1251   PHURINE 5.0 07/23/2018 1251   GLUCOSEU NEGATIVE 07/23/2018 1251   HGBUR NEGATIVE 07/23/2018 1251   BILIRUBINUR NEGATIVE 07/23/2018 1251   KETONESUR 20 (A) 07/23/2018 1251   PROTEINUR 30 (A) 07/23/2018 1251   NITRITE NEGATIVE 07/23/2018 1251   LEUKOCYTESUR NEGATIVE 07/23/2018 1251   Sepsis Labs: @LABRCNTIP (procalcitonin:4,lacticidven:4) )No results found for this or any previous visit (from the past 240 hour(s)).   Radiological Exams on Admission: Ct Head Wo Contrast  Result Date: 09/08/2019 CLINICAL DATA:  Speech difficulty EXAM: CT HEAD WITHOUT CONTRAST TECHNIQUE: Contiguous axial images were obtained from the base of the skull through the vertex without intravenous contrast. COMPARISON:   September 11, 2017 FINDINGS: Brain: No evidence of acute territorial infarction, hemorrhage, hydrocephalus,extra-axial collection or mass lesion/mass effect. There is dilatation the ventricles and sulci consistent with age-related atrophy.  Low-attenuation changes in the deep white matter consistent with small vessel ischemia. Again noted is a lacunar infarct within the right basal ganglia. Vascular: No hyperdense vessel or unexpected calcification. Skull: The skull is intact. No fracture or focal lesion identified. Sinuses/Orbits: The visualized paranasal sinuses and mastoid air cells are clear. The orbits and globes intact. Other: None IMPRESSION: No acute intracranial abnormality. Findings consistent with age related atrophy and chronic small vessel ischemia Lacunar infarct in the right basal ganglia. Electronically Signed   By: Prudencio Pair M.D.   On: 09/08/2019 15:36   Mr Brain Wo Contrast  Result Date: 09/08/2019 CLINICAL DATA:  Focal neuro deficit, greater than 6 hours, stroke suspected. Additional history provided: Patient arrives via EMS with numbness of left face, left hand and left leg, blurred vision in right eye. EXAM: MRI HEAD WITHOUT CONTRAST TECHNIQUE: Multiplanar, multiecho pulse sequences of the brain and surrounding structures were obtained without intravenous contrast. COMPARISON:  Head CT 09/08/2019. FINDINGS: Brain: The diffusion-weighted sequences are of good quality. However, there is intermittent moderate motion degradation of multiple sequences, limiting evaluation. There is an acute lacunar infarct measuring 1.1 x 0.8 x 1.8 cm (AP x TV x CC) within the right corona radiata, extending inferiorly within the right internal capsule/basal ganglia. Corresponding T2/FLAIR hyperintensity at this site. There is a background of moderate chronic small vessel ischemic disease. Prominent perivascular space versus chronic lacunar infarct within the inferior right basal ganglia. No evidence of  intracranial mass. No midline shift or extra-axial fluid collection. No chronic intracranial blood products are identified. Cerebral volume is normal for age. Vascular: Flow voids maintained within the proximal large arterial vessels. Skull and upper cervical spine: No focal marrow lesion identified on motion degraded imaging. Sinuses/Orbits: Visualized orbits demonstrate no acute abnormality. Mild ethmoid sinus mucosal thickening. No significant mastoid effusion. These results were called by telephone at the time of interpretation on 09/08/2019 at 7:35 pm to provider Dr. Joselyn Glassman, who verbally acknowledged these results. IMPRESSION: 1. Intermittently motion degraded examination as described. 2. 1.1 x 0.8 x 1.8 cm (AP x TV x CC) acute infarct within the right corona radiata, extending inferiorly within the right internal capsule/basal ganglia. 3. There is a background of moderate chronic small vessel ischemic disease. Electronically Signed   By: Kellie Simmering DO   On: 09/08/2019 19:35   Dg Knee Complete 4 Views Right  Result Date: 09/08/2019 CLINICAL DATA:  Fall, hip and knee pain EXAM: RIGHT KNEE - COMPLETE 4+ VIEW COMPARISON:  None. FINDINGS: Advanced degenerative changes within all 3 compartments of the right knee with joint space narrowing and spurring. Small joint effusion. No acute bony abnormality. Specifically, no fracture, subluxation, or dislocation. IMPRESSION: Advanced tricompartment degenerative changes. Small joint effusion. No acute bony abnormality. Electronically Signed   By: Rolm Baptise M.D.   On: 09/08/2019 19:55   Dg Femur Min 2 Views Right  Result Date: 09/08/2019 CLINICAL DATA:  Fall, hip pain EXAM: RIGHT FEMUR 2 VIEWS COMPARISON:  None. FINDINGS: Advanced arthritis in the right knee. Joint space narrowing and spurring. Small joint effusion. No acute bony abnormality. Specifically, no fracture, subluxation, or dislocation. IMPRESSION: No acute bony abnormality. Advanced degenerative  changes in the right knee with small joint effusion. Electronically Signed   By: Rolm Baptise M.D.   On: 09/08/2019 19:55    EKG: Independently reviewed. Sinus rhythm, T-wave inversions.    Assessment/Plan   1. Acute ischemic CVA  - Presents with several days of left-sided numbness, weakness, and  speech difficulty, found to have acute infarction involving right corona radiata on MRI in ED  - Neurology is consulting and much appreciated  - Continue cardiac monitoring, frequent neuro checks, NPO pending swallow screen, PT/OT/SLP evals  - Check CTA head and neck, A1c, and lipid panel  - Start high-intensity statin and ASA    2. Seizure disorder  - Continue antiepileptics pending pharmacy med-rec    3. Chronic bronchitis  - Stable, continue Spiriva and as-needed albuterol    4. Rheumatoid arthritis  - Patient reports chronic shoulder pain, managed with adalimumab  - She will continue follow-up with Rheumatology     PPE: Mask, face shield  DVT prophylaxis: Lovenox  Code Status: Full  Family Communication: Discussed with patient  Consults called: Neuro  Admission status: Observation     Vianne Bulls, MD [facility] Pager [phone]  If 7PM-7AM, please contact night-coverage [email or web address] Password [facility]  09/08/2019, 8:35 PM

## 2019-09-08 NOTE — ED Notes (Signed)
[name] (Husband#[phone]) called/would like an update/waiting in car outside ER.

## 2019-09-08 NOTE — Consult Note (Signed)
Neurology Consultation  Reason for Consult: Stroke Referring Physician: Dr. Myna Hidalgo  CC: Left face arm and leg numbness  History is obtained from: Chart review, patient  HPI: [name] is a 68 y.o. 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 Patient reports that she has had numbness and also difficulty talking for the past 3 to 4 days-the numbness involves the left upper and lower extremity and has gotten worse making her unable to walk and have a fall which made the family bring her to the emergency room. Patient also expressed some word finding difficulty along with slurred speech. Denies any chest pain shortness of breath nausea vomiting.  No double vision.  No visual field loss.  LKW: At least 3 days ago tpa given?: no, outside the window Premorbid modified Rankin scale (mRS): 1   ROS: ROS was performed and is negative except as noted in the HPI.    Past Medical History:  Diagnosis Date  . Asthma   . Chronic low back pain   . Collagen vascular disease ([place])   . 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  . COPD (chronic obstructive pulmonary disease) ([place])   . Depression   . Fibromyalgia    08/18/15  [facility]  . GERD (gastroesophageal reflux disease)   . History of CVA (cerebrovascular accident)    "50 minni srokes"  . Hyperlipidemia   . Hypertension   . Migraine   . Personal history of colonic polyp - adenoma 03/01/2014  .  Personality disorder ([place])   . Rheumatoid arthritis ([place])    08/18/15  [facility]  . Seizures ([place])    last seizure 4 years ago  . Sleep apnea    Mild. No need for CPAP.  Marland Kitchen Stroke [facility])     Family History  Problem Relation Age of Onset  . Cirrhosis Mother   . Diabetes Mother   . Cirrhosis Father   . Breast cancer Sister 62  . Breast cancer Sister 31  . Colon cancer Neg Hx     Social History:   reports that she has quit smoking. She has never used smokeless tobacco. She reports that she does not drink alcohol or use drugs.  Medications  Current Facility-Administered Medications:  .  sodium chloride flush (NS) 0.9 % injection 3 mL, 3 mL, Intravenous, Once, Dykstra, Ellwood Dense, MD  Current Outpatient Medications:  .  Adalimumab 40 MG/0.4ML PNKT, INJECT THE CONTENTS OF 1 PEN INTO THE SKIN EVERY 7 DAYS, Disp: , Rfl:  .  albuterol (PROVENTIL HFA;VENTOLIN HFA) 108 (90 Base) MCG/ACT inhaler, INHALE 2 PUFFS INTO THE LUNGS EVERY 6 HOURS AS NEEDED FOR WHEEZING ORSHORTNESS OF BREATH., Disp: 18 g, Rfl: 3 .  carvedilol (COREG) 3.125 MG tablet, TAKE 1 TABLET BY MOUTH TWICE DAILY WITH A MEAL, Disp: 180 tablet, Rfl: 1 .  citalopram (CELEXA) 20 MG tablet, TAKE 1 TABLET BY MOUTH ONCE DAILY, Disp: 30 tablet, Rfl: 2 .  clotrimazole (LOTRIMIN) 1 % cream, Apply 1 application topically 2 (two) times daily. Under breasts,  Disp: 30 g, Rfl: 1 .  dicyclomine (BENTYL) 20 MG tablet, Take 1 tablet (20 mg total) by mouth 3 (three) times daily as needed for spasms., Disp: 20 tablet, Rfl: 0 .  ferrous sulfate 325 (65 FE) MG tablet, Take 1 tablet (325 mg total) by mouth daily with breakfast., Disp: 30 tablet, Rfl: 2 .  folic acid (FOLVITE) 1 MG tablet, Take 1 mg by mouth., Disp: , Rfl:  .  gabapentin (NEURONTIN) 300 MG capsule, TAKE 3 CAPSULES BY MOUTH TWICE DAILY, Disp: 180 capsule, Rfl: 0 .  HYDROcodone-acetaminophen (NORCO/VICODIN) 5-325 MG tablet, Take 1 tablet by mouth 3 (three) times daily as needed for moderate  pain., Disp: 90 tablet, Rfl: 0 .  methocarbamol (ROBAXIN) 500 MG tablet, Take 1 tablet (500 mg total) by mouth every 8 (eight) hours as needed for muscle spasms., Disp: 60 tablet, Rfl: 1 .  NEOMYCIN-POLYMYXIN-HYDROCORTISONE (CORTISPORIN) 1 % SOLN OTIC solution, Place 3 drops into both ears 4 (four) times daily., Disp: 10 mL, Rfl: 0 .  nortriptyline (PAMELOR) 10 MG capsule, Start taking 1 capsule at bedtime x 1 week, then increase to 2 capsules at bedtime, Disp: 60 capsule, Rfl: 3 .  omeprazole (PRILOSEC) 20 MG capsule, TAKE 1 CAPSULE BY MOUTH ONCE DAILY, Disp: 30 capsule, Rfl: 1 .  ondansetron (ZOFRAN ODT) 4 MG disintegrating tablet, Take 1 tablet (4 mg total) by mouth every 8 (eight) hours as needed for nausea or vomiting., Disp: 20 tablet, Rfl: 0 .  PHENObarbital (LUMINAL) 100 MG tablet, Take 1 tablet (100 mg total) by mouth daily., Disp: 90 tablet, Rfl: 1 .  tiotropium (SPIRIVA HANDIHALER) 18 MCG inhalation capsule, INHALE THE CONTENTS OF 1 CAPSULE VIA HANDIHALER BY MOUTH ONCE DAILY, Disp: 30 capsule, Rfl: 3 .  topiramate (TOPAMAX) 50 MG tablet, TAKE 1 TABLET BY MOUTH EVERY MORNING AND2 TABS BY MOUTH AT BEDTIME DAILY, Disp: 90 tablet, Rfl: 11 .  triamcinolone ointment (KENALOG) 0.5 %, APPLY TO AFFECTED AREAS TWICE DAILY AS DIRECTED, Disp: 60 g, Rfl: 0  Exam: Current vital signs: BP (!) 156/107   Pulse 81   Temp 98.6 F (37 C) (Oral)   Resp 17   SpO2 100%  Vital signs in last 24 hours: Temp:  [98.6 F (37 C)] 98.6 F (37 C) (11/17 1355) Pulse Rate:  [73-81] 81 (11/17 2025) Resp:  [16-17] 17 (11/17 2025) BP: (153-170)/(106-113) 156/107 (11/17 2025) SpO2:  [99 %-100 %] 100 % (11/17 2025) General: Awake alert, appears a little worried in distress but was not able to say why. HEENT: Normocephalic atraumatic Lungs: Clear to auscultation Cardiovascular: Regular rate rhythm Abdomen: Soft nondistended nontender Extremities warm well perfused with trace pedal edema bilaterally Neurological  exam Awake alert oriented x2. Speech is moderately dysarthric. She has a stutter in her speech. Poor attention concentration.  Unable to follow complex commands but follows simple commands consistently. Is able to provide most of the history herself but with poor recollection of timing of events. Cranial: Pupils equal round react light, extraocular movements intact, visual fields are full, mild left lower facial weakness, decreased sensation on the left face, midline tongue and palate. Motor exam: Left upper extremity with mild drift, right upper extremity with no drift.  Both lower extremities with drift. Sensory exam: Decreased sensation on the left hemibody. Coordination: No gross coordination deficits-is mildly dystaxic on the left proportionate to the weakness. Gait testing was deferred at this time NIH stroke scale 1a Level of Conscious.: 0 1b LOC Questions: 1 1c LOC  Commands: 0 2 Best Gaze: 0 3 Visual: 0 4 Facial Palsy: 1 5a Motor Arm - left: 1 5b Motor Arm - Right: 0 6a Motor Leg - Left: 1 6b Motor Leg - Right: 1 7 Limb Ataxia: 0 8 Sensory: 2 9 Best Language: 1 10 Dysarthria: 2 11 Extinct. and Inatten.: 0 TOTAL: 10   Labs I have reviewed labs in epic and the results pertinent to this consultation are:  CBC    Component Value Date/Time   WBC 7.9 09/08/2019 1430   RBC 4.41 09/08/2019 1430   HGB 11.6 (L) 09/08/2019 1449   HGB 10.6 (L) 05/05/2019 1142   HCT 34.0 (L) 09/08/2019 1449   HCT 33.7 (L) 05/05/2019 1142   PLT 212 09/08/2019 1430   PLT 217 05/05/2019 1142   MCV 83.4 09/08/2019 1430   MCV 80 05/05/2019 1142   MCV 86 09/01/2014 1600   MCH 25.6 (L) 09/08/2019 1430   MCHC 30.7 09/08/2019 1430   RDW 16.4 (H) 09/08/2019 1430   RDW 14.4 05/05/2019 1142   RDW 16.7 (H) 09/01/2014 1600   LYMPHSABS 2.4 09/08/2019 1430   LYMPHSABS 2.4 05/05/2019 1142   LYMPHSABS 2.7 09/01/2014 1600   MONOABS 0.5 09/08/2019 1430   MONOABS 0.5 09/01/2014 1600   EOSABS 0.1  09/08/2019 1430   EOSABS 0.2 05/05/2019 1142   EOSABS 0.1 09/01/2014 1600   BASOSABS 0.0 09/08/2019 1430   BASOSABS 0.1 05/05/2019 1142   BASOSABS 0.1 09/01/2014 1600    CMP     Component Value Date/Time   NA 138 09/08/2019 1449   NA 138 05/05/2019 1142   NA 140 09/01/2014 1600   K 4.0 09/08/2019 1449   K 4.3 09/01/2014 1600   CL 108 09/08/2019 1449   CL 111 (H) 09/01/2014 1600   CO2 17 (L) 09/08/2019 1440   CO2 18 (L) 09/01/2014 1600   GLUCOSE 99 09/08/2019 1449   GLUCOSE 78 09/01/2014 1600   BUN 6 (L) 09/08/2019 1449   BUN 10 05/05/2019 1142   BUN 10 09/01/2014 1600   CREATININE 0.50 09/08/2019 1449   CREATININE 0.63 01/24/2015 1449   CALCIUM 8.4 (L) 09/08/2019 1440   CALCIUM 8.3 (L) 09/01/2014 1600   PROT 7.6 09/08/2019 1440   PROT 7.4 05/05/2019 1142   PROT 6.8 08/16/2014 0431   ALBUMIN 3.4 (L) 09/08/2019 1440   ALBUMIN 3.7 (L) 05/05/2019 1142   ALBUMIN 2.7 (L) 08/16/2014 0431   AST 19 09/08/2019 1440   AST 16 08/16/2014 0431   ALT QUANTITY NOT SUFFICIENT, UNABLE TO PERFORM TEST 09/08/2019 1440   ALT 17 08/16/2014 0431   ALKPHOS 97 09/08/2019 1440   ALKPHOS 86 08/16/2014 0431   BILITOT QUANTITY NOT SUFFICIENT, UNABLE TO PERFORM TEST 09/08/2019 1440   BILITOT 0.2 05/05/2019 1142   BILITOT 0.3 08/16/2014 0431   GFRNONAA >60 09/08/2019 1440   GFRNONAA >60 09/01/2014 1600   GFRNONAA >60 07/13/2013 1013   GFRNONAA 84 08/26/2012 1520   GFRAA >60 09/08/2019 1440   GFRAA >60 09/01/2014 1600   GFRAA >60 07/13/2013 1013   GFRAA >89 08/26/2012 1520    Imaging I have reviewed the images obtained:  CT-scan of the brain-no acute abnormality.  Age-related atrophy and chronic small vessel disease.  Lacunar right basal ganglia infarct.  MRI examination of the brain-acute lacunar infarction in the right corona radiata extending into the right internal capsule/basal ganglia.  Assessment: 68 year old woman with extensive past medical history of conversion disorder with  seizures, on phenobarbital, fibromyalgia,  depression, history of multiple TIAs in the past, migraines, rheumatoid arthritis, personality disorder, sleep apnea, on nortriptyline Topamax and Robaxin for her headaches, presented to the emergency room for at least 3-day with left-sided numbness and weakness. Also complained of difficulty talking-word finding difficulty as well as slurred speech. Exam is slightly inconsistent with stuttering of speech. MRI with right corona radiata/internal capsule/basal ganglia acute infarction-based on location etiology most likely small vessel disease  Impression: Acute infarction-lacunar infarct in the right corona radiata/internal capsule/basal ganglia-likely small vessel disease. At times somewhat inconsistent exam with stuttering speech-question psychiatric overlay on top of the renal disease. History of seizure disorder History of migraines  Recommendations: Admit to hospitalist CTA head and neck Telemetry Frequent neurochecks 2D echo A1c Lipid panel Aspirin 325 Lipitor 80 Continue home phenobarbital, Topamax, nortriptyline Hold Robaxin for now-can cause altered mental status as well as lower seizure threshold. PT OT Speech therapy Stroke swallow screen-n.p.o. until passes the swallow screen No need for permissive hypertension as she is already past the time window for permissive hypertension based on the history. Stroke team will follow with you -- Amie [place], MD [facility] Pager: [phone] If 7pm to 7am, please call on call as listed on [facility].

## 2019-09-08 NOTE — ED Provider Notes (Signed)
[facility] EMERGENCY DEPARTMENT Provider Note   CSN: [id number] Arrival date & time: 09/08/19  1346     History   Chief Complaint Chief Complaint  Patient presents with  . Numbness  . Aphasia    HPI [name] is a 68 y.o. female past medical [place] TIA, hyperlipidemia, hypertension, migraines, right fourth brightest, seizures (currently on phenobarbital) who presents for evaluation of 3 to 4 days of aphasia, 3 days of left upper, left lower extremity numbness and a fall that occurred today.  Husband provides additional history.  Patient states that over the last 3 days, she has had difficulty speaking.  Husband states that this is abnormal.  He had noticed that patient seems slightly weaker on her left side.  He states that today, he left this morning to go to work and came back around 10-11AM.  He states that when he came home, he found her on the floor.  She states that she had fallen.  No chest pain.  Was having difficulty ambulating bearing weight on right lower extremity.  Daughter does states she has a history of seizures and is currently on phenobarbital which she has been compliant with.  She has not had seizures in several years.  History limited secondary to difficulty speaking. EM LEVEL 5 CAVEAT DUE TO ACUITY OF CONDITION.      The history is provided by the patient and the spouse.    Past Medical History:  Diagnosis Date  . Asthma   . Chronic low back pain   . Collagen vascular disease ([place])   . 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  . COPD (chronic obstructive pulmonary disease) ([place])   . Depression   . Fibromyalgia    08/18/15  [facility]  . GERD (gastroesophageal reflux disease)   . History of CVA (cerebrovascular accident)    "75 minni srokes"  . Hyperlipidemia   . Hypertension   . Migraine   . Personal history of colonic polyp - adenoma 03/01/2014  . Personality disorder ([place])   . Rheumatoid arthritis  ([place])    08/18/15  [facility]  . Seizures ([place])    last seizure 4 years ago  . Sleep apnea    Mild. No need for CPAP.  Marland Kitchen Stroke [facility])     Patient Active Problem List   Diagnosis Date Noted  . Acute ischemic stroke ([place]) 09/08/2019  . Ear pain, bilateral 03/30/2019  . Coronary artery disease 10/16/2018  . Nasal congestion 08/15/2017  . Hypersomnia with sleep apnea 04/18/2017  . Snoring 04/18/2017  . Post traumatic stress disorder (PTSD) 04/18/2017  . COPD mixed type ([place]) 04/18/2017  . Somnolence, daytime 02/26/2017  . Cervical paraspinal muscle spasm 10/26/2016  . Lower back pain 05/27/2016  . Left hip pain 05/27/2016  . Fibromyalgia 08/18/2015  . Rheumatoid arthritis ([place]) 03/09/2015  . Essential hypertension, benign 02/18/2015  . Right knee pain 02/18/2015  . Cervical disc disorder with radiculopathy of cervical region 01/21/2015  . Bilateral arm pain 01/13/2015  . After cataract 07/22/2014  . Personal history of colonic polyp - adenoma 03/01/2014  . Candidal intertrigo 12/29/2013  . Dyshidrotic eczema 12/29/2013  . Routine adult health maintenance 12/29/2013  . 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 Frequent falls 02/10/2013  . Generalized pain 08/30/2012  . Neck pain 02/21/2012  . STROKE 07/29/2008  . Seizures ([place]) 07/29/2008  . Migraine without aura 07/29/2007  . DEPENDENCE, BARB/SED, CONTINUOUS 06/24/2007  . HYPERLIPIDEMIA 12/19/2006  .  OBESITY, NOS 12/19/2006  . Depression with anxiety 12/19/2006  . PANIC ATTACKS 12/19/2006  . 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 BORDERLINE PERSONALITY 12/19/2006  . COPD 12/19/2006  . REFLUX ESOPHAGITIS 12/19/2006    Past Surgical History:  Procedure Laterality Date  . ANKLE SURGERY Left   . APPENDECTOMY    . BACK SURGERY    . BUNIONECTOMY     2 toes  . CARDIAC CATHETERIZATION  2008   Normal. Dr Acie Fredrickson.  . CHOLECYSTECTOMY    . CYST EXCISION     leg  . ESOPHAGOGASTRODUODENOSCOPY (EGD) WITH PROPOFOL N/A  06/27/2018   Procedure: ESOPHAGOGASTRODUODENOSCOPY (EGD) WITH PROPOFOL;  Surgeon: Jonathon Bellows, MD;  Location: [facility] ENDOSCOPY;  Service: Gastroenterology;  Laterality: N/A;  . FLANK MASS EXCISION    . FOOT TENDON SURGERY  june 2014   both feet  . KNEE SURGERY     Bilateral.  . NOSE SURGERY    . SHOULDER SURGERY     bil.  Marland Kitchen TOTAL ABDOMINAL HYSTERECTOMY       OB History   No obstetric history on file.      Home Medications    Prior to Admission medications   Medication Sig Start Date End Date Taking? Authorizing Provider  Adalimumab 40 MG/0.4ML PNKT INJECT THE CONTENTS OF 1 PEN INTO THE SKIN EVERY 7 DAYS 06/12/18   [provider]  albuterol (PROVENTIL HFA;VENTOLIN HFA) 108 (90 Base) MCG/ACT inhaler INHALE 2 PUFFS INTO THE LUNGS EVERY 6 HOURS AS NEEDED FOR WHEEZING ORSHORTNESS OF BREATH. 01/09/19   Lovenia Kim, MD  carvedilol (COREG) 3.125 MG tablet TAKE 1 TABLET BY MOUTH TWICE DAILY WITH A MEAL 08/27/19   Beard, Samantha N, DO  citalopram (CELEXA) 20 MG tablet TAKE 1 TABLET BY MOUTH ONCE DAILY 07/25/19   Patriciaann Clan, DO  clotrimazole (LOTRIMIN) 1 % cream Apply 1 application topically 2 (two) times daily. Under breasts 04/07/18   Rogue Bussing, MD  dicyclomine (BENTYL) 20 MG tablet Take 1 tablet (20 mg total) by mouth 3 (three) times daily as needed for spasms. 07/23/18   Earleen Newport, MD  ferrous sulfate 325 (65 FE) MG tablet Take 1 tablet (325 mg total) by mouth daily with breakfast. 08/16/17   Rogue Bussing, MD  folic acid (FOLVITE) 1 MG tablet Take 1 mg by mouth. 05/12/15   [provider]  gabapentin (NEURONTIN) 300 MG capsule TAKE 3 CAPSULES BY MOUTH TWICE DAILY 08/27/19   Patriciaann Clan, DO  HYDROcodone-acetaminophen (NORCO/VICODIN) 5-325 MG tablet Take 1 tablet by mouth 3 (three) times daily as needed for moderate pain. 09/05/15   Frazier Richards, MD  methocarbamol (ROBAXIN) 500 MG tablet Take 1 tablet (500 mg total) by mouth every  8 (eight) hours as needed for muscle spasms. 05/05/19   Suzzanne Cloud, NP  NEOMYCIN-POLYMYXIN-HYDROCORTISONE (CORTISPORIN) 1 % SOLN OTIC solution Place 3 drops into both ears 4 (four) times daily. 03/30/19   Diallo, Earna Coder, MD  nortriptyline (PAMELOR) 10 MG capsule Start taking 1 capsule at bedtime x 1 week, then increase to 2 capsules at bedtime 05/05/19   Suzzanne Cloud, NP  omeprazole (PRILOSEC) 20 MG capsule TAKE 1 CAPSULE BY MOUTH ONCE DAILY 08/27/19   Patriciaann Clan, DO  ondansetron (ZOFRAN ODT) 4 MG disintegrating tablet Take 1 tablet (4 mg total) by mouth every 8 (eight) hours as needed for nausea or vomiting. 06/10/18   Harvest Dark, MD  PHENObarbital (LUMINAL) 100 MG tablet Take 1 tablet (100  mg total) by mouth daily. 05/05/19   Suzzanne Cloud, NP  tiotropium (SPIRIVA HANDIHALER) 18 MCG inhalation capsule INHALE THE CONTENTS OF 1 CAPSULE VIA HANDIHALER BY MOUTH ONCE DAILY 08/13/17   Rogue Bussing, MD  topiramate (TOPAMAX) 50 MG tablet TAKE 1 TABLET BY MOUTH EVERY MORNING AND2 TABS BY MOUTH AT BEDTIME DAILY 03/04/19   Ward Givens, NP  triamcinolone ointment (KENALOG) 0.5 % APPLY TO AFFECTED AREAS TWICE DAILY AS DIRECTED 01/13/19   Lovenia Kim, MD    Family History Family History  Problem Relation Age of Onset  . Cirrhosis Mother   . Diabetes Mother   . Cirrhosis Father   . Breast cancer Sister 17  . Breast cancer Sister 2  . Colon cancer Neg Hx     Social History Social History   Tobacco Use  . Smoking status: Former Research scientist (life sciences)  . Smokeless tobacco: Never Used  Substance Use Topics  . Alcohol use: No  . Drug use: No     Allergies   Amoxicillin, Aspirin, Codeine phosphate, Flexeril [cyclobenzaprine], Ibuprofen, Penicillins, and Tetanus toxoids   Review of Systems Review of Systems  Unable to perform ROS: Acuity of condition     Physical Exam Updated Vital Signs BP (!) 156/107   Pulse 81   Temp 98.6 F (37 C) (Oral)   Resp 17   SpO2 100%    Physical Exam Vitals signs and nursing note reviewed.  Constitutional:      Appearance: Normal appearance. She is well-developed.  HENT:     Head: Normocephalic and atraumatic.  Eyes:     General: Lids are normal.     Conjunctiva/sclera: Conjunctivae normal.     Pupils: Pupils are equal, round, and reactive to light.     Comments: PERRL. EOMs intact. No nystagmus. No neglect.   Neck:     Musculoskeletal: Full passive range of motion without pain.  Cardiovascular:     Rate and Rhythm: Normal rate and regular rhythm.     Pulses: Normal pulses.          Radial pulses are 2+ on the right side and 2+ on the left side.       Dorsalis pedis pulses are 2+ on the right side and 2+ on the left side.     Heart sounds: Normal heart sounds. No murmur. No friction rub. No gallop.   Pulmonary:     Effort: Pulmonary effort is normal.     Breath sounds: Normal breath sounds.  Abdominal:     Palpations: Abdomen is soft. Abdomen is not rigid.     Tenderness: There is no abdominal tenderness. There is no guarding.     Comments: Abdomen is soft, non-distended, non-tender. No rigidity, No guarding. No peritoneal signs.  Musculoskeletal: Normal range of motion.     Comments: Tenderness palpation of the anterior aspect of right knee.  No deformity or crepitus noted.  Limited range of motion secondary pain.  Tenderness palpation of left femur.  No deformity or crepitus noted.  Limited range of motion of right lower extremity secondary to pain.  Skin:    General: Skin is warm and dry.     Capillary Refill: Capillary refill takes less than 2 seconds.  Neurological:     Mental Status: She is alert.     Comments: Left-sided facial droop noted. Slurred speech noted. Follows most commands. She can answer some words such as telling me that this is a pen and a sock but then other  words has difficulty with. Decreased sensation noted to left face, left upper extremity, left lower extremity. No obvious pronator  drift.  4/5 strength of bilateral upper extremities.  Equal grip strength noted bilaterally. She can hold both lower extremities up against gravity but reports some pain with doing so.  Psychiatric:        Speech: Speech normal.      ED Treatments / Results  Labs (all labs ordered are listed, but only abnormal results are displayed) Labs Reviewed  CBC - Abnormal; Notable for the following components:      Result Value   Hemoglobin 11.3 (*)    MCH 25.6 (*)    RDW 16.4 (*)    All other components within normal limits  COMPREHENSIVE METABOLIC PANEL - Abnormal; Notable for the following components:   Sodium 134 (*)    CO2 17 (*)    BUN 6 (*)    Calcium 8.4 (*)    Albumin 3.4 (*)    All other components within normal limits  I-STAT CHEM 8, ED - Abnormal; Notable for the following components:   BUN 6 (*)    Calcium, Ion 0.90 (*)    Hemoglobin 11.6 (*)    HCT 34.0 (*)    All other components within normal limits  SARS CORONAVIRUS 2 (TAT 6-24 HRS)  PROTIME-INR  APTT  DIFFERENTIAL  PHENOBARBITAL LEVEL  URINALYSIS, ROUTINE W REFLEX MICROSCOPIC  CK  CBG MONITORING, ED    EKG None  Radiology Ct Head Wo Contrast  Result Date: 09/08/2019 CLINICAL DATA:  Speech difficulty EXAM: CT HEAD WITHOUT CONTRAST TECHNIQUE: Contiguous axial images were obtained from the base of the skull through the vertex without intravenous contrast. COMPARISON:  September 11, 2017 FINDINGS: Brain: No evidence of acute territorial infarction, hemorrhage, hydrocephalus,extra-axial collection or mass lesion/mass effect. There is dilatation the ventricles and sulci consistent with age-related atrophy. Low-attenuation changes in the deep white matter consistent with small vessel ischemia. Again noted is a lacunar infarct within the right basal ganglia. Vascular: No hyperdense vessel or unexpected calcification. Skull: The skull is intact. No fracture or focal lesion identified. Sinuses/Orbits: The visualized  paranasal sinuses and mastoid air cells are clear. The orbits and globes intact. Other: None IMPRESSION: No acute intracranial abnormality. Findings consistent with age related atrophy and chronic small vessel ischemia Lacunar infarct in the right basal ganglia. Electronically Signed   By: Prudencio Pair M.D.   On: 09/08/2019 15:36   Mr Brain Wo Contrast  Result Date: 09/08/2019 CLINICAL DATA:  Focal neuro deficit, greater than 6 hours, stroke suspected. Additional history provided: Patient arrives via EMS with numbness of left face, left hand and left leg, blurred vision in right eye. EXAM: MRI HEAD WITHOUT CONTRAST TECHNIQUE: Multiplanar, multiecho pulse sequences of the brain and surrounding structures were obtained without intravenous contrast. COMPARISON:  Head CT 09/08/2019. FINDINGS: Brain: The diffusion-weighted sequences are of good quality. However, there is intermittent moderate motion degradation of multiple sequences, limiting evaluation. There is an acute lacunar infarct measuring 1.1 x 0.8 x 1.8 cm (AP x TV x CC) within the right corona radiata, extending inferiorly within the right internal capsule/basal ganglia. Corresponding T2/FLAIR hyperintensity at this site. There is a background of moderate chronic small vessel ischemic disease. Prominent perivascular space versus chronic lacunar infarct within the inferior right basal ganglia. No evidence of intracranial mass. No midline shift or extra-axial fluid collection. No chronic intracranial blood products are identified. Cerebral volume is normal for age. Vascular: Flow  voids maintained within the proximal large arterial vessels. Skull and upper cervical spine: No focal marrow lesion identified on motion degraded imaging. Sinuses/Orbits: Visualized orbits demonstrate no acute abnormality. Mild ethmoid sinus mucosal thickening. No significant mastoid effusion. These results were called by telephone at the time of interpretation on 09/08/2019 at  7:35 pm to provider Dr. Joselyn Glassman, who verbally acknowledged these results. IMPRESSION: 1. Intermittently motion degraded examination as described. 2. 1.1 x 0.8 x 1.8 cm (AP x TV x CC) acute infarct within the right corona radiata, extending inferiorly within the right internal capsule/basal ganglia. 3. There is a background of moderate chronic small vessel ischemic disease. Electronically Signed   By: Kellie Simmering DO   On: 09/08/2019 19:35   Dg Knee Complete 4 Views Right  Result Date: 09/08/2019 CLINICAL DATA:  Fall, hip and knee pain EXAM: RIGHT KNEE - COMPLETE 4+ VIEW COMPARISON:  None. FINDINGS: Advanced degenerative changes within all 3 compartments of the right knee with joint space narrowing and spurring. Small joint effusion. No acute bony abnormality. Specifically, no fracture, subluxation, or dislocation. IMPRESSION: Advanced tricompartment degenerative changes. Small joint effusion. No acute bony abnormality. Electronically Signed   By: Rolm Baptise M.D.   On: 09/08/2019 19:55   Dg Femur Min 2 Views Right  Result Date: 09/08/2019 CLINICAL DATA:  Fall, hip pain EXAM: RIGHT FEMUR 2 VIEWS COMPARISON:  None. FINDINGS: Advanced arthritis in the right knee. Joint space narrowing and spurring. Small joint effusion. No acute bony abnormality. Specifically, no fracture, subluxation, or dislocation. IMPRESSION: No acute bony abnormality. Advanced degenerative changes in the right knee with small joint effusion. Electronically Signed   By: Rolm Baptise M.D.   On: 09/08/2019 19:55    Procedures Procedures (including critical care time)  Medications Ordered in ED Medications  sodium chloride flush (NS) 0.9 % injection 3 mL (3 mLs Intravenous Not Given 09/08/19 1821)  atorvastatin (LIPITOR) tablet 80 mg (has no administration in time range)  aspirin tablet 325 mg (has no administration in time range)  LORazepam (ATIVAN) tablet 1 mg (1 mg Oral Given 09/08/19 1821)     Initial Impression /  Assessment and Plan / ED Course  I have reviewed the triage vital signs and the nursing notes.  Pertinent labs & imaging results that were available during my care of the patient were reviewed by me and considered in my medical decision making (see chart for details).        68 year old female who presents for evaluation of aphasia, left upper and left lower extremity numbness is been ongoing for 3 days.  Also comes in today for unwitnessed fall that occurred this morning.  Last seen normal last night.  Has been left this morning while patient was asleep and when he came home, found patient on floor.  She states she did not have any chest pain or LOC but felt like her legs were too weak to cause her to walk.  On initial ED arrival, she is afebrile, nontoxic-appearing.  She does have slightly high blood pressure vitals otherwise stable.  She does have some slurred speech as well as left-sided facial droop.  She reports some numbness noted to left upper, left lower extremity and left face.  Concern for acute CVA.  We will plan to check labs, CT head.  CT head shows lacunar infarct that appears to be old.  CMP shows BUN of 6, creatinine of 0.61.  CBC shows no leukocytosis.  Hemoglobin is stable at 11.3.  Given concern for acute CVA, will plan for MRI.  Knee x-ray shows no acute bony abnormality.  She has evidence of degenerative changes.  MRI shows an acute infarct in the right corona radiata extending inferiorly in the right internal capsule/basal ganglia.  Given the chronicity of symptoms, she is not a candidate for TPA.  Discussed patient with Dr. Alanson Puls (Neurology).  He will see patient in the ED.  Agrees with plan for admission.  Discussed with Dr. Myna Hidalgo (hospitalist).  He will accept patient for admission.  Portions of this note were generated with [employer]. Dictation errors may occur despite best attempts at proofreading.     Final Clinical Impressions(s) / ED  Diagnoses   Final diagnoses:  Cerebrovascular accident (CVA), unspecified mechanism [facility])    ED Discharge Orders    None       Desma Mcgregor 09/08/19 2055    Lucrezia Starch, MD 09/08/19 2321

## 2019-09-08 NOTE — ED Notes (Signed)
Larey Seat (Daughter# [id number])

## 2019-09-09 ENCOUNTER — Inpatient Hospital Stay (HOSPITAL_COMMUNITY): Payer: Medicare Other

## 2019-09-09 DIAGNOSIS — G40409 Other generalized epilepsy and epileptic syndromes, not intractable, without status epilepticus: Secondary | ICD-10-CM | POA: Diagnosis present

## 2019-09-09 DIAGNOSIS — I6389 Other cerebral infarction: Secondary | ICD-10-CM

## 2019-09-09 DIAGNOSIS — J449 Chronic obstructive pulmonary disease, unspecified: Secondary | ICD-10-CM | POA: Diagnosis present

## 2019-09-09 DIAGNOSIS — G43909 Migraine, unspecified, not intractable, without status migrainosus: Secondary | ICD-10-CM | POA: Diagnosis present

## 2019-09-09 DIAGNOSIS — F419 Anxiety disorder, unspecified: Secondary | ICD-10-CM | POA: Diagnosis present

## 2019-09-09 DIAGNOSIS — R471 Dysarthria and anarthria: Secondary | ICD-10-CM | POA: Diagnosis present

## 2019-09-09 DIAGNOSIS — E669 Obesity, unspecified: Secondary | ICD-10-CM | POA: Diagnosis present

## 2019-09-09 DIAGNOSIS — F418 Other specified anxiety disorders: Secondary | ICD-10-CM | POA: Diagnosis not present

## 2019-09-09 DIAGNOSIS — F329 Major depressive disorder, single episode, unspecified: Secondary | ICD-10-CM | POA: Diagnosis present

## 2019-09-09 DIAGNOSIS — M069 Rheumatoid arthritis, unspecified: Secondary | ICD-10-CM | POA: Diagnosis present

## 2019-09-09 DIAGNOSIS — I63311 Cerebral infarction due to thrombosis of right middle cerebral artery: Secondary | ICD-10-CM | POA: Diagnosis not present

## 2019-09-09 DIAGNOSIS — M25519 Pain in unspecified shoulder: Secondary | ICD-10-CM | POA: Diagnosis present

## 2019-09-09 DIAGNOSIS — E785 Hyperlipidemia, unspecified: Secondary | ICD-10-CM | POA: Diagnosis present

## 2019-09-09 DIAGNOSIS — I6381 Other cerebral infarction due to occlusion or stenosis of small artery: Secondary | ICD-10-CM | POA: Diagnosis present

## 2019-09-09 DIAGNOSIS — I10 Essential (primary) hypertension: Secondary | ICD-10-CM | POA: Diagnosis present

## 2019-09-09 DIAGNOSIS — I639 Cerebral infarction, unspecified: Secondary | ICD-10-CM | POA: Diagnosis present

## 2019-09-09 DIAGNOSIS — G8929 Other chronic pain: Secondary | ICD-10-CM | POA: Diagnosis present

## 2019-09-09 DIAGNOSIS — Z8673 Personal history of transient ischemic attack (TIA), and cerebral infarction without residual deficits: Secondary | ICD-10-CM | POA: Diagnosis not present

## 2019-09-09 DIAGNOSIS — Z8719 Personal history of other diseases of the digestive system: Secondary | ICD-10-CM | POA: Diagnosis not present

## 2019-09-09 DIAGNOSIS — E871 Hypo-osmolality and hyponatremia: Secondary | ICD-10-CM | POA: Diagnosis present

## 2019-09-09 DIAGNOSIS — Z888 Allergy status to other drugs, medicaments and biological substances status: Secondary | ICD-10-CM | POA: Diagnosis not present

## 2019-09-09 DIAGNOSIS — K219 Gastro-esophageal reflux disease without esophagitis: Secondary | ICD-10-CM | POA: Diagnosis present

## 2019-09-09 DIAGNOSIS — Z20828 Contact with and (suspected) exposure to other viral communicable diseases: Secondary | ICD-10-CM | POA: Diagnosis present

## 2019-09-09 DIAGNOSIS — Z6835 Body mass index (BMI) 35.0-35.9, adult: Secondary | ICD-10-CM | POA: Diagnosis not present

## 2019-09-09 DIAGNOSIS — Z886 Allergy status to analgesic agent status: Secondary | ICD-10-CM | POA: Diagnosis not present

## 2019-09-09 DIAGNOSIS — Z881 Allergy status to other antibiotic agents status: Secondary | ICD-10-CM | POA: Diagnosis not present

## 2019-09-09 DIAGNOSIS — Z887 Allergy status to serum and vaccine status: Secondary | ICD-10-CM | POA: Diagnosis not present

## 2019-09-09 DIAGNOSIS — Z885 Allergy status to narcotic agent status: Secondary | ICD-10-CM | POA: Diagnosis not present

## 2019-09-09 DIAGNOSIS — W19XXXA Unspecified fall, initial encounter: Secondary | ICD-10-CM | POA: Diagnosis present

## 2019-09-09 LAB — HEMOGLOBIN A1C
Hgb A1c MFr Bld: 5.7 % — ABNORMAL HIGH (ref 4.8–5.6)
Mean Plasma Glucose: 116.89 mg/dL

## 2019-09-09 LAB — URINALYSIS, ROUTINE W REFLEX MICROSCOPIC
Bacteria, UA: NONE SEEN
Bilirubin Urine: NEGATIVE
Glucose, UA: NEGATIVE mg/dL
Ketones, ur: 20 mg/dL — AB
Leukocytes,Ua: NEGATIVE
Nitrite: NEGATIVE
Protein, ur: NEGATIVE mg/dL
Specific Gravity, Urine: >1.046 — ABNORMAL HIGH (ref 1.005–1.030)
pH: 6 (ref 5.0–8.0)

## 2019-09-09 LAB — HIV ANTIBODY (ROUTINE TESTING W REFLEX): HIV Screen 4th Generation wRfx: NONREACTIVE

## 2019-09-09 LAB — ECHOCARDIOGRAM COMPLETE: Weight: 3368.63 oz

## 2019-09-09 LAB — LIPID PANEL
Cholesterol: 206 mg/dL — ABNORMAL HIGH (ref 0–200)
HDL: 49 mg/dL (ref >40)
LDL Cholesterol: 139 mg/dL — ABNORMAL HIGH (ref 0–99)
Total CHOL/HDL Ratio: 4.2 RATIO
Triglycerides: 89 mg/dL (ref <150)
VLDL: 18 mg/dL (ref 0–40)

## 2019-09-09 LAB — SARS CORONAVIRUS 2 (TAT 6-24 HRS): SARS Coronavirus 2: NEGATIVE

## 2019-09-09 MED ORDER — UMECLIDINIUM BROMIDE 62.5 MCG/INH IN AEPB
1.0000 | INHALATION_SPRAY | Freq: Every day | RESPIRATORY_TRACT | Status: DC
  Administered 2019-09-11: 1 via RESPIRATORY_TRACT
  Filled 2019-09-09: qty 7

## 2019-09-09 MED ORDER — SENNOSIDES-DOCUSATE SODIUM 8.6-50 MG PO TABS: 1.0000 | ORAL_TABLET | Freq: Every evening | ORAL | Status: DC | PRN

## 2019-09-09 MED ORDER — GABAPENTIN 300 MG PO CAPS
900.0000 mg | ORAL_CAPSULE | Freq: Two times a day (BID) | ORAL | Status: DC
  Administered 2019-09-09 – 2019-09-11 (×5): 900 mg via ORAL
  Filled 2019-09-09 (×5): qty 3

## 2019-09-09 MED ORDER — STROKE: EARLY STAGES OF RECOVERY BOOK
Freq: Once | Status: AC
  Administered 2019-09-09: 04:00:00
  Filled 2019-09-09: qty 1

## 2019-09-09 MED ORDER — TOPIRAMATE 100 MG PO TABS
100.0000 mg | ORAL_TABLET | Freq: Every day | ORAL | Status: DC
  Administered 2019-09-09 – 2019-09-10 (×2): 100 mg via ORAL
  Filled 2019-09-09 (×2): qty 1

## 2019-09-09 MED ORDER — ENOXAPARIN SODIUM 40 MG/0.4ML ~~LOC~~ SOLN
40.0000 mg | SUBCUTANEOUS | Status: DC
  Administered 2019-09-09 – 2019-09-11 (×3): 40 mg via SUBCUTANEOUS
  Filled 2019-09-09 (×3): qty 0.4

## 2019-09-09 MED ORDER — ACETAMINOPHEN 160 MG/5ML PO SOLN: 650.0000 mg | ORAL | Status: DC | PRN

## 2019-09-09 MED ORDER — ALBUTEROL SULFATE (2.5 MG/3ML) 0.083% IN NEBU: 2.5000 mg | INHALATION_SOLUTION | Freq: Four times a day (QID) | RESPIRATORY_TRACT | Status: DC | PRN

## 2019-09-09 MED ORDER — ASPIRIN EC 81 MG PO TBEC
81.0000 mg | DELAYED_RELEASE_TABLET | Freq: Every day | ORAL | Status: DC
  Administered 2019-09-09 – 2019-09-11 (×3): 81 mg via ORAL
  Filled 2019-09-09 (×3): qty 1

## 2019-09-09 MED ORDER — TIOTROPIUM BROMIDE MONOHYDRATE 18 MCG IN CAPS: 18.0000 ug | ORAL_CAPSULE | Freq: Every day | RESPIRATORY_TRACT | Status: DC

## 2019-09-09 MED ORDER — TOPIRAMATE 25 MG PO TABS: 50.0000 mg | ORAL_TABLET | ORAL | Status: DC

## 2019-09-09 MED ORDER — CLOPIDOGREL BISULFATE 75 MG PO TABS
75.0000 mg | ORAL_TABLET | Freq: Every day | ORAL | Status: DC
  Administered 2019-09-09 – 2019-09-11 (×3): 75 mg via ORAL
  Filled 2019-09-09 (×3): qty 1

## 2019-09-09 MED ORDER — ACETAMINOPHEN 650 MG RE SUPP: 650.0000 mg | RECTAL | Status: DC | PRN

## 2019-09-09 MED ORDER — SODIUM CHLORIDE 0.9 % IV SOLN
INTRAVENOUS | Status: DC
  Administered 2019-09-09: 04:00:00 via INTRAVENOUS

## 2019-09-09 MED ORDER — PHENOBARBITAL 100 MG PO TABS: 100.0000 mg | ORAL_TABLET | Freq: Every day | ORAL | Status: DC

## 2019-09-09 MED ORDER — HYDROCODONE-ACETAMINOPHEN 10-325 MG PO TABS
1.0000 | ORAL_TABLET | Freq: Every day | ORAL | Status: DC
  Administered 2019-09-09 – 2019-09-11 (×3): 1 via ORAL
  Filled 2019-09-09 (×3): qty 1

## 2019-09-09 MED ORDER — PANTOPRAZOLE SODIUM 40 MG PO TBEC
40.0000 mg | DELAYED_RELEASE_TABLET | Freq: Every day | ORAL | Status: DC
  Administered 2019-09-09 – 2019-09-11 (×3): 40 mg via ORAL
  Filled 2019-09-09 (×3): qty 1

## 2019-09-09 MED ORDER — ONDANSETRON HCL 4 MG/2ML IJ SOLN
4.0000 mg | Freq: Four times a day (QID) | INTRAMUSCULAR | Status: DC | PRN
  Administered 2019-09-10 (×2): 4 mg via INTRAVENOUS
  Filled 2019-09-09 (×2): qty 2

## 2019-09-09 MED ORDER — CITALOPRAM HYDROBROMIDE 10 MG PO TABS
20.0000 mg | ORAL_TABLET | Freq: Every day | ORAL | Status: DC
  Administered 2019-09-09 – 2019-09-10 (×2): 20 mg via ORAL
  Filled 2019-09-09 (×3): qty 2

## 2019-09-09 MED ORDER — ACETAMINOPHEN 325 MG PO TABS
650.0000 mg | ORAL_TABLET | ORAL | Status: DC | PRN
  Administered 2019-09-09 – 2019-09-10 (×3): 650 mg via ORAL
  Filled 2019-09-09 (×3): qty 2

## 2019-09-09 MED ORDER — PHENOBARBITAL 32.4 MG PO TABS
97.2000 mg | ORAL_TABLET | Freq: Every day | ORAL | Status: DC
  Administered 2019-09-09 – 2019-09-11 (×3): 97.2 mg via ORAL
  Filled 2019-09-09 (×3): qty 3

## 2019-09-09 MED ORDER — TOPIRAMATE 25 MG PO TABS
50.0000 mg | ORAL_TABLET | Freq: Every day | ORAL | Status: DC
  Administered 2019-09-09 – 2019-09-11 (×3): 50 mg via ORAL
  Filled 2019-09-09 (×3): qty 2

## 2019-09-09 MED ORDER — HYDROCODONE-ACETAMINOPHEN 10-325 MG PO TABS
2.0000 | ORAL_TABLET | Freq: Every day | ORAL | Status: DC
  Administered 2019-09-09 – 2019-09-10 (×2): 2 via ORAL
  Filled 2019-09-09 (×2): qty 2

## 2019-09-09 NOTE — Progress Notes (Signed)
Rehab Admissions Coordinator Note:  Per OT recommendation, this patient was screened by Raechel Ache for appropriateness for an Inpatient Acute Rehab Consult.  At this time, we are recommending Inpatient Rehab consult. I will place a rehab consult order in the chart. An AC will follow up for further assessment to determine candidacy for a possible IP Rehab admission.   Raechel Ache 09/09/2019, 4:38 PM  I can be reached at [phone].

## 2019-09-09 NOTE — Progress Notes (Signed)
Progress Note    [name]  [id number] DOB: 1951-04-15  DOA: 09/08/2019 PCP: Patriciaann Clan, DO    Brief Narrative:      Medical records reviewed and are as summarized below:  [name] is an 68 y.o. female with medical history significant for rheumatoid arthritis, COPD, seizure disorder, depression with anxiety, and history of TIA, now presenting to the emergency department for evaluation of focal numbness, speech difficulty, and blurred vision.  Patient developed left-sided numbness and weakness several days ago which has progressively worsened and resulted in a fall today.  Patient reported difficulty finding the right words to use and also felt as though her speech was slurred. She also reports some weakness to a lesser extent involving the right side.   Assessment/Plan:   Principal Problem:   Acute ischemic stroke [facility]) Active Problems:   Depression with anxiety   Generalized convulsive epilepsy ([place])   Rheumatoid arthritis ([place])   COPD mixed type ([place])   CVA (cerebral vascular accident) ([place])   Acute ischemic CVA  -  acute infarction involving right corona radiata on MRI in ED  - Neurology consult - PT/OT - CTA head and neck: Negative for large vessel occlusion.  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  -echo -A1c: 5.7 -lipid panel: LDL: 139 - Start high-intensity statin and ASA   -permissive HTN  Seizure disorder  - topamax/neurontin/phenobarb.  Chronic bronchitis  - Stable, continue Spiriva and as-needed albuterol    Rheumatoid arthritis  - Patient reports chronic shoulder pain, managed with adalimumab  - She will continue follow-up with Rheumatology    Obesity Estimated body mass index is 35.04 kg/m as calculated from the following:   Height as of 05/05/19: 5\' 5"  (1.651 m).   Weight as of this encounter: 95.5  kg.   Family Communication/Anticipated D/C date and plan/Code Status   DVT prophylaxis: Lovenox ordered. Code Status: Full Code.  Family Communication:  Disposition Plan:    Medical Consultants:    Neurology  Subjective:   Getting echo C/o nausea and headache  Objective:    Vitals:   09/09/19 0235 09/09/19 0312 09/09/19 0613 09/09/19 0745  BP: 138/74 (!) 154/95 (!) 141/83 (!) 162/90  Pulse: 78 88 91 89  Resp: (!) 26 (!) 22 18 19   Temp: 98.2 F (36.8 C) 98.4 F (36.9 C) 98.3 F (36.8 C) 98.5 F (36.9 C)  TempSrc: Oral Oral Oral Oral  SpO2: 96% 98% 99% 94%  Weight:  95.5 kg      Intake/Output Summary (Last 24 hours) at 09/09/2019 1031 Last data filed at 09/09/2019 0500 Gross per 24 hour  Intake 112.17 ml  Output --  Net 112.17 ml   Filed Weights   09/09/19 0312  Weight: 95.5 kg    Exam: In bed, getting echo done Sleepy Speech difficult to understand rrr No increased work of breathing   Data Reviewed:   I have personally reviewed following labs and imaging studies:  Labs: Labs show the following:   Basic Metabolic Panel: Recent Labs  Lab 09/08/19 1440 09/08/19 1449  NA 134* 138  K 4.0 4.0  CL 104 108  CO2 17*  --   GLUCOSE 96 99  BUN 6* 6*  CREATININE 0.61 0.50  CALCIUM 8.4*  --    GFR Estimated Creatinine Clearance: 74.8 mL/min (by C-G formula based on SCr of 0.5 mg/dL). Liver Function Tests: Recent  Labs  Lab 09/08/19 1440  AST 19  ALT QUANTITY NOT SUFFICIENT, UNABLE TO PERFORM TEST  ALKPHOS 97  BILITOT QUANTITY NOT SUFFICIENT, UNABLE TO PERFORM TEST  PROT 7.6  ALBUMIN 3.4*   No results for input(s): LIPASE, AMYLASE in the last 168 hours. No results for input(s): AMMONIA in the last 168 hours. Coagulation profile Recent Labs  Lab 09/08/19 1430  INR 1.1    CBC: Recent Labs  Lab 09/08/19 1430 09/08/19 1449  WBC 7.9  --   NEUTROABS 4.9  --   HGB 11.3* 11.6*  HCT 36.8 34.0*  MCV 83.4  --   PLT 212  --     Cardiac Enzymes: Recent Labs  Lab 09/08/19 2015  CKTOTAL 37*   BNP (last 3 results) No results for input(s): PROBNP in the last 8760 hours. CBG: No results for input(s): GLUCAP in the last 168 hours. D-Dimer: No results for input(s): DDIMER in the last 72 hours. Hgb A1c: Recent Labs    09/09/19 0408  HGBA1C 5.7*   Lipid Profile: Recent Labs    09/09/19 0408  CHOL 206*  HDL 49  LDLCALC 139*  TRIG 89  CHOLHDL 4.2   Thyroid function studies: No results for input(s): TSH, T4TOTAL, T3FREE, THYROIDAB in the last 72 hours.  Invalid input(s): FREET3 Anemia work up: No results for input(s): VITAMINB12, FOLATE, FERRITIN, TIBC, IRON, RETICCTPCT in the last 72 hours. Sepsis Labs: Recent Labs  Lab 09/08/19 1430  WBC 7.9    Microbiology Recent Results (from the past 240 hour(s))  SARS CORONAVIRUS 2 (TAT 6-24 HRS) Nasopharyngeal Nasopharyngeal Swab     Status: None   Collection Time: 09/08/19  7:56 PM   Specimen: Nasopharyngeal Swab  Result Value Ref Range Status   SARS Coronavirus 2 NEGATIVE NEGATIVE Final    Comment: (NOTE) SARS-CoV-2 target nucleic acids are NOT DETECTED. The SARS-CoV-2 RNA is generally detectable in upper and lower respiratory specimens during the acute phase of infection. Negative results do not preclude SARS-CoV-2 infection, do not rule out co-infections with other pathogens, and should not be used as the sole basis for treatment or other patient management decisions. Negative results must be combined with clinical observations, patient history, and epidemiological information. The expected result is Negative. Fact Sheet for Patients: SugarRoll.be Fact Sheet for Healthcare Providers: https://www.woods-mathews.com/ This test is not yet approved or cleared by the [place] FDA and  has been authorized for detection and/or diagnosis of SARS-CoV-2 by FDA under an Emergency Use Authorization (EUA). This  EUA will remain  in effect (meaning this test can be used) for the duration of the COVID-19 declaration under Section 56 4(b)(1) of the Act, 21 U.S.C. section 360bbb-3(b)(1), unless the authorization is terminated or revoked sooner. Performed at [facility], [place] [address], [place], [place] [address]     Procedures and diagnostic studies:  Ct Angio Head W Or Wo Contrast  Result Date: 09/08/2019 CLINICAL DATA:  68 year old female with acute lacunar infarct of the right corona radiata and posterior lentiform on MRI today. EXAM: CT ANGIOGRAPHY HEAD AND NECK TECHNIQUE: Multidetector CT imaging of the head and neck was performed using the standard protocol during bolus administration of intravenous contrast. Multiplanar CT image reconstructions and MIPs were obtained to evaluate the vascular anatomy. Carotid stenosis measurements (when applicable) are obtained utilizing NASCET criteria, using the distal internal carotid diameter as the denominator. CONTRAST:  15mL OMNIPAQUE IOHEXOL 350 MG/ML SOLN COMPARISON:  Brain MRI earlier today. Head CT 1520 hours  today. FINDINGS: CTA NECK Skeleton: Absent dentition. No acute osseous abnormality identified. Upper chest: Negative. Other neck: Negative. Aortic arch: 3 vessel arch configuration. Distal arch soft and calcified plaque. Right carotid system: Tortuous brachiocephalic artery and proximal right CCA without stenosis. Minimal plaque proximal to the bifurcation. Mild calcified plaque at the bifurcation without stenosis. Tortuous cervical right ICA. Left carotid system: Tortuous proximal left CCA without stenosis. Mild calcified plaque at the posterior left ICA origin without stenosis. Tortuous cervical left ICA. Vertebral arteries: Soft plaque at the right subclavian artery origin without stenosis. Normal right vertebral artery origin. Tortuous right V1 segment. Diminutive but patent right vertebral artery to the skull base. Soft and calcified plaque in  the proximal left subclavian artery without significant stenosis. Normal left vertebral artery origin. Tortuous left V1 segment. The left vertebral is also somewhat diminutive but patent to the skull base without stenosis. CTA HEAD Posterior circulation: Codominant distal vertebral arteries without stenosis. The right is diminutive beyond the PICA origin. Normal left PICA origin. Patent vertebrobasilar junction. Tortuous and somewhat diminutive basilar artery with no stenosis. The basilar functionally terminates in patent SCA origins with fetal type bilateral PCA is. Bilateral PCA branches are within normal limits. Anterior circulation: Both ICA siphons are patent. On the left there is moderate calcified plaque, but ectasia of the left siphon without stenosis. Normal left posterior communicating artery origin. On the right there is mild to moderate calcified plaque and right ICA ectasia without stenosis. Normal right posterior communicating artery origin. Patent, tortuous carotid termini. Normal MCA and ACA origins. Anterior communicating artery and bilateral ACA branches are normal. Left MCA M1 segment and bifurcation are patent without stenosis. Left MCA branches are within normal limits. Right MCA M1 segment bifurcates early without stenosis. Right MCA branches are within normal limits. Venous sinuses: Early contrast timing, not well evaluated. Anatomic variants: Diminutive vertebrobasilar system in the setting of fetal type PCA origins. Review of the MIP images confirms the above findings IMPRESSION: 1. Negative for large vessel occlusion 2. 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 3. Diminutive vertebrobasilar system in the setting of fetal PCA origins (normal variant). 4. Mild atherosclerosis of the visible aorta. Electronically Signed   By: Genevie Ann M.D.   On: 09/08/2019 22:14   Dg Pelvis 1-2  Views  Result Date: 09/08/2019 CLINICAL DATA:  Fall with hip pain EXAM: PELVIS - 1-2 VIEW COMPARISON:  CT 06/10/2018 FINDINGS: SI joints are non widened. The pubic symphysis and rami appear intact. No fracture or malalignment. Right femoral neck is partially obscured by the trochanter IMPRESSION: No definite acute osseous abnormality Electronically Signed   By: Donavan Foil M.D.   On: 09/08/2019 19:56   Ct Head Wo Contrast  Result Date: 09/08/2019 CLINICAL DATA:  Speech difficulty EXAM: CT HEAD WITHOUT CONTRAST TECHNIQUE: Contiguous axial images were obtained from the base of the skull through the vertex without intravenous contrast. COMPARISON:  September 11, 2017 FINDINGS: Brain: No evidence of acute territorial infarction, hemorrhage, hydrocephalus,extra-axial collection or mass lesion/mass effect. There is dilatation the ventricles and sulci consistent with age-related atrophy. Low-attenuation changes in the deep white matter consistent with small vessel ischemia. Again noted is a lacunar infarct within the right basal ganglia. Vascular: No hyperdense vessel or unexpected calcification. Skull: The skull is intact. No fracture or focal lesion identified. Sinuses/Orbits: The visualized paranasal sinuses and mastoid air cells are clear. The orbits and globes intact.  Other: None IMPRESSION: No acute intracranial abnormality. Findings consistent with age related atrophy and chronic small vessel ischemia Lacunar infarct in the right basal ganglia. Electronically Signed   By: Prudencio Pair M.D.   On: 09/08/2019 15:36   Ct Angio Neck W Or Wo Contrast  Result Date: 09/08/2019 CLINICAL DATA:  68 year old female with acute lacunar infarct of the right corona radiata and posterior lentiform on MRI today. EXAM: CT ANGIOGRAPHY HEAD AND NECK TECHNIQUE: Multidetector CT imaging of the head and neck was performed using the standard protocol during bolus administration of intravenous contrast. Multiplanar CT image  reconstructions and MIPs were obtained to evaluate the vascular anatomy. Carotid stenosis measurements (when applicable) are obtained utilizing NASCET criteria, using the distal internal carotid diameter as the denominator. CONTRAST:  76mL OMNIPAQUE IOHEXOL 350 MG/ML SOLN COMPARISON:  Brain MRI earlier today. Head CT 1520 hours today. FINDINGS: CTA NECK Skeleton: Absent dentition. No acute osseous abnormality identified. Upper chest: Negative. Other neck: Negative. Aortic arch: 3 vessel arch configuration. Distal arch soft and calcified plaque. Right carotid system: Tortuous brachiocephalic artery and proximal right CCA without stenosis. Minimal plaque proximal to the bifurcation. Mild calcified plaque at the bifurcation without stenosis. Tortuous cervical right ICA. Left carotid system: Tortuous proximal left CCA without stenosis. Mild calcified plaque at the posterior left ICA origin without stenosis. Tortuous cervical left ICA. Vertebral arteries: Soft plaque at the right subclavian artery origin without stenosis. Normal right vertebral artery origin. Tortuous right V1 segment. Diminutive but patent right vertebral artery to the skull base. Soft and calcified plaque in the proximal left subclavian artery without significant stenosis. Normal left vertebral artery origin. Tortuous left V1 segment. The left vertebral is also somewhat diminutive but patent to the skull base without stenosis. CTA HEAD Posterior circulation: Codominant distal vertebral arteries without stenosis. The right is diminutive beyond the PICA origin. Normal left PICA origin. Patent vertebrobasilar junction. Tortuous and somewhat diminutive basilar artery with no stenosis. The basilar functionally terminates in patent SCA origins with fetal type bilateral PCA is. Bilateral PCA branches are within normal limits. Anterior circulation: Both ICA siphons are patent. On the left there is moderate calcified plaque, but ectasia of the left siphon  without stenosis. Normal left posterior communicating artery origin. On the right there is mild to moderate calcified plaque and right ICA ectasia without stenosis. Normal right posterior communicating artery origin. Patent, tortuous carotid termini. Normal MCA and ACA origins. Anterior communicating artery and bilateral ACA branches are normal. Left MCA M1 segment and bifurcation are patent without stenosis. Left MCA branches are within normal limits. Right MCA M1 segment bifurcates early without stenosis. Right MCA branches are within normal limits. Venous sinuses: Early contrast timing, not well evaluated. Anatomic variants: Diminutive vertebrobasilar system in the setting of fetal type PCA origins. Review of the MIP images confirms the above findings IMPRESSION: 1. Negative for large vessel occlusion 2. 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 3. Diminutive vertebrobasilar system in the setting of fetal PCA origins (normal variant). 4. Mild atherosclerosis of the visible aorta. Electronically Signed   By: Genevie Ann M.D.   On: 09/08/2019 22:14   Mr Brain Wo Contrast  Result Date: 09/08/2019 CLINICAL DATA:  Focal neuro deficit, greater than 6 hours, stroke suspected. Additional history provided: Patient arrives via EMS with numbness of left face, left hand and left leg, blurred vision in right eye. EXAM: MRI HEAD WITHOUT CONTRAST  TECHNIQUE: Multiplanar, multiecho pulse sequences of the brain and surrounding structures were obtained without intravenous contrast. COMPARISON:  Head CT 09/08/2019. FINDINGS: Brain: The diffusion-weighted sequences are of good quality. However, there is intermittent moderate motion degradation of multiple sequences, limiting evaluation. There is an acute lacunar infarct measuring 1.1 x 0.8 x 1.8 cm (AP x TV x CC) within the right corona radiata, extending inferiorly within  the right internal capsule/basal ganglia. Corresponding T2/FLAIR hyperintensity at this site. There is a background of moderate chronic small vessel ischemic disease. Prominent perivascular space versus chronic lacunar infarct within the inferior right basal ganglia. No evidence of intracranial mass. No midline shift or extra-axial fluid collection. No chronic intracranial blood products are identified. Cerebral volume is normal for age. Vascular: Flow voids maintained within the proximal large arterial vessels. Skull and upper cervical spine: No focal marrow lesion identified on motion degraded imaging. Sinuses/Orbits: Visualized orbits demonstrate no acute abnormality. Mild ethmoid sinus mucosal thickening. No significant mastoid effusion. These results were called by telephone at the time of interpretation on 09/08/2019 at 7:35 pm to provider Dr. Joselyn Glassman, who verbally acknowledged these results. IMPRESSION: 1. Intermittently motion degraded examination as described. 2. 1.1 x 0.8 x 1.8 cm (AP x TV x CC) acute infarct within the right corona radiata, extending inferiorly within the right internal capsule/basal ganglia. 3. There is a background of moderate chronic small vessel ischemic disease. Electronically Signed   By: Kellie Simmering DO   On: 09/08/2019 19:35   Dg Knee Complete 4 Views Right  Result Date: 09/08/2019 CLINICAL DATA:  Fall, hip and knee pain EXAM: RIGHT KNEE - COMPLETE 4+ VIEW COMPARISON:  None. FINDINGS: Advanced degenerative changes within all 3 compartments of the right knee with joint space narrowing and spurring. Small joint effusion. No acute bony abnormality. Specifically, no fracture, subluxation, or dislocation. IMPRESSION: Advanced tricompartment degenerative changes. Small joint effusion. No acute bony abnormality. Electronically Signed   By: Rolm Baptise M.D.   On: 09/08/2019 19:55   Dg Femur Min 2 Views Right  Result Date: 09/08/2019 CLINICAL DATA:  Fall, hip pain EXAM: RIGHT  FEMUR 2 VIEWS COMPARISON:  None. FINDINGS: Advanced arthritis in the right knee. Joint space narrowing and spurring. Small joint effusion. No acute bony abnormality. Specifically, no fracture, subluxation, or dislocation. IMPRESSION: No acute bony abnormality. Advanced degenerative changes in the right knee with small joint effusion. Electronically Signed   By: Rolm Baptise M.D.   On: 09/08/2019 19:55    Medications:    aspirin EC  81 mg Oral Daily   atorvastatin  80 mg Oral q1800   citalopram  20 mg Oral QHS   clopidogrel  75 mg Oral Daily   enoxaparin (LOVENOX) injection  40 mg Subcutaneous Q24H   gabapentin  900 mg Oral BID   HYDROcodone-acetaminophen  1 tablet Oral Daily   HYDROcodone-acetaminophen  2 tablet Oral QHS   pantoprazole  40 mg Oral Daily   phenobarbital  97.2 mg Oral Daily   sodium chloride flush  10-40 mL Intracatheter Q12H   sodium chloride flush  3 mL Intravenous Once   topiramate  100 mg Oral QHS   topiramate  50 mg Oral Daily   umeclidinium bromide  1 puff Inhalation Daily   Continuous Infusions:  sodium chloride 75 mL/hr at 09/09/19 0330     LOS: 0 days   Geradine Girt  [facility]   How to contact the [facility] Attending or Consulting provider [place] or covering  provider during after hours [place], for this patient?  1. Check the care team in [facility] and look for a) attending/consulting [facility] provider listed and b) the [facility] team listed 2. Log into [email or web address] and use [place]'s universal password to access. If you do not have the password, please contact the hospital operator. 3. Locate the [facility] provider you are looking for under [facility] and page to a number that you can be directly reached. 4. If you still have difficulty reaching the provider, please page the [facility] (Director on Call) for the Hospitalists listed on amion for assistance.  09/09/2019, 10:31 AM

## 2019-09-09 NOTE — Progress Notes (Signed)
Occupational Therapy Evaluation Patient Details Name: Ketara Maestre MRN: [id number] DOB: [birth year] Today's Date: 09/09/2019    History of Present Illness Sharleen Brancaccio is an 68 y.o. female with medical history significant forrheumatoid arthritis, COPD, seizure disorder, depression with anxiety, and history of TIA, now presenting to the emergency department for evaluation of focal numbness, speech difficulty, and blurred vision. Patient developed left-sided numbness and weaknessseveral days ago which has progressively worsened and resulted in a fall today. 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    Clinical Impression   PTA, pt was living at home with her husband, and was independent with ADL/IADL and modified independent with functional mobility at rollator level. Pt's husband was driving, pt does not drive. Pt provided PLOF and home information, confirmed by husband. Pt currently requires modA for bed mobility, modA-maxA for ADL and modA for functional mobility at RW level. Pt continues to report numbness in LUE and LLE. Pt's cognitive limitations and physical limitations impact her safety and independence with ADL/IADL and functional mobility. Due to decline in current level of function, pt would benefit from acute OT to address established goals to facilitate safe D/C to venue listed below. At this time, recommend CIR follow-up. Will continue to follow acutely.     Follow Up Recommendations  CIR;Supervision/Assistance - 24 hour    Equipment Recommendations  3 in 1 bedside commode    Recommendations for Other Services PT consult     Precautions / Restrictions Precautions Precautions: Fall Restrictions Weight Bearing Restrictions: No      Mobility Bed Mobility Overal bed mobility: Needs Assistance Bed  Mobility: Rolling;Sidelying to Sit;Sit to Supine Rolling: Mod assist Sidelying to sit: Mod assist   Sit to supine: Max assist   General bed mobility comments: modA to progress trunk upright;maxA to return to supine to progress BLE into bed  Transfers Overall transfer level: Needs assistance Equipment used: Rolling walker (2 wheeled) Transfers: Sit to/from [facility] Sit to Stand: Mod assist Stand pivot transfers: Mod assist;Min assist       General transfer comment: modA to powerup into standing;minA-modA for stability and safe use of RW     Balance Overall balance assessment: Needs assistance Sitting-balance support: No upper extremity supported;Feet supported Sitting balance-Leahy Scale: Fair     Standing balance support: Bilateral upper extremity supported Standing balance-Leahy Scale: Poor Standing balance comment: heavy reliance on BUE support on RW                           ADL either performed or assessed with clinical judgement   ADL Overall ADL's : Needs assistance/impaired Eating/Feeding: Set up;Sitting   Grooming: Minimal assistance;Sitting;Moderate assistance   Upper Body Bathing: Moderate assistance;Sitting   Lower Body Bathing: Maximal assistance;Sit to/from stand   Upper Body Dressing : Moderate assistance;Sitting   Lower Body Dressing: Maximal assistance;Sit to/from stand Lower Body Dressing Details (indicate cue type and reason): maxA to access BLE Toilet Transfer: Minimal assistance;Moderate assistance;Stand-pivot Toilet Transfer Details (indicate cue type and reason): simulated stand pivot;modA for stability and vc  Toileting- Clothing Manipulation and Hygiene: Moderate assistance;Sit to/from stand       Functional mobility during ADLs: Minimal assistance;Moderate assistance;Rolling walker General ADL Comments: limited by cognition, decreased activity tolerance, L sided weakness and numbness     Vision Baseline  Vision/History: Wears glasses  Wears Glasses: Reading only Patient Visual Report: Blurring of vision Vision Assessment?: Yes Eye Alignment: Within Functional Limits Ocular Range of Motion: Restricted looking up;Restricted on the left Alignment/Gaze Preference: Head turned Tracking/Visual Pursuits: Left eye does not track medially;Decreased smoothness of horizontal tracking;Decreased smoothness of vertical tracking;Requires cues, head turns, or add eye shifts to track;Unable to hold eye position out of midline;Decreased smoothness of eye movement to LEFT superior field;Decreased smoothness of eye movement to RIGHT superior field Saccades: Impaired - to be further tested in functional context Convergence: Impaired - to be further tested in functional context Visual Fields: Left visual field deficit;Impaired-to be further tested in functional context     Perception     Praxis      Pertinent Vitals/Pain Pain Assessment: 0-10 Pain Score: 9  Faces Pain Scale: Hurts little more Pain Location: head Pain Descriptors / Indicators: Constant Pain Intervention(s): Limited activity within patient's tolerance;Monitored during session     Hand Dominance Right   Extremity/Trunk Assessment Upper Extremity Assessment Upper Extremity Assessment: Generalized weakness;LUE deficits/detail LUE Deficits / Details: supine AROM shoulder flexion full 180*;sitting EOB AROM shoulder flexion ~100*;strength grossly 3-/5;reports numbness in UE;decreased coordinated movements;decreased rapid alternating movements LUE Sensation: decreased light touch LUE Coordination: decreased fine motor;decreased gross motor   Lower Extremity Assessment Lower Extremity Assessment: Defer to PT evaluation;RLE deficits/detail;LLE deficits/detail RLE Deficits / Details: pt reports prior knee pain impacting mobility;pt with decreased knee flexion and extension LLE Deficits / Details: reports numbness in LLE LLE Sensation: decreased  light touch LLE Coordination: decreased fine motor;decreased gross motor   Cervical / Trunk Assessment Cervical / Trunk Assessment: Normal   Communication Communication Communication: Expressive difficulties   Cognition Arousal/Alertness: Awake/alert Behavior During Therapy: WFL for tasks assessed/performed Overall Cognitive Status: Impaired/Different from baseline Area of Impairment: Orientation;Attention;Memory;Following commands;Safety/judgement;Problem solving                 Orientation Level: Disoriented to;Time Current Attention Level: Selective Memory: Decreased short-term memory Following Commands: Follows one step commands with increased time;Follows one step commands inconsistently Safety/Judgement: Decreased awareness of safety   Problem Solving: Slow processing;Difficulty sequencing;Requires verbal cues;Requires tactile cues General Comments: stated the month was 2020, after numerous prompts and extended time pt able to state "the month is november, cause my birthday is november";requires increased time to process information;multimodal cues for sequencing   General Comments  pt's husband present during session;educated pt and husband on benefits of mobility and use of LUE     Exercises     Shoulder Instructions      Home Living Family/patient expects to be discharged to:: Private residence Living Arrangements: Spouse/significant other   Type of Home: House Home Access: Ramped entrance     [place]: One level     Bathroom Shower/Tub: Occupational psychologist: Handicapped height Bathroom Accessibility: No   Home Equipment: Grab bars - toilet;Grab bars - tub/shower;Walker - 4 wheels;Bedside commode      Lives With: Spouse    Prior Functioning/Environment Level of Independence: Independent with assistive device(s)        Comments: husband does the driving, pt was using a rollator        OT Problem List: Decreased  strength;Decreased range of motion;Decreased activity tolerance;Impaired balance (sitting and/or standing);Decreased cognition;Decreased safety awareness;Decreased coordination;Impaired vision/perception;Decreased knowledge of use of DME or AE;Decreased knowledge of precautions;Impaired UE functional use;Pain      OT Treatment/Interventions: Self-care/ADL training;Therapeutic exercise;Energy conservation;DME and/or AE instruction;Neuromuscular education;Therapeutic activities;Cognitive remediation/compensation;Visual/perceptual remediation/compensation;Patient/family education;Balance training  OT Goals(Current goals can be found in the care plan section) Acute Rehab OT Goals Patient Stated Goal: to get back to baseline OT Goal Formulation: With patient Time For Goal Achievement: 09/23/19 Potential to Achieve Goals: Good ADL Goals Pt Will Perform Grooming: with modified independence;sitting;standing Pt Will Perform Upper Body Dressing: with modified independence;sitting;standing Pt Will Perform Lower Body Dressing: with modified independence;sit to/from stand Pt Will Transfer to Toilet: with modified independence;ambulating Additional ADL Goal #1: Pt will follow multi-step commands consistently during ADL/IADL and functional mobility.  OT Frequency: Min 2X/week   Barriers to D/C: Decreased caregiver support  pt's husband unable to provide level of assist pt currently requires       Co-evaluation              AM-PAC OT "6 Clicks" Daily Activity     Outcome Measure Help from another person eating meals?: A Little Help from another person taking care of personal grooming?: A Little Help from another person toileting, which includes using toliet, bedpan, or urinal?: A Lot Help from another person bathing (including washing, rinsing, drying)?: A Lot Help from another person to put on and taking off regular upper body clothing?: A Lot Help from another person to put on and taking off  regular lower body clothing?: A Little 6 Click Score: 15   End of Session Equipment Utilized During Treatment: Gait belt;Rolling walker Nurse Communication: Mobility status  Activity Tolerance: Patient tolerated treatment well Patient left: in bed;with call bell/phone within reach;with bed alarm set;with family/visitor present  OT Visit Diagnosis: Unsteadiness on feet (R26.81);Other abnormalities of gait and mobility (R26.89);Muscle weakness (generalized) (M62.81);History of falling (Z91.81);Pain;Cognitive communication deficit (R41.841) Symptoms and signs involving cognitive functions: Cerebral infarction Pain - part of body: (head)                Time: 1341-1426 OT Time Calculation (min): 45 min Charges:  OT General Charges $OT Visit: 1 Visit OT Evaluation $OT Eval Moderate Complexity: 1 Mod OT Treatments $Self Care/Home Management : 23-37 mins  Dorinda Hill OTR/L Acute Rehabilitation Services Office: [place] 09/09/2019, 2:50 PM

## 2019-09-09 NOTE — Progress Notes (Signed)
Pt admitted from ED with stroke diagnosis, pt alert and oriented c/o of slight headache, was settled in bed with call light within pt's reach, tele monitor put and verified on pt, safety measures explained and initiated, was however reassured and will continue to monitor. [place]-Asidi, Brunella Wileman Efe

## 2019-09-09 NOTE — Evaluation (Signed)
Physical Therapy Evaluation Patient Details Name: [name] MRN: [id number] DOB: 05-22-1951 Today's Date: 09/09/2019   History of Present Illness  [name] is an 68 y.o. female with medical history significant forrheumatoid arthritis, COPD, seizure disorder, depression with anxiety, and history of TIA, now presenting to the emergency department for evaluation of focal numbness, speech difficulty, and blurred vision. Patient developed left-sided numbness and weaknessseveral days ago which has progressively worsened and resulted in a fall today. 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   Clinical Impression  Pt admitted with above. Pt presents with decreased functional mobility secondary to decreased left sided sensation, left sided weakness, chronic right knee pain, balance impairments, and decreased cognition. Requiring moderate assist for transfers to standing, able to perform side steps at edge of bed. Suspect pt will be able to progress ambulation tomorrow. Prior to admission, pt living with her husband and modI using a Rollator. Would benefit from CIR to address deficits and maximize functional independence.      Follow Up Recommendations CIR    Equipment Recommendations  Other (comment)(TBA)    Recommendations for Other Services Rehab consult     Precautions / Restrictions Precautions Precautions: Fall Restrictions Weight Bearing Restrictions: No      Mobility  Bed Mobility Overal bed mobility: Needs Assistance Bed Mobility: Sit to Supine;Supine to Sit  Supine to sit: Min guard Sit to supine: Mod assist   General bed mobility comments: pt able to progress to edge of bed with min guard assist and increased time, modA for BLE management back into bed  Transfers Overall transfer level:  Needs assistance Equipment used: Rolling walker (2 wheeled) Transfers: Sit to/from Stand Sit to Stand: Mod assist     General transfer comment: pt requiring modA to power up to standing, increased trunk flexion, cues for placing both hands on walker. Able to take side steps up to head of bed with increased time  Ambulation/Gait                Stairs            Wheelchair Mobility    Modified Rankin (Stroke Patients Only) Modified Rankin (Stroke Patients Only) Pre-Morbid Rankin Score: Slight disability Modified Rankin: Moderately severe disability     Balance Overall balance assessment: Needs assistance Sitting-balance support: No upper extremity supported;Feet supported Sitting balance-Leahy Scale: Fair     Standing balance support: Bilateral upper extremity supported Standing balance-Leahy Scale: Poor Standing balance comment: heavy reliance on BUE support on RW                             Pertinent Vitals/Pain Pain Assessment: Faces Pain Score: 4 Faces Pain Scale: Hurts little more Pain Location: head Pain Descriptors / Indicators: Constant;Headache Pain Intervention(s): Monitored during session;Limited activity within patient's tolerance    Home Living Family/patient expects to be discharged to:: Private residence Living Arrangements: Spouse/significant other   Type of Home: House Home Access: Ramped entrance     Home Layout: One level Home Equipment: Grab bars - toilet;Grab bars - tub/shower;Walker - 4 wheels;Bedside commode      Prior Function Level of Independence: Independent with assistive device(s)         Comments: husband does the driving, pt was using a rollator     Hand Dominance   Dominant  Hand: Right    Extremity/Trunk Assessment   Upper Extremity Assessment Upper Extremity Assessment: Defer to OT evaluation    Lower Extremity Assessment Lower Extremity Assessment: RLE deficits/detail;LLE  deficits/detail RLE Deficits / Details: Strength: hip flexion 2/5, knee extension 3/5, ankle dorsiflexion 5/5. Chronic knee pain LLE Deficits / Details: Strength: hip flexion 3+/5, knee extension 5/5, ankle dorsiflexion 3/5 LLE Sensation: decreased light touch LLE Coordination: decreased fine motor;decreased gross motor    Cervical / Trunk Assessment Cervical / Trunk Assessment: Normal  Communication   Communication: Expressive difficulties  Cognition Arousal/Alertness: Awake/alert Behavior During Therapy: WFL for tasks assessed/performed Overall Cognitive Status: Impaired/Different from baseline Area of Impairment: Orientation;Attention;Memory;Following commands;Safety/judgement;Problem solving                 Orientation Level: Disoriented to;Time Current Attention Level: Selective Memory: Decreased short-term memory Following Commands: Follows one step commands with increased time;Follows one step commands inconsistently Safety/Judgement: Decreased awareness of safety   Problem Solving: Slow processing;Difficulty sequencing;Requires verbal cues;Requires tactile cues General Comments: increased time for processing      General Comments General comments (skin integrity, edema, etc.): pt's husband present during session;educated pt and husband on benefits of mobility and use of LUE     Exercises     Assessment/Plan    PT Assessment Patient needs continued PT services  PT Problem List Decreased strength;Decreased activity tolerance;Decreased balance;Decreased mobility;Impaired sensation       PT Treatment Interventions DME instruction;Gait training;Functional mobility training;Therapeutic activities;Therapeutic exercise;Balance training;Patient/family education    PT Goals (Current goals can be found in the Care Plan section)  Acute Rehab PT Goals Patient Stated Goal: to get back to baseline PT Goal Formulation: With patient Time For Goal Achievement:  09/23/19 Potential to Achieve Goals: Good    Frequency Min 4X/week   Barriers to discharge        Co-evaluation               AM-PAC PT "6 Clicks" Mobility  Outcome Measure Help needed turning from your back to your side while in a flat bed without using bedrails?: A Little Help needed moving from lying on your back to sitting on the side of a flat bed without using bedrails?: A Little Help needed moving to and from a bed to a chair (including a wheelchair)?: A Lot Help needed standing up from a chair using your arms (e.g., wheelchair or bedside chair)?: A Lot Help needed to walk in hospital room?: A Lot Help needed climbing 3-5 steps with a railing? : Total 6 Click Score: 13    End of Session Equipment Utilized During Treatment: Gait belt Activity Tolerance: Patient limited by fatigue Patient left: in bed;with bed alarm set;with call bell/phone within reach Nurse Communication: Mobility status PT Visit Diagnosis: Unsteadiness on feet (R26.81);Pain;Other symptoms and signs involving the nervous system (R29.898);Difficulty in walking, not elsewhere classified (R26.2) Pain - Right/Left: Right Pain - part of body: Knee    Time: [id number] PT Time Calculation (min) (ACUTE ONLY): 12 min   Charges:   PT Evaluation $PT Eval Moderate Complexity: 1 Mod          Ellamae Sia, [place], DPT Acute Rehabilitation Services Pager [phone] Office [phone]   Willy Eddy 09/09/2019, 4:33 PM

## 2019-09-09 NOTE — Consult Note (Signed)
Physical Medicine and Rehabilitation Consult   Reason for Consult: Functional deficits due to stroke Referring Physician: Dr. Eliseo Squires   HPI: [name] is a 68 y.o. female with history of COPD,TIAs, fibromyalgia, personality disorder, conversion disorder with seizures, RA;  who was admitted on 09/08/19 with several day history of left sided numbness and weakness progressing to fall and difficulty speaking. MRI brain showed acute infarct in right corona radiata and moderate chronic small vessel disease.  2D echo showed EF 60-65 with  Moderate LVH and no wall abnormality. CTA head/neck negative for LVO. Dr. Erlinda Hong recommended DAPT X 3 weeks followed by ASA alone for stroke due to small vessel disease.  Therapy evaluations done revealing moderately severe cognitive linguistic deficits with severe dysarthria, difficulty following one step commands and deficits in ADLs and mobility. CIR recommended due to functional decline.   Complains of right sided severe knee pain, says there is plan for knee replacement. XR shows evidence of severe tricompartmental OA.    Review of Systems  Unable to perform ROS: Language  Constitutional: Negative for chills and fever.  Respiratory: Negative for cough and shortness of breath.   Gastrointestinal: Positive for heartburn and nausea.       Hiccups  Musculoskeletal: Positive for back pain, joint pain and myalgias.  Neurological: Positive for weakness and headaches.      Past Medical History:  Diagnosis Date   Asthma    Chronic low back pain    Collagen vascular disease ([place])    Conversion disorder with seizures or convulsions    On Phenobarbital.   COPD (chronic obstructive pulmonary disease) (HCC)    Depression    Fibromyalgia    08/18/15  [facility]   GERD (gastroesophageal reflux disease)    History of CVA (cerebrovascular accident)    "69 minni srokes"   Hyperlipidemia    Hypertension    Migraine    Personal history of colonic  polyp - adenoma 03/01/2014   Personality disorder ([place])    Rheumatoid arthritis ([place])    08/18/15  [facility]   Seizures ([place])    last seizure 4 years ago   Sleep apnea    Mild. No need for CPAP.   Stroke [facility])     Past Surgical History:  Procedure Laterality Date   ANKLE SURGERY Left    APPENDECTOMY     BACK SURGERY     BUNIONECTOMY     2 toes   CARDIAC CATHETERIZATION  2008   Normal. Dr Acie Fredrickson.   CHOLECYSTECTOMY     CYST EXCISION     leg   ESOPHAGOGASTRODUODENOSCOPY (EGD) WITH PROPOFOL N/A 06/27/2018   Procedure: ESOPHAGOGASTRODUODENOSCOPY (EGD) WITH PROPOFOL;  Surgeon: Jonathon Bellows, MD;  Location: [facility];  Service: Gastroenterology;  Laterality: N/A;   FLANK MASS EXCISION     FOOT TENDON SURGERY  june 2014   both feet   KNEE SURGERY     Bilateral.   NOSE SURGERY     SHOULDER SURGERY     bil.   TOTAL ABDOMINAL HYSTERECTOMY      Family History  Problem Relation Age of Onset   Cirrhosis Mother    Diabetes Mother    Cirrhosis Father    Breast cancer Sister 53   Breast cancer Sister 54   Colon cancer Neg Hx     Social History:  Married. Used rollater PTA. Per  reports that she has quit smoking. She has never used smokeless tobacco. Per reports that she does  not drink alcohol or use drugs.     Allergies  Allergen Reactions   Amoxicillin     REACTION: hives   Aspirin     REACTION: rash   Codeine Phosphate     REACTION: N/V   Flexeril [Cyclobenzaprine] Nausea And Vomiting    Dizziness    Ibuprofen Hives   Penicillins     REACTION: hives   Tetanus Toxoids Swelling    Medications Prior to Admission  Medication Sig Dispense Refill   Adalimumab 40 MG/0.4ML PNKT Inject 1 pen into the skin every 7 (seven) days.      albuterol (PROVENTIL HFA;VENTOLIN HFA) 108 (90 Base) MCG/ACT inhaler INHALE 2 PUFFS INTO THE LUNGS EVERY 6 HOURS AS NEEDED FOR WHEEZING ORSHORTNESS OF BREATH. (Patient taking differently: Inhale 2 puffs into the  lungs every 6 (six) hours as needed for wheezing or shortness of breath. ) 18 g 3   alendronate (FOSAMAX) 70 MG tablet Take 70 mg by mouth once a week. Fridays     carvedilol (COREG) 3.125 MG tablet TAKE 1 TABLET BY MOUTH TWICE DAILY WITH A MEAL (Patient taking differently: Take 3.125 mg by mouth 2 (two) times daily with a meal. ) 180 tablet 1   citalopram (CELEXA) 20 MG tablet TAKE 1 TABLET BY MOUTH ONCE DAILY (Patient taking differently: Take 20 mg by mouth at bedtime. TAKE 1 TABLET BY MOUTH ONCE DAILY) 30 tablet 2   clotrimazole (LOTRIMIN) 1 % cream Apply 1 application topically 2 (two) times daily. Under breasts 30 g 1   folic acid (FOLVITE) 1 MG tablet Take 1 mg by mouth.     gabapentin (NEURONTIN) 300 MG capsule TAKE 3 CAPSULES BY MOUTH TWICE DAILY (Patient taking differently: Take 900 mg by mouth 2 (two) times daily. ) 180 capsule 0   HYDROcodone-acetaminophen (NORCO) 10-325 MG tablet Take 1-2 tablets by mouth See admin instructions. 1 tablet in the morning and 2 tablets at night     omeprazole (PRILOSEC) 20 MG capsule TAKE 1 CAPSULE BY MOUTH ONCE DAILY (Patient taking differently: Take 20 mg by mouth daily. ) 30 capsule 1   PHENObarbital (LUMINAL) 100 MG tablet Take 1 tablet (100 mg total) by mouth daily. 90 tablet 1   tiotropium (SPIRIVA HANDIHALER) 18 MCG inhalation capsule INHALE THE CONTENTS OF 1 CAPSULE VIA HANDIHALER BY MOUTH ONCE DAILY (Patient taking differently: Place 18 mcg into inhaler and inhale daily. ) 30 capsule 3   topiramate (TOPAMAX) 50 MG tablet TAKE 1 TABLET BY MOUTH EVERY MORNING AND2 TABS BY MOUTH AT BEDTIME DAILY (Patient taking differently: Take 50 mg by mouth See admin instructions. 50mg  in the mornings and 100mg  at bedtime) 90 tablet 11   triamcinolone ointment (KENALOG) 0.5 % APPLY TO AFFECTED AREAS TWICE DAILY AS DIRECTED (Patient taking differently: Apply 1 application topically 2 (two) times daily. ) 60 g 0   dicyclomine (BENTYL) 20 MG tablet Take 1  tablet (20 mg total) by mouth 3 (three) times daily as needed for spasms. (Patient not taking: Reported on 09/08/2019) 20 tablet 0   ferrous sulfate 325 (65 FE) MG tablet Take 1 tablet (325 mg total) by mouth daily with breakfast. (Patient not taking: Reported on 09/08/2019) 30 tablet 2   HYDROcodone-acetaminophen (NORCO/VICODIN) 5-325 MG tablet Take 1 tablet by mouth 3 (three) times daily as needed for moderate pain. (Patient not taking: Reported on 09/08/2019) 90 tablet 0   methocarbamol (ROBAXIN) 500 MG tablet Take 1 tablet (500 mg total) by mouth every 8 (  eight) hours as needed for muscle spasms. (Patient not taking: Reported on 09/08/2019) 60 tablet 1   NEOMYCIN-POLYMYXIN-HYDROCORTISONE (CORTISPORIN) 1 % SOLN OTIC solution Place 3 drops into both ears 4 (four) times daily. (Patient not taking: Reported on 09/08/2019) 10 mL 0   nortriptyline (PAMELOR) 10 MG capsule Start taking 1 capsule at bedtime x 1 week, then increase to 2 capsules at bedtime (Patient not taking: Reported on 09/08/2019) 60 capsule 3   ondansetron (ZOFRAN ODT) 4 MG disintegrating tablet Take 1 tablet (4 mg total) by mouth every 8 (eight) hours as needed for nausea or vomiting. (Patient not taking: Reported on 09/08/2019) 20 tablet 0    Home: Home Living Family/patient expects to be discharged to:: Private residence Living Arrangements: Spouse/significant other Type of Home: House Home Access: [place]: One level Bathroom Shower/Tub: Multimedia programmer: Handicapped height Bathroom Accessibility: No Home Equipment: Grab bars - toilet, Grab bars - tub/shower, Environmental consultant - 4 wheels, Bedside commode  Lives With: Spouse  Functional History: Prior Function Level of Independence: Independent with assistive device(s) Comments: husband does the driving, pt was using a rollator Functional Status:  Mobility: Bed Mobility Overal bed mobility: Needs Assistance Bed Mobility: Sit to Supine, Supine  to Sit Rolling: Mod assist Sidelying to sit: Mod assist Supine to sit: Min guard Sit to supine: Mod assist General bed mobility comments: pt able to progress to edge of bed with min guard assist and increased time, modA for BLE management back into bed Transfers Overall transfer level: Needs assistance Equipment used: Rolling walker (2 wheeled) Transfers: Sit to/from Stand Sit to Stand: Mod assist Stand pivot transfers: Mod assist, Min assist General transfer comment: pt requiring modA to power up to standing, increased trunk flexion, cues for placing both hands on walker. Able to take side steps up to head of bed with increased time      ADL: ADL Overall ADL's : Needs assistance/impaired Eating/Feeding: Set up, Sitting Grooming: Minimal assistance, Sitting, Moderate assistance Upper Body Bathing: Moderate assistance, Sitting Lower Body Bathing: Maximal assistance, Sit to/from stand Upper Body Dressing : Moderate assistance, Sitting Lower Body Dressing: Maximal assistance, Sit to/from stand Lower Body Dressing Details (indicate cue type and reason): maxA to access BLE Toilet Transfer: Minimal assistance, Moderate assistance, Stand-pivot Toilet Transfer Details (indicate cue type and reason): simulated stand pivot;modA for stability and vc  Toileting- Clothing Manipulation and Hygiene: Moderate assistance, Sit to/from stand Functional mobility during ADLs: Minimal assistance, Moderate assistance, Rolling walker General ADL Comments: limited by cognition, decreased activity tolerance, L sided weakness and numbness  Cognition: Cognition Overall Cognitive Status: Impaired/Different from baseline Arousal/Alertness: Awake/alert Orientation Level: Oriented X4 Memory: Impaired(pt with decreased sustained attention that does not allow adequate recall of information) Awareness: Appears intact(intellectual awareness to speech deficits) Problem Solving: Impaired Problem Solving  Impairment: Functional basic(needed max visual/verbal cues to locate call bell in bed- also squinting her right eye) Behaviors: Restless(pt is jittery, frequently moving legs in bed during session) Cognition Arousal/Alertness: Awake/alert Behavior During Therapy: WFL for tasks assessed/performed Overall Cognitive Status: Impaired/Different from baseline Area of Impairment: Orientation, Attention, Memory, Following commands, Safety/judgement, Problem solving Orientation Level: Disoriented to, Time Current Attention Level: Selective Memory: Decreased short-term memory Following Commands: Follows one step commands with increased time, Follows one step commands inconsistently Safety/Judgement: Decreased awareness of safety Problem Solving: Slow processing, Difficulty sequencing, Requires verbal cues, Requires tactile cues General Comments: increased time for processing   Blood pressure (!) 141/82, pulse 77, temperature 98.3 F (  36.8 C), temperature source Oral, resp. rate 18, weight 95.5 kg, SpO2 99 %.   Physical Exam  Exam was limited due to patient's tearful disposition, headache and knee pain, and nausea. Nursing note and vitals reviewed. Constitutional: She appears well-developed and well-nourished. She appears distressed. Tearful when asked orientation questions saying "I didn't know I had to do this."  GI: She exhibits no distension. There is no abdominal tenderness.  Genitourinary:    Genitourinary Comments: Concentrated urine via puwick.    Musculoskeletal:     Comments: Right knee tender to palpation and severe pain with attempts at ROM.  Left knee is also tender to light palpation but with less pain during movement as compared to right. Both legs feel warm throughout.  Neurological: She is alert.  Soft voice with dysarthria--oriented to self and situation--limited as tearful and distraught about her situation as well as severe pain right knee. Able to follow simple motor commands  with encouragement.    Skin: Skin is warm and dry.    No results found for this or any previous visit (from the past 24 hour(s)). Ct Angio Head W Or Wo Contrast  Result Date: 09/08/2019 CLINICAL DATA:  68 year old female with acute lacunar infarct of the right corona radiata and posterior lentiform on MRI today. EXAM: CT ANGIOGRAPHY HEAD AND NECK TECHNIQUE: Multidetector CT imaging of the head and neck was performed using the standard protocol during bolus administration of intravenous contrast. Multiplanar CT image reconstructions and MIPs were obtained to evaluate the vascular anatomy. Carotid stenosis measurements (when applicable) are obtained utilizing NASCET criteria, using the distal internal carotid diameter as the denominator. CONTRAST:  2mL OMNIPAQUE IOHEXOL 350 MG/ML SOLN COMPARISON:  Brain MRI earlier today. Head CT 1520 hours today. FINDINGS: CTA NECK Skeleton: Absent dentition. No acute osseous abnormality identified. Upper chest: Negative. Other neck: Negative. Aortic arch: 3 vessel arch configuration. Distal arch soft and calcified plaque. Right carotid system: Tortuous brachiocephalic artery and proximal right CCA without stenosis. Minimal plaque proximal to the bifurcation. Mild calcified plaque at the bifurcation without stenosis. Tortuous cervical right ICA. Left carotid system: Tortuous proximal left CCA without stenosis. Mild calcified plaque at the posterior left ICA origin without stenosis. Tortuous cervical left ICA. Vertebral arteries: Soft plaque at the right subclavian artery origin without stenosis. Normal right vertebral artery origin. Tortuous right V1 segment. Diminutive but patent right vertebral artery to the skull base. Soft and calcified plaque in the proximal left subclavian artery without significant stenosis. Normal left vertebral artery origin. Tortuous left V1 segment. The left vertebral is also somewhat diminutive but patent to the skull base without stenosis. CTA  HEAD Posterior circulation: Codominant distal vertebral arteries without stenosis. The right is diminutive beyond the PICA origin. Normal left PICA origin. Patent vertebrobasilar junction. Tortuous and somewhat diminutive basilar artery with no stenosis. The basilar functionally terminates in patent SCA origins with fetal type bilateral PCA is. Bilateral PCA branches are within normal limits. Anterior circulation: Both ICA siphons are patent. On the left there is moderate calcified plaque, but ectasia of the left siphon without stenosis. Normal left posterior communicating artery origin. On the right there is mild to moderate calcified plaque and right ICA ectasia without stenosis. Normal right posterior communicating artery origin. Patent, tortuous carotid termini. Normal MCA and ACA origins. Anterior communicating artery and bilateral ACA branches are normal. Left MCA M1 segment and bifurcation are patent without stenosis. Left MCA branches are within normal limits. Right MCA M1 segment bifurcates early without  stenosis. Right MCA branches are within normal limits. Venous sinuses: Early contrast timing, not well evaluated. Anatomic variants: Diminutive vertebrobasilar system in the setting of fetal type PCA origins. Review of the MIP images confirms the above findings IMPRESSION: 1. Negative for large vessel occlusion 2. 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 3. Diminutive vertebrobasilar system in the setting of fetal PCA origins (normal variant). 4. Mild atherosclerosis of the visible aorta. Electronically Signed   By: Genevie Ann M.D.   On: 09/08/2019 22:14   Dg Pelvis 1-2 Views  Result Date: 09/08/2019 CLINICAL DATA:  Fall with hip pain EXAM: PELVIS - 1-2 VIEW COMPARISON:  CT 06/10/2018 FINDINGS: SI joints are non widened. The pubic symphysis and rami appear intact. No fracture or malalignment.  Right femoral neck is partially obscured by the trochanter IMPRESSION: No definite acute osseous abnormality Electronically Signed   By: Donavan Foil M.D.   On: 09/08/2019 19:56   Ct Head Wo Contrast  Result Date: 09/08/2019 CLINICAL DATA:  Speech difficulty EXAM: CT HEAD WITHOUT CONTRAST TECHNIQUE: Contiguous axial images were obtained from the base of the skull through the vertex without intravenous contrast. COMPARISON:  September 11, 2017 FINDINGS: Brain: No evidence of acute territorial infarction, hemorrhage, hydrocephalus,extra-axial collection or mass lesion/mass effect. There is dilatation the ventricles and sulci consistent with age-related atrophy. Low-attenuation changes in the deep white matter consistent with small vessel ischemia. Again noted is a lacunar infarct within the right basal ganglia. Vascular: No hyperdense vessel or unexpected calcification. Skull: The skull is intact. No fracture or focal lesion identified. Sinuses/Orbits: The visualized paranasal sinuses and mastoid air cells are clear. The orbits and globes intact. Other: None IMPRESSION: No acute intracranial abnormality. Findings consistent with age related atrophy and chronic small vessel ischemia Lacunar infarct in the right basal ganglia. Electronically Signed   By: Prudencio Pair M.D.   On: 09/08/2019 15:36   Ct Angio Neck W Or Wo Contrast  Result Date: 09/08/2019 CLINICAL DATA:  68 year old female with acute lacunar infarct of the right corona radiata and posterior lentiform on MRI today. EXAM: CT ANGIOGRAPHY HEAD AND NECK TECHNIQUE: Multidetector CT imaging of the head and neck was performed using the standard protocol during bolus administration of intravenous contrast. Multiplanar CT image reconstructions and MIPs were obtained to evaluate the vascular anatomy. Carotid stenosis measurements (when applicable) are obtained utilizing NASCET criteria, using the distal internal carotid diameter as the denominator. CONTRAST:   65mL OMNIPAQUE IOHEXOL 350 MG/ML SOLN COMPARISON:  Brain MRI earlier today. Head CT 1520 hours today. FINDINGS: CTA NECK Skeleton: Absent dentition. No acute osseous abnormality identified. Upper chest: Negative. Other neck: Negative. Aortic arch: 3 vessel arch configuration. Distal arch soft and calcified plaque. Right carotid system: Tortuous brachiocephalic artery and proximal right CCA without stenosis. Minimal plaque proximal to the bifurcation. Mild calcified plaque at the bifurcation without stenosis. Tortuous cervical right ICA. Left carotid system: Tortuous proximal left CCA without stenosis. Mild calcified plaque at the posterior left ICA origin without stenosis. Tortuous cervical left ICA. Vertebral arteries: Soft plaque at the right subclavian artery origin without stenosis. Normal right vertebral artery origin. Tortuous right V1 segment. Diminutive but patent right vertebral artery to the skull base. Soft and calcified plaque in the proximal left subclavian artery without significant stenosis. Normal left vertebral artery origin. Tortuous left V1 segment. The left vertebral is also somewhat diminutive but patent to the skull base without  stenosis. CTA HEAD Posterior circulation: Codominant distal vertebral arteries without stenosis. The right is diminutive beyond the PICA origin. Normal left PICA origin. Patent vertebrobasilar junction. Tortuous and somewhat diminutive basilar artery with no stenosis. The basilar functionally terminates in patent SCA origins with fetal type bilateral PCA is. Bilateral PCA branches are within normal limits. Anterior circulation: Both ICA siphons are patent. On the left there is moderate calcified plaque, but ectasia of the left siphon without stenosis. Normal left posterior communicating artery origin. On the right there is mild to moderate calcified plaque and right ICA ectasia without stenosis. Normal right posterior communicating artery origin. Patent, tortuous  carotid termini. Normal MCA and ACA origins. Anterior communicating artery and bilateral ACA branches are normal. Left MCA M1 segment and bifurcation are patent without stenosis. Left MCA branches are within normal limits. Right MCA M1 segment bifurcates early without stenosis. Right MCA branches are within normal limits. Venous sinuses: Early contrast timing, not well evaluated. Anatomic variants: Diminutive vertebrobasilar system in the setting of fetal type PCA origins. Review of the MIP images confirms the above findings IMPRESSION: 1. Negative for large vessel occlusion 2. 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 3. Diminutive vertebrobasilar system in the setting of fetal PCA origins (normal variant). 4. Mild atherosclerosis of the visible aorta. Electronically Signed   By: Genevie Ann M.D.   On: 09/08/2019 22:14   Mr Brain Wo Contrast  Result Date: 09/08/2019 CLINICAL DATA:  Focal neuro deficit, greater than 6 hours, stroke suspected. Additional history provided: Patient arrives via EMS with numbness of left face, left hand and left leg, blurred vision in right eye. EXAM: MRI HEAD WITHOUT CONTRAST TECHNIQUE: Multiplanar, multiecho pulse sequences of the brain and surrounding structures were obtained without intravenous contrast. COMPARISON:  Head CT 09/08/2019. FINDINGS: Brain: The diffusion-weighted sequences are of good quality. However, there is intermittent moderate motion degradation of multiple sequences, limiting evaluation. There is an acute lacunar infarct measuring 1.1 x 0.8 x 1.8 cm (AP x TV x CC) within the right corona radiata, extending inferiorly within the right internal capsule/basal ganglia. Corresponding T2/FLAIR hyperintensity at this site. There is a background of moderate chronic small vessel ischemic disease. Prominent perivascular space versus chronic lacunar infarct within  the inferior right basal ganglia. No evidence of intracranial mass. No midline shift or extra-axial fluid collection. No chronic intracranial blood products are identified. Cerebral volume is normal for age. Vascular: Flow voids maintained within the proximal large arterial vessels. Skull and upper cervical spine: No focal marrow lesion identified on motion degraded imaging. Sinuses/Orbits: Visualized orbits demonstrate no acute abnormality. Mild ethmoid sinus mucosal thickening. No significant mastoid effusion. These results were called by telephone at the time of interpretation on 09/08/2019 at 7:35 pm to provider Dr. Joselyn Glassman, who verbally acknowledged these results. IMPRESSION: 1. Intermittently motion degraded examination as described. 2. 1.1 x 0.8 x 1.8 cm (AP x TV x CC) acute infarct within the right corona radiata, extending inferiorly within the right internal capsule/basal ganglia. 3. There is a background of moderate chronic small vessel ischemic disease. Electronically Signed   By: Kellie Simmering DO   On: 09/08/2019 19:35   Dg Knee Complete 4 Views Right  Result Date: 09/08/2019 CLINICAL DATA:  Fall, hip and knee pain EXAM: RIGHT KNEE - COMPLETE 4+ VIEW COMPARISON:  None. FINDINGS: Advanced degenerative changes within all 3 compartments of the right knee with joint space narrowing  and spurring. Small joint effusion. No acute bony abnormality. Specifically, no fracture, subluxation, or dislocation. IMPRESSION: Advanced tricompartment degenerative changes. Small joint effusion. No acute bony abnormality. Electronically Signed   By: Rolm Baptise M.D.   On: 09/08/2019 19:55   Dg Femur Min 2 Views Right  Result Date: 09/08/2019 CLINICAL DATA:  Fall, hip pain EXAM: RIGHT FEMUR 2 VIEWS COMPARISON:  None. FINDINGS: Advanced arthritis in the right knee. Joint space narrowing and spurring. Small joint effusion. No acute bony abnormality. Specifically, no fracture, subluxation, or dislocation. IMPRESSION: No  acute bony abnormality. Advanced degenerative changes in the right knee with small joint effusion. Electronically Signed   By: Rolm Baptise M.D.   On: 09/08/2019 19:55     Assessment/Plan: Diagnosis: Impaired gait and mobility secondary to right corona radiata infarct.  1. Does the need for close, 24 hr/day medical supervision in concert with the patient's rehab needs make it unreasonable for this patient to be served in a less intensive setting? Yes 2. Co-Morbidities requiring supervision/potential complications: COPD, TIAs, fibromyalgia, personality disorder, conversion disorder with seizures, RA 3. Due to bladder management, bowel management, safety, skin/wound care, disease management, medication administration, pain management and patient education, does the patient require 24 hr/day rehab nursing? Yes 4. Does the patient require coordinated care of a physician, rehab nurse, therapy disciplines of PT, OT, SLP to address physical and functional deficits in the context of the above medical diagnosis(es)? Yes Addressing deficits in the following areas: balance, endurance, locomotion, strength, transferring, bowel/bladder control, bathing, dressing, feeding, grooming, toileting, cognition, speech, language and psychosocial support 5. Can the patient actively participate in an intensive therapy program of at least 3 hrs of therapy per day at least 5 days per week? Yes 6. The potential for patient to make measurable gains while on inpatient rehab is fair 7. Anticipated functional outcomes upon discharge from inpatient rehab are modified independent  with PT, modified independent with OT, modified independent with SLP. 8. Estimated rehab length of stay to reach the above functional goals is: 10-14 days 9. Anticipated discharge destination: Home 10. Overall Rehab/Functional Prognosis: fair  RECOMMENDATIONS: This patient's condition is appropriate for continued rehabilitative care in the following  setting: CIR Patient has agreed to participate in recommended program. Yes Note that insurance prior authorization may be required for reimbursement for recommended care.  Comment: Mrs. Craigie currently has severe right sided knee pain--she would benefit receiving her daily Norco ordered earlier in the day to treat pain earlier and prevent pain-related nausea she is experiencing now. During therapy she may benefit from a knee brace to assist in proprioception and prevent buckling.  Bary Leriche, PA-C   I have personally performed a face to face diagnostic evaluation, including, but not limited to relevant history and physical exam findings, of this patient and developed relevant assessment and plan.  Additionally, I have reviewed and concur with the physician assistant's documentation above.  Leeroy Cha, MD 09/10/2019

## 2019-09-09 NOTE — Progress Notes (Signed)
  Echocardiogram 2D Echocardiogram has been performed.  [name] 09/09/2019, 9:36 AM

## 2019-09-09 NOTE — Evaluation (Signed)
Speech Language Pathology Evaluation Patient Details Name: [name] MRN: [id number] DOB: 05-02-1951 Today's Date: 09/09/2019 Time: [id number] SLP Time Calculation (min) (ACUTE ONLY): 20 min  Problem List:  Patient Active Problem List   Diagnosis Date Noted  . CVA (cerebral vascular accident) ([place]) 09/09/2019  . Acute ischemic stroke ([place]) 09/08/2019  . Ear pain, bilateral 03/30/2019  . Coronary artery disease 10/16/2018  . Nasal congestion 08/15/2017  . Hypersomnia with sleep apnea 04/18/2017  . Snoring 04/18/2017  . Post traumatic stress disorder (PTSD) 04/18/2017  . COPD mixed type ([place]) 04/18/2017  . Somnolence, daytime 02/26/2017  . Cervical paraspinal muscle spasm 10/26/2016  . Lower back pain 05/27/2016  . Left hip pain 05/27/2016  . Fibromyalgia 08/18/2015  . Rheumatoid arthritis ([place]) 03/09/2015  . Essential hypertension, benign 02/18/2015  . Right knee pain 02/18/2015  . Cervical disc disorder with radiculopathy of cervical region 01/21/2015  . Bilateral arm pain 01/13/2015  . After cataract 07/22/2014  . Personal history of colonic polyp - adenoma 03/01/2014  . [place] intertrigo 12/29/2013  . Dyshidrotic eczema 12/29/2013  . Routine adult health maintenance 12/29/2013  . 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 Frequent falls 02/10/2013  . Generalized pain 08/30/2012  . Neck pain 02/21/2012  . STROKE 07/29/2008  . Seizures ([place]) 07/29/2008  . Migraine without aura 07/29/2007  . DEPENDENCE, BARB/SED, CONTINUOUS 06/24/2007  . HYPERLIPIDEMIA 12/19/2006  . OBESITY, NOS 12/19/2006  . Depression with anxiety 12/19/2006  . PANIC ATTACKS 12/19/2006  . 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 BORDERLINE PERSONALITY 12/19/2006  . COPD 12/19/2006  . REFLUX ESOPHAGITIS 12/19/2006   Past Medical History:  Past Medical History:  Diagnosis Date  . Asthma   . Chronic low back pain   . Collagen vascular disease ([place])   . 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  . COPD (chronic obstructive pulmonary disease) ([place])   . Depression   . Fibromyalgia    08/18/15  [facility]  . GERD (gastroesophageal reflux disease)   . History of CVA (cerebrovascular accident)    "73 minni srokes"  . Hyperlipidemia   . Hypertension   . Migraine   . Personal history of colonic polyp - adenoma 03/01/2014  . Personality disorder ([place])   . Rheumatoid arthritis ([place])    08/18/15  [facility]  . Seizures ([place])    last seizure 4 years ago  . Sleep apnea    Mild. No need for CPAP.  Marland Kitchen Stroke [facility])    Past Surgical History:  Past Surgical History:  Procedure Laterality Date  . ANKLE SURGERY Left   . APPENDECTOMY    . BACK SURGERY    . BUNIONECTOMY     2 toes  . CARDIAC CATHETERIZATION  2008   Normal. Dr Acie Fredrickson.  . CHOLECYSTECTOMY    . CYST EXCISION     leg  . ESOPHAGOGASTRODUODENOSCOPY (EGD) WITH PROPOFOL N/A 06/27/2018   Procedure: ESOPHAGOGASTRODUODENOSCOPY (EGD) WITH PROPOFOL;  Surgeon: Jonathon Bellows, MD;  Location: [facility] ENDOSCOPY;  Service: Gastroenterology;  Laterality: N/A;  . FLANK MASS EXCISION    . FOOT TENDON SURGERY  june 2014   both feet  . KNEE SURGERY     Bilateral.  . NOSE SURGERY    . SHOULDER SURGERY     bil.  Marland Kitchen TOTAL ABDOMINAL HYSTERECTOMY     HPI:  68 yo female adm to [facility] with left face, left hand and left leg numbness and  blurred vision in right eye.  Pt  found to have an acute right basal ganglia and corona radiata CVA.  She passed a yale swallow screen.  Pt has h/o "miniature strokes".  Speech evaluation ordered.  In 2018 she was found to have a cyst below the basal ganglia.  Pt with PMH + depression, COPD.   Assessment / Plan / Recommendation Clinical Impression  Limited assessment done due to pt's complaint of headache that intensified during evaluation - SLP had pt call to request pain medication with total assist.  At this time, pt is demonstrating moderately severe cognitive linguistic deficits and severe  dysarthria with possible motor planning difficulties.  She is oriented to self independently, place with multiple choice cue and diagnosis with context cue.  Speech intelligibilty is impaired due to lingual weakness/disoordination and facial nerve deficits.  Initial sound repetition noted - when pt is unable to verbalize word with her awareness/frustration.    She also inconsistently follows one step directions likley exacerbated by pain and poor sustained attention but also question baseline function given multiple co-morbidities including borderline personality d/o, conversion disorder.  Pt will benefit from skilled SLP for cognitve linguistic treatment to decrease caregiver burden.  Reviwed initial goals with pt and findings of exam and she was agreeable to plan.    SLP Assessment  SLP Recommendation/Assessment: Patient needs continued Speech Lanaguage Pathology Services SLP Visit Diagnosis: Dysarthria and anarthria (R47.1);Cognitive communication deficit (R41.841)    Follow Up Recommendations  Skilled Nursing facility    Frequency and Duration min 1 x/week  1 week      SLP Evaluation Cognition  Arousal/Alertness: Awake/alert Orientation Level: Oriented to person;Oriented to place(with cue pt oriented to location) Memory: Impaired(pt with decreased sustained attention that does not allow adequate recall of information) Awareness: Appears intact(intellectual awareness to speech deficits) Problem Solving: Impaired Problem Solving Impairment: Functional basic(needed max visual/verbal cues to locate call bell in bed- also squinting her right eye) Behaviors: Restless(pt is jittery, frequently moving legs in bed during session)       Comprehension  Auditory Comprehension Yes/No Questions: Not tested Commands: Impaired One Step Basic Commands: 50-74% accurate Other Conversation Comments: pt is inconsistently following directions Interfering Components: Pain;Attention EffectiveTechniques:  Repetition;Slowed speech Reading Comprehension Reading Status: Not tested    Expression Expression Primary Mode of Expression: Verbal Verbal Expression Overall Verbal Expression: Impaired Initiation: Impaired Automatic Speech: Counting(counting 1-10 with moderate verbal cues from 8 forward) Repetition: Impaired Level of Impairment: Word level Naming: Not tested Interfering Components: Attention;Speech intelligibility Non-Verbal Means of Communication: Gestures;Other (comment)(pt pointing x1 during session when unable to verbalize) Written Expression Dominant Hand: Right Written Expression: Not tested   Oral / Motor  Oral Motor/Sensory Function Overall Oral Motor/Sensory Function: Moderate impairment Facial ROM: Reduced left;Suspected CN VII (facial) dysfunction Facial Symmetry: Abnormal symmetry left;Suspected CN VII (facial) dysfunction Facial Strength: Reduced left;Suspected CN VII (facial) dysfunction Facial Sensation: Reduced left;Suspected CN V (Trigeminal) dysfunction Lingual ROM: Suspected CN XII (hypoglossal) dysfunction Lingual Symmetry: Suspected CN XII (hypoglossal) dysfunction Lingual Strength: Reduced;Suspected CN XII (hypoglossal) dysfunction Lingual Sensation: (dnt) Velum: (pt did not open oral cavity adequately to assess) Mandible: (dnt) Motor Speech Overall Motor Speech: Impaired Respiration: Impaired Phonation: Low vocal intensity Articulation: Impaired Level of Impairment: Word Intelligibility: Intelligibility reduced Word: 25-49% accurate Phrase: 50-74% accurate Sentence: Not tested Conversation: Not tested Motor Planning: Impaired Level of Impairment: Word Motor Speech Errors: Groping for words;Aware   GO  Macario Golds 09/09/2019, 8:51 AM  Luanna Salk, MS [facility] SLP Acute Rehab Services Pager [phone] Office [phone]

## 2019-09-09 NOTE — Progress Notes (Signed)
STROKE TEAM PROGRESS NOTE   INTERVAL HISTORY Husband at bedside. Pt still has slurry speech and left hemiparesthesia. MRI showed right BG/CR infarct. PT OT pending.   Vitals:   09/09/19 0312 09/09/19 0613 09/09/19 0745 09/09/19 1127  BP: (!) 154/95 (!) 141/83 (!) 162/90 (!) 171/97  Pulse: 88 91 89 79  Resp: (!) 22 18 19 18   Temp: 98.4 F (36.9 C) 98.3 F (36.8 C) 98.5 F (36.9 C) 99.2 F (37.3 C)  TempSrc: Oral Oral Oral Oral  SpO2: 98% 99% 94% 97%  Weight: 95.5 kg       CBC:  Recent Labs  Lab 09/08/19 1430 09/08/19 1449  WBC 7.9  --   NEUTROABS 4.9  --   HGB 11.3* 11.6*  HCT 36.8 34.0*  MCV 83.4  --   PLT 212  --     Basic Metabolic Panel:  Recent Labs  Lab 09/08/19 1440 09/08/19 1449  NA 134* 138  K 4.0 4.0  CL 104 108  CO2 17*  --   GLUCOSE 96 99  BUN 6* 6*  CREATININE 0.61 0.50  CALCIUM 8.4*  --    Lipid Panel:     Component Value Date/Time   CHOL 206 (H) 09/09/2019 0408   CHOL 189 10/16/2018 1028   TRIG 89 09/09/2019 0408   HDL 49 09/09/2019 0408   HDL 59 10/16/2018 1028   CHOLHDL 4.2 09/09/2019 0408   VLDL 18 09/09/2019 0408   LDLCALC 139 (H) 09/09/2019 0408   LDLCALC 101 (H) 10/16/2018 1028   HgbA1c:  Lab Results  Component Value Date   HGBA1C 5.7 (H) 09/09/2019   Urine Drug Screen: No results found for: LABOPIA, COCAINSCRNUR, LABBENZ, AMPHETMU, THCU, LABBARB  Alcohol Level No results found for: ETH  IMAGING Ct Angio Head W Or Wo Contrast  Result Date: 09/08/2019 CLINICAL DATA:  68 year old female with acute lacunar infarct of the right corona radiata and posterior lentiform on MRI today. EXAM: CT ANGIOGRAPHY HEAD AND NECK TECHNIQUE: Multidetector CT imaging of the head and neck was performed using the standard protocol during bolus administration of intravenous contrast. Multiplanar CT image reconstructions and MIPs were obtained to evaluate the vascular anatomy. Carotid stenosis measurements (when applicable) are obtained utilizing  NASCET criteria, using the distal internal carotid diameter as the denominator. CONTRAST:  26mL OMNIPAQUE IOHEXOL 350 MG/ML SOLN COMPARISON:  Brain MRI earlier today. Head CT 1520 hours today. FINDINGS: CTA NECK Skeleton: Absent dentition. No acute osseous abnormality identified. Upper chest: Negative. Other neck: Negative. Aortic arch: 3 vessel arch configuration. Distal arch soft and calcified plaque. Right carotid system: Tortuous brachiocephalic artery and proximal right CCA without stenosis. Minimal plaque proximal to the bifurcation. Mild calcified plaque at the bifurcation without stenosis. Tortuous cervical right ICA. Left carotid system: Tortuous proximal left CCA without stenosis. Mild calcified plaque at the posterior left ICA origin without stenosis. Tortuous cervical left ICA. Vertebral arteries: Soft plaque at the right subclavian artery origin without stenosis. Normal right vertebral artery origin. Tortuous right V1 segment. Diminutive but patent right vertebral artery to the skull base. Soft and calcified plaque in the proximal left subclavian artery without significant stenosis. Normal left vertebral artery origin. Tortuous left V1 segment. The left vertebral is also somewhat diminutive but patent to the skull base without stenosis. CTA HEAD Posterior circulation: Codominant distal vertebral arteries without stenosis. The right is diminutive beyond the PICA origin. Normal left PICA origin. Patent vertebrobasilar junction. Tortuous and somewhat diminutive basilar artery with no stenosis.  The basilar functionally terminates in patent SCA origins with fetal type bilateral PCA is. Bilateral PCA branches are within normal limits. Anterior circulation: Both ICA siphons are patent. On the left there is moderate calcified plaque, but ectasia of the left siphon without stenosis. Normal left posterior communicating artery origin. On the right there is mild to moderate calcified plaque and right ICA ectasia  without stenosis. Normal right posterior communicating artery origin. Patent, tortuous carotid termini. Normal MCA and ACA origins. Anterior communicating artery and bilateral ACA branches are normal. Left MCA M1 segment and bifurcation are patent without stenosis. Left MCA branches are within normal limits. Right MCA M1 segment bifurcates early without stenosis. Right MCA branches are within normal limits. Venous sinuses: Early contrast timing, not well evaluated. Anatomic variants: Diminutive vertebrobasilar system in the setting of fetal type PCA origins. Review of the MIP images confirms the above findings IMPRESSION: 1. Negative for large vessel occlusion 2. 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 3. Diminutive vertebrobasilar system in the setting of fetal PCA origins (normal variant). 4. Mild atherosclerosis of the visible aorta. Electronically Signed   By: Genevie Ann M.D.   On: 09/08/2019 22:14   Dg Pelvis 1-2 Views  Result Date: 09/08/2019 CLINICAL DATA:  Fall with hip pain EXAM: PELVIS - 1-2 VIEW COMPARISON:  CT 06/10/2018 FINDINGS: SI joints are non widened. The pubic symphysis and rami appear intact. No fracture or malalignment. Right femoral neck is partially obscured by the trochanter IMPRESSION: No definite acute osseous abnormality Electronically Signed   By: Donavan Foil M.D.   On: 09/08/2019 19:56   Ct Head Wo Contrast  Result Date: 09/08/2019 CLINICAL DATA:  Speech difficulty EXAM: CT HEAD WITHOUT CONTRAST TECHNIQUE: Contiguous axial images were obtained from the base of the skull through the vertex without intravenous contrast. COMPARISON:  September 11, 2017 FINDINGS: Brain: No evidence of acute territorial infarction, hemorrhage, hydrocephalus,extra-axial collection or mass lesion/mass effect. There is dilatation the ventricles and sulci consistent with age-related atrophy.  Low-attenuation changes in the deep white matter consistent with small vessel ischemia. Again noted is a lacunar infarct within the right basal ganglia. Vascular: No hyperdense vessel or unexpected calcification. Skull: The skull is intact. No fracture or focal lesion identified. Sinuses/Orbits: The visualized paranasal sinuses and mastoid air cells are clear. The orbits and globes intact. Other: None IMPRESSION: No acute intracranial abnormality. Findings consistent with age related atrophy and chronic small vessel ischemia Lacunar infarct in the right basal ganglia. Electronically Signed   By: Prudencio Pair M.D.   On: 09/08/2019 15:36   Ct Angio Neck W Or Wo Contrast  Result Date: 09/08/2019 CLINICAL DATA:  68 year old female with acute lacunar infarct of the right corona radiata and posterior lentiform on MRI today. EXAM: CT ANGIOGRAPHY HEAD AND NECK TECHNIQUE: Multidetector CT imaging of the head and neck was performed using the standard protocol during bolus administration of intravenous contrast. Multiplanar CT image reconstructions and MIPs were obtained to evaluate the vascular anatomy. Carotid stenosis measurements (when applicable) are obtained utilizing NASCET criteria, using the distal internal carotid diameter as the denominator. CONTRAST:  6mL OMNIPAQUE IOHEXOL 350 MG/ML SOLN COMPARISON:  Brain MRI earlier today. Head CT 1520 hours today. FINDINGS: CTA NECK Skeleton: Absent dentition. No acute osseous abnormality identified. Upper chest: Negative. Other neck: Negative. Aortic arch: 3 vessel arch configuration. Distal arch soft and calcified plaque. Right carotid system: Tortuous brachiocephalic artery and  proximal right CCA without stenosis. Minimal plaque proximal to the bifurcation. Mild calcified plaque at the bifurcation without stenosis. Tortuous cervical right ICA. Left carotid system: Tortuous proximal left CCA without stenosis. Mild calcified plaque at the posterior left ICA origin without  stenosis. Tortuous cervical left ICA. Vertebral arteries: Soft plaque at the right subclavian artery origin without stenosis. Normal right vertebral artery origin. Tortuous right V1 segment. Diminutive but patent right vertebral artery to the skull base. Soft and calcified plaque in the proximal left subclavian artery without significant stenosis. Normal left vertebral artery origin. Tortuous left V1 segment. The left vertebral is also somewhat diminutive but patent to the skull base without stenosis. CTA HEAD Posterior circulation: Codominant distal vertebral arteries without stenosis. The right is diminutive beyond the PICA origin. Normal left PICA origin. Patent vertebrobasilar junction. Tortuous and somewhat diminutive basilar artery with no stenosis. The basilar functionally terminates in patent SCA origins with fetal type bilateral PCA is. Bilateral PCA branches are within normal limits. Anterior circulation: Both ICA siphons are patent. On the left there is moderate calcified plaque, but ectasia of the left siphon without stenosis. Normal left posterior communicating artery origin. On the right there is mild to moderate calcified plaque and right ICA ectasia without stenosis. Normal right posterior communicating artery origin. Patent, tortuous carotid termini. Normal MCA and ACA origins. Anterior communicating artery and bilateral ACA branches are normal. Left MCA M1 segment and bifurcation are patent without stenosis. Left MCA branches are within normal limits. Right MCA M1 segment bifurcates early without stenosis. Right MCA branches are within normal limits. Venous sinuses: Early contrast timing, not well evaluated. Anatomic variants: Diminutive vertebrobasilar system in the setting of fetal type PCA origins. Review of the MIP images confirms the above findings IMPRESSION: 1. Negative for large vessel occlusion 2. 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 3. Diminutive vertebrobasilar system in the setting of fetal PCA origins (normal variant). 4. Mild atherosclerosis of the visible aorta. Electronically Signed   By: Genevie Ann M.D.   On: 09/08/2019 22:14   Mr Brain Wo Contrast  Result Date: 09/08/2019 CLINICAL DATA:  Focal neuro deficit, greater than 6 hours, stroke suspected. Additional history provided: Patient arrives via EMS with numbness of left face, left hand and left leg, blurred vision in right eye. EXAM: MRI HEAD WITHOUT CONTRAST TECHNIQUE: Multiplanar, multiecho pulse sequences of the brain and surrounding structures were obtained without intravenous contrast. COMPARISON:  Head CT 09/08/2019. FINDINGS: Brain: The diffusion-weighted sequences are of good quality. However, there is intermittent moderate motion degradation of multiple sequences, limiting evaluation. There is an acute lacunar infarct measuring 1.1 x 0.8 x 1.8 cm (AP x TV x CC) within the right corona radiata, extending inferiorly within the right internal capsule/basal ganglia. Corresponding T2/FLAIR hyperintensity at this site. There is a background of moderate chronic small vessel ischemic disease. Prominent perivascular space versus chronic lacunar infarct within the inferior right basal ganglia. No evidence of intracranial mass. No midline shift or extra-axial fluid collection. No chronic intracranial blood products are identified. Cerebral volume is normal for age. Vascular: Flow voids maintained within the proximal large arterial vessels. Skull and upper cervical spine: No focal marrow lesion identified on motion degraded imaging. Sinuses/Orbits: Visualized orbits demonstrate no acute abnormality. Mild ethmoid sinus mucosal thickening. No significant mastoid effusion. These results were called by telephone at the time of interpretation on 09/08/2019 at 7:35 pm to provider  Dr. Joselyn Glassman, who verbally acknowledged these results.  IMPRESSION: 1. Intermittently motion degraded examination as described. 2. 1.1 x 0.8 x 1.8 cm (AP x TV x CC) acute infarct within the right corona radiata, extending inferiorly within the right internal capsule/basal ganglia. 3. There is a background of moderate chronic small vessel ischemic disease. Electronically Signed   By: Kellie Simmering DO   On: 09/08/2019 19:35   Dg Knee Complete 4 Views Right  Result Date: 09/08/2019 CLINICAL DATA:  Fall, hip and knee pain EXAM: RIGHT KNEE - COMPLETE 4+ VIEW COMPARISON:  None. FINDINGS: Advanced degenerative changes within all 3 compartments of the right knee with joint space narrowing and spurring. Small joint effusion. No acute bony abnormality. Specifically, no fracture, subluxation, or dislocation. IMPRESSION: Advanced tricompartment degenerative changes. Small joint effusion. No acute bony abnormality. Electronically Signed   By: Rolm Baptise M.D.   On: 09/08/2019 19:55   Dg Femur Min 2 Views Right  Result Date: 09/08/2019 CLINICAL DATA:  Fall, hip pain EXAM: RIGHT FEMUR 2 VIEWS COMPARISON:  None. FINDINGS: Advanced arthritis in the right knee. Joint space narrowing and spurring. Small joint effusion. No acute bony abnormality. Specifically, no fracture, subluxation, or dislocation. IMPRESSION: No acute bony abnormality. Advanced degenerative changes in the right knee with small joint effusion. Electronically Signed   By: Rolm Baptise M.D.   On: 09/08/2019 19:55    PHYSICAL EXAM  Temp:  [98.2 F (36.8 C)-99.2 F (37.3 C)] 99.2 F (37.3 C) (11/18 1127) Pulse Rate:  [69-91] 79 (11/18 1127) Resp:  [16-35] 18 (11/18 1127) BP: (138-173)/(74-113) 171/97 (11/18 1127) SpO2:  [94 %-100 %] 97 % (11/18 1127) Weight:  [95.5 kg] 95.5 kg (11/18 0312)  General - Well nourished, well developed, in no apparent distress, lethargic.  Ophthalmologic - fundi not visualized due to noncooperation.  Cardiovascular - Regular rhythm and rate.  Mental Status -  Level  of arousal and orientation to year, place, and person were intact, but not to month. Language including naming, repetition, comprehension was assessed and found intact, however, paucity of speech, moderate dysarthria but stuttering intermittently.  Cranial Nerves II - XII - II - Visual field intact OU. III, IV, VI - Extraocular movements intact. V - decreased light touch sensation on the left. VII - mild left facial droop. VIII - Hearing & vestibular intact bilaterally. X - Palate elevates symmetrically, moderate dysarthria with stuttering intermittently. XI - Chin turning & shoulder shrug intact bilaterally. XII - Tongue protrusion intact.  Motor Strength - The patient's strength was symmetrical BUEs, not able to lift up RLE due to right knee pain, right foot DF and PF 4/5, LLE 3/5 proximal left foot DF and PF 3/5. Exam also complicated with some lack of effort. Bulk was normal and fasciculations were absent.   Motor Tone - Muscle tone was assessed at the neck and appendages and was normal.  Reflexes - The patient's reflexes were symmetrical in all extremities and she had no pathological reflexes.  Sensory - Light touch, temperature/pinprick were assessed and were decreased on the left.    Coordination - The patient had normal movements in the hands with no ataxia or dysmetria.  Tremor was absent.  Gait and Station - deferred.   ASSESSMENT/PLAN [name] is a 68 y.o. 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 The progressive numbness of the left upper and lower extremity and has gotten worse making her unable to walk and had a fall.  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 Small vessel disease. Atrophy. Old R basal ganglia lacune   MRI  R corona radiata infarct into internal capsule / basal ganglia. Small vessel disease.   CTA head & neck no LVO. Generalized atherosclerosis. Small VB system w/ fetal PCA. Mild Aortic atherosclerosis.   2D Echo pending  LDL 139  HgbA1c 5.7  Lovenox 40 mg sq daily for VTE prophylaxis  No antithrombotic prior to admission, now on aspirin 81 mg daily and clopidogrel 75 mg daily. continue DAPT x 3 weeks then aspirin alone  Therapy recommendations:  SNF  Disposition:  pending   Hypertension  Stable . Permissive hypertension (OK if < 220/120) but gradually normalize in 5-7 days . Long-term BP goal normotensive  Hyperlipidemia  Home meds:  No statin listed  Now on Lipitor 80  LDL 139, goal < 70  Continue statin at discharge  Other Stroke Risk Factors  Advanced age  Former Cigarette smoker  Obesity, Body mass index is 35.04 kg/m., recommend weight loss, diet and exercise as appropriate   Hx TIA x 11 per pt, no positive MRIs in EPIC  Migraines   Obstructive sleep apnea, no need for CPAP at home  Other Active Problems  Hx conversion d/o w/ seizures - followed by Dr. Jannifer Franklin at [facility]  COPD  Fibromyalgia  Depression  GERD  RA  Chronic bronchitis  Hospital day # 0  Neurology will sign off. Please call with questions. Pt will follow up with Dr. Jannifer Franklin at [facility] in about 4 weeks. Thanks for the consult.  Rosalin Hawking, MD PhD Stroke Neurology 09/09/2019 3:20 PM     To contact Stroke Continuity provider, please refer to [email or web address] After hours, contact General Neurology

## 2019-09-10 DIAGNOSIS — I639 Cerebral infarction, unspecified: Secondary | ICD-10-CM

## 2019-09-10 DIAGNOSIS — M069 Rheumatoid arthritis, unspecified: Secondary | ICD-10-CM | POA: Diagnosis not present

## 2019-09-10 DIAGNOSIS — I6381 Other cerebral infarction due to occlusion or stenosis of small artery: Secondary | ICD-10-CM | POA: Diagnosis not present

## 2019-09-10 DIAGNOSIS — F418 Other specified anxiety disorders: Secondary | ICD-10-CM | POA: Diagnosis not present

## 2019-09-10 MED ORDER — DICLOFENAC SODIUM 1 % EX GEL
2.0000 g | Freq: Four times a day (QID) | CUTANEOUS | Status: DC
  Administered 2019-09-10 – 2019-09-11 (×4): 2 g via TOPICAL
  Filled 2019-09-10: qty 100

## 2019-09-10 NOTE — Progress Notes (Signed)
Physical Therapy Treatment Patient Details Name: [name] MRN: [id number] DOB: [birth year] Today's Date: 09/10/2019    History of Present Illness [name] is an 68 y.o. female with medical history significant for rheumatoid arthritis, COPD, seizure disorder, depression with anxiety, and history of TIA, now presenting to the emergency department for evaluation of focal numbness, speech difficulty, and blurred vision.  Patient developed left-sided numbness and weakness several days ago which has progressively worsened and resulted in a fall today.  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    PT Comments    Pt performed gt training and functional mobility during session.  She was able to progress gt with close chair follow and has difficulty progressing L foot step length.  Continue to recommend aggressive therapies at CIR.  Plan next session for continued progression of functional mobility.      Follow Up Recommendations  CIR     Equipment Recommendations  Other (comment)(TBA)    Recommendations for Other Services Rehab consult     Precautions / Restrictions Precautions Precautions: Fall Restrictions Weight Bearing Restrictions: No    Mobility  Bed Mobility Overal bed mobility: Needs Assistance Bed Mobility: Supine to Sit Rolling: Min assist Sidelying to sit: Min assist       General bed mobility comments: Min assistance to move to edge of bed.  Transfers Overall transfer level: Needs assistance Equipment used: Rolling walker (2 wheeled) Transfers: Sit to/from Stand Sit to Stand: Min assist         General transfer comment: Cues for hand placement to and from seated surface.  Ambulation/Gait Ambulation/Gait assistance: Min assist Gait Distance (Feet): 80 Feet Assistive device:  Rolling walker (2 wheeled) Gait Pattern/deviations: Step-through pattern;Trunk flexed;Shuffle;Decreased stride length;Decreased step length - left     General Gait Details: Cues for upper trunk control, RW safety and to increase stride on L side.  Close chair follow for safety due to fatigue.   Stairs             Wheelchair Mobility    Modified Rankin (Stroke Patients Only) Modified Rankin (Stroke Patients Only) Pre-Morbid Rankin Score: Slight disability Modified Rankin: Moderately severe disability     Balance Overall balance assessment: Needs assistance Sitting-balance support: No upper extremity supported;Feet supported Sitting balance-Leahy Scale: Fair       Standing balance-Leahy Scale: Poor                              Cognition Arousal/Alertness: Awake/alert Behavior During Therapy: WFL for tasks assessed/performed Overall Cognitive Status: Impaired/Different from baseline Area of Impairment: Orientation;Attention;Memory;Following commands;Safety/judgement;Problem solving                 Orientation Level: Disoriented to;Time Current Attention Level: Selective Memory: Decreased short-term memory Following Commands: Follows one step commands with increased time;Follows one step commands inconsistently Safety/Judgement: Decreased awareness of safety   Problem Solving: Slow processing;Difficulty sequencing;Requires verbal cues;Requires tactile cues General Comments: increased time for processing      Exercises      General Comments        Pertinent Vitals/Pain Pain Assessment: Faces Faces Pain Scale: Hurts little more Pain Location: head Pain Descriptors / Indicators: Constant;Headache Pain Intervention(s): Monitored during session;Repositioned    Home Living  Prior Function            PT Goals (current goals can now be found in the care plan section) Acute Rehab PT Goals Patient Stated Goal:  to get back to baseline Potential to Achieve Goals: Good Progress towards PT goals: Progressing toward goals    Frequency    Min 4X/week      PT Plan Current plan remains appropriate    Co-evaluation              AM-PAC PT "6 Clicks" Mobility   Outcome Measure  Help needed turning from your back to your side while in a flat bed without using bedrails?: A Little Help needed moving from lying on your back to sitting on the side of a flat bed without using bedrails?: A Little Help needed moving to and from a bed to a chair (including a wheelchair)?: A Lot Help needed standing up from a chair using your arms (e.g., wheelchair or bedside chair)?: A Lot Help needed to walk in hospital room?: A Lot Help needed climbing 3-5 steps with a railing? : Total 6 Click Score: 13    End of Session Equipment Utilized During Treatment: Gait belt Activity Tolerance: Patient limited by fatigue Patient left: in bed;with bed alarm set;with call bell/phone within reach Nurse Communication: Mobility status PT Visit Diagnosis: Unsteadiness on feet (R26.81);Pain;Other symptoms and signs involving the nervous system (R29.898);Difficulty in walking, not elsewhere classified (R26.2) Pain - Right/Left: Right Pain - part of body: Knee     Time: [id number] PT Time Calculation (min) (ACUTE ONLY): 28 min  Charges:  $Gait Training: 8-22 mins $Therapeutic Activity: 8-22 mins                     Erasmo Leventhal , PTA Acute Rehabilitation Services Pager [phone] Office [phone]     Jonathan Corpus Eli Hose 09/10/2019, 5:43 PM

## 2019-09-10 NOTE — Progress Notes (Signed)
Progress Note    [name]  [id number] DOB: Feb 22, 1951  DOA: 09/08/2019 PCP: Patriciaann Clan, DO    Brief Narrative:      Medical records reviewed and are as summarized below:  Jacqeline Landry is an 68 y.o. female with medical history significant for rheumatoid arthritis, COPD, seizure disorder, depression with anxiety, and history of TIA, now presenting to the emergency department for evaluation of focal numbness, speech difficulty, and blurred vision.  Patient developed left-sided numbness and weakness several days ago which has progressively worsened and resulted in a fall today.  Patient reported difficulty finding the right words to use and also felt as though her speech was slurred. She also reports some weakness to a lesser extent involving the right side.   Assessment/Plan:   Principal Problem:   Acute ischemic stroke (HCC) Active Problems:   Depression with anxiety   Generalized convulsive epilepsy ([place])   Rheumatoid arthritis ([place])   COPD mixed type ([place])   CVA (cerebral vascular accident) ([place])   Acute ischemic CVA  -  acute infarction involving right corona radiata on MRI in ED  - Neurology consult appreciated: aspirin 81 mg daily and clopidogrel 75 mg daily. continue DAPT x 3 weeks then aspirin alone - PT/OT- CIR - CTA head and neck: Negative for large vessel occlusion.  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  -echo -A1c: 5.7 -lipid panel: LDL: 139- high-intensity statin -permissive HTN  Right knee pain -x ray shows advanced tricompartmental degenerative changes -PRN home pain med -voltaren gel -doubt gout  Seizure disorder  - topamax/neurontin/phenobarb.  Chronic bronchitis  - Stable, continue Spiriva and as-needed albuterol    Rheumatoid arthritis  - Patient reports chronic shoulder pain, managed with adalimumab  - She will  continue follow-up with Rheumatology    Obesity Estimated body mass index is 35.04 kg/m as calculated from the following:   Height as of 05/05/19: 5\' 5"  (1.651 m).   Weight as of this encounter: 95.5 kg.   Family Communication/Anticipated D/C date and plan/Code Status   DVT prophylaxis: Lovenox ordered. Code Status: Full Code.  Family Communication:  Disposition Plan: CIR?   Medical Consultants:    Neurology  Subjective:   C/o right knee pain    Objective:    Vitals:   09/09/19 2333 09/10/19 0336 09/10/19 0904 09/10/19 1136  BP: (!) 155/81 (!) 141/82 (!) 162/86 (!) 147/91  Pulse: 88 77 79 85  Resp: 18 18 12 16   Temp: 98.5 F (36.9 C) 98.3 F (36.8 C) 98.5 F (36.9 C) 99.5 F (37.5 C)  TempSrc: Oral Oral Oral Oral  SpO2: 99% 99% 98% 95%  Weight:        Intake/Output Summary (Last 24 hours) at 09/10/2019 1258 Last data filed at 09/10/2019 [id number] Gross per 24 hour  Intake 742.95 ml  Output 450 ml  Net 292.95 ml   Filed Weights   09/09/19 [id number]  Weight: 95.5 kg    Exam: Flat affect- voice difficult to understand rrr No wheezing, no increased work of breathing Right knee tender to palpation   Data Reviewed:   I have personally reviewed following labs and imaging studies:  Labs: Labs show the following:   Basic Metabolic Panel: Recent Labs  Lab 09/08/19 1440 09/08/19 1449  NA 134* 138  K 4.0 4.0  CL 104 108  CO2 17*  --   GLUCOSE 96 99  BUN 6* 6*  CREATININE 0.61 0.50  CALCIUM 8.4*  --    GFR Estimated Creatinine Clearance: 74.8 mL/min (by C-G formula based on SCr of 0.5 mg/dL). Liver Function Tests: Recent Labs  Lab 09/08/19 1440  AST 19  ALT QUANTITY NOT SUFFICIENT, UNABLE TO PERFORM TEST  ALKPHOS 97  BILITOT QUANTITY NOT SUFFICIENT, UNABLE TO PERFORM TEST  PROT 7.6  ALBUMIN 3.4*   No results for input(s): LIPASE, AMYLASE in the last 168 hours. No results for input(s): AMMONIA in the last 168 hours. Coagulation  profile Recent Labs  Lab 09/08/19 1430  INR 1.1    CBC: Recent Labs  Lab 09/08/19 1430 09/08/19 1449  WBC 7.9  --   NEUTROABS 4.9  --   HGB 11.3* 11.6*  HCT 36.8 34.0*  MCV 83.4  --   PLT 212  --    Cardiac Enzymes: Recent Labs  Lab 09/08/19 2015  CKTOTAL 37*   BNP (last 3 results) No results for input(s): PROBNP in the last 8760 hours. CBG: No results for input(s): GLUCAP in the last 168 hours. D-Dimer: No results for input(s): DDIMER in the last 72 hours. Hgb A1c: Recent Labs    09/09/19 0408  HGBA1C 5.7*   Lipid Profile: Recent Labs    09/09/19 0408  CHOL 206*  HDL 49  LDLCALC 139*  TRIG 89  CHOLHDL 4.2   Thyroid function studies: No results for input(s): TSH, T4TOTAL, T3FREE, THYROIDAB in the last 72 hours.  Invalid input(s): FREET3 Anemia work up: No results for input(s): VITAMINB12, FOLATE, FERRITIN, TIBC, IRON, RETICCTPCT in the last 72 hours. Sepsis Labs: Recent Labs  Lab 09/08/19 1430  WBC 7.9    Microbiology Recent Results (from the past 240 hour(s))  SARS CORONAVIRUS 2 (TAT 6-24 HRS) Nasopharyngeal Nasopharyngeal Swab     Status: None   Collection Time: 09/08/19  7:56 PM   Specimen: Nasopharyngeal Swab  Result Value Ref Range Status   SARS Coronavirus 2 NEGATIVE NEGATIVE Final    Comment: (NOTE) SARS-CoV-2 target nucleic acids are NOT DETECTED. The SARS-CoV-2 RNA is generally detectable in upper and lower respiratory specimens during the acute phase of infection. Negative results do not preclude SARS-CoV-2 infection, do not rule out co-infections with other pathogens, and should not be used as the sole basis for treatment or other patient management decisions. Negative results must be combined with clinical observations, patient history, and epidemiological information. The expected result is Negative. Fact Sheet for Patients: SugarRoll.be Fact Sheet for Healthcare  Providers: https://www.woods-mathews.com/ This test is not yet approved or cleared by the [place] FDA and  has been authorized for detection and/or diagnosis of SARS-CoV-2 by FDA under an Emergency Use Authorization (EUA). This EUA will remain  in effect (meaning this test can be used) for the duration of the COVID-19 declaration under Section 56 4(b)(1) of the Act, 21 U.S.C. section 360bbb-3(b)(1), unless the authorization is terminated or revoked sooner. Performed at [facility], [place] [address], [place],  [address]     Procedures and diagnostic studies:  Ct Angio Head W Or Wo Contrast  Result Date: 09/08/2019 CLINICAL DATA:  68 year old female with acute lacunar infarct of the right corona radiata and posterior lentiform on MRI today. EXAM: CT ANGIOGRAPHY HEAD AND NECK TECHNIQUE: Multidetector CT imaging of the head and neck was performed using the standard protocol during bolus administration of intravenous contrast. Multiplanar CT image reconstructions and MIPs were obtained to evaluate the vascular anatomy. Carotid stenosis measurements (  when applicable) are obtained utilizing NASCET criteria, using the distal internal carotid diameter as the denominator. CONTRAST:  74mL OMNIPAQUE IOHEXOL 350 MG/ML SOLN COMPARISON:  Brain MRI earlier today. Head CT 1520 hours today. FINDINGS: CTA NECK Skeleton: Absent dentition. No acute osseous abnormality identified. Upper chest: Negative. Other neck: Negative. Aortic arch: 3 vessel arch configuration. Distal arch soft and calcified plaque. Right carotid system: Tortuous brachiocephalic artery and proximal right CCA without stenosis. Minimal plaque proximal to the bifurcation. Mild calcified plaque at the bifurcation without stenosis. Tortuous cervical right ICA. Left carotid system: Tortuous proximal left CCA without stenosis. Mild calcified plaque at the posterior left ICA origin without stenosis. Tortuous cervical left  ICA. Vertebral arteries: Soft plaque at the right subclavian artery origin without stenosis. Normal right vertebral artery origin. Tortuous right V1 segment. Diminutive but patent right vertebral artery to the skull base. Soft and calcified plaque in the proximal left subclavian artery without significant stenosis. Normal left vertebral artery origin. Tortuous left V1 segment. The left vertebral is also somewhat diminutive but patent to the skull base without stenosis. CTA HEAD Posterior circulation: Codominant distal vertebral arteries without stenosis. The right is diminutive beyond the PICA origin. Normal left PICA origin. Patent vertebrobasilar junction. Tortuous and somewhat diminutive basilar artery with no stenosis. The basilar functionally terminates in patent SCA origins with fetal type bilateral PCA is. Bilateral PCA branches are within normal limits. Anterior circulation: Both ICA siphons are patent. On the left there is moderate calcified plaque, but ectasia of the left siphon without stenosis. Normal left posterior communicating artery origin. On the right there is mild to moderate calcified plaque and right ICA ectasia without stenosis. Normal right posterior communicating artery origin. Patent, tortuous carotid termini. Normal MCA and ACA origins. Anterior communicating artery and bilateral ACA branches are normal. Left MCA M1 segment and bifurcation are patent without stenosis. Left MCA branches are within normal limits. Right MCA M1 segment bifurcates early without stenosis. Right MCA branches are within normal limits. Venous sinuses: Early contrast timing, not well evaluated. Anatomic variants: Diminutive vertebrobasilar system in the setting of fetal type PCA origins. Review of the MIP images confirms the above findings IMPRESSION: 1. Negative for large vessel occlusion 2. 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 3. Diminutive vertebrobasilar system in the setting of fetal PCA origins (normal variant). 4. Mild atherosclerosis of the visible aorta. Electronically Signed   By: Genevie Ann M.D.   On: 09/08/2019 22:14   Dg Pelvis 1-2 Views  Result Date: 09/08/2019 CLINICAL DATA:  Fall with hip pain EXAM: PELVIS - 1-2 VIEW COMPARISON:  CT 06/10/2018 FINDINGS: SI joints are non widened. The pubic symphysis and rami appear intact. No fracture or malalignment. Right femoral neck is partially obscured by the trochanter IMPRESSION: No definite acute osseous abnormality Electronically Signed   By: Donavan Foil M.D.   On: 09/08/2019 19:56   Ct Head Wo Contrast  Result Date: 09/08/2019 CLINICAL DATA:  Speech difficulty EXAM: CT HEAD WITHOUT CONTRAST TECHNIQUE: Contiguous axial images were obtained from the base of the skull through the vertex without intravenous contrast. COMPARISON:  September 11, 2017 FINDINGS: Brain: No evidence of acute territorial infarction, hemorrhage, hydrocephalus,extra-axial collection or mass lesion/mass effect. There is dilatation the ventricles and sulci consistent with age-related atrophy. Low-attenuation changes in the deep white matter consistent with small vessel ischemia. Again noted is a lacunar infarct within the right basal ganglia.  Vascular: No hyperdense vessel or unexpected calcification. Skull: The skull is intact. No fracture or focal lesion identified. Sinuses/Orbits: The visualized paranasal sinuses and mastoid air cells are clear. The orbits and globes intact. Other: None IMPRESSION: No acute intracranial abnormality. Findings consistent with age related atrophy and chronic small vessel ischemia Lacunar infarct in the right basal ganglia. Electronically Signed   By: Prudencio Pair M.D.   On: 09/08/2019 15:36   Ct Angio Neck W Or Wo Contrast  Result Date: 09/08/2019 CLINICAL DATA:  68 year old female with acute lacunar infarct of the right corona radiata  and posterior lentiform on MRI today. EXAM: CT ANGIOGRAPHY HEAD AND NECK TECHNIQUE: Multidetector CT imaging of the head and neck was performed using the standard protocol during bolus administration of intravenous contrast. Multiplanar CT image reconstructions and MIPs were obtained to evaluate the vascular anatomy. Carotid stenosis measurements (when applicable) are obtained utilizing NASCET criteria, using the distal internal carotid diameter as the denominator. CONTRAST:  71mL OMNIPAQUE IOHEXOL 350 MG/ML SOLN COMPARISON:  Brain MRI earlier today. Head CT 1520 hours today. FINDINGS: CTA NECK Skeleton: Absent dentition. No acute osseous abnormality identified. Upper chest: Negative. Other neck: Negative. Aortic arch: 3 vessel arch configuration. Distal arch soft and calcified plaque. Right carotid system: Tortuous brachiocephalic artery and proximal right CCA without stenosis. Minimal plaque proximal to the bifurcation. Mild calcified plaque at the bifurcation without stenosis. Tortuous cervical right ICA. Left carotid system: Tortuous proximal left CCA without stenosis. Mild calcified plaque at the posterior left ICA origin without stenosis. Tortuous cervical left ICA. Vertebral arteries: Soft plaque at the right subclavian artery origin without stenosis. Normal right vertebral artery origin. Tortuous right V1 segment. Diminutive but patent right vertebral artery to the skull base. Soft and calcified plaque in the proximal left subclavian artery without significant stenosis. Normal left vertebral artery origin. Tortuous left V1 segment. The left vertebral is also somewhat diminutive but patent to the skull base without stenosis. CTA HEAD Posterior circulation: Codominant distal vertebral arteries without stenosis. The right is diminutive beyond the PICA origin. Normal left PICA origin. Patent vertebrobasilar junction. Tortuous and somewhat diminutive basilar artery with no stenosis. The basilar functionally  terminates in patent SCA origins with fetal type bilateral PCA is. Bilateral PCA branches are within normal limits. Anterior circulation: Both ICA siphons are patent. On the left there is moderate calcified plaque, but ectasia of the left siphon without stenosis. Normal left posterior communicating artery origin. On the right there is mild to moderate calcified plaque and right ICA ectasia without stenosis. Normal right posterior communicating artery origin. Patent, tortuous carotid termini. Normal MCA and ACA origins. Anterior communicating artery and bilateral ACA branches are normal. Left MCA M1 segment and bifurcation are patent without stenosis. Left MCA branches are within normal limits. Right MCA M1 segment bifurcates early without stenosis. Right MCA branches are within normal limits. Venous sinuses: Early contrast timing, not well evaluated. Anatomic variants: Diminutive vertebrobasilar system in the setting of fetal type PCA origins. Review of the MIP images confirms the above findings IMPRESSION: 1. Negative for large vessel occlusion 2. 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 3. Diminutive vertebrobasilar system in the setting of fetal PCA origins (normal variant). 4. Mild atherosclerosis of the visible aorta. Electronically Signed   By: Genevie Ann M.D.   On: 09/08/2019 22:14   Mr Brain Wo Contrast  Result Date: 09/08/2019 CLINICAL DATA:  Focal neuro  deficit, greater than 6 hours, stroke suspected. Additional history provided: Patient arrives via EMS with numbness of left face, left hand and left leg, blurred vision in right eye. EXAM: MRI HEAD WITHOUT CONTRAST TECHNIQUE: Multiplanar, multiecho pulse sequences of the brain and surrounding structures were obtained without intravenous contrast. COMPARISON:  Head CT 09/08/2019. FINDINGS: Brain: The diffusion-weighted sequences are of good  quality. However, there is intermittent moderate motion degradation of multiple sequences, limiting evaluation. There is an acute lacunar infarct measuring 1.1 x 0.8 x 1.8 cm (AP x TV x CC) within the right corona radiata, extending inferiorly within the right internal capsule/basal ganglia. Corresponding T2/FLAIR hyperintensity at this site. There is a background of moderate chronic small vessel ischemic disease. Prominent perivascular space versus chronic lacunar infarct within the inferior right basal ganglia. No evidence of intracranial mass. No midline shift or extra-axial fluid collection. No chronic intracranial blood products are identified. Cerebral volume is normal for age. Vascular: Flow voids maintained within the proximal large arterial vessels. Skull and upper cervical spine: No focal marrow lesion identified on motion degraded imaging. Sinuses/Orbits: Visualized orbits demonstrate no acute abnormality. Mild ethmoid sinus mucosal thickening. No significant mastoid effusion. These results were called by telephone at the time of interpretation on 09/08/2019 at 7:35 pm to provider Dr. Joselyn Glassman, who verbally acknowledged these results. IMPRESSION: 1. Intermittently motion degraded examination as described. 2. 1.1 x 0.8 x 1.8 cm (AP x TV x CC) acute infarct within the right corona radiata, extending inferiorly within the right internal capsule/basal ganglia. 3. There is a background of moderate chronic small vessel ischemic disease. Electronically Signed   By: Kellie Simmering DO   On: 09/08/2019 19:35   Dg Knee Complete 4 Views Right  Result Date: 09/08/2019 CLINICAL DATA:  Fall, hip and knee pain EXAM: RIGHT KNEE - COMPLETE 4+ VIEW COMPARISON:  None. FINDINGS: Advanced degenerative changes within all 3 compartments of the right knee with joint space narrowing and spurring. Small joint effusion. No acute bony abnormality. Specifically, no fracture, subluxation, or dislocation. IMPRESSION: Advanced  tricompartment degenerative changes. Small joint effusion. No acute bony abnormality. Electronically Signed   By: Rolm Baptise M.D.   On: 09/08/2019 19:55   Dg Femur Min 2 Views Right  Result Date: 09/08/2019 CLINICAL DATA:  Fall, hip pain EXAM: RIGHT FEMUR 2 VIEWS COMPARISON:  None. FINDINGS: Advanced arthritis in the right knee. Joint space narrowing and spurring. Small joint effusion. No acute bony abnormality. Specifically, no fracture, subluxation, or dislocation. IMPRESSION: No acute bony abnormality. Advanced degenerative changes in the right knee with small joint effusion. Electronically Signed   By: Rolm Baptise M.D.   On: 09/08/2019 19:55    Medications:    aspirin EC  81 mg Oral Daily   atorvastatin  80 mg Oral q1800   citalopram  20 mg Oral QHS   clopidogrel  75 mg Oral Daily   diclofenac Sodium  2 g Topical QID   enoxaparin (LOVENOX) injection  40 mg Subcutaneous Q24H   gabapentin  900 mg Oral BID   HYDROcodone-acetaminophen  1 tablet Oral Daily   HYDROcodone-acetaminophen  2 tablet Oral QHS   pantoprazole  40 mg Oral Daily   phenobarbital  97.2 mg Oral Daily   sodium chloride flush  10-40 mL Intracatheter Q12H   sodium chloride flush  3 mL Intravenous Once   topiramate  100 mg Oral QHS   topiramate  50 mg Oral Daily   umeclidinium bromide  1 puff Inhalation Daily  Continuous Infusions:    LOS: 1 day   Geradine Girt  [facility]   How to contact the [facility] Attending or Consulting provider [place] or covering provider during after hours [place], for this patient?  1. Check the care team in [facility] and look for a) attending/consulting [facility] provider listed and b) the [facility] team listed 2. Log into [email or web address] and use [place]'s universal password to access. If you do not have the password, please contact the hospital operator. 3. Locate the [facility] provider you are looking for under [facility] and page to a number that you can be directly  reached. 4. If you still have difficulty reaching the provider, please page the [facility] (Director on Call) for the Hospitalists listed on amion for assistance.  09/10/2019, 12:58 PM

## 2019-09-10 NOTE — Progress Notes (Signed)
Inpatient Rehabilitation-Admissions Coordinator   Met with pt at the bedside. She was complaining of knee and head pain (RN aware). Pt very soft spoken and fatigued but able to hold conversation with [facility]. We discussed her prior level of function and how she currently feels with regards to her mobility since her stroke. Pt tearful at times and appears to understand the need for a rehab program. Pt interested in CIR program and gave permission for this [facility] to call her family to confirm support. I spoke with her husband who is unsure if the patient will be able to have any assist at DC as he works during the day. I am awaiting a call back from her daughter to see what support she might be able to provide. Anticipate pt will need Supervision at DC due to current functional deficits. Husband aware that if recommended support is not available that pt will need SNF.   Await call back from family at this time.   Jhonnie Garner, OTR/L  Rehab Admissions Coordinator  [phone] 09/10/2019 1:31 PM

## 2019-09-11 DIAGNOSIS — M069 Rheumatoid arthritis, unspecified: Secondary | ICD-10-CM | POA: Diagnosis not present

## 2019-09-11 DIAGNOSIS — I6381 Other cerebral infarction due to occlusion or stenosis of small artery: Secondary | ICD-10-CM | POA: Diagnosis not present

## 2019-09-11 DIAGNOSIS — F418 Other specified anxiety disorders: Secondary | ICD-10-CM | POA: Diagnosis not present

## 2019-09-11 DIAGNOSIS — I639 Cerebral infarction, unspecified: Secondary | ICD-10-CM | POA: Diagnosis not present

## 2019-09-11 LAB — GLUCOSE, CAPILLARY: Glucose-Capillary: 75 mg/dL (ref 70–99)

## 2019-09-11 MED ORDER — DIPHENHYDRAMINE HCL 50 MG/ML IJ SOLN
12.5000 mg | Freq: Once | INTRAMUSCULAR | Status: AC
  Administered 2019-09-11: 11:00:00 12.5 mg via INTRAVENOUS
  Filled 2019-09-11: qty 1

## 2019-09-11 MED ORDER — PROCHLORPERAZINE EDISYLATE 10 MG/2ML IJ SOLN
10.0000 mg | Freq: Once | INTRAMUSCULAR | Status: AC
  Administered 2019-09-11: 10 mg via INTRAVENOUS
  Filled 2019-09-11: qty 2

## 2019-09-11 MED ORDER — CLOPIDOGREL BISULFATE 75 MG PO TABS: 75.0000 mg | ORAL_TABLET | Freq: Every day | ORAL | 0 refills | Status: AC

## 2019-09-11 MED ORDER — ATORVASTATIN CALCIUM 80 MG PO TABS: 80.0000 mg | ORAL_TABLET | Freq: Every day | ORAL | 0 refills | Status: DC

## 2019-09-11 MED ORDER — ASPIRIN 81 MG PO TBEC: 81.0000 mg | DELAYED_RELEASE_TABLET | Freq: Every day | ORAL | 0 refills | Status: AC

## 2019-09-11 NOTE — Progress Notes (Signed)
Inpatient Rehabilitation-Admissions Coordinator   Noted great improvement in functional ability today with PT. Pt prefers home at this time.  Noted pt and family in agreement with outpatient therapy.   AC will sign off.   Jhonnie Garner, OTR/L  Rehab Admissions Coordinator  [phone] 09/11/2019 5:41 PM

## 2019-09-11 NOTE — Progress Notes (Signed)
  Speech Language Pathology Treatment: Cognitive-Linquistic  Patient Details Name: [name] MRN: [id number] DOB: 04-Dec-1950 Today's Date: 09/11/2019 Time: [id number] SLP Time Calculation (min) (ACUTE ONLY): 14 min  Assessment / Plan / Recommendation Clinical Impression  Pt was seen for skilled ST targeting cognitive goals.  Pt was much clearer today in comparison to previous therapy session.  She was oriented to place and situation independently.  Speech was much clearer today and fully intelligible in conversations.  Pt and husband both endorse that pt is near her baseline for speech and cognition.  Pt really wants to go home; therefore, SLP provided skilled education recommending that pt have assistance for medication and financial management at discharge if she should go home.  Both pt and husband are in agreement with recommendations.  Pt seemed at least generally aware of changes to her medication regimen but could maybe benefit from further discussion regarding specific medications as well as their function and frequency as part of ongoing diagnostic treatment of cognition while inpatient.  Pt was left in bed with husband at bedside.  Continue per current plan of care.    HPI HPI: 68 yo female adm to [facility] with left face, left hand and left leg numbness and  blurred vision in right eye.  Pt found to have an acute right basal ganglia and corona radiata CVA.  She passed a yale swallow screen.  Pt has h/o "miniature strokes".  Speech evaluation ordered.  In 2018 she was found to have a cyst below the basal ganglia.  Pt with PMH + depression, COPD.      SLP Plan  Continue with current plan of care       Recommendations                   Follow up Recommendations: Home health SLP SLP Visit Diagnosis: Dysarthria and anarthria (R47.1);Cognitive communication deficit (R41.841) Plan: Continue with current plan of care       GO                Ayomide Purdy, Selinda Orion 09/11/2019, 11:56  AM

## 2019-09-11 NOTE — Progress Notes (Signed)
D/C instructions provided to patient, denies questions/concerns at this time. Patient transported to front entrance via WC, tol well. 

## 2019-09-11 NOTE — Discharge Summary (Signed)
PATIENT OF FAMILY MEDICINE TEACHING SERVICE     Physician Discharge Summary  [name] [id number] DOB: 11/05/1950 DOA: 09/08/2019  PCP: Patriciaann Clan, DO  Admit date: 09/08/2019 Discharge date: 09/11/2019  Admitted From: home Discharge disposition: home   Recommendations for Outpatient Follow-Up:   Outpatient PT- refused SNF, did not qualify for home health ASA/plavix x 3 weeks then ASA alone  Discharge Diagnosis:   Principal Problem:   Acute ischemic stroke [facility]) Active Problems:   Depression with anxiety   Generalized convulsive epilepsy ([place])   Rheumatoid arthritis ([place])   COPD mixed type ([place])   CVA (cerebral vascular accident) [facility])    Discharge Condition: Improved.  Diet recommendation: Low sodium, heart healthy.  Wound care: None.  Code status: Full.   History of Present Illness:   [name] is a 67 y.o. female with medical history significant for rheumatoid arthritis, COPD, seizure disorder, depression with anxiety, and history of TIA, now presenting to the emergency department for evaluation of focal numbness, speech difficulty, and blurred vision.  Patient developed left-sided numbness and weakness several days ago which has progressively worsened and resulted in a fall today.  Patient reported difficulty finding the right words to use and also felt as though her speech was slurred. She also reports some weakness to a lesser extent involving the right side. She denies chest pain or palpitations. Denies fevers or chills. Reports chronic cough secondary to chronic bronchitis.     Hospital Course by Problem:   Acute ischemic CVA - acute infarction involving right corona radiata on MRI in ED -Neurology consult appreciated: aspirin 81 mg daily and clopidogrel 75 mg daily.continue DAPT x 3 weeks then aspirin alone -PT/OT- CIR - CTA head and neck: Negative for large vessel occlusion.  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  -echo unrevealing -A1c: 5.7 -lipid panel: LDL: 139- high-intensity statin   Right knee pain -x ray shows advanced tricompartmental degenerative changes -PRN home pain med  Seizure disorder -topamax/neurontin/phenobarb.  Chronic bronchitis -Stable, continue Spiriva and as-needed albuterol  Rheumatoid arthritis -Patient reports chronic shoulder pain, managed with adalimumab -She will continue follow-up with Rheumatology  Obesity Estimated body mass index is 35.04 kg/m as calculated from the following:   Height as of 05/05/19: 5\' 5"  (1.651 m).   Weight as of this encounter: 95.5 kg.    Medical Consultants:   neurology   Discharge Exam:   Vitals:   09/11/19 0805 09/11/19 1200  BP:  (!) 150/93  Pulse:  82  Resp:  20  Temp:  98.5 F (36.9 C)  SpO2: 95% 96%   Vitals:   09/11/19 0345 09/11/19 0800 09/11/19 0805 09/11/19 1200  BP: 136/79 (!) 152/87  (!) 150/93  Pulse: 82 79  82  Resp: 20 20  20   Temp: 98.3 F (36.8 C) 98.3 F (36.8 C)  98.5 F (36.9 C)  TempSrc: Oral Oral  Oral  SpO2: 95% 97% 95% 96%  Weight:        General exam: Appears calm and comfortable.   The results of significant diagnostics from this hospitalization (including imaging, microbiology, ancillary and laboratory) are listed below for reference.     Procedures and Diagnostic Studies:   Ct Angio Head W Or Wo Contrast  Result Date: 09/08/2019 CLINICAL DATA:  68 year old female with acute lacunar infarct of the right corona radiata and posterior lentiform on MRI today. EXAM: CT ANGIOGRAPHY  HEAD AND NECK TECHNIQUE: Multidetector CT imaging of the head and neck was performed using the standard protocol during bolus administration of intravenous contrast. Multiplanar CT image reconstructions and MIPs were obtained to evaluate the vascular anatomy. Carotid stenosis  measurements (when applicable) are obtained utilizing NASCET criteria, using the distal internal carotid diameter as the denominator. CONTRAST:  71mL OMNIPAQUE IOHEXOL 350 MG/ML SOLN COMPARISON:  Brain MRI earlier today. Head CT 1520 hours today. FINDINGS: CTA NECK Skeleton: Absent dentition. No acute osseous abnormality identified. Upper chest: Negative. Other neck: Negative. Aortic arch: 3 vessel arch configuration. Distal arch soft and calcified plaque. Right carotid system: Tortuous brachiocephalic artery and proximal right CCA without stenosis. Minimal plaque proximal to the bifurcation. Mild calcified plaque at the bifurcation without stenosis. Tortuous cervical right ICA. Left carotid system: Tortuous proximal left CCA without stenosis. Mild calcified plaque at the posterior left ICA origin without stenosis. Tortuous cervical left ICA. Vertebral arteries: Soft plaque at the right subclavian artery origin without stenosis. Normal right vertebral artery origin. Tortuous right V1 segment. Diminutive but patent right vertebral artery to the skull base. Soft and calcified plaque in the proximal left subclavian artery without significant stenosis. Normal left vertebral artery origin. Tortuous left V1 segment. The left vertebral is also somewhat diminutive but patent to the skull base without stenosis. CTA HEAD Posterior circulation: Codominant distal vertebral arteries without stenosis. The right is diminutive beyond the PICA origin. Normal left PICA origin. Patent vertebrobasilar junction. Tortuous and somewhat diminutive basilar artery with no stenosis. The basilar functionally terminates in patent SCA origins with fetal type bilateral PCA is. Bilateral PCA branches are within normal limits. Anterior circulation: Both ICA siphons are patent. On the left there is moderate calcified plaque, but ectasia of the left siphon without stenosis. Normal left posterior communicating artery origin. On the right there is mild  to moderate calcified plaque and right ICA ectasia without stenosis. Normal right posterior communicating artery origin. Patent, tortuous carotid termini. Normal MCA and ACA origins. Anterior communicating artery and bilateral ACA branches are normal. Left MCA M1 segment and bifurcation are patent without stenosis. Left MCA branches are within normal limits. Right MCA M1 segment bifurcates early without stenosis. Right MCA branches are within normal limits. Venous sinuses: Early contrast timing, not well evaluated. Anatomic variants: Diminutive vertebrobasilar system in the setting of fetal type PCA origins. Review of the MIP images confirms the above findings IMPRESSION: 1. Negative for large vessel occlusion 2. 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 3. Diminutive vertebrobasilar system in the setting of fetal PCA origins (normal variant). 4. Mild atherosclerosis of the visible aorta. Electronically Signed   By: Genevie Ann M.D.   On: 09/08/2019 22:14   Dg Pelvis 1-2 Views  Result Date: 09/08/2019 CLINICAL DATA:  Fall with hip pain EXAM: PELVIS - 1-2 VIEW COMPARISON:  CT 06/10/2018 FINDINGS: SI joints are non widened. The pubic symphysis and rami appear intact. No fracture or malalignment. Right femoral neck is partially obscured by the trochanter IMPRESSION: No definite acute osseous abnormality Electronically Signed   By: Donavan Foil M.D.   On: 09/08/2019 19:56   Ct Head Wo Contrast  Result Date: 09/08/2019 CLINICAL DATA:  Speech difficulty EXAM: CT HEAD WITHOUT CONTRAST TECHNIQUE: Contiguous axial images were obtained from the base of the skull through the vertex without intravenous contrast. COMPARISON:  September 11, 2017 FINDINGS: Brain: No evidence of acute territorial infarction, hemorrhage,  hydrocephalus,extra-axial collection or mass lesion/mass effect. There is dilatation the ventricles  and sulci consistent with age-related atrophy. Low-attenuation changes in the deep white matter consistent with small vessel ischemia. Again noted is a lacunar infarct within the right basal ganglia. Vascular: No hyperdense vessel or unexpected calcification. Skull: The skull is intact. No fracture or focal lesion identified. Sinuses/Orbits: The visualized paranasal sinuses and mastoid air cells are clear. The orbits and globes intact. Other: None IMPRESSION: No acute intracranial abnormality. Findings consistent with age related atrophy and chronic small vessel ischemia Lacunar infarct in the right basal ganglia. Electronically Signed   By: Prudencio Pair M.D.   On: 09/08/2019 15:36   Ct Angio Neck W Or Wo Contrast  Result Date: 09/08/2019 CLINICAL DATA:  68 year old female with acute lacunar infarct of the right corona radiata and posterior lentiform on MRI today. EXAM: CT ANGIOGRAPHY HEAD AND NECK TECHNIQUE: Multidetector CT imaging of the head and neck was performed using the standard protocol during bolus administration of intravenous contrast. Multiplanar CT image reconstructions and MIPs were obtained to evaluate the vascular anatomy. Carotid stenosis measurements (when applicable) are obtained utilizing NASCET criteria, using the distal internal carotid diameter as the denominator. CONTRAST:  69mL OMNIPAQUE IOHEXOL 350 MG/ML SOLN COMPARISON:  Brain MRI earlier today. Head CT 1520 hours today. FINDINGS: CTA NECK Skeleton: Absent dentition. No acute osseous abnormality identified. Upper chest: Negative. Other neck: Negative. Aortic arch: 3 vessel arch configuration. Distal arch soft and calcified plaque. Right carotid system: Tortuous brachiocephalic artery and proximal right CCA without stenosis. Minimal plaque proximal to the bifurcation. Mild calcified plaque at the bifurcation without stenosis. Tortuous cervical right ICA. Left carotid system: Tortuous proximal left CCA without stenosis. Mild calcified  plaque at the posterior left ICA origin without stenosis. Tortuous cervical left ICA. Vertebral arteries: Soft plaque at the right subclavian artery origin without stenosis. Normal right vertebral artery origin. Tortuous right V1 segment. Diminutive but patent right vertebral artery to the skull base. Soft and calcified plaque in the proximal left subclavian artery without significant stenosis. Normal left vertebral artery origin. Tortuous left V1 segment. The left vertebral is also somewhat diminutive but patent to the skull base without stenosis. CTA HEAD Posterior circulation: Codominant distal vertebral arteries without stenosis. The right is diminutive beyond the PICA origin. Normal left PICA origin. Patent vertebrobasilar junction. Tortuous and somewhat diminutive basilar artery with no stenosis. The basilar functionally terminates in patent SCA origins with fetal type bilateral PCA is. Bilateral PCA branches are within normal limits. Anterior circulation: Both ICA siphons are patent. On the left there is moderate calcified plaque, but ectasia of the left siphon without stenosis. Normal left posterior communicating artery origin. On the right there is mild to moderate calcified plaque and right ICA ectasia without stenosis. Normal right posterior communicating artery origin. Patent, tortuous carotid termini. Normal MCA and ACA origins. Anterior communicating artery and bilateral ACA branches are normal. Left MCA M1 segment and bifurcation are patent without stenosis. Left MCA branches are within normal limits. Right MCA M1 segment bifurcates early without stenosis. Right MCA branches are within normal limits. Venous sinuses: Early contrast timing, not well evaluated. Anatomic variants: Diminutive vertebrobasilar system in the setting of fetal type PCA origins. Review of the MIP images confirms the above findings IMPRESSION: 1. Negative for large vessel occlusion 2. 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 3. Diminutive vertebrobasilar system in the setting of  fetal PCA origins (normal variant). 4. Mild atherosclerosis of the visible aorta. Electronically Signed   By: Genevie Ann M.D.   On: 09/08/2019 22:14   Mr Brain Wo Contrast  Result Date: 09/08/2019 CLINICAL DATA:  Focal neuro deficit, greater than 6 hours, stroke suspected. Additional history provided: Patient arrives via EMS with numbness of left face, left hand and left leg, blurred vision in right eye. EXAM: MRI HEAD WITHOUT CONTRAST TECHNIQUE: Multiplanar, multiecho pulse sequences of the brain and surrounding structures were obtained without intravenous contrast. COMPARISON:  Head CT 09/08/2019. FINDINGS: Brain: The diffusion-weighted sequences are of good quality. However, there is intermittent moderate motion degradation of multiple sequences, limiting evaluation. There is an acute lacunar infarct measuring 1.1 x 0.8 x 1.8 cm (AP x TV x CC) within the right corona radiata, extending inferiorly within the right internal capsule/basal ganglia. Corresponding T2/FLAIR hyperintensity at this site. There is a background of moderate chronic small vessel ischemic disease. Prominent perivascular space versus chronic lacunar infarct within the inferior right basal ganglia. No evidence of intracranial mass. No midline shift or extra-axial fluid collection. No chronic intracranial blood products are identified. Cerebral volume is normal for age. Vascular: Flow voids maintained within the proximal large arterial vessels. Skull and upper cervical spine: No focal marrow lesion identified on motion degraded imaging. Sinuses/Orbits: Visualized orbits demonstrate no acute abnormality. Mild ethmoid sinus mucosal thickening. No significant mastoid effusion. These results were called by telephone at the time of interpretation on 09/08/2019 at 7:35 pm to provider Dr.  Joselyn Glassman, who verbally acknowledged these results. IMPRESSION: 1. Intermittently motion degraded examination as described. 2. 1.1 x 0.8 x 1.8 cm (AP x TV x CC) acute infarct within the right corona radiata, extending inferiorly within the right internal capsule/basal ganglia. 3. There is a background of moderate chronic small vessel ischemic disease. Electronically Signed   By: Kellie Simmering DO   On: 09/08/2019 19:35   Dg Knee Complete 4 Views Right  Result Date: 09/08/2019 CLINICAL DATA:  Fall, hip and knee pain EXAM: RIGHT KNEE - COMPLETE 4+ VIEW COMPARISON:  None. FINDINGS: Advanced degenerative changes within all 3 compartments of the right knee with joint space narrowing and spurring. Small joint effusion. No acute bony abnormality. Specifically, no fracture, subluxation, or dislocation. IMPRESSION: Advanced tricompartment degenerative changes. Small joint effusion. No acute bony abnormality. Electronically Signed   By: Rolm Baptise M.D.   On: 09/08/2019 19:55   Dg Femur Min 2 Views Right  Result Date: 09/08/2019 CLINICAL DATA:  Fall, hip pain EXAM: RIGHT FEMUR 2 VIEWS COMPARISON:  None. FINDINGS: Advanced arthritis in the right knee. Joint space narrowing and spurring. Small joint effusion. No acute bony abnormality. Specifically, no fracture, subluxation, or dislocation. IMPRESSION: No acute bony abnormality. Advanced degenerative changes in the right knee with small joint effusion. Electronically Signed   By: Rolm Baptise M.D.   On: 09/08/2019 19:55     Labs:   Basic Metabolic Panel: Recent Labs  Lab 09/08/19 1440 09/08/19 1449  NA 134* 138  K 4.0 4.0  CL 104 108  CO2 17*  --   GLUCOSE 96 99  BUN 6* 6*  CREATININE 0.61 0.50  CALCIUM 8.4*  --    GFR Estimated Creatinine Clearance: 74.8 mL/min (by C-G formula based on SCr of 0.5 mg/dL). Liver Function Tests: Recent Labs  Lab 09/08/19 1440  AST 19  ALT QUANTITY NOT SUFFICIENT, UNABLE TO PERFORM TEST  ALKPHOS 97  BILITOT  QUANTITY NOT SUFFICIENT, UNABLE  TO PERFORM TEST  PROT 7.6  ALBUMIN 3.4*   No results for input(s): LIPASE, AMYLASE in the last 168 hours. No results for input(s): AMMONIA in the last 168 hours. Coagulation profile Recent Labs  Lab 09/08/19 1430  INR 1.1    CBC: Recent Labs  Lab 09/08/19 1430 09/08/19 1449  WBC 7.9  --   NEUTROABS 4.9  --   HGB 11.3* 11.6*  HCT 36.8 34.0*  MCV 83.4  --   PLT 212  --    Cardiac Enzymes: Recent Labs  Lab 09/08/19 2015  CKTOTAL 37*   BNP: Invalid input(s): POCBNP CBG: Recent Labs  Lab 09/11/19 1203  GLUCAP 75   D-Dimer No results for input(s): DDIMER in the last 72 hours. Hgb A1c Recent Labs    09/09/19 0408  HGBA1C 5.7*   Lipid Profile Recent Labs    09/09/19 0408  CHOL 206*  HDL 49  LDLCALC 139*  TRIG 89  CHOLHDL 4.2   Thyroid function studies No results for input(s): TSH, T4TOTAL, T3FREE, THYROIDAB in the last 72 hours.  Invalid input(s): FREET3 Anemia work up No results for input(s): VITAMINB12, FOLATE, FERRITIN, TIBC, IRON, RETICCTPCT in the last 72 hours. Microbiology Recent Results (from the past 240 hour(s))  SARS CORONAVIRUS 2 (TAT 6-24 HRS) Nasopharyngeal Nasopharyngeal Swab     Status: None   Collection Time: 09/08/19  7:56 PM   Specimen: Nasopharyngeal Swab  Result Value Ref Range Status   SARS Coronavirus 2 NEGATIVE NEGATIVE Final    Comment: (NOTE) SARS-CoV-2 target nucleic acids are NOT DETECTED. The SARS-CoV-2 RNA is generally detectable in upper and lower respiratory specimens during the acute phase of infection. Negative results do not preclude SARS-CoV-2 infection, do not rule out co-infections with other pathogens, and should not be used as the sole basis for treatment or other patient management decisions. Negative results must be combined with clinical observations, patient history, and epidemiological information. The expected result is Negative. Fact Sheet for  Patients: SugarRoll.be Fact Sheet for Healthcare Providers: https://www.woods-mathews.com/ This test is not yet approved or cleared by the [place] FDA and  has been authorized for detection and/or diagnosis of SARS-CoV-2 by FDA under an Emergency Use Authorization (EUA). This EUA will remain  in effect (meaning this test can be used) for the duration of the COVID-19 declaration under Section 56 4(b)(1) of the Act, 21 U.S.C. section 360bbb-3(b)(1), unless the authorization is terminated or revoked sooner. Performed at [facility], [place] [address] [place], [place] [address]      Discharge Instructions:   Discharge Instructions    Ambulatory referral to Neurology   Complete by: As directed    An appointment is requested in approximately: 4 weeks. Pt is Dr. Tobey Grim pt. Thanks.   Diet - low sodium heart healthy   Complete by: As directed    Discharge instructions   Complete by: As directed    aspirin 81 mg daily and clopidogrel 75 mg daily for x 3 weeks then aspirin alone Home health   Increase activity slowly   Complete by: As directed      Allergies as of 09/11/2019      Reactions   Amoxicillin    REACTION: hives   Aspirin    REACTION: rash   Codeine Phosphate    REACTION: N/V   Flexeril [cyclobenzaprine] Nausea And Vomiting   Dizziness   Ibuprofen Hives   Penicillins    REACTION: hives   Tetanus Toxoids Swelling  Medication List    STOP taking these medications   dicyclomine 20 MG tablet Commonly known as: Bentyl   ferrous sulfate 325 (65 FE) MG tablet   methocarbamol 500 MG tablet Commonly known as: Robaxin   NEOMYCIN-POLYMYXIN-HYDROCORTISONE 1 % Soln OTIC solution Commonly known as: CORTISPORIN   nortriptyline 10 MG capsule Commonly known as: PAMELOR   ondansetron 4 MG disintegrating tablet Commonly known as: Zofran ODT   triamcinolone ointment 0.5 % Commonly known as: KENALOG     TAKE  these medications   Adalimumab 40 MG/0.4ML Pnkt Inject 1 pen into the skin every 7 (seven) days.   albuterol 108 (90 Base) MCG/ACT inhaler Commonly known as: VENTOLIN HFA INHALE 2 PUFFS INTO THE LUNGS EVERY 6 HOURS AS NEEDED FOR WHEEZING ORSHORTNESS OF BREATH. What changed: See the new instructions.   alendronate 70 MG tablet Commonly known as: FOSAMAX Take 70 mg by mouth once a week. Fridays   aspirin 81 MG EC tablet Take 1 tablet (81 mg total) by mouth daily. Start taking on: September 12, 2019   atorvastatin 80 MG tablet Commonly known as: LIPITOR Take 1 tablet (80 mg total) by mouth daily at 6 PM.   carvedilol 3.125 MG tablet Commonly known as: COREG TAKE 1 TABLET BY MOUTH TWICE DAILY WITH A MEAL What changed:   how much to take  how to take this  when to take this  additional instructions   citalopram 20 MG tablet Commonly known as: CELEXA TAKE 1 TABLET BY MOUTH ONCE DAILY What changed:   when to take this  additional instructions   clopidogrel 75 MG tablet Commonly known as: PLAVIX Take 1 tablet (75 mg total) by mouth daily for 20 days. Start taking on: September 12, 2019   clotrimazole 1 % cream Commonly known as: LOTRIMIN Apply 1 application topically 2 (two) times daily. Under breasts   folic acid 1 MG tablet Commonly known as: FOLVITE Take 1 mg by mouth.   gabapentin 300 MG capsule Commonly known as: NEURONTIN TAKE 3 CAPSULES BY MOUTH TWICE DAILY   HYDROcodone-acetaminophen 5-325 MG tablet Commonly known as: NORCO/VICODIN Take 1 tablet by mouth 3 (three) times daily as needed for moderate pain.   HYDROcodone-acetaminophen 10-325 MG tablet Commonly known as: NORCO Take 1-2 tablets by mouth See admin instructions. 1 tablet in the morning and 2 tablets at night   omeprazole 20 MG capsule Commonly known as: PRILOSEC TAKE 1 CAPSULE BY MOUTH ONCE DAILY   PHENObarbital 100 MG tablet Commonly known as: LUMINAL Take 1 tablet (100 mg total) by  mouth daily.   tiotropium 18 MCG inhalation capsule Commonly known as: Spiriva HandiHaler INHALE THE CONTENTS OF 1 CAPSULE VIA HANDIHALER BY MOUTH ONCE DAILY What changed:   how much to take  how to take this  when to take this  additional instructions   topiramate 50 MG tablet Commonly known as: TOPAMAX TAKE 1 TABLET BY MOUTH EVERY MORNING AND2 TABS BY MOUTH AT BEDTIME DAILY What changed:   how much to take  how to take this  when to take this  additional instructions      Follow-up Information    Kathrynn Ducking, MD Follow up.   Specialty: Neurology Contact information: [place] [address] [phone]        Patriciaann Clan, DO Follow up in 1 week(s).   Specialty: Family Medicine Contact information: [id number] [place] [address] [phone]  Time coordinating discharge: 35 min  Signed:  Geradine Girt DO  [facility] 09/11/2019, 1:20 PM

## 2019-09-11 NOTE — TOC Transition Note (Signed)
Transition of Care [facility]) - CM/SW Discharge Note   Patient Details  Name: [name] MRN: [id number] Date of Birth: 03/02/1951  Transition of Care [facility]) CM/SW Contact:  Pollie Friar, RN Phone Number: 09/11/2019, 2:29 PM   Clinical Narrative:    Pt discharging home with outpatient therapy. Pt with orders for [facility] but her insurance doesn't cover [facility] [place]. MD agreeable with outpatient therapy.  CM met with the patient and [facility] selected. Orders in Epic and information on the AVS. Spouse to provide supervision at home and transportation to home.   Final next level of care: OP Rehab Barriers to Discharge: No Barriers Identified   Patient Goals and CMS Choice     Choice offered to / list presented to : Patient  Discharge Placement                       Discharge Plan and Services                                     Social Determinants of Health (SDOH) Interventions     Readmission Risk Interventions No flowsheet data found.

## 2019-09-11 NOTE — Progress Notes (Signed)
Inpatient Rehabilitation-Admissions Coordinator   Met with pt and her husband in the room. Upon entering, pt stating she thinks he can go home and feels much better. She is also not sure she would want to stay in the hospital for her rehab. I have contacted PT for assessment today to see if she is appropriate for any other post acute therapy settings (HH vs outpatient).   AC will follow up once pt has made final decision regarding rehab venue.   Kelly Wolfe, OTR/L  Rehab Admissions Coordinator  [phone] 09/11/2019 11:37 AM  

## 2019-09-11 NOTE — Progress Notes (Signed)
Physical Therapy Treatment Patient Details Name: [name] MRN: [id number] DOB: 01-06-1951 Today's Date: 09/11/2019    History of Present Illness [name] is an 68 y.o. female with medical history significant for rheumatoid arthritis, COPD, seizure disorder, depression with anxiety, and history of TIA, now presenting to the emergency department for evaluation of focal numbness, speech difficulty, and blurred vision.  Patient developed left-sided numbness and weakness several days ago which has progressively worsened and resulted in a fall today.  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    PT Comments    Pt progressing well towards her physical therapy goals, exhibiting improved left sided sensation, activity tolerance and balance. Ambulating 250 feet with a Rollator at a min guard assist level; noted mild left sided gait abnormalities and weakness. Education provided re: DME use, fall prevention techniques including home set up, and level of supervision/assist needed. Pt and pt husband verbalizing understanding. D/c plan updated in light of improvements.     Follow Up Recommendations  Outpatient PT;Supervision/Assistance - 24 hour     Equipment Recommendations  None recommended by PT    Recommendations for Other Services       Precautions / Restrictions Precautions Precautions: Fall Restrictions Weight Bearing Restrictions: No    Mobility  Bed Mobility Overal bed mobility: Needs Assistance Bed Mobility: Supine to Sit;Sit to Supine     Supine to sit: Supervision Sit to supine: Supervision   General bed mobility comments: increased time/effort  Transfers Overall transfer level: Needs assistance Equipment used: 4-wheeled walker Transfers: Sit to/from Stand Sit to Stand: Min guard          General transfer comment: cues for hand placement; locking Rollator prior to transfer.   Ambulation/Gait Ambulation/Gait assistance: Min guard Gait Distance (Feet): 250 Feet Assistive device: 4-wheeled walker Gait Pattern/deviations: Step-through pattern;Decreased step length - left;Decreased dorsiflexion - left Gait velocity: decreased   General Gait Details: Cues for pointing left toes forward, increased left foot clearance   Stairs             Wheelchair Mobility    Modified Rankin (Stroke Patients Only) Modified Rankin (Stroke Patients Only) Pre-Morbid Rankin Score: Slight disability Modified Rankin: Moderately severe disability     Balance Overall balance assessment: Needs assistance Sitting-balance support: No upper extremity supported;Feet supported Sitting balance-Leahy Scale: Fair     Standing balance support: Bilateral upper extremity supported Standing balance-Leahy Scale: Poor                              Cognition Arousal/Alertness: Awake/alert Behavior During Therapy: WFL for tasks assessed/performed Overall Cognitive Status: Impaired/Different from baseline Area of Impairment: Attention;Memory;Following commands;Safety/judgement;Problem solving                   Current Attention Level: Selective Memory: Decreased short-term memory Following Commands: Follows one step commands consistently Safety/Judgement: Decreased awareness of safety   Problem Solving: Slow processing;Difficulty sequencing;Requires verbal cues;Requires tactile cues General Comments: slow processing      Exercises      General Comments        Pertinent Vitals/Pain Pain Assessment: Faces Faces Pain Scale: No hurt    Home Living                      Prior Function  PT Goals (current goals can now be found in the care plan section) Acute Rehab PT Goals Patient Stated Goal: to get back to baseline Potential to Achieve  Goals: Good Progress towards PT goals: Progressing toward goals    Frequency    Min 3X/week      PT Plan Frequency needs to be updated    Co-evaluation              AM-PAC PT "6 Clicks" Mobility   Outcome Measure  Help needed turning from your back to your side while in a flat bed without using bedrails?: None Help needed moving from lying on your back to sitting on the side of a flat bed without using bedrails?: None Help needed moving to and from a bed to a chair (including a wheelchair)?: A Little Help needed standing up from a chair using your arms (e.g., wheelchair or bedside chair)?: A Little Help needed to walk in hospital room?: A Little Help needed climbing 3-5 steps with a railing? : A Lot 6 Click Score: 19    End of Session Equipment Utilized During Treatment: Gait belt Activity Tolerance: Patient tolerated treatment well Patient left: in bed;with call bell/phone within reach;with bed alarm set;with family/visitor present Nurse Communication: Mobility status PT Visit Diagnosis: Unsteadiness on feet (R26.81);Pain;Other symptoms and signs involving the nervous system (R29.898);Difficulty in walking, not elsewhere classified (R26.2) Pain - Right/Left: Right Pain - part of body: Knee     Time: [id number] PT Time Calculation (min) (ACUTE ONLY): 22 min  Charges:  $Gait Training: 8-22 mins                     Ellamae Sia, PT, DPT Acute Rehabilitation Services Pager [phone] Office [phone]    Willy Eddy 09/11/2019, 1:56 PM

## 2019-09-11 NOTE — Care Management Important Message (Signed)
Important Message  Patient Details  Name: [name] MRN: [id number] Date of Birth: 1950/12/19   Medicare Important Message Given:  Yes     [name] 09/11/2019, 2:06 PM

## 2019-09-11 NOTE — Progress Notes (Signed)
Orthopedic Tech Progress Note Patient Details:  [name] 23-May-1951 [id number]  Ortho Devices Type of Ortho Device: Knee Sleeve Ortho Device/Splint Location: RLE Ortho Device/Splint Interventions: Application, Ordered   Post Interventions Patient Tolerated: Well Instructions Provided: Care of device, Poper ambulation with device   Staci Righter 09/11/2019, 2:33 PM

## 2019-09-11 NOTE — Plan of Care (Signed)
Patient stable, discussed POC with patient and spouse, agreeable with plan, denies question/concerns at this time.  

## 2019-09-22 ENCOUNTER — Other Ambulatory Visit: Payer: Self-pay | Admitting: Family Medicine

## 2019-09-22 DIAGNOSIS — G8929 Other chronic pain: Secondary | ICD-10-CM

## 2019-09-23 ENCOUNTER — Telehealth: Payer: Self-pay | Admitting: Neurology

## 2019-09-23 ENCOUNTER — Other Ambulatory Visit: Payer: Self-pay | Admitting: *Deleted

## 2019-09-23 NOTE — Telephone Encounter (Signed)
Dr. Erlinda Hong saw patient in the hosp and wanted the pt to f/u with Dr. Jannifer Franklin in 4 weeks we don't have any apt's until February . Please advise.

## 2019-09-23 NOTE — Patient Outreach (Signed)
[facility]) Care Management  09/23/2019  [name] 05/06/1951 [id number]   RED ON EMMI ALERT - Stroke Day # 9 Date: 09/22/2019 Red Alert Reason: lost interest in things you enjoy   Outreach attempt #1, successful.  Referral received from care management assistant regarding the above noted red EMMI alert.  Call placed to member, she report she has been feeling down and depressed due to her current medical situation.  State she is frustrated with her diagnosis but "dealling with it the best way I can."  She does have home health PT, frustrated because although she is getting a little stronger her legs are still weak and she had another fall.  Encouraged to continue therapy to regain strength.  Verbalizes understanding.  She has not had appointment scheduled with neuro or PCP offices.  Conference call placed to PCP office multiple times, unable to get through, advised to call as soon as possible to schedule appointment.  Conference call placed to Neuro office (Dr. Jannifer Franklin), informed that office tried to contact member twice to schedule appointment without success.  At this time, Dr. Jannifer Franklin does not have availability until February but they will check to see if member can see a practitioner instead.  They will call member back directly, member advised of importance of answering calls.   Plan: This care manager will follow up within the next 2 weeks to verify appointments have been scheduled.  Valente David, [place], MSN [place] [phone]

## 2019-09-26 NOTE — Telephone Encounter (Signed)
Okay to see nurse practitioner, has seen Judson Roch previously.

## 2019-10-06 ENCOUNTER — Other Ambulatory Visit: Payer: Self-pay | Admitting: *Deleted

## 2019-10-06 NOTE — Patient Outreach (Signed)
[facility]) Care Management  10/06/2019  [name] 1951-02-23 [id number]   Call placed to member to follow up on recovery from stroke and follow up appointments.  State she is doing well, will be seen by neurologist on 12/29.  Report she does have transportation for appointment.  She still has not been able to schedule follow up with PCP, but state she would prefer to wait until after she has seen neurologist.  Educated on importance of PCP follow up to manage chronic medical conditions, verbalizes understanding and state she will be sure to make appointment.  Denis any further needs at this time, will close case.  Valente David, [place], MSN [place] [phone]

## 2019-10-19 NOTE — Progress Notes (Signed)
PATIENT: [name] DOB: 10-24-1950  REASON FOR VISIT: follow up HISTORY FROM: patient  HISTORY OF PRESENT ILLNESS: Today 10/20/19  Ms. Eike is a 68 year old female with history of seizures and headaches.  She was recently hospitalized and found to have acute infarction involving the right corona radiata by MRI on 09/08/2019.  Apparently she developed focal numbness, speech difficulty, and blurred vision.  She had left-sided numbness and weakness that progressed over 3-7 days starting in her face, resulting in a fall.  She also had slurred speech.  She was started on aspirin daily (was not on prior), and Plavix combination for 3 weeks, then aspirin alone.  She refused SNF.  CTA head and neck, showed no large vessel occlusion.  LDL was 139, A1c 5.7.  She was started on Lipitor, LDL was 139, goal less than 70.  Since the stroke, reports blurry vision to her left eye, slurred speech, left-sided weakness in her arm and leg.  She has not had any falls, but is being very careful.  She uses a walker for ambulation.  She has not had recurrent seizure. She has chronic right knee pain. She has yet to follow-up with her primary doctor. She does manage her medications, reports she stopped atorvastatin and Plavix (but completed the 3 weeks of dual therapy). She will be starting neuro rehab, speech, physical, and occupational therapy 11/04/2018.  It was recommended she go to a SNF, but she refused.  She presents today for evaluation unaccompanied  HISTORY 05/05/2019 SS: Ms. Nims is a 68 year old female with history of seizures and headaches.  For headaches, she remains on Topamax.  For seizures, she is taking phenobarbital.  Today, she is complaining of a new problem.  She reports for the last few weeks she has had right-sided neck pain, causing her to develop a headache.  She reports she woke up one morning and her neck was stiff.  The pain is on the right side, radiates forward on right side.  She  indicates this feels different from her migraines.  She is not having any numbness or tingling in her arms or legs.  She denies any changes in her bowels or bladder.  She has not fallen recently.  She is not having any new visual disturbances. The right side of her neck is tender to touch, worse with movement to the right side.  She denies any recent illness or fever.  She says she has not had recurrent seizure.  She does not feel that the Topamax is beneficial for her migraines.  She says she has about 2-3 migraines a month.  She presents today for follow-up accompanied by her daughter.  REVIEW OF SYSTEMS: Out of a complete 14 system review of symptoms, the patient complains only of the following symptoms, and all other reviewed systems are negative.  Seizures, stroke  ALLERGIES: Allergies  Allergen Reactions  . Amoxicillin     REACTION: hives  . Aspirin     REACTION: rash  . Codeine Phosphate     REACTION: N/V  . Flexeril [Cyclobenzaprine] Nausea And Vomiting    Dizziness   . Ibuprofen Hives  . Penicillins     REACTION: hives  . Tetanus Toxoids Swelling    HOME MEDICATIONS: Outpatient Medications Prior to Visit  Medication Sig Dispense Refill  . Adalimumab 40 MG/0.4ML PNKT Inject 1 pen into the skin every 7 (seven) days.     Marland Kitchen albuterol (PROVENTIL HFA;VENTOLIN HFA) 108 (90 Base) MCG/ACT inhaler INHALE 2  PUFFS INTO THE LUNGS EVERY 6 HOURS AS NEEDED FOR WHEEZING ORSHORTNESS OF BREATH. (Patient taking differently: Inhale 2 puffs into the lungs every 6 (six) hours as needed for wheezing or shortness of breath. ) 18 g 3  . alendronate (FOSAMAX) 70 MG tablet Take 70 mg by mouth once a week. Fridays    . aspirin EC 81 MG EC tablet Take 1 tablet (81 mg total) by mouth daily. 30 tablet 0  . carvedilol (COREG) 3.125 MG tablet TAKE 1 TABLET BY MOUTH TWICE DAILY WITH A MEAL (Patient taking differently: Take 3.125 mg by mouth 2 (two) times daily with a meal. ) 180 tablet 1  . citalopram (CELEXA)  20 MG tablet TAKE 1 TABLET BY MOUTH ONCE DAILY (Patient taking differently: Take 20 mg by mouth at bedtime. TAKE 1 TABLET BY MOUTH ONCE DAILY) 30 tablet 2  . clotrimazole (LOTRIMIN) 1 % cream Apply 1 application topically 2 (two) times daily. Under breasts 30 g 1  . folic acid (FOLVITE) 1 MG tablet Take 1 mg by mouth.    . gabapentin (NEURONTIN) 300 MG capsule TAKE 3 CAPSULES BY MOUTH TWICE DAILY 180 capsule 0  . HYDROcodone-acetaminophen (NORCO) 10-325 MG tablet Take 1-2 tablets by mouth See admin instructions. 1 tablet in the morning and 2 tablets at night    . omeprazole (PRILOSEC) 20 MG capsule TAKE 1 CAPSULE BY MOUTH ONCE DAILY (Patient taking differently: Take 20 mg by mouth daily. ) 30 capsule 1  . PHENObarbital (LUMINAL) 100 MG tablet Take 1 tablet (100 mg total) by mouth daily. 90 tablet 1  . tiotropium (SPIRIVA HANDIHALER) 18 MCG inhalation capsule INHALE THE CONTENTS OF 1 CAPSULE VIA HANDIHALER BY MOUTH ONCE DAILY (Patient taking differently: Place 18 mcg into inhaler and inhale daily. ) 30 capsule 3  . topiramate (TOPAMAX) 50 MG tablet TAKE 1 TABLET BY MOUTH EVERY MORNING AND2 TABS BY MOUTH AT BEDTIME DAILY (Patient taking differently: Take 50 mg by mouth See admin instructions. 50mg  in the mornings and 100mg  at bedtime) 90 tablet 11  . atorvastatin (LIPITOR) 80 MG tablet Take 1 tablet (80 mg total) by mouth daily at 6 PM. 30 tablet 0  . HYDROcodone-acetaminophen (NORCO/VICODIN) 5-325 MG tablet Take 1 tablet by mouth 3 (three) times daily as needed for moderate pain. (Patient not taking: Reported on 09/08/2019) 90 tablet 0   No facility-administered medications prior to visit.    PAST MEDICAL HISTORY: Past Medical History:  Diagnosis Date  . Asthma   . Chronic low back pain   . Collagen vascular disease ([place])   . 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  . COPD (chronic obstructive pulmonary disease) ([place])   . Depression   . Fibromyalgia    08/18/15   [facility]  . GERD (gastroesophageal reflux disease)   . History of CVA (cerebrovascular accident)    "63 minni srokes"  . Hyperlipidemia   . Hypertension   . Migraine   . Personal history of colonic polyp - adenoma 03/01/2014  . Personality disorder ([place])   . Rheumatoid arthritis ([place])    08/18/15  [facility]  . Seizures ([place])    last seizure 4 years ago  . Sleep apnea    Mild. No need for CPAP.  Marland Kitchen Stroke [facility])     PAST SURGICAL HISTORY: Past Surgical History:  Procedure Laterality Date  . ANKLE SURGERY Left   . APPENDECTOMY    . BACK SURGERY    . BUNIONECTOMY  2 toes  . CARDIAC CATHETERIZATION  2008   Normal. Dr Acie Fredrickson.  . CHOLECYSTECTOMY    . CYST EXCISION     leg  . ESOPHAGOGASTRODUODENOSCOPY (EGD) WITH PROPOFOL N/A 06/27/2018   Procedure: ESOPHAGOGASTRODUODENOSCOPY (EGD) WITH PROPOFOL;  Surgeon: Jonathon Bellows, MD;  Location: [facility] ENDOSCOPY;  Service: Gastroenterology;  Laterality: N/A;  . FLANK MASS EXCISION    . FOOT TENDON SURGERY  june 2014   both feet  . KNEE SURGERY     Bilateral.  . NOSE SURGERY    . SHOULDER SURGERY     bil.  Marland Kitchen TOTAL ABDOMINAL HYSTERECTOMY      FAMILY HISTORY: Family History  Problem Relation Age of Onset  . Cirrhosis Mother   . Diabetes Mother   . Cirrhosis Father   . Breast cancer Sister 4  . Breast cancer Sister 4  . Colon cancer Neg Hx     SOCIAL HISTORY: Social History   Socioeconomic History  . Marital status: Married    Spouse name: Clearence   . Number of children: 1  . Years of education: 33  . Highest education level: Not on file  Occupational History  . Occupation: Unemployed   Tobacco Use  . Smoking status: Former Research scientist (life sciences)  . Smokeless tobacco: Never Used  Substance and Sexual Activity  . Alcohol use: No  . Drug use: No  . Sexual activity: Not on file  Other Topics Concern  . Not on file  Social History Narrative   Patient lives at home with her husband [name].    Patient is currently not working.    Patient  has 1 child.    Patient has a 11th grade education.    Social Determinants of Health   Financial Resource Strain:   . Difficulty of Paying Living Expenses: Not on file  Food Insecurity:   . Worried About Charity fundraiser in the Last Year: Not on file  . Ran Out of Food in the Last Year: Not on file  Transportation Needs:   . Lack of Transportation (Medical): Not on file  . Lack of Transportation (Non-Medical): Not on file  Physical Activity:   . Days of Exercise per Week: Not on file  . Minutes of Exercise per Session: Not on file  Stress:   . Feeling of Stress : Not on file  Social Connections:   . Frequency of Communication with Friends and Family: Not on file  . Frequency of Social Gatherings with Friends and Family: Not on file  . Attends Religious Services: Not on file  . Active Member of Clubs or Organizations: Not on file  . Attends Archivist Meetings: Not on file  . Marital Status: Not on file  Intimate Partner Violence:   . Fear of Current or Ex-Partner: Not on file  . Emotionally Abused: Not on file  . Physically Abused: Not on file  . Sexually Abused: Not on file    PHYSICAL EXAM  Vitals:   10/20/19 1532  BP: 127/87  Pulse: 67  Temp: (!) 97.4 F (36.3 C)  TempSrc: Oral  Weight: 206 lb 6.4 oz (93.6 kg)  Height: 5\' 5"  (1.651 m)   Body mass index is 34.35 kg/m.  Generalized: Well developed, in no acute distress   Neurological examination  Mentation: Alert oriented to time, place, history taking. Follows all commands, mild dysarthria Cranial nerve II-XII: Pupils were equal round reactive to light. Extraocular movements were full, visual field were full on confrontational test.  Facial sensation and strength were normal.  Mild left facial droop to mouth. Head turning and shoulder shrug were normal and symmetric. Motor: Good strength of all extremities, slight weaker left grip, right leg 3/5 hip flexion due to knee pain, right foot weaker  dorsiflexion than left Sensory: Sensory testing is intact to soft touch on all 4 extremities. No evidence of extinction is noted.  Coordination: Cerebellar testing reveals good finger-nose-finger bilaterally Gait and station: Has to push off from seated position, gait is slow, wide-based, cautious, uses rolling walker.  Reflexes: Deep tendon reflexes are symmetric  DIAGNOSTIC DATA (LABS, IMAGING, TESTING) - I reviewed patient records, labs, notes, testing and imaging myself where available.  Lab Results  Component Value Date   WBC 7.9 09/08/2019   HGB 11.6 (L) 09/08/2019   HCT 34.0 (L) 09/08/2019   MCV 83.4 09/08/2019   PLT 212 09/08/2019      Component Value Date/Time   NA 138 09/08/2019 1449   NA 138 05/05/2019 1142   NA 140 09/01/2014 1600   K 4.0 09/08/2019 1449   K 4.3 09/01/2014 1600   CL 108 09/08/2019 1449   CL 111 (H) 09/01/2014 1600   CO2 17 (L) 09/08/2019 1440   CO2 18 (L) 09/01/2014 1600   GLUCOSE 99 09/08/2019 1449   GLUCOSE 78 09/01/2014 1600   BUN 6 (L) 09/08/2019 1449   BUN 10 05/05/2019 1142   BUN 10 09/01/2014 1600   CREATININE 0.50 09/08/2019 1449   CREATININE 0.63 01/24/2015 1449   CALCIUM 8.4 (L) 09/08/2019 1440   CALCIUM 8.3 (L) 09/01/2014 1600   PROT 7.6 09/08/2019 1440   PROT 7.4 05/05/2019 1142   PROT 6.8 08/16/2014 0431   ALBUMIN 3.4 (L) 09/08/2019 1440   ALBUMIN 3.7 (L) 05/05/2019 1142   ALBUMIN 2.7 (L) 08/16/2014 0431   AST 19 09/08/2019 1440   AST 16 08/16/2014 0431   ALT QUANTITY NOT SUFFICIENT, UNABLE TO PERFORM TEST 09/08/2019 1440   ALT 17 08/16/2014 0431   ALKPHOS 97 09/08/2019 1440   ALKPHOS 86 08/16/2014 0431   BILITOT QUANTITY NOT SUFFICIENT, UNABLE TO PERFORM TEST 09/08/2019 1440   BILITOT 0.2 05/05/2019 1142   BILITOT 0.3 08/16/2014 0431   GFRNONAA >60 09/08/2019 1440   GFRNONAA >60 09/01/2014 1600   GFRNONAA >60 07/13/2013 1013   GFRNONAA 84 08/26/2012 1520   GFRAA >60 09/08/2019 1440   GFRAA >60 09/01/2014 1600   GFRAA  >60 07/13/2013 1013   GFRAA >89 08/26/2012 1520   Lab Results  Component Value Date   CHOL 206 (H) 09/09/2019   HDL 49 09/09/2019   LDLCALC 139 (H) 09/09/2019   TRIG 89 09/09/2019   CHOLHDL 4.2 09/09/2019   Lab Results  Component Value Date   HGBA1C 5.7 (H) 09/09/2019   Lab Results  Component Value Date   VITAMINB12 483 09/05/2017   Lab Results  Component Value Date   TSH 2.520 02/11/2014   ASSESSMENT AND PLAN 68 y.o. year old female  has a past medical history of Asthma, Chronic low back pain, Collagen vascular disease ([place]), Conversion disorder with seizures or convulsions, COPD (chronic obstructive pulmonary disease) ([employer]), Depression, Fibromyalgia, GERD (gastroesophageal reflux disease), History of CVA (cerebrovascular accident), Hyperlipidemia, Hypertension, Migraine, Personal history of colonic polyp - adenoma (03/01/2014), Personality disorder ([place]), Rheumatoid arthritis ([place]), Seizures ([place]), Sleep apnea, and Stroke ([place]). here with:  1.  Recent stroke 09/11/2019, right corona radiata infarct into internal capsule/basal ganglia  She suffered a recent stroke presenting with  slurred speech, left-sided weakness, and difficulty walking.  She has complained of left blurry vision since the stroke.  She will be starting therapy, with all 3 disciplines starting in January, it was recommended she be discharged from the hospital to SNF, but she refused.  She will remain on aspirin 81 mg daily, she has completed her 3 weeks of dual therapy with aspirin and Plavix.  I will reorder atorvastatin 80 mg daily for her to continue, as her LDL was 139, goal less than 70.  She will follow-up with her primary doctor for management of vascular risk factors.  I will place a referral to ophthalmology, due to report of continued blurry vision to the left eye.  She will follow-up in 4 months or sooner if needed, at that time can address her chronic issues of seizure and migraine.  On exam, she has mild  left-sided facial droop, mild weakness to left arm, gait instability, and mild slurred speech.   MRI of the brain 09/08/2019 IMPRESSION: 1. Intermittently motion degraded examination as described. 2. 1.1 x 0.8 x 1.8 cm (AP x TV x CC) acute infarct within the right corona radiata, extending inferiorly within the right internal capsule/basal ganglia. 3. There is a background of moderate chronic small vessel ischemic Disease.  CTA head and neck 09/08/2019 IMPRESSION: 1. Negative for large vessel occlusion 2. 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 3. Diminutive vertebrobasilar system in the setting of fetal PCA origins (normal variant). 4. Mild atherosclerosis of the visible aorta.  Echocardiogram 09/09/2019 EF 60-65%  I spent 25 minutes with the patient. 50% of this time was spent discussing her plan of care.  Butler Denmark, AGNP-C, DNP 10/20/2019, 3:44 PM [facility] [address] [place], [place] [address] [phone]

## 2019-10-20 ENCOUNTER — Other Ambulatory Visit: Payer: Self-pay

## 2019-10-20 ENCOUNTER — Telehealth: Payer: Self-pay | Admitting: *Deleted

## 2019-10-20 ENCOUNTER — Ambulatory Visit (INDEPENDENT_AMBULATORY_CARE_PROVIDER_SITE_OTHER): Payer: Medicare Other | Admitting: Neurology

## 2019-10-20 ENCOUNTER — Encounter: Payer: Self-pay | Admitting: Neurology

## 2019-10-20 VITALS — BP 127/87 | HR 67 | Temp 97.4°F | Ht 65.0 in | Wt 206.4 lb

## 2019-10-20 DIAGNOSIS — I639 Cerebral infarction, unspecified: Secondary | ICD-10-CM

## 2019-10-20 MED ORDER — ATORVASTATIN CALCIUM 80 MG PO TABS: 80.0000 mg | ORAL_TABLET | Freq: Every day | ORAL | 5 refills | Status: DC

## 2019-10-20 NOTE — Patient Instructions (Addendum)
Restart atorvastatin 80 mg in the evening  Please see your primary care doctor for hospital follow-up   Keep your appointments for therapy starting in January  Remain on 81 mg aspirin  I will see you back in 4 months or sooner if needed

## 2019-10-20 NOTE — Telephone Encounter (Signed)
Referral faxed to [facility]. Received a receipt of confirmation. Fax # [phone] Ph: [phone].

## 2019-10-21 ENCOUNTER — Other Ambulatory Visit: Payer: Self-pay | Admitting: Family Medicine

## 2019-10-21 DIAGNOSIS — G8929 Other chronic pain: Secondary | ICD-10-CM

## 2019-10-22 NOTE — Progress Notes (Signed)
I have read the note, and I agree with the clinical assessment and plan.  Mckaela Howley K Aliana Kreischer   

## 2019-11-04 ENCOUNTER — Other Ambulatory Visit: Payer: Self-pay | Admitting: Neurology

## 2019-11-05 ENCOUNTER — Ambulatory Visit: Payer: Medicare Other | Admitting: Occupational Therapy

## 2019-11-05 ENCOUNTER — Telehealth: Payer: Self-pay | Admitting: Rehabilitation

## 2019-11-05 ENCOUNTER — Ambulatory Visit: Payer: Medicare Other | Admitting: Speech Pathology

## 2019-11-05 ENCOUNTER — Ambulatory Visit: Payer: Medicare Other | Attending: Internal Medicine | Admitting: Rehabilitation

## 2019-11-05 ENCOUNTER — Encounter: Payer: Self-pay | Admitting: Occupational Therapy

## 2019-11-05 ENCOUNTER — Encounter: Payer: Self-pay | Admitting: Rehabilitation

## 2019-11-05 ENCOUNTER — Other Ambulatory Visit: Payer: Self-pay

## 2019-11-05 DIAGNOSIS — R296 Repeated falls: Secondary | ICD-10-CM

## 2019-11-05 DIAGNOSIS — R41842 Visuospatial deficit: Secondary | ICD-10-CM | POA: Diagnosis present

## 2019-11-05 DIAGNOSIS — R2681 Unsteadiness on feet: Secondary | ICD-10-CM | POA: Insufficient documentation

## 2019-11-05 DIAGNOSIS — I69315 Cognitive social or emotional deficit following cerebral infarction: Secondary | ICD-10-CM

## 2019-11-05 DIAGNOSIS — R41841 Cognitive communication deficit: Secondary | ICD-10-CM | POA: Insufficient documentation

## 2019-11-05 DIAGNOSIS — G8929 Other chronic pain: Secondary | ICD-10-CM | POA: Diagnosis present

## 2019-11-05 DIAGNOSIS — R2689 Other abnormalities of gait and mobility: Secondary | ICD-10-CM

## 2019-11-05 DIAGNOSIS — M25561 Pain in right knee: Secondary | ICD-10-CM | POA: Diagnosis present

## 2019-11-05 DIAGNOSIS — M6281 Muscle weakness (generalized): Secondary | ICD-10-CM

## 2019-11-05 DIAGNOSIS — R471 Dysarthria and anarthria: Secondary | ICD-10-CM

## 2019-11-05 DIAGNOSIS — R209 Unspecified disturbances of skin sensation: Secondary | ICD-10-CM | POA: Diagnosis present

## 2019-11-05 DIAGNOSIS — R208 Other disturbances of skin sensation: Secondary | ICD-10-CM

## 2019-11-05 DIAGNOSIS — I69354 Hemiplegia and hemiparesis following cerebral infarction affecting left non-dominant side: Secondary | ICD-10-CM

## 2019-11-05 NOTE — Telephone Encounter (Signed)
Judson Roch,   I just evaluated Jerilee Field here at [facility] for PT.  She would greatly benefit from a bedside commode (3-in-1) in order to assist with shower and commode transfers.  Please write order in epic for this and I can provide to her at next visit.    Thanks so much Cameron Sprang, PT, MPT [facility] [address] [place], [place], [address] Phone: [phone]   Fax:  [phone] 11/05/19, 4:48 PM

## 2019-11-05 NOTE — Therapy (Signed)
[facility] [address] [place], [place], [address] Phone: [phone]   Fax:  [phone]  Occupational Therapy Evaluation  Patient Details  Name: [name] MRN: [id number] Date of Birth: 1951/01/02 No data recorded  Encounter Date: 11/05/2019  OT End of Session - 11/05/19 1852    Visit Number  1    Number of Visits  17    Date for OT Re-Evaluation  01/19/20    Authorization Type  Medicare/Medicaid    Authorization Time Period  10th visit progress note    Authorization - Visit Number  1    Authorization - Number of Visits  10    OT Start Time  1703    OT Stop Time  1745    OT Time Calculation (min)  42 min    Activity Tolerance  Patient limited by pain    Behavior During Therapy  --   emotional - tearful      Past Medical History:  Diagnosis Date  . Asthma   . Chronic low back pain   . Collagen vascular disease ([place])   . 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  . COPD (chronic obstructive pulmonary disease) ([place])   . Depression   . Fibromyalgia    08/18/15  [facility]  . GERD (gastroesophageal reflux disease)   . History of CVA (cerebrovascular accident)    "44 minni srokes"  . Hyperlipidemia   . Hypertension   . Migraine   . Personal history of colonic polyp - adenoma 03/01/2014  . Personality disorder ([place])   . Rheumatoid arthritis ([place])    08/18/15  [facility]  . Seizures ([place])    last seizure 4 years ago  . Sleep apnea    Mild. No need for CPAP.  Marland Kitchen Stroke [facility])     Past Surgical History:  Procedure Laterality Date  . ANKLE SURGERY Left   . APPENDECTOMY    . BACK SURGERY    . BUNIONECTOMY     2 toes  . CARDIAC CATHETERIZATION  2008   Normal. Dr Acie Fredrickson.  . CHOLECYSTECTOMY    . CYST EXCISION     leg  . ESOPHAGOGASTRODUODENOSCOPY (EGD) WITH PROPOFOL N/A 06/27/2018   Procedure: ESOPHAGOGASTRODUODENOSCOPY (EGD) WITH PROPOFOL;  Surgeon: Jonathon Bellows, MD;  Location:  [facility] ENDOSCOPY;  Service: Gastroenterology;  Laterality: N/A;  . FLANK MASS EXCISION    . FOOT TENDON SURGERY  june 2014   both feet  . KNEE SURGERY     Bilateral.  . NOSE SURGERY    . SHOULDER SURGERY     bil.  Marland Kitchen TOTAL ABDOMINAL HYSTERECTOMY      There were no vitals filed for this visit.  Subjective Assessment - 11/05/19 1707    Subjective   I love to cook    Currently in Pain?  Yes    Pain Score  9     Pain Location  Leg    Pain Orientation  Right;Left    Pain Descriptors / Indicators  Throbbing;Sharp    Pain Type  Chronic pain    Pain Onset  More than a month ago    Aggravating Factors   Standing, walking    Pain Relieving Factors  took pain pill before therapy        [facility] OT Assessment - 11/05/19 1709      Assessment   Medical Diagnosis  R CVA    Onset Date/Surgical Date  09/08/19  Hand Dominance  Right    Prior Therapy  acute PT      Precautions   Precautions  Fall      Restrictions   Weight Bearing Restrictions  No      Balance Screen   Has the patient fallen in the past 6 months  Yes      Prior Function   Level of Independence  Independent with basic ADLs    Vocation  On disability   Seizures   Leisure  cook, clean      ADL   Eating/Feeding  Independent    Grooming  Independent    Upper Body Bathing  Minimal assistance    Lower Body Bathing  Modified independent    Upper Body Dressing  Supervision/safety    Lower Body Dressing  Supervision/safety    Toilet Transfer  Moderate assistance    Toileting - Clothing Manipulation  Modified independent    Tub/Shower Transfer  Minimal assistance      IADL   Prior Level of Function Shopping  Independent    Shopping  Completely unable to shop    Prior Level of Function Light Housekeeping  Independent    Light Housekeeping  Does not participate in any housekeeping tasks    Prior Level of Function Meal Prep  Indpendent    Meal Prep  Needs to have meals prepared and served    Prior Level of Function  Sales executive  Relies on family or friends for transportation    Medication Management  Has difficulty remembering to take medication    Financial Management  Requires assistance      Written Expression   Dominant Hand  Right      Vision - History   Baseline Vision  Wears glasses all the time    Visual History  Cataracts      Vision Assessment   Eye Alignment  Within Functional Limits    Ocular Range of Motion  Restricted looking down    Alignment/Gaze Preference  Within Defined Limits    Tracking/Visual Pursuits  Able to track stimulus in all quads without difficulty    Saccades  Additional eye shifts occurred during testing    Acuity  Visual acuity left eye (comment)    Comment  Patient normally wears glasses and did not bring them to session      Activity Tolerance   Activity Tolerance Comments  Impaired due to COPD, pain      Cognition   Overall Cognitive Status  Impaired/Different from baseline    Area of Impairment  Attention;Memory    Current Attention Level  Selective    Cognition Comments  Patient reports decreased memory, and displays decreased attention during eval.  Very tearful this session      Posture/Postural Control   Posture/Postural Control  Postural limitations    Postural Limitations  Rounded Shoulders;Forward head      Sensation   Light Touch  Impaired by gross assessment    Light Touch Impaired Details  Impaired LUE    Stereognosis  Appears Intact    Hot/Cold  Impaired by gross assessment    Proprioception  Appears Intact      Coordination   Gross Motor Movements are Fluid and Coordinated  No    Fine Motor Movements are Fluid and Coordinated  No    9 Hole Peg Test  Right;Left    Right 9 Hole Peg Test  29.03  Left 9 Hole Peg Test  30.37      ROM / Strength   AROM / PROM / Strength  AROM;Strength      AROM   Overall AROM   Deficits    AROM Assessment Site  Shoulder    Right/Left Shoulder  Left     Left Shoulder Flexion  80 Degrees    Left Shoulder ABduction  75 Degrees      Strength   Overall Strength  Deficits    Overall Strength Comments  left shoulder shoulder flex left 3+/5      Hand Function   Right Hand Gross Grasp  Impaired    Right Hand Grip (lbs)  25    Right Hand Lateral Pinch  10 lbs    Left Hand Gross Grasp  Impaired    Left Hand Grip (lbs)  10    Left Hand Lateral Pinch  6 lbs                      OT Education - 11/05/19 1851    Education Details  Reviewed eval results and potential goals, plan of care    Person(s) Educated  Patient    Methods  Explanation    Comprehension  Verbalized understanding;Need further instruction       OT Short Term Goals - 11/05/19 1859      OT SHORT TERM GOAL #1   Title  Patient will complete an HEP for BUE to improve shoulder motion to aide with dressing, bathing tasks    Time  4    Period  Weeks    Status  New    Target Date  12/15/19      OT SHORT TERM GOAL #2   Title  Patient will complete an HEP designed to improve hand strength to aide with opening jars, bottles, etc.    Time  4    Period  Weeks    Status  New      OT SHORT TERM GOAL #3   Title  Patient will prepare herself a simple snack with minimall assistance    Time  4    Period  Weeks    Status  New      OT SHORT TERM GOAL #4   Title  Patient will transfer to/from toilet with minima assistance    Time  4    Period  Weeks    Status  New        OT Long Term Goals - 11/05/19 1902      OT LONG TERM GOAL #1   Title  Patient will complete update HEP independently    Time  8    Period  Weeks    Status  New    Target Date  01/19/20      OT LONG TERM GOAL #2   Title  Patient will demonstrate at least 5 lb grip strength improvement in LUE to assist with opening jars and bottles    Time  8    Period  Weeks    Status  New      OT LONG TERM GOAL #3   Title  Patient will demonstrate the ability to hold food item, e.g. cup in left hand  for greater than 15 seconds without dropping    Time  8    Period  Weeks    Status  New      OT LONG TERM GOAL #4   Title  Patient  will prepare a simple meal for two, consisting of at least two items with no more than occasional min assist    Time  8    Period  Weeks    Status  New            Plan - 11/05/19 1853    Clinical Impression Statement  Patient is a 69 year old woman RA, COPD, and with history of seizures and headaches, managed by Dr [name], who on 09/08/19 was hospitalized and found to have a stroke - corona radiata.  Patient with resultant left sided weakness, numbenss, decreased vision which impede performance of basic ADL's/IADL's. Patient will benefit from skilled OT intervention to improve her ability to participate in ADL's/IADL's and improve safety with functional mobility.  Patient with report of multiple falls at home.  Patient has refused SNF placement fromhospitalization.    OT Occupational Profile and History  Detailed Assessment- Review of Records and additional review of physical, cognitive, psychosocial history related to current functional performance    Occupational performance deficits (Please refer to evaluation for details):  ADL's;IADL's;Leisure;Social Participation;Rest and Sleep    Body Structure / Function / Physical Skills  ADL;Coordination;Endurance;GMC;UE functional use;Balance;Decreased knowledge of precautions;Obesity;Sensation;Vision;Pain;IADL;Flexibility;Decreased knowledge of use of DME;Body mechanics;Cardiopulmonary status limiting activity;Dexterity;FMC;Strength;Tone;ROM;Mobility;Edema    Cognitive Skills  Attention;Emotional;Energy/Drive;Memory    Psychosocial Skills  Coping Strategies    Rehab Potential  Good    Clinical Decision Making  Several treatment options, min-mod task modification necessary    Comorbidities Affecting Occupational Performance:  May have comorbidities impacting occupational performance   COPD, RA   Modification  or Assistance to Complete Evaluation   Min-Moderate modification of tasks or assist with assess necessary to complete eval    OT Frequency  2x / week    OT Duration  8 weeks    OT Treatment/Interventions  Self-care/ADL training;Therapeutic exercise;Visual/perceptual remediation/compensation;Coping strategies training;Patient/family education;Splinting;Neuromuscular education;Moist Heat;Fluidtherapy;Functional Mobility Training;Energy conservation;Therapeutic activities;Balance training;Cognitive remediation/compensation;Manual Therapy;DME and/or AE instruction    Plan  Begin HEP for hand strength, supine shoulder exercise, review use of 3N1    Consulted and Agree with Plan of Care  Patient       Patient will benefit from skilled therapeutic intervention in order to improve the following deficits and impairments:   Body Structure / Function / Physical Skills: ADL, Coordination, Endurance, GMC, UE functional use, Balance, Decreased knowledge of precautions, Obesity, Sensation, Vision, Pain, IADL, Flexibility, Decreased knowledge of use of DME, Body mechanics, Cardiopulmonary status limiting activity, Dexterity, FMC, Strength, Tone, ROM, Mobility, Edema Cognitive Skills: Attention, Emotional, Energy/Drive, Memory Psychosocial Skills: Coping Strategies   Visit Diagnosis: Other disturbances of skin sensation - Plan: Ot plan of care cert/re-cert, Ot plan of care cert/re-cert  Muscle weakness (generalized) - Plan: Ot plan of care cert/re-cert, Ot plan of care cert/re-cert  Hemiplegia and hemiparesis following cerebral infarction affecting left non-dominant side ([place]) - Plan: Ot plan of care cert/re-cert, Ot plan of care cert/re-cert  Visuospatial deficit - Plan: Ot plan of care cert/re-cert, Ot plan of care cert/re-cert  Cognitive social or emotional deficit following cerebral infarction - Plan: Ot plan of care cert/re-cert, Ot plan of care cert/re-cert    Problem List Patient Active Problem  List   Diagnosis Date Noted  . CVA (cerebral vascular accident) ([place]) 09/09/2019  . Acute ischemic stroke ([place]) 09/08/2019  . Ear pain, bilateral 03/30/2019  . Coronary artery disease 10/16/2018  . Nasal congestion 08/15/2017  . Hypersomnia with sleep apnea 04/18/2017  . Snoring 04/18/2017  .  Post traumatic stress disorder (PTSD) 04/18/2017  . COPD mixed type ([place]) 04/18/2017  . Somnolence, daytime 02/26/2017  . Cervical paraspinal muscle spasm 10/26/2016  . Lower back pain 05/27/2016  . Left hip pain 05/27/2016  . Fibromyalgia 08/18/2015  . Rheumatoid arthritis ([place]) 03/09/2015  . Essential hypertension, benign 02/18/2015  . Right knee pain 02/18/2015  . Cervical disc disorder with radiculopathy of cervical region 01/21/2015  . Bilateral arm pain 01/13/2015  . After cataract 07/22/2014  . Personal history of colonic polyp - adenoma 03/01/2014  . Candidal intertrigo 12/29/2013  . Dyshidrotic eczema 12/29/2013  . Routine adult health maintenance 12/29/2013  . Generalized convulsive epilepsy ([place]) 05/27/2013  . Frequent falls 02/10/2013  . Generalized pain 08/30/2012  . Neck pain 02/21/2012  . STROKE 07/29/2008  . Seizures ([place]) 07/29/2008  . Migraine without aura 07/29/2007  . DEPENDENCE, BARB/SED, CONTINUOUS 06/24/2007  . HYPERLIPIDEMIA 12/19/2006  . OBESITY, NOS 12/19/2006  . Depression with anxiety 12/19/2006  . PANIC ATTACKS 12/19/2006  . 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 BORDERLINE PERSONALITY 12/19/2006  . COPD 12/19/2006  . REFLUX ESOPHAGITIS 12/19/2006    Mariah Milling, OTR/L 11/05/2019, 7:10 PM  [facility] [address] [place], [place], [address] Phone: [phone]   Fax:  [phone]  Name: [name] MRN: [id number] Date of Birth: [birth year]

## 2019-11-05 NOTE — Patient Instructions (Signed)
Any time you go to the store, make a list before you go.    Memory Strategies  W - Write it down  A - Associate it with something  R - Repeat it  M - Mental Image   Play the memory game  Try to remember 3-5 items on your store list without looking  Study a detailed picture in a magazine for 1 minute, then write down everything you can remember from the picture

## 2019-11-05 NOTE — Therapy (Signed)
[facility] [address] [place], [place], [address] Phone: [phone]   Fax:  [phone]  Physical Therapy Evaluation  Patient Details  Name: [name] MRN: [id number] Date of Birth: [birth year] Referring Provider (PT): Butler Denmark, NP   Encounter Date: 11/05/2019  PT End of Session - 11/05/19 1623    Visit Number  1    Number of Visits  17    Date for PT Re-Evaluation  [id number]   cert for 90 days with plan to see for 60 day POC   Authorization Type  Medicare/Medicaid-Needs 10th visit progress note    PT Start Time  1532    PT Stop Time  1615    PT Time Calculation (min)  43 min    Activity Tolerance  Patient tolerated treatment well    Behavior During Therapy  [facility] for tasks assessed/performed       Past Medical History:  Diagnosis Date  . Asthma   . Chronic low back pain   . Collagen vascular disease ([place])   . 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  . COPD (chronic obstructive pulmonary disease) ([place])   . Depression   . Fibromyalgia    08/18/15  [facility]  . GERD (gastroesophageal reflux disease)   . History of CVA (cerebrovascular accident)    "13 minni srokes"  . Hyperlipidemia   . Hypertension   . Migraine   . Personal history of colonic polyp - adenoma 03/01/2014  . Personality disorder ([place])   . Rheumatoid arthritis ([place])    08/18/15  [facility]  . Seizures ([place])    last seizure 4 years ago  . Sleep apnea    Mild. No need for CPAP.  Marland Kitchen Stroke [facility])     Past Surgical History:  Procedure Laterality Date  . ANKLE SURGERY Left   . APPENDECTOMY    . BACK SURGERY    . BUNIONECTOMY     2 toes  . CARDIAC CATHETERIZATION  2008   Normal. Dr Acie Fredrickson.  . CHOLECYSTECTOMY    . CYST EXCISION     leg  . ESOPHAGOGASTRODUODENOSCOPY (EGD) WITH PROPOFOL N/A 06/27/2018   Procedure: ESOPHAGOGASTRODUODENOSCOPY (EGD) WITH PROPOFOL;  Surgeon: Jonathon Bellows, MD;  Location: [facility] ENDOSCOPY;   Service: Gastroenterology;  Laterality: N/A;  . FLANK MASS EXCISION    . FOOT TENDON SURGERY  june 2014   both feet  . KNEE SURGERY     Bilateral.  . NOSE SURGERY    . SHOULDER SURGERY     bil.  Marland Kitchen TOTAL ABDOMINAL HYSTERECTOMY      There were no vitals filed for this visit.   Subjective Assessment - 11/05/19 1538    Subjective  "They said I needed to get Physical Therapy. I think I need it for my left side."    Pertinent History  Chronic R knee pain, seizures, RA, COPD, anxiety/depression    Limitations  Standing;House hold activities;Walking    How long can you stand comfortably?  less than 5 mins    How long can you walk comfortably?  has a hard time walking for any length of time    Patient Stated Goals  "I want to get back like I was."    Currently in Pain?  Yes   took pain meds prior to coming   Pain Score  10-Worst pain ever   not today, but can get this high   Pain Location  Knee  Pain Orientation  Right    Pain Descriptors / Indicators  Aching    Pain Type  Chronic pain    Pain Onset  More than a month ago    Pain Frequency  Constant    Aggravating Factors   standing, walking    Pain Relieving Factors  ice, pain medication decrease pain         [facility] PT Assessment - 11/05/19 1546      Assessment   Medical Diagnosis  R CVA    Referring Provider (PT)  Butler Denmark, NP    Onset Date/Surgical Date  09/08/19    Hand Dominance  Right      Precautions   Precautions  Fall      Restrictions   Weight Bearing Restrictions  No      Balance Screen   Has the patient fallen in the past 6 months  Yes    How many times?  2-3    Has the patient had a decrease in activity level because of a fear of falling?   Yes    Is the patient reluctant to leave their home because of a fear of falling?   Yes      [place] residence    Living Arrangements  Spouse/significant other;Children;Other relatives    Available Help at Discharge   Family;Available PRN/intermittently    Type of [place]  One level    [place] - 4 wheels;Electric scooter;Grab bars - tub/shower      Prior Function   Level of Independence  Requires assistive device for independence;Needs assistance with ADLs    Leisure  shopping       Cognition   Overall Cognitive Status  Impaired/Different from baseline    Area of Impairment  Memory      Sensation   Light Touch  Impaired Detail    Light Touch Impaired Details  Impaired LUE;Impaired LLE    Hot/Cold  Impaired Detail    Hot/Cold Impaired Details  Impaired LUE      Coordination   Gross Motor Movements are Fluid and Coordinated  No    Fine Motor Movements are Fluid and Coordinated  No    Coordination and Movement Description  Decreased strength in LLE    Heel Shin Test  Decreased due to weakness in LLE      ROM / Strength   AROM / PROM / Strength  Strength      Strength   Overall Strength  Deficits    Overall Strength Comments  R hip flex 2/5 (painful), L hip flex 3-/5, R knee ext (2/5 (painful), L knee ext 3+/5, R knee flex 3/5 (painful), L knee flex 4/5, R ankle DF 4/5, L ankle DF 3/5.      Transfers   Transfers  Sit to Stand;Stand to Sit    Sit to Stand  5: Supervision;4: Min guard    Sit to Stand Details  Verbal cues for sequencing;Verbal cues for technique;Tactile cues for weight shifting    Sit to Stand Details (indicate cue type and reason)  Pt initially standing straight up with max difficulty, however with light verbal and tactile cue for forward weight shift, she was able to do remaining at S level.     Five time sit to stand comments   41.65 secs with BUE support  Stand to Sit  5: Supervision    Stand to Sit Details (indicate cue type and reason)  Verbal cues for sequencing;Verbal cues for technique      Ambulation/Gait   Ambulation/Gait  Yes    Ambulation/Gait Assistance  4: Min guard    Ambulation Distance  (Feet)  115 Feet   broken into 2 bouts with seated rest break for TUG.    Assistive device  4-wheeled walker    Gait Pattern  Step-through pattern;Decreased stride length;Decreased dorsiflexion - left;Right flexed knee in stance;Left flexed knee in stance;Trunk flexed   elevated shoulders    Ambulation Surface  Level;Indoor    Gait velocity  17.84 secs=1.84 ft/sec with rollator at min/guard level       Standardized Balance Assessment   Standardized Balance Assessment  Timed Up and Go Test      Timed Up and Go Test   TUG  Normal TUG    Normal TUG (seconds)  32.47   with rollator               Objective measurements completed on examination: See above findings.              PT Education - 11/05/19 1622    Education Details  Education on evaluation results, POC, goals, also provided education that PT would request order for bedside commode to use in shower and over commode.    Person(s) Educated  Patient    Methods  Explanation    Comprehension  Verbalized understanding       PT Short Term Goals - 11/05/19 1636      PT SHORT TERM GOAL #1   Title  Pt will perform HEP consistently in order to indicate improved balance and functional mobility.  (Target Date:12/05/19)    Time  4    Period  Weeks    Status  New    Target Date  12/05/19      PT SHORT TERM GOAL #2   Title  Pt will improve gait speed to 2.44 ft/sec with LRAD in order to indicate decreased fall risk.    Status  New      PT SHORT TERM GOAL #3   Title  Pt will improve TUG to </=28 secs w/ LRAD in order to indicate decreased fall risk.    Status  New      PT SHORT TERM GOAL #4   Title  Pt will improve 5TSS to </=35 secs with single UE support from standard arm chair in order to indicate improved functional strength.      PT SHORT TERM GOAL #5   Title  Will perform BERG balance and improve score by 4 points in order to indicate decreased fall risk.      Additional Short Term Goals   Additional  Short Term Goals  Yes      PT SHORT TERM GOAL #6   Title  Will assess bed mobility from simulated bed height so that pt performs at a mod I level to indicate safety at home.        PT Long Term Goals - 11/05/19 1641      PT LONG TERM GOAL #1   Title  Pt will be independent with final HEP in order to indicate decreased fall risk and improved functional mobility.  (Target Date:01/04/20)    Time  8    Period  Weeks    Status  New    Target Date  01/04/20  PT LONG TERM GOAL #2   Title  Pt will improve 5TSS to </=30 secs with single UE support from standard arm chair in order to indicate improved functional mobility.      PT LONG TERM GOAL #3   Title  Pt will improve TUG to </=24 secs w/ LRAD in order to indicate decreased fall risk.      PT LONG TERM GOAL #4   Title  Pt will improve gait speed to >/=2.62 ft/sec in order to indicate safe community ambulation.      PT LONG TERM GOAL #5   Title  Pt will improve BERG balance score by 8 points from baseline in order to indicate decreased fall risk.      Additional Long Term Goals   Additional Long Term Goals  Yes      PT LONG TERM GOAL #6   Title  Pt will ambulate over unlevel paved outdoor surfaces x 500' w/ LRAD at S level in order to indicate safe community negotiation.             Plan - 11/05/19 1625    Clinical Impression Statement  Pt presents s/p R carona radiata CVA on 09/08/19 with residual L sided weakness, decreased sensation, and decreased memory with decreased balance.  Note history of R knee pain (she reports she needs replacement), seizures, RA, COPD and anxiety/depression.  Upon PT evaluation, pt tearful about situation and also very fearful of falling.  Note R sided weakness due to R knee pain, but also L LE weakness, 5TSS time of 41.65 secs with BUE support, indicative of high fall risk, gait speed of 1.84 ft/sec indicative of high fall risk, and TUG time of 32.47 secs, also indicative of fall risk.  Pt will  benefit from skilled OP neuro PT in order to address deficits.    Personal Factors and Comorbidities  Age;Comorbidity 3+    Comorbidities  see above    Examination-Activity Limitations  Bathing;Bed Mobility;Dressing;Hygiene/Grooming;Stand;Stairs;Squat;Locomotion Level    Examination-Participation Restrictions  Cleaning;Community Activity;Laundry;Meal Prep    Stability/Clinical Decision Making  Evolving/Moderate complexity    Clinical Decision Making  Moderate    Rehab Potential  Good    PT Frequency  2x / week    PT Duration  8 weeks    PT Treatment/Interventions  ADLs/Self Care Home Management;Aquatic Therapy;Electrical Stimulation;DME Instruction;Gait training;Stair training;Functional mobility training;Therapeutic activities;Therapeutic exercise;Balance training;Neuromuscular re-education;Cognitive remediation;Patient/family education;Passive range of motion;Dry needling;Energy conservation;Vestibular    PT Next Visit Plan  BERG, check to see if order for bedside commode came in and provide to pt, I also wonder if she would be safer in home with a standard RW??, assess getting in/out of bed (from simulated height) to assess safety/technique, provide HEP for BLE strengthening, standing balance, endurance    Consulted and Agree with Plan of Care  Patient       Patient will benefit from skilled therapeutic intervention in order to improve the following deficits and impairments:  Abnormal gait, Cardiopulmonary status limiting activity, Decreased activity tolerance, Decreased balance, Decreased cognition, Decreased coordination, Decreased endurance, Decreased knowledge of precautions, Decreased knowledge of use of DME, Decreased mobility, Decreased strength, Difficulty walking, Impaired perceived functional ability, Impaired flexibility, Impaired sensation, Postural dysfunction, Improper body mechanics, Impaired vision/preception  Visit Diagnosis: Hemiplegia and hemiparesis following cerebral  infarction affecting left non-dominant side (HCC)  Muscle weakness (generalized)  Unsteadiness on feet  Chronic pain of right knee  Other abnormalities of gait and mobility     Problem List  Patient Active Problem List   Diagnosis Date Noted  . CVA (cerebral vascular accident) ([place]) 09/09/2019  . Acute ischemic stroke ([place]) 09/08/2019  . Ear pain, bilateral 03/30/2019  . Coronary artery disease 10/16/2018  . Nasal congestion 08/15/2017  . Hypersomnia with sleep apnea 04/18/2017  . Snoring 04/18/2017  . Post traumatic stress disorder (PTSD) 04/18/2017  . COPD mixed type ([place]) 04/18/2017  . Somnolence, daytime 02/26/2017  . Cervical paraspinal muscle spasm 10/26/2016  . Lower back pain 05/27/2016  . Left hip pain 05/27/2016  . Fibromyalgia 08/18/2015  . Rheumatoid arthritis ([place]) 03/09/2015  . Essential hypertension, benign 02/18/2015  . Right knee pain 02/18/2015  . Cervical disc disorder with radiculopathy of cervical region 01/21/2015  . Bilateral arm pain 01/13/2015  . After cataract 07/22/2014  . Personal history of colonic polyp - adenoma 03/01/2014  . [place] intertrigo 12/29/2013  . Dyshidrotic eczema 12/29/2013  . Routine adult health maintenance 12/29/2013  . Generalized convulsive epilepsy ([place]) 05/27/2013  . Frequent falls 02/10/2013  . Generalized pain 08/30/2012  . Neck pain 02/21/2012  . STROKE 07/29/2008  . Seizures ([place]) 07/29/2008  . Migraine without aura 07/29/2007  . DEPENDENCE, BARB/SED, CONTINUOUS 06/24/2007  . HYPERLIPIDEMIA 12/19/2006  . OBESITY, NOS 12/19/2006  . Depression with anxiety 12/19/2006  . PANIC ATTACKS 12/19/2006  . 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 BORDERLINE PERSONALITY 12/19/2006  . COPD 12/19/2006  . REFLUX ESOPHAGITIS 12/19/2006   Cameron Sprang, PT, MPT [facility] [address] [place], [place], [address] Phone: [phone]   Fax:  [phone] 11/05/19, 4:45 PM  Name:  [name] MRN: [id number] Date of Birth: 1951/01/15

## 2019-11-05 NOTE — Telephone Encounter (Signed)
[name], I placed the order, if not done correctly, will fix, let me know. Thanks so much

## 2019-11-09 NOTE — Therapy (Signed)
[facility] [address] [place], [place], [address] Phone: [phone]   Fax:  [phone]  Speech Language Pathology Evaluation  Patient Details  Name: [name] MRN: [id number] Date of Birth: 04-Sep-1951 Referring Provider (SLP): Eulogio Bear, DO/ Butler Denmark, NP   Encounter Date: 11/05/2019  End of Session - 11/05/19 1615   Visit Number  1    Number of Visits  17    Date for SLP Re-Evaluation 02/03/20    Authorization Type  Medicare, Medicaid secondary    Authorization Time Period  Need 10th visit PN    SLP Start Time  1615    SLP Stop Time   1700    SLP Time Calculation (min)  45 min    Activity Tolerance  Patient tolerated treatment well;Other (comment)   pt limited by frustration      Past Medical History:  Diagnosis Date  . Asthma   . Chronic low back pain   . Collagen vascular disease ([place])   . 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  . COPD (chronic obstructive pulmonary disease) ([place])   . Depression   . Fibromyalgia    08/18/15  [facility]  . GERD (gastroesophageal reflux disease)   . History of CVA (cerebrovascular accident)    "20 minni srokes"  . Hyperlipidemia   . Hypertension   . Migraine   . Personal history of colonic polyp - adenoma 03/01/2014  . Personality disorder ([place])   . Rheumatoid arthritis ([place])    08/18/15  [facility]  . Seizures ([place])    last seizure 4 years ago  . Sleep apnea    Mild. No need for CPAP.  Marland Kitchen Stroke [facility])     Past Surgical History:  Procedure Laterality Date  . ANKLE SURGERY Left   . APPENDECTOMY    . BACK SURGERY    . BUNIONECTOMY     2 toes  . CARDIAC CATHETERIZATION  2008   Normal. Dr Acie Fredrickson.  . CHOLECYSTECTOMY    . CYST EXCISION     leg  . ESOPHAGOGASTRODUODENOSCOPY (EGD) WITH PROPOFOL N/A 06/27/2018   Procedure: ESOPHAGOGASTRODUODENOSCOPY (EGD) WITH PROPOFOL;  Surgeon: Jonathon Bellows, MD;  Location: [facility] ENDOSCOPY;  Service:  Gastroenterology;  Laterality: N/A;  . FLANK MASS EXCISION    . FOOT TENDON SURGERY  june 2014   both feet  . KNEE SURGERY     Bilateral.  . NOSE SURGERY    . SHOULDER SURGERY     bil.  Marland Kitchen TOTAL ABDOMINAL HYSTERECTOMY      There were no vitals filed for this visit.  Subjective Assessment - 11/05/19 1615    Subjective  "My speech was messed up after the stroke, but I'm okay now. I get frustrated because I can't think."    Currently in Pain?  Yes    Pain Score  9     Pain Location  Leg    Pain Orientation  Right;Left         SLP Evaluation [facility] - 11/05/19 1615     SLP Visit Information   SLP Received On  11/04/18    Referring Provider (SLP)  Eulogio Bear, DO/ Butler Denmark, NP    Onset Date  09/08/19    Medical Diagnosis  CVA      Subjective   Subjective  "I'm used to doing these things by myself." (buying groceries, paying bills)    Patient/Family Stated Goal  Improve thinking, be more independent  Pain Assessment   Pain Type  Chronic pain    Pain Relieving Factors  took pain pill before coming to therapy      General Information   HPI  Patient is a 69 y.o female with PMHx noted for conversion disorder with seizures or convulsions, depression, fibromyalgia, GERD, Personality disorder, referred for speech therapy evaluation due to dysarthria/cognitive deficits s/p CVA on 09/08/19. MRI showed infarction in right corona radiata.    Behavioral/Cognition  tearful at times    Mobility Status  uses rollator      Balance Screen   Has the patient fallen in the past 6 months  Yes   see PT eval     Prior Functional Status   Cognitive/Linguistic Baseline  Baseline deficits    Baseline deficit details  SLP on acute questioned baseline function   hx PTSD, personality disorder, conversion disorder   Type of Home  House     Lives With  Spouse    Available Support  Family    Education  reports criminal justice degree      Cognition   Overall Cognitive Status   Impaired/Different from baseline    Area of Impairment  Attention;Memory    Current Attention Level  Selective    Attention Comments  distracted by phone page, pain    Memory  Decreased short-term memory    Attention  Selective;Alternating   decreased attention to detail   Selective Attention  --   reports taking handful of $100s to store instead of $5 bills   Alternating Attention  Impaired    Alternating Attention Impairment  --   self-corrected error on trailmaking   Memory  Impaired    Memory Impairment  Decreased short term memory   family tell her she repeats herself, asks the same questions   Decreased Short Term Memory  Verbal basic   delayed recall 2/5   Awareness  Impaired    Awareness Impairment  Emergent impairment;Anticipatory impairment    Problem Solving  Impaired    Problem Solving Impairment  Verbal basic   very frustrated, tearful with serial subtraction,simple math   Executive Function  --   impaired due to lower level deficits     Auditory Comprehension   Overall Auditory Comprehension  Appears within functional limits for tasks assessed   requests repetition occasionally (attention)     Visual Recognition/Discrimination   Discrimination  Not tested      Reading Comprehension   Reading Status  Unable to assess (comment)   pt did not have glasses     Expression   Primary Mode of Expression  Verbal      Verbal Expression   Overall Verbal Expression  Appears within functional limits for tasks assessed      Written Expression   Dominant Hand  Right    Written Expression  Not tested      Oral Motor/Sensory Function   Overall Oral Motor/Sensory Function  Other (comment)   mild L facial droop     Motor Speech   Overall Motor Speech  Appears within functional limits for tasks assessed   patient reports her speech at baseline     Standardized Assessments   Standardized Assessments   Montreal Cognitive Assessment (MOCA)   15/30 on form 7.1   Montreal  Cognitive Assessment (MOCA)   Visuospatial/executive 2/5, Naming 3/3, Attention 2/6, Language 0/3 (attention contributes), Delayed recall 2/5, orientation 6/6  SLP Education - 11/05/19 1615   Education Details  memory compensation strategies    Person(s) Educated  Patient    Methods  Explanation;Handout    Comprehension  Verbalized understanding       SLP Short Term Goals - 11/05/19 1615     SLP SHORT TERM GOAL #1   Title  Patient will use 2 memory strategies in or between 2 speech therapy sessions.    Time  4    Period  Weeks    Status  New      SLP SHORT TERM GOAL #2   Title  Pt will complete simple money or time problems 75% accuracy with rare min A for double checking.    Time  4    Period  Weeks    Status  New      SLP SHORT TERM GOAL #3   Title  Pt will maintain selective attention for 10 minutes in min-mod noisy environment x 3 sessions.    Time  4    Period  Weeks    Status  New       SLP Long Term Goals - 11/05/19 1615     SLP LONG TERM GOAL #1   Title  Pt will use external aids to manage schedule/appointments and complete simple home tasks x4 sessions.    Time  8    Period  Weeks    Status  New      SLP LONG TERM GOAL #2   Title  Pt will use list to successfully retrieve at least 5 items from the grocery store.    Time  8    Period  Weeks    Status  New      SLP LONG TERM GOAL #3   Title  Patient will double check and ID when appropriate change not given in 90% of opportunities x 3 sessions (simulations).    Time  8    Period  Weeks    Status  New       Plan - 11/05/19 1615   Clinical Impression Statement  Mrs. Elley presents with moderate cognitive communication deficits; SLP questions contributions of depression, personality disorder, PTSD. Patient reports frustration with loss of independence, and is distressed when her husband and daughter tell her she repeats herself or forgets conversations. She reports decreased ability to  shop and pay her bills since her CVA: "I stand in the store because I don't know what I came for." Decreased attention (selective), recall, problem solving, and executive function skills highlighted in cognitive assessment today. Patient tearful when attempting simple math, telling SLP she fears she has been losing money because she can't calculate change due. I recommend skilled ST to address cognitive communication deficits to increase pt's independence and quality of life.    Speech Therapy Frequency  2x / week    Duration  --   8 weeks or 17 visits   Treatment/Interventions  Environmental controls;Cueing hierarchy;SLP instruction and feedback;Cognitive reorganization;Functional tasks;Compensatory strategies;Internal/external aids;Patient/family education    Potential to Achieve Goals  Good    Potential Considerations  Co-morbidities;Previous level of function    SLP Home Exercise Plan  memory strategies provided, see pt instructions    Consulted and Agree with Plan of Care  Patient       Patient will benefit from skilled therapeutic intervention in order to improve the following deficits and impairments:   Cognitive communication deficit  Dysarthria and anarthria    Problem List Patient Active Problem  List   Diagnosis Date Noted  . CVA (cerebral vascular accident) ([place]) 09/09/2019  . Acute ischemic stroke ([place]) 09/08/2019  . Ear pain, bilateral 03/30/2019  . Coronary artery disease 10/16/2018  . Nasal congestion 08/15/2017  . Hypersomnia with sleep apnea 04/18/2017  . Snoring 04/18/2017  . Post traumatic stress disorder (PTSD) 04/18/2017  . COPD mixed type ([place]) 04/18/2017  . Somnolence, daytime 02/26/2017  . Cervical paraspinal muscle spasm 10/26/2016  . Lower back pain 05/27/2016  . Left hip pain 05/27/2016  . Fibromyalgia 08/18/2015  . Rheumatoid arthritis ([place]) 03/09/2015  . Essential hypertension, benign 02/18/2015  . Right knee pain 02/18/2015  . Cervical disc  disorder with radiculopathy of cervical region 01/21/2015  . Bilateral arm pain 01/13/2015  . After cataract 07/22/2014  . Personal history of colonic polyp - adenoma 03/01/2014  . [place]idal intertrigo 12/29/2013  . Dyshidrotic eczema 12/29/2013  . Routine adult health maintenance 12/29/2013  . 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 Frequent falls 02/10/2013  . Generalized pain 08/30/2012  . Neck pain 02/21/2012  . STROKE 07/29/2008  . Seizures ([place]) 07/29/2008  . Migraine without aura 07/29/2007  . DEPENDENCE, BARB/SED, CONTINUOUS 06/24/2007  . HYPERLIPIDEMIA 12/19/2006  . OBESITY, NOS 12/19/2006  . Depression with anxiety 12/19/2006  . PANIC ATTACKS 12/19/2006  . 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 BORDERLINE PERSONALITY 12/19/2006  . COPD 12/19/2006  . REFLUX ESOPHAGITIS 12/19/2006   Deneise Lever, [place], [place] 11/09/2019, 8:56 AM  [facility] [address] [place], [place], [address] Phone: [phone]   Fax:  [phone]  Name: [name] MRN: [id number] Date of Birth: 01-01-1951

## 2019-11-11 ENCOUNTER — Ambulatory Visit: Payer: Medicare Other

## 2019-11-11 ENCOUNTER — Encounter: Payer: Medicare Other | Admitting: Occupational Therapy

## 2019-11-17 ENCOUNTER — Ambulatory Visit: Payer: Medicare Other | Admitting: Speech Pathology

## 2019-11-17 ENCOUNTER — Ambulatory Visit: Payer: Medicare Other | Admitting: Occupational Therapy

## 2019-11-23 ENCOUNTER — Ambulatory Visit: Payer: Medicare Other | Admitting: Physical Therapy

## 2019-11-24 ENCOUNTER — Ambulatory Visit: Payer: Medicare Other | Admitting: Occupational Therapy

## 2019-11-24 ENCOUNTER — Ambulatory Visit: Payer: Medicare Other | Admitting: Physical Therapy

## 2019-11-24 ENCOUNTER — Ambulatory Visit: Payer: Medicare Other | Admitting: Speech Pathology

## 2019-11-26 ENCOUNTER — Ambulatory Visit: Payer: Medicare Other | Admitting: Speech Pathology

## 2019-11-26 ENCOUNTER — Encounter: Payer: Medicare Other | Admitting: Speech Pathology

## 2019-11-26 ENCOUNTER — Ambulatory Visit: Payer: Medicare Other | Admitting: Occupational Therapy

## 2019-11-26 ENCOUNTER — Ambulatory Visit: Payer: Medicare Other | Attending: Internal Medicine | Admitting: Rehabilitation

## 2019-11-26 ENCOUNTER — Other Ambulatory Visit: Payer: Self-pay | Admitting: Family Medicine

## 2019-11-26 ENCOUNTER — Encounter: Payer: Medicare Other | Admitting: Occupational Therapy

## 2019-11-26 ENCOUNTER — Ambulatory Visit: Payer: Medicare Other | Admitting: Physical Therapy

## 2019-11-26 DIAGNOSIS — F32A Depression, unspecified: Secondary | ICD-10-CM

## 2019-11-26 DIAGNOSIS — F329 Major depressive disorder, single episode, unspecified: Secondary | ICD-10-CM

## 2019-11-27 ENCOUNTER — Telehealth: Payer: Self-pay | Admitting: Speech Pathology

## 2019-11-27 NOTE — Telephone Encounter (Signed)
SLP contacted patient by phone; she has not returned for therapy session since evaluation on 11/05/19. Patient reports she cancelled today's appointments due to not feeling well; no call is documented in our system. SLP informed today of no-show policy; if patient no-shows for 2 additional appointments, remainder of sessions will be cancelled and new referral will be needed to return. [facility] [facility] office informed and will mail printed schedule to patient with upcoming appointments.  Deneise Lever, MS, Actor

## 2019-12-01 ENCOUNTER — Ambulatory Visit: Payer: Medicare Other | Admitting: Physical Therapy

## 2019-12-01 ENCOUNTER — Ambulatory Visit: Payer: Medicare Other | Admitting: Occupational Therapy

## 2019-12-01 ENCOUNTER — Ambulatory Visit: Payer: Medicare Other | Admitting: Speech Pathology

## 2019-12-01 ENCOUNTER — Encounter: Payer: Medicare Other | Admitting: Occupational Therapy

## 2019-12-03 ENCOUNTER — Ambulatory Visit: Payer: Medicare Other | Admitting: Physical Therapy

## 2019-12-03 ENCOUNTER — Ambulatory Visit: Payer: Medicare Other | Admitting: Speech Pathology

## 2019-12-03 ENCOUNTER — Ambulatory Visit: Payer: Medicare Other | Admitting: Occupational Therapy

## 2019-12-04 ENCOUNTER — Ambulatory Visit: Payer: Medicare Other | Admitting: Physical Therapy

## 2019-12-04 ENCOUNTER — Encounter: Payer: Medicare Other | Admitting: Occupational Therapy

## 2019-12-08 ENCOUNTER — Ambulatory Visit: Payer: Medicare Other | Admitting: Physical Therapy

## 2019-12-08 ENCOUNTER — Ambulatory Visit: Payer: Medicare Other | Admitting: Speech Pathology

## 2019-12-08 ENCOUNTER — Encounter: Payer: Medicare Other | Admitting: Occupational Therapy

## 2019-12-08 ENCOUNTER — Ambulatory Visit: Payer: Medicare Other | Admitting: Occupational Therapy

## 2019-12-08 ENCOUNTER — Encounter: Payer: Medicare Other | Admitting: Speech Pathology

## 2019-12-10 ENCOUNTER — Ambulatory Visit: Payer: Medicare Other | Admitting: Occupational Therapy

## 2019-12-10 ENCOUNTER — Ambulatory Visit: Payer: Medicare Other | Admitting: Speech Pathology

## 2019-12-10 ENCOUNTER — Ambulatory Visit: Payer: Medicare Other | Admitting: Physical Therapy

## 2019-12-11 ENCOUNTER — Encounter: Payer: Medicare Other | Admitting: Occupational Therapy

## 2019-12-11 ENCOUNTER — Ambulatory Visit: Payer: Medicare Other | Admitting: Physical Therapy

## 2019-12-15 ENCOUNTER — Ambulatory Visit: Payer: Medicare Other | Admitting: Speech Pathology

## 2019-12-15 ENCOUNTER — Ambulatory Visit: Payer: Medicare Other | Admitting: Physical Therapy

## 2019-12-15 ENCOUNTER — Encounter: Payer: Medicare Other | Admitting: Occupational Therapy

## 2019-12-15 ENCOUNTER — Ambulatory Visit: Payer: Medicare Other | Admitting: Occupational Therapy

## 2019-12-15 ENCOUNTER — Encounter: Payer: Medicare Other | Admitting: Speech Pathology

## 2019-12-17 ENCOUNTER — Ambulatory Visit: Payer: Medicare Other | Admitting: Speech Pathology

## 2019-12-17 ENCOUNTER — Ambulatory Visit: Payer: Medicare Other | Admitting: Physical Therapy

## 2019-12-17 ENCOUNTER — Ambulatory Visit: Payer: Medicare Other | Admitting: Occupational Therapy

## 2019-12-17 ENCOUNTER — Encounter: Payer: Self-pay | Admitting: Speech Pathology

## 2019-12-17 NOTE — Therapy (Signed)
[facility] [address] [place], [place], [address] Phone: [phone]   Fax:  [phone]  Patient Details  Name: [name] MRN: [id number] Date of Birth: Oct 27, 1950 Referring Provider:  No ref. provider found  Encounter Date: 12/17/2019  Pt has not returned for O.T. since initial evaluation on 11/05/19, therefore will d/c episode of care at this time.   Carey Bullocks, OTR/L 12/17/2019, 5:14 PM  [facility] [address] [place], [place], [address] Phone: [phone]   Fax:  [phone]

## 2019-12-17 NOTE — Therapy (Signed)
SPEECH THERAPY DISCHARGE SUMMARY  Visits from Start of Care: 1  Current functional level related to goals / functional outcomes: Patient did not return for therapy after evaluation on 11/09/19. She cancelled and then no-showed for several appointments and is discharged today per clinic no-show policy.    Remaining deficits: Please see ST evaluation 11/09/19 for details re: current functional level.    Education / Equipment: Therapist reached patient by phone and informed her of cancellation of remaining appointments per no-show policy, and that pt will need MD order if she wishes to return for therapy should there be a more convenient time for her to do so in the future.  Plan: Patient agrees to discharge.  Patient goals were not met. Patient is being discharged due to not returning since the last visit.  ?????          SLP Short Term Goals - 12/17/19 1637      SLP SHORT TERM GOAL #1   Title  Patient will use 2 memory strategies in or between 2 speech therapy sessions.    Time  4    Period  Weeks    Status  Not Met      SLP SHORT TERM GOAL #2   Title  Pt will complete simple money or time problems 75% accuracy with rare min A for double checking.    Time  4    Period  Weeks    Status  Not Met      SLP SHORT TERM GOAL #3   Title  Pt will maintain selective attention for 10 minutes in min-mod noisy environment x 3 sessions.    Time  4    Period  Weeks    Status  Not Met      SLP Long Term Goals - 12/17/19 1637      SLP LONG TERM GOAL #1   Title  Pt will use external aids to manage schedule/appointments and complete simple home tasks x4 sessions.    Time  8    Period  Weeks    Status  Not Met      SLP LONG TERM GOAL #2   Title  Pt will use list to successfully retrieve at least 5 items from the grocery store.    Time  8    Period  Weeks    Status  Not Met      SLP LONG TERM GOAL #3   Title  Patient will double check and ID when appropriate change not given in 90%  of opportunities x 3 sessions (simulations).    Time  8    Period  Weeks    Status  Not Met      Paula Lever, [name], [name] [address] [place], [place], [address] Phone: [phone]   Fax:  [phone]  Patient Details  Name: [name] MRN: [id number] Date of Birth: October 01, 1951 Referring Provider:  No ref. provider found  Encounter Date: 12/17/2019   Aliene Altes 12/17/2019, 4:37 PM  [facility] [address] [place], [place], [address] Phone: [phone]   Fax:  [phone]

## 2019-12-18 ENCOUNTER — Encounter: Payer: Medicare Other | Admitting: Occupational Therapy

## 2019-12-18 ENCOUNTER — Ambulatory Visit: Payer: Medicare Other | Admitting: Rehabilitation

## 2019-12-22 ENCOUNTER — Ambulatory Visit: Payer: Medicare Other | Admitting: Physical Therapy

## 2019-12-22 ENCOUNTER — Encounter: Payer: Medicare Other | Admitting: Occupational Therapy

## 2019-12-22 ENCOUNTER — Encounter: Payer: Medicare Other | Admitting: Speech Pathology

## 2019-12-22 ENCOUNTER — Ambulatory Visit: Payer: Medicare Other | Admitting: Speech Pathology

## 2019-12-22 ENCOUNTER — Ambulatory Visit: Payer: Medicare Other | Admitting: Occupational Therapy

## 2019-12-24 ENCOUNTER — Ambulatory Visit: Payer: Medicare Other | Admitting: Rehabilitation

## 2019-12-24 ENCOUNTER — Encounter: Payer: Medicare Other | Admitting: Occupational Therapy

## 2019-12-24 ENCOUNTER — Encounter: Payer: Medicare Other | Admitting: Speech Pathology

## 2019-12-25 ENCOUNTER — Encounter: Payer: Medicare Other | Admitting: Occupational Therapy

## 2019-12-25 ENCOUNTER — Ambulatory Visit: Payer: Medicare Other | Admitting: Physical Therapy

## 2019-12-29 ENCOUNTER — Encounter: Payer: Medicare Other | Admitting: Occupational Therapy

## 2019-12-29 ENCOUNTER — Encounter: Payer: Medicare Other | Admitting: Speech Pathology

## 2019-12-29 ENCOUNTER — Ambulatory Visit: Payer: Medicare Other | Admitting: Physical Therapy

## 2019-12-31 ENCOUNTER — Encounter: Payer: Medicare Other | Admitting: Speech Pathology

## 2019-12-31 ENCOUNTER — Ambulatory Visit: Payer: Medicare Other | Admitting: Physical Therapy

## 2019-12-31 ENCOUNTER — Encounter: Payer: Medicare Other | Admitting: Occupational Therapy

## 2020-01-01 ENCOUNTER — Encounter: Payer: Medicare Other | Admitting: Occupational Therapy

## 2020-01-01 ENCOUNTER — Ambulatory Visit: Payer: Medicare Other | Admitting: Physical Therapy

## 2020-01-05 ENCOUNTER — Encounter: Payer: Medicare Other | Admitting: Occupational Therapy

## 2020-01-05 ENCOUNTER — Ambulatory Visit: Payer: Medicare Other | Admitting: Physical Therapy

## 2020-01-05 ENCOUNTER — Encounter: Payer: Medicare Other | Admitting: Speech Pathology

## 2020-01-07 ENCOUNTER — Encounter: Payer: Medicare Other | Admitting: Speech Pathology

## 2020-01-07 ENCOUNTER — Ambulatory Visit: Payer: Medicare Other | Admitting: Rehabilitation

## 2020-01-07 ENCOUNTER — Encounter: Payer: Medicare Other | Admitting: Occupational Therapy

## 2020-01-08 ENCOUNTER — Encounter: Payer: Medicare Other | Admitting: Occupational Therapy

## 2020-01-08 ENCOUNTER — Ambulatory Visit: Payer: Medicare Other | Admitting: Rehabilitation

## 2020-01-12 ENCOUNTER — Ambulatory Visit: Payer: Medicare Other | Admitting: Physical Therapy

## 2020-01-12 ENCOUNTER — Encounter: Payer: Medicare Other | Admitting: Speech Pathology

## 2020-01-12 ENCOUNTER — Encounter: Payer: Medicare Other | Admitting: Occupational Therapy

## 2020-01-14 ENCOUNTER — Encounter: Payer: Medicare Other | Admitting: Speech Pathology

## 2020-01-14 ENCOUNTER — Encounter: Payer: Medicare Other | Admitting: Occupational Therapy

## 2020-01-14 ENCOUNTER — Ambulatory Visit: Payer: Medicare Other | Admitting: Rehabilitation

## 2020-01-15 ENCOUNTER — Ambulatory Visit: Payer: Medicare Other | Admitting: Rehabilitation

## 2020-01-15 ENCOUNTER — Encounter: Payer: Medicare Other | Admitting: Occupational Therapy

## 2020-01-20 ENCOUNTER — Other Ambulatory Visit: Payer: Self-pay | Admitting: Family Medicine

## 2020-01-20 DIAGNOSIS — G8929 Other chronic pain: Secondary | ICD-10-CM

## 2020-02-17 ENCOUNTER — Ambulatory Visit: Payer: Medicare Other | Admitting: Neurology

## 2020-03-25 ENCOUNTER — Other Ambulatory Visit: Payer: Self-pay | Admitting: Family Medicine

## 2020-03-25 ENCOUNTER — Other Ambulatory Visit: Payer: Self-pay | Admitting: Adult Health

## 2020-03-25 DIAGNOSIS — F32A Depression, unspecified: Secondary | ICD-10-CM

## 2020-03-25 NOTE — Telephone Encounter (Signed)
Needs appointment

## 2020-04-19 ENCOUNTER — Other Ambulatory Visit: Payer: Self-pay | Admitting: Family Medicine

## 2020-04-25 ENCOUNTER — Other Ambulatory Visit: Payer: Self-pay | Admitting: Family Medicine

## 2020-04-25 ENCOUNTER — Other Ambulatory Visit: Payer: Self-pay | Admitting: Neurology

## 2020-04-25 DIAGNOSIS — F32A Depression, unspecified: Secondary | ICD-10-CM

## 2020-05-02 ENCOUNTER — Telehealth: Payer: Self-pay

## 2020-05-02 MED ORDER — PHENOBARBITAL 100 MG PO TABS: 100.0000 mg | ORAL_TABLET | Freq: Every day | ORAL | 1 refills | Status: DC

## 2020-05-02 NOTE — Telephone Encounter (Signed)
Phenobarbital filled until next appointment.

## 2020-05-02 NOTE — Addendum Note (Signed)
Addended by: Brandon Melnick on: 05/02/2020 03:26 PM   Modules accepted: Orders

## 2020-05-02 NOTE — Telephone Encounter (Signed)
-  Spoke to pt and made appt - at , she has to have transportation, this will give her enough time to get.  Will refill phenobarbital until then.

## 2020-05-02 NOTE — Telephone Encounter (Signed)
Pt left a VM asking for a call back. No other details were given.

## 2020-05-02 NOTE — Addendum Note (Signed)
Addended by: Suzzanne Cloud on: 05/02/2020 04:24 PM   Modules accepted: Orders

## 2020-06-28 ENCOUNTER — Other Ambulatory Visit: Payer: Self-pay | Admitting: Neurology

## 2020-06-29 ENCOUNTER — Other Ambulatory Visit: Payer: Self-pay | Admitting: Neurology

## 2020-06-29 ENCOUNTER — Other Ambulatory Visit: Payer: Self-pay | Admitting: Family Medicine

## 2020-06-29 DIAGNOSIS — I10 Essential (primary) hypertension: Secondary | ICD-10-CM

## 2020-07-18 ENCOUNTER — Other Ambulatory Visit: Payer: Self-pay | Admitting: Family Medicine

## 2020-07-18 DIAGNOSIS — G8929 Other chronic pain: Secondary | ICD-10-CM

## 2020-07-28 ENCOUNTER — Telehealth: Payer: Self-pay | Admitting: Neurology

## 2020-07-28 ENCOUNTER — Ambulatory Visit (INDEPENDENT_AMBULATORY_CARE_PROVIDER_SITE_OTHER): Payer: Medicare Other | Admitting: Neurology

## 2020-07-28 ENCOUNTER — Other Ambulatory Visit: Payer: Self-pay | Admitting: Neurology

## 2020-07-28 ENCOUNTER — Encounter: Payer: Self-pay | Admitting: Neurology

## 2020-07-28 VITALS — BP 126/82 | HR 85 | Ht 65.0 in | Wt 212.2 lb

## 2020-07-28 DIAGNOSIS — R569 Unspecified convulsions: Secondary | ICD-10-CM

## 2020-07-28 DIAGNOSIS — G40309 Generalized idiopathic epilepsy and epileptic syndromes, not intractable, without status epilepticus: Secondary | ICD-10-CM

## 2020-07-28 DIAGNOSIS — G43009 Migraine without aura, not intractable, without status migrainosus: Secondary | ICD-10-CM | POA: Diagnosis not present

## 2020-07-28 DIAGNOSIS — I639 Cerebral infarction, unspecified: Secondary | ICD-10-CM | POA: Diagnosis not present

## 2020-07-28 MED ORDER — TOPIRAMATE 50 MG PO TABS: ORAL_TABLET | ORAL | 5 refills | Status: DC

## 2020-07-28 NOTE — Telephone Encounter (Signed)
Medicare/medicaid order sent to [facility]. No auth they will reach out to the patient to schedule.  °

## 2020-07-28 NOTE — Telephone Encounter (Signed)
I spoke with Chris @ [facility]. He stated the pt picked up a 3 month supply today. The request is to place additional refills on file.

## 2020-07-28 NOTE — Telephone Encounter (Signed)
Can you reach out to her PCP and ask them to call her and schedule an appointment, she has not been seen since her stroke last year, she apparently hasn't thought it reach out, but she needs to be seen.

## 2020-07-28 NOTE — Patient Instructions (Signed)
Order MRI of the brain  Check blood work today Increase Topamax 100 mg twice daily Continue Phenobarbital  Need to see you primary doctor  Remain on aspirin  See you back in 4 months

## 2020-07-28 NOTE — Progress Notes (Signed)
PATIENT: [name] DOB: April 26, 1951  REASON FOR VISIT: follow up HISTORY FROM: patient  HISTORY OF PRESENT ILLNESS: Today 07/28/20 [name] is a 69 year old female with history of seizures and headaches.  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  She had left-sided numbness, speech difficulty, blurred vision. She remains on aspirin.   Today, reports 1 month of dizziness, feels like the room is spinning, when she stands, she will lose her balance, she has fallen four times. She may lean into the wall. Her left side, has minimal weakness to the left hand post stroke. She gets short of breath at times. Around the time of dizziness, her headaches return, is daily, feels like a hammer, remains on Topamax, taking BC powders. On phenobarbital, no recurrent seizure. Never followed up with PCP, her vision isn't great, before the stroke, last time referred to ophthalmology, she did not go. Feels something is hanging in her visual field. Seeing pain management, will have injections for the neck and low back. Her family members have been doing PT for her, she is on a walker. From stroke, has mild weakness to left hand, gait instability, very mild slurred speech. Here today for follow-up unaccompanied.  HISTORY 10/20/2019 SS: [name] is a 69 year old female with history of seizures and headaches.  She was recently hospitalized and found to have acute infarction involving the right corona radiata by MRI on 09/08/2019.  Apparently she developed focal numbness, speech difficulty, and blurred vision.  She had left-sided numbness and weakness that progressed over 3-7 days starting in her face, resulting in a fall.  She also had slurred speech.  She was started on aspirin daily (was not on prior), and Plavix combination for 3 weeks, then aspirin alone.  She refused SNF.  CTA head and neck, showed no large vessel occlusion.  LDL was 139, A1c 5.7.  She was started on Lipitor, LDL  was 139, goal less than 70.  Since the stroke, reports blurry vision to her left eye, slurred speech, left-sided weakness in her arm and leg.  She has not had any falls, but is being very careful.  She uses a walker for ambulation.  She has not had recurrent seizure. She has chronic right knee pain. She has yet to follow-up with her primary doctor. She does manage her medications, reports she stopped atorvastatin and Plavix (but completed the 3 weeks of dual therapy). She will be starting neuro rehab, speech, physical, and occupational therapy 11/04/2018.  It was recommended she go to a SNF, but she refused.  She presents today for evaluation unaccompanied   REVIEW OF SYSTEMS: Out of a complete 14 system review of symptoms, the patient complains only of the following symptoms, and all other reviewed systems are negative.  Headache, dizziness, seizures  ALLERGIES: Allergies  Allergen Reactions  . Amoxicillin     REACTION: hives  . Aspirin     REACTION: rash  . Codeine Phosphate     REACTION: N/V  . Flexeril [Cyclobenzaprine] Nausea And Vomiting    Dizziness   . Ibuprofen Hives  . Penicillins     REACTION: hives  . Tetanus Toxoids Swelling    HOME MEDICATIONS: Outpatient Medications Prior to Visit  Medication Sig Dispense Refill  . albuterol (PROVENTIL HFA;VENTOLIN HFA) 108 (90 Base) MCG/ACT inhaler INHALE 2 PUFFS INTO THE LUNGS EVERY 6 HOURS AS NEEDED FOR WHEEZING ORSHORTNESS OF BREATH. (Patient taking differently: Inhale 2 puffs into the lungs every 6 (six)  hours as needed for wheezing or shortness of breath. ) 18 g 3  . alendronate (FOSAMAX) 70 MG tablet TAKE 1 TABLET BY MOUTH ONCE WEEKLY 12 tablet 1  . aspirin EC 81 MG EC tablet Take 1 tablet (81 mg total) by mouth daily. 30 tablet 0  . atorvastatin (LIPITOR) 80 MG tablet TAKE 1 TABLET BY MOUTH ONCE DAILY AT 6PM 30 tablet 0  . carvedilol (COREG) 3.125 MG tablet Take 1 tablet (3.125 mg total) by mouth 2 (two) times daily with a meal.  180 tablet 1  . citalopram (CELEXA) 20 MG tablet TAKE 1 TABLET BY MOUTH ONCE DAILY 30 tablet 2  . clotrimazole (LOTRIMIN) 1 % cream Apply 1 application topically 2 (two) times daily. Under breasts 30 g 1  . folic acid (FOLVITE) 1 MG tablet Take 1 mg by mouth.    . gabapentin (NEURONTIN) 300 MG capsule TAKE 3 CAPSULES BY MOUTH TWICE DAILY 180 capsule 2  . HYDROcodone-acetaminophen (NORCO) 10-325 MG tablet Take 1-2 tablets by mouth See admin instructions. 1 tablet in the morning and 2 tablets at night    . omeprazole (PRILOSEC) 20 MG capsule TAKE 1 CAPSULE BY MOUTH ONCE DAILY 30 capsule 3  . PHENObarbital (LUMINAL) 100 MG tablet Take 1 tablet (100 mg total) by mouth daily. 90 tablet 1  . tiotropium (SPIRIVA HANDIHALER) 18 MCG inhalation capsule INHALE THE CONTENTS OF 1 CAPSULE VIA HANDIHALER BY MOUTH ONCE DAILY (Patient taking differently: Place 18 mcg into inhaler and inhale daily. ) 30 capsule 3  . topiramate (TOPAMAX) 50 MG tablet TAKE 1 TABLET BY MOUTH EVERY MORNING AND 2 TABLETS BY MOUTH AT BEDTIME. Must be seen for further refills. Call [phone] to schedule. 90 tablet 0  . Adalimumab 40 MG/0.4ML PNKT Inject 1 pen into the skin every 7 (seven) days.      No facility-administered medications prior to visit.    PAST MEDICAL HISTORY: Past Medical History:  Diagnosis Date  . Asthma   . Chronic low back pain   . Collagen vascular disease ([place])   . 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  . COPD (chronic obstructive pulmonary disease) ([place])   . Depression   . Fibromyalgia    08/18/15  [facility]  . GERD (gastroesophageal reflux disease)   . History of CVA (cerebrovascular accident)    "68 minni srokes"  . Hyperlipidemia   . Hypertension   . Migraine   . Personal history of colonic polyp - adenoma 03/01/2014  . Personality disorder ([place])   . Rheumatoid arthritis ([place])    08/18/15  [facility]  . Seizures ([place])    last seizure 4 years ago  . Sleep apnea    Mild.  No need for CPAP.  Marland Kitchen Stroke [facility])     PAST SURGICAL HISTORY: Past Surgical History:  Procedure Laterality Date  . ANKLE SURGERY Left   . APPENDECTOMY    . BACK SURGERY    . BUNIONECTOMY     2 toes  . CARDIAC CATHETERIZATION  2008   Normal. Dr Acie Fredrickson.  . CHOLECYSTECTOMY    . CYST EXCISION     leg  . ESOPHAGOGASTRODUODENOSCOPY (EGD) WITH PROPOFOL N/A 06/27/2018   Procedure: ESOPHAGOGASTRODUODENOSCOPY (EGD) WITH PROPOFOL;  Surgeon: Jonathon Bellows, MD;  Location[facility] ENDOSCOPY;  Service: Gastroenterology;  Laterality: N/A;  . FLANK MASS EXCISION    . FOOT TENDON SURGERY  june 2014   both feet  . KNEE SURGERY     Bilateral.  .  NOSE SURGERY    . SHOULDER SURGERY     bil.  Marland Kitchen TOTAL ABDOMINAL HYSTERECTOMY      FAMILY HISTORY: Family History  Problem Relation Age of Onset  . Cirrhosis Mother   . Diabetes Mother   . Cirrhosis Father   . Breast cancer Sister 67  . Breast cancer Sister 29  . Colon cancer Neg Hx     SOCIAL HISTORY: Social History   Socioeconomic History  . Marital status: Married    Spouse name: Clearence   . Number of children: 1  . Years of education: 74  . Highest education level: Not on file  Occupational History  . Occupation: Unemployed   Tobacco Use  . Smoking status: Former Research scientist (life sciences)  . Smokeless tobacco: Never Used  Vaping Use  . Vaping Use: Never used  Substance and Sexual Activity  . Alcohol use: No  . Drug use: No  . Sexual activity: Not on file  Other Topics Concern  . Not on file  Social History Narrative   Patient lives at home with her husband [name].    Patient is currently not working.    Patient has 1 child.    Patient has a 11th grade education.    Social Determinants of Health   Financial Resource Strain:   . Difficulty of Paying Living Expenses: Not on file  Food Insecurity:   . Worried About Charity fundraiser in the Last Year: Not on file  . Ran Out of Food in the Last Year: Not on file  Transportation Needs:   . Lack  of Transportation (Medical): Not on file  . Lack of Transportation (Non-Medical): Not on file  Physical Activity:   . Days of Exercise per Week: Not on file  . Minutes of Exercise per Session: Not on file  Stress:   . Feeling of Stress : Not on file  Social Connections:   . Frequency of Communication with Friends and Family: Not on file  . Frequency of Social Gatherings with Friends and Family: Not on file  . Attends Religious Services: Not on file  . Active Member of Clubs or Organizations: Not on file  . Attends Archivist Meetings: Not on file  . Marital Status: Not on file  Intimate Partner Violence:   . Fear of Current or Ex-Partner: Not on file  . Emotionally Abused: Not on file  . Physically Abused: Not on file  . Sexually Abused: Not on file   PHYSICAL EXAM  Vitals:   07/28/20 1501  BP: 126/82  Pulse: 85  Weight: 212 lb 3.2 oz (96.3 kg)  Height: 5\' 5"  (1.651 m)   Body mass index is 35.31 kg/m. No data found.  Generalized: Well developed, in no acute distress   Neurological examination  Mentation: Alert oriented to time, place, limited historian. Follows all commands speech and language fluent Cranial nerve II-XII: Pupils were equal round reactive to light. Extraocular movements were full, visual field were full on confrontational test. Facial sensation and strength were normal. Head turning and shoulder shrug were normal and symmetric. Motor: Good strength all extremities, slight weakness 4/5 left upper extremity, left grip mildly weaker than right Sensory: Sensory testing is intact to soft touch on all 4 extremities. No evidence of extinction is noted.  Coordination: Mild difficulty with finger-nose-finger with the left Gait and station: Wide-based, is unsteady, does fairly well with walker but is cautious Reflexes: Deep tendon reflexes are symmetric but are decreased throughout  DIAGNOSTIC DATA (LABS, IMAGING, TESTING) - I reviewed patient records,  labs, notes, testing and imaging myself where available.  Lab Results  Component Value Date   WBC 7.9 09/08/2019   HGB 11.6 (L) 09/08/2019   HCT 34.0 (L) 09/08/2019   MCV 83.4 09/08/2019   PLT 212 09/08/2019      Component Value Date/Time   NA 138 09/08/2019 1449   NA 138 05/05/2019 1142   NA 140 09/01/2014 1600   K 4.0 09/08/2019 1449   K 4.3 09/01/2014 1600   CL 108 09/08/2019 1449   CL 111 (H) 09/01/2014 1600   CO2 17 (L) 09/08/2019 1440   CO2 18 (L) 09/01/2014 1600   GLUCOSE 99 09/08/2019 1449   GLUCOSE 78 09/01/2014 1600   BUN 6 (L) 09/08/2019 1449   BUN 10 05/05/2019 1142   BUN 10 09/01/2014 1600   CREATININE 0.50 09/08/2019 1449   CREATININE 0.63 01/24/2015 1449   CALCIUM 8.4 (L) 09/08/2019 1440   CALCIUM 8.3 (L) 09/01/2014 1600   PROT 7.6 09/08/2019 1440   PROT 7.4 05/05/2019 1142   PROT 6.8 08/16/2014 0431   ALBUMIN 3.4 (L) 09/08/2019 1440   ALBUMIN 3.7 (L) 05/05/2019 1142   ALBUMIN 2.7 (L) 08/16/2014 0431   AST 19 09/08/2019 1440   AST 16 08/16/2014 0431   ALT QUANTITY NOT SUFFICIENT, UNABLE TO PERFORM TEST 09/08/2019 1440   ALT 17 08/16/2014 0431   ALKPHOS 97 09/08/2019 1440   ALKPHOS 86 08/16/2014 0431   BILITOT QUANTITY NOT SUFFICIENT, UNABLE TO PERFORM TEST 09/08/2019 1440   BILITOT 0.2 05/05/2019 1142   BILITOT 0.3 08/16/2014 0431   GFRNONAA >60 09/08/2019 1440   GFRNONAA >60 09/01/2014 1600   GFRNONAA >60 07/13/2013 1013   GFRNONAA 84 08/26/2012 1520   GFRAA >60 09/08/2019 1440   GFRAA >60 09/01/2014 1600   GFRAA >60 07/13/2013 1013   GFRAA >89 08/26/2012 1520   Lab Results  Component Value Date   CHOL 206 (H) 09/09/2019   HDL 49 09/09/2019   LDLCALC 139 (H) 09/09/2019   TRIG 89 09/09/2019   CHOLHDL 4.2 09/09/2019   Lab Results  Component Value Date   HGBA1C 5.7 (H) 09/09/2019   Lab Results  Component Value Date   VITAMINB12 483 09/05/2017   Lab Results  Component Value Date   TSH 2.520 02/11/2014   ASSESSMENT AND PLAN 69 y.o.  year old female  has a past medical history of Asthma, Chronic low back pain, Collagen vascular disease ([place]), Conversion disorder with seizures or convulsions, COPD (chronic obstructive pulmonary disease) ([place]), Depression, Fibromyalgia, GERD (gastroesophageal reflux disease), History of CVA (cerebrovascular accident), Hyperlipidemia, Hypertension, Migraine, Personal history of colonic polyp - adenoma (03/01/2014), Personality disorder ([place]), Rheumatoid arthritis ([place]), Seizures ([place]), Sleep apnea, and Stroke ([place]). here with:  1. History of stroke November 2020, right corona radiata infarct into internal capsule/basal ganglia 2. Onset dizziness, headache 1 month ago 3. History of migraine headache -Check MRI of the brain to rule out new stroke -Could be migraine related, symptoms occurred around the same time -Increase Topamax 100 mg twice a day -Will refer to ophthalmology, visual complaints -Will send message to PCP to please call and schedule patient for follow-up -Orthostatics were negative -Continue aspirin 81 mg daily -Signed handicap sticker  4. History of seizures -Continue phenobarbital, has one refill on file -Check routine blood work, make sure phenobarbital level is okay -Follow-up in 4 months or sooner if needed with Dr. Jannifer Franklin  If MRI of  the brain is unremarkable, may consider further management of potential migraine headache as etiology of symptoms  Orders Placed This Encounter  Procedures  . MR BRAIN WO CONTRAST  . CBC with Differential/Platelet  . CMP  . Phenobarbital level  . Ambulatory referral to Ophthalmology     I spent 30 minutes of face-to-face and non-face-to-face time with patient.  This included previsit chart review, lab review, study review, order entry, electronic health record documentation, patient education.  Butler Denmark, AGNP-C, DNP 07/28/2020, 3:44 PM [facility] [address] [place], [place] [address] [phone]

## 2020-07-29 ENCOUNTER — Telehealth: Payer: Self-pay | Admitting: Family Medicine

## 2020-07-29 LAB — COMPREHENSIVE METABOLIC PANEL
ALT: 15 IU/L (ref 0–32)
AST: 22 IU/L (ref 0–40)
Albumin/Globulin Ratio: 1.2 (ref 1.2–2.2)
Albumin: 3.8 g/dL (ref 3.8–4.8)
Alkaline Phosphatase: 120 IU/L (ref 44–121)
BUN/Creatinine Ratio: 19 (ref 12–28)
BUN: 14 mg/dL (ref 8–27)
Bilirubin Total: <0.2 mg/dL (ref 0.0–1.2)
CO2: 24 mmol/L (ref 20–29)
Calcium: 8.6 mg/dL — ABNORMAL LOW (ref 8.7–10.3)
Chloride: 106 mmol/L (ref 96–106)
Creatinine, Ser: 0.75 mg/dL (ref 0.57–1.00)
GFR calc Af Amer: 93 mL/min/{1.73_m2} (ref >59)
GFR calc non Af Amer: 81 mL/min/{1.73_m2} (ref >59)
Globulin, Total: 3.1 g/dL (ref 1.5–4.5)
Glucose: 79 mg/dL (ref 65–99)
Potassium: 4.6 mmol/L (ref 3.5–5.2)
Sodium: 141 mmol/L (ref 134–144)
Total Protein: 6.9 g/dL (ref 6.0–8.5)

## 2020-07-29 LAB — CBC WITH DIFFERENTIAL/PLATELET
Basophils Absolute: 0.1 10*3/uL (ref 0.0–0.2)
Basos: 1 %
EOS (ABSOLUTE): 0.4 10*3/uL (ref 0.0–0.4)
Eos: 7 %
Hematocrit: 34.9 % (ref 34.0–46.6)
Hemoglobin: 10.7 g/dL — ABNORMAL LOW (ref 11.1–15.9)
Immature Grans (Abs): 0 10*3/uL (ref 0.0–0.1)
Immature Granulocytes: 0 %
Lymphocytes Absolute: 2.7 10*3/uL (ref 0.7–3.1)
Lymphs: 43 %
MCH: 25.5 pg — ABNORMAL LOW (ref 26.6–33.0)
MCHC: 30.7 g/dL — ABNORMAL LOW (ref 31.5–35.7)
MCV: 83 fL (ref 79–97)
Monocytes Absolute: 0.4 10*3/uL (ref 0.1–0.9)
Monocytes: 7 %
Neutrophils Absolute: 2.7 10*3/uL (ref 1.4–7.0)
Neutrophils: 42 %
Platelets: 200 10*3/uL (ref 150–450)
RBC: 4.19 x10E6/uL (ref 3.77–5.28)
RDW: 13.7 % (ref 11.7–15.4)
WBC: 6.3 10*3/uL (ref 3.4–10.8)

## 2020-07-29 LAB — PHENOBARBITAL LEVEL: Phenobarbital, Serum: 23 ug/mL (ref 15–40)

## 2020-07-29 NOTE — Telephone Encounter (Signed)
Can we please call [name] to get her scheduled for follow-up visit within our clinic?  She was recently seen by her neurologist yesterday with new complaints and would benefit from following up in our clinic as it is been quite some time.  Thank you,  Patriciaann Clan, DO

## 2020-07-29 NOTE — Progress Notes (Signed)
I have read the note, and I agree with the clinical assessment and plan.  Jeweldean Drohan K Jarmarcus Wambold   

## 2020-08-01 ENCOUNTER — Other Ambulatory Visit: Payer: Self-pay | Admitting: Neurology

## 2020-08-01 NOTE — Telephone Encounter (Signed)
Patient is already scheduled with her PCP on 08/03/2020 .

## 2020-08-03 ENCOUNTER — Other Ambulatory Visit: Payer: Self-pay

## 2020-08-03 ENCOUNTER — Encounter: Payer: Self-pay | Admitting: Family Medicine

## 2020-08-03 ENCOUNTER — Ambulatory Visit (INDEPENDENT_AMBULATORY_CARE_PROVIDER_SITE_OTHER): Payer: Medicare Other | Admitting: Family Medicine

## 2020-08-03 VITALS — BP 128/84 | HR 70 | Ht 65.0 in | Wt 211.2 lb

## 2020-08-03 DIAGNOSIS — J449 Chronic obstructive pulmonary disease, unspecified: Secondary | ICD-10-CM | POA: Diagnosis not present

## 2020-08-03 DIAGNOSIS — R0600 Dyspnea, unspecified: Secondary | ICD-10-CM

## 2020-08-03 DIAGNOSIS — R0609 Other forms of dyspnea: Secondary | ICD-10-CM

## 2020-08-03 DIAGNOSIS — I63311 Cerebral infarction due to thrombosis of right middle cerebral artery: Secondary | ICD-10-CM | POA: Diagnosis not present

## 2020-08-03 DIAGNOSIS — B372 Candidiasis of skin and nail: Secondary | ICD-10-CM | POA: Diagnosis not present

## 2020-08-03 DIAGNOSIS — R21 Rash and other nonspecific skin eruption: Secondary | ICD-10-CM | POA: Diagnosis not present

## 2020-08-03 DIAGNOSIS — Z23 Encounter for immunization: Secondary | ICD-10-CM

## 2020-08-03 DIAGNOSIS — I10 Essential (primary) hypertension: Secondary | ICD-10-CM | POA: Diagnosis not present

## 2020-08-03 MED ORDER — CLOTRIMAZOLE 1 % EX CREA: 1.0000 "application " | TOPICAL_CREAM | Freq: Two times a day (BID) | CUTANEOUS | 1 refills | Status: DC

## 2020-08-03 MED ORDER — SPIRIVA HANDIHALER 18 MCG IN CAPS: ORAL_CAPSULE | RESPIRATORY_TRACT | 3 refills | Status: DC

## 2020-08-03 MED ORDER — TRIAMCINOLONE ACETONIDE 0.1 % EX OINT: 1.0000 "application " | TOPICAL_OINTMENT | Freq: Two times a day (BID) | CUTANEOUS | 1 refills | Status: DC

## 2020-08-03 NOTE — Progress Notes (Signed)
SUBJECTIVE:   CHIEF COMPLAINT / HPI: Follow-up  Ms. Giarratano is a 69 year old female presenting to discuss the following:  She has not follow-up in our clinic for her chronic conditions since 09/2018.  Rashes: Needs a refill of cream for breasts. Previously worked well but rash is now back underneath her breasts for the past few weeks. Lotrimin previously resolved this. Does not wear a bra. Also has similar rash on her feet, both feet. Also has a rash on her anterior lower leg on the left present for the last week. Non-painful or itchy. No new lotions, creams, detergents.  Denies exposure to poison ivy/oak.  No new medications.  Denies any associated fever, change in bowel movements, or rashes similar to this elsewhere.  Breathing: Getting worse. Getting out of breath quicker. Comfortable at rest, but quickly once starting to walk feeling short of breath. Happening for the last month or two. Prior to this, could walk 50 ft prior to getting winded. Denies any chest pain with this. Hearing wheezing. Albuterol doesn't seem to help much. No coughing or fever. Previous smoker, quit 19 years ago and 10 year smoking history.  Denies any associated lightheadedness/dizziness (feels alittle bit when she has a HA), chest pain, palpitations.  She has been doing less over the past several months in the setting of her many chronic conditions.  PERTINENT  PMH / PSH: Previous CVA, hypertension, migraine without aura, generalized convulsive epilepsy, COPD, cervical radiculopathy, rheumatoid arthritis, PTSD, fibromyalgia, depression/anxiety  OBJECTIVE:   BP 128/84   Pulse 70   Ht 5\' 5"  (1.651 m)   Wt 211 lb 3.2 oz (95.8 kg)   SpO2 97%   BMI 35.15 kg/m   General: Alert, NAD HEENT: NCAT, MMM, oropharynx nonerythematous  Cardiac: RRR no m/g/r Lungs: Clear bilaterally, no increased WOB at rest, however walked around the periphery of our clinic with tachypnea after approximately 10 feet.  Maintained pulse ox  of 97% >100 ft. No wheezing or decreased breath sounds heard. Abdomen: soft Msk: Moves all extremities spontaneously  Ext: Warm, dry, 2+ distal pulses, no pitting edema bilaterally Derm: Coalesced macular region underneath bilateral breast with erythematous periphery and central clearing.  Localized rash present on left lower anterior leg--small nonblanchable pinpoint macules.       ASSESSMENT/PLAN:   Candidal intertrigo Underneath bilateral breast, previously responded well to Lotrimin.  Rx Lotrimin BID x2 weeks and encouraged maintaining region dry.  Dyspnea on exertion Occurring for the past approximate one month in the setting of known COPD. Suspected pulmonary in nature with deconditioning, question poorly controlled COPD as she has not been using her Spiriva controller Hailer in quite some time.  Fortunately no desaturations upon exertion.  Reassured by recent normal Lexiscan Myoview/echo per cardiology in 03/2019 and 08/2019 with no associated CP, however with previous CVAs does have evidence of vascular disease.  Will obtain updated CXR and restart home Spiriva.  Will likely send back to cardiology for further evaluation if not improving with the above regimen.  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 Could also consider 2/2 inflammatory etiology, Rx triamcinolone BID to see if this would make a difference.  Follow-up if region is not improving or sooner if spreading, coalescing, development of fever.  Essential hypertension, benign Well-controlled.  Continue Coreg as it is.  [facility] (cerebral vascular accident) [facility]) Followed by neurology.  Obtaining a new brain MRI on 10/20 to rule out new stroke in the  setting of worsening migraines.  Doing well on increased Topamax dose.    Follow-up in 3 weeks for above, ED precautions discussed including further difficulty breathing, chest pain, increased  lightheadedness/dizziness with presyncopal/syncopal episode.  Patriciaann Clan, [place]

## 2020-08-03 NOTE — Patient Instructions (Signed)
It was wonderful to see you today.  For your breathing I recommend that you get a chest x-ray you can go to the [facility] imaging center on [address] at your convenience, you can walk-in to get this done.  I have also sent in a refill of your Spiriva which she should start taking on a daily basis to help with your breathing.  Lastly, I do think it be a good idea to get you to a cardiologist to make sure that this is not your heart, however I think it is likely your lungs.  I have also sent in 2 different creams.  The Lotrimin is for underneath your breast and that the triamcinolone is for the front of your leg.  If they are not improving please let me know.   Please follow-up in 3 weeks to check in on the above concerns, sooner if you have any further difficulty breathing, chest pain, lightheadedness/dizziness.

## 2020-08-04 ENCOUNTER — Encounter: Payer: Self-pay | Admitting: Family Medicine

## 2020-08-04 DIAGNOSIS — R21 Rash and other nonspecific skin eruption: Secondary | ICD-10-CM | POA: Insufficient documentation

## 2020-08-04 DIAGNOSIS — R0609 Other forms of dyspnea: Secondary | ICD-10-CM | POA: Insufficient documentation

## 2020-08-04 HISTORY — DX: Rash and other nonspecific skin eruption: R21

## 2020-08-04 NOTE — Assessment & Plan Note (Signed)
Well-controlled.  Continue Coreg as it is.

## 2020-08-04 NOTE — Assessment & Plan Note (Addendum)
Occurring for the past approximate one month in the setting of known COPD. Suspected pulmonary in nature with deconditioning, question poorly controlled COPD as she has not been using her Spiriva controller Hailer in quite some time.  Fortunately no desaturations upon exertion.  Reassured by recent normal Lexiscan Myoview/echo per cardiology in 03/2019 and 08/2019 with no associated CP, however with previous CVAs does have evidence of vascular disease.  Will obtain updated CXR and restart home Spiriva.  Will likely send back to cardiology for further evaluation if not improving with the above regimen.

## 2020-08-04 NOTE — Assessment & Plan Note (Signed)
Underneath bilateral breast, previously responded well to Lotrimin.  Rx Lotrimin BID x2 weeks and encouraged maintaining region dry.

## 2020-08-04 NOTE — Assessment & Plan Note (Signed)
Followed by neurology.  Obtaining a new brain MRI on 10/20 to rule out new stroke in the setting of worsening migraines.  Doing well on increased Topamax dose.

## 2020-08-04 NOTE — Assessment & Plan Note (Signed)
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 Could also consider 2/2 inflammatory etiology, Rx triamcinolone BID to see if this would make a difference.  Follow-up if region is not improving or sooner if spreading, coalescing, development of fever.

## 2020-08-10 ENCOUNTER — Ambulatory Visit
Admission: RE | Admit: 2020-08-10 | Discharge: 2020-08-10 | Disposition: A | Discharge status: Home / Self Care | Payer: Medicare Other | Source: Ambulatory Visit | Attending: Family Medicine | Admitting: Family Medicine

## 2020-08-10 ENCOUNTER — Ambulatory Visit
Admission: RE | Admit: 2020-08-10 | Discharge: 2020-08-10 | Disposition: A | Discharge status: Home / Self Care | Payer: Medicare Other | Source: Ambulatory Visit | Attending: Neurology | Admitting: Neurology

## 2020-08-10 ENCOUNTER — Other Ambulatory Visit: Payer: Self-pay

## 2020-08-10 ENCOUNTER — Other Ambulatory Visit: Payer: Self-pay | Admitting: Family Medicine

## 2020-08-10 DIAGNOSIS — I639 Cerebral infarction, unspecified: Secondary | ICD-10-CM

## 2020-08-10 DIAGNOSIS — J449 Chronic obstructive pulmonary disease, unspecified: Secondary | ICD-10-CM

## 2020-08-10 DIAGNOSIS — F32A Depression, unspecified: Secondary | ICD-10-CM

## 2020-08-12 ENCOUNTER — Encounter: Payer: Self-pay | Admitting: Family Medicine

## 2020-09-09 ENCOUNTER — Other Ambulatory Visit: Payer: Self-pay | Admitting: Neurology

## 2020-10-11 ENCOUNTER — Other Ambulatory Visit: Payer: Self-pay | Admitting: Family Medicine

## 2020-10-26 ENCOUNTER — Other Ambulatory Visit: Payer: Self-pay | Admitting: Family Medicine

## 2020-10-26 ENCOUNTER — Other Ambulatory Visit: Payer: Self-pay | Admitting: Neurology

## 2020-10-26 DIAGNOSIS — G8929 Other chronic pain: Secondary | ICD-10-CM

## 2020-11-09 ENCOUNTER — Telehealth (INDEPENDENT_AMBULATORY_CARE_PROVIDER_SITE_OTHER): Payer: Medicare Other | Admitting: Family Medicine

## 2020-11-09 ENCOUNTER — Other Ambulatory Visit: Payer: Self-pay

## 2020-11-09 DIAGNOSIS — R058 Other specified cough: Secondary | ICD-10-CM | POA: Diagnosis present

## 2020-11-09 HISTORY — DX: Other specified cough: R05.8

## 2020-11-09 MED ORDER — PREDNISONE 20 MG PO TABS: 40.0000 mg | ORAL_TABLET | Freq: Every day | ORAL | 0 refills | Status: AC

## 2020-11-09 MED ORDER — BENZONATATE 100 MG PO CAPS: 100.0000 mg | ORAL_CAPSULE | Freq: Two times a day (BID) | ORAL | 0 refills | Status: DC | PRN

## 2020-11-09 MED ORDER — AZITHROMYCIN 250 MG PO TABS: ORAL_TABLET | ORAL | 0 refills | Status: DC

## 2020-11-09 NOTE — Progress Notes (Signed)
[facility] Visit  Patient consented to have virtual visit and was identified by name and date of birth. Method of visit: Video  Encounter participants: Patient: [name] - located at home Provider: Danna Hefty - located at home Others (if applicable): none  Chief Complaint: persistent productive cough  HPI: Productive cough x 2 weeks, yellow productive cough with occasional bloody phlegm. Temp up to 99.9. Endorses wheezing, difficulty sleeping, headache, and congestion. Using Spiriva inhaler. Sister has had similar symptoms but her test was negative. Her daughter has also been tested due to similar symptoms and they will come back today. Denies any changes in her breathing. COVID vaccinated x 3. Has not flu vaccine. Denies any body aches.   ROS: per HPI  Pertinent PMHx: COPD, reflux esophagitis,   Exam:  There were no vitals taken for this visit.  Respiratory: raspy voice and wet cough appreciated throughout exam, speaking in full sentences   Assessment/Plan:  Productive cough Productive wet cough x 2 weeks. Tactile fever without true temperature. Most concerning differentials include COPD exacerbation vs pneumonia vs COVID. No SOB which is reassuring. Will treat patient for both COPD exacerbation and pneumonia.  - Prednisone 40mg  QD x 5 days - Z-pack x 5 days  - Encouraged COVID testing - If no improvement within 3-4 days, will recommended CXR and in-person evaluation - Continue Spiriva inhaler as prescribed - Tessalon pearls  - Symptomatic treatment recommendations discussed including honey, chloraseptic spray, cough drops, nasal saline drops, mucinex    Time spent during visit with patient: 20 minutes  Mina Marble, DO [place], PGY3 11/09/2020 3:34 PM

## 2020-11-09 NOTE — Assessment & Plan Note (Addendum)
Productive wet cough x 2 weeks. Tactile fever without true temperature. Most concerning differentials include COPD exacerbation vs pneumonia vs COVID. No SOB which is reassuring. Will treat patient for both COPD exacerbation and pneumonia.  - Prednisone 40mg  QD x 5 days - Z-pack x 5 days  - Encouraged COVID testing - If no improvement within 3-4 days, will recommended CXR and in-person evaluation - Continue Spiriva inhaler as prescribed - Tessalon pearls  - Symptomatic treatment recommendations discussed including honey, chloraseptic spray, cough drops, nasal saline drops, mucinex

## 2020-11-11 ENCOUNTER — Other Ambulatory Visit: Payer: Self-pay | Admitting: Family Medicine

## 2020-11-11 DIAGNOSIS — F32A Depression, unspecified: Secondary | ICD-10-CM

## 2020-11-29 ENCOUNTER — Other Ambulatory Visit: Payer: Self-pay | Admitting: Family Medicine

## 2020-12-08 ENCOUNTER — Encounter: Payer: Self-pay | Admitting: Neurology

## 2020-12-08 ENCOUNTER — Ambulatory Visit (INDEPENDENT_AMBULATORY_CARE_PROVIDER_SITE_OTHER): Payer: Medicare Other | Admitting: Neurology

## 2020-12-08 VITALS — BP 155/92 | HR 75 | Ht 65.0 in | Wt 212.0 lb

## 2020-12-08 DIAGNOSIS — G43009 Migraine without aura, not intractable, without status migrainosus: Secondary | ICD-10-CM | POA: Diagnosis not present

## 2020-12-08 DIAGNOSIS — R569 Unspecified convulsions: Secondary | ICD-10-CM

## 2020-12-08 MED ORDER — AIMOVIG 140 MG/ML ~~LOC~~ SOAJ: 140.0000 mg | SUBCUTANEOUS | 4 refills | Status: DC

## 2020-12-08 MED ORDER — UBRELVY 50 MG PO TABS: 50.0000 mg | ORAL_TABLET | Freq: Two times a day (BID) | ORAL | 2 refills | Status: DC | PRN

## 2020-12-08 NOTE — Progress Notes (Signed)
Reason for visit: History of seizures, cerebrovascular disease, headache  [name] is an 70 y.o. 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  The patient has had some difficulty walking in part due to troubles with severe bilateral knee arthritis.  She claims that she is seeing someone for this, she is using a walker to get around.  She is on phenobarbital, she has not had any recent seizures, she tolerates the medication well.  She is having daily headaches at this point but goes on to say that she is taking 8 BC powders on average daily.  She will wake up with a headache in the morning which is the worst time of day for her, and then start taking the [facility] powders throughout the day.  The patient continues to report some decreased visual acuity in the left eye, she has seen ophthalmology concerning this.  She will be getting some glasses in the near future.  The patient has not had any falls, again she uses a walker regularly.  Her headaches tend to be on the top of the head, they are associated with photophobia and phonophobia and some occasional nausea.  Past Medical History:  Diagnosis Date  . Asthma   . Chronic low back pain   . Collagen vascular disease ([place])   . 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  . COPD (chronic obstructive pulmonary disease) ([place])   . Depression   . Ear pain, bilateral 03/30/2019  . Fibromyalgia    08/18/15  [facility]  . GERD (gastroesophageal reflux disease)   . History of CVA (cerebrovascular accident)    "66 minni srokes"  . Hyperlipidemia   . Hypertension   . Migraine   . Personal history of colonic polyp - adenoma 03/01/2014  . Personality disorder ([place])   . Rheumatoid arthritis ([place])    08/18/15  [facility]  . Seizures ([place])    last seizure 4 years ago  . Sleep apnea    Mild. No  need for CPAP.  Marland Kitchen Stroke [facility])     Past Surgical History:  Procedure Laterality Date  . ANKLE SURGERY Left   . APPENDECTOMY    . BACK SURGERY    . BUNIONECTOMY     2 toes  . CARDIAC CATHETERIZATION  2008   Normal. Dr Acie Fredrickson.  . CHOLECYSTECTOMY    . CYST EXCISION     leg  . ESOPHAGOGASTRODUODENOSCOPY (EGD) WITH PROPOFOL N/A 06/27/2018   Procedure: ESOPHAGOGASTRODUODENOSCOPY (EGD) WITH PROPOFOL;  Surgeon: Jonathon Bellows, MD;  Location: [facility] ENDOSCOPY;  Service: Gastroenterology;  Laterality: N/A;  . FLANK MASS EXCISION    . FOOT TENDON SURGERY  june 2014   both feet  . KNEE SURGERY     Bilateral.  . NOSE SURGERY    . SHOULDER SURGERY     bil.  Marland Kitchen TOTAL ABDOMINAL HYSTERECTOMY      Family History  Problem Relation Age of Onset  . Cirrhosis Mother   . Diabetes Mother   . Cirrhosis Father   . Breast cancer Sister 28  . Breast cancer Sister 70  . Colon cancer Neg Hx     Social history:  reports that she has quit smoking. She has never used smokeless tobacco. She reports that she does not drink alcohol and does not use drugs.    Allergies  Allergen Reactions  . Amoxicillin     REACTION: hives  . Aspirin     REACTION: rash  . Codeine Phosphate     REACTION: N/V  . Flexeril [Cyclobenzaprine] Nausea And Vomiting    Dizziness   . Ibuprofen Hives  . Penicillins     REACTION: hives  . Tetanus Toxoids Swelling    Medications:  Prior to Admission medications   Medication Sig Start Date End Date Taking? Authorizing Provider  albuterol (PROVENTIL HFA;VENTOLIN HFA) 108 (90 Base) MCG/ACT inhaler INHALE 2 PUFFS INTO THE LUNGS EVERY 6 HOURS AS NEEDED FOR WHEEZING ORSHORTNESS OF BREATH. Patient taking differently: Inhale 2 puffs into the lungs every 6 (six) hours as needed for wheezing or shortness of breath. 01/09/19  Yes Lovenia Kim, MD  aspirin EC 81 MG EC tablet Take 1 tablet (81 mg total) by mouth daily. 09/12/19  Yes Vann, Jessica U, DO  atorvastatin (LIPITOR) 80 MG tablet  TAKE 1 TABLET BY MOUTH ONCE DAILY AT 6PM 09/12/20  Yes Suzzanne Cloud, NP  carvedilol (COREG) 3.125 MG tablet Take 1 tablet (3.125 mg total) by mouth 2 (two) times daily with a meal. 06/29/20  Yes Beard, Samantha N, DO  citalopram (CELEXA) 20 MG tablet TAKE 1 TABLET BY MOUTH ONCE DAILY 11/11/20  Yes Darrelyn Hillock N, DO  clotrimazole (LOTRIMIN) 1 % cream Apply 1 application topically 2 (two) times daily. Under breasts 08/03/20  Yes Beard, Samantha N, DO  folic acid (FOLVITE) 1 MG tablet TAKE 1 TABLET BY MOUTH ONCE A DAY 10/11/20  Yes Beard, Samantha N, DO  gabapentin (NEURONTIN) 300 MG capsule TAKE 3 CAPSULES BY MOUTH TWICE DAILY 10/26/20  Yes Patriciaann Clan, DO  HYDROcodone-acetaminophen (NORCO) 10-325 MG tablet Take 1-2 tablets by mouth See admin instructions. 1 tablet in the morning and 2 tablets at night 08/28/19  Yes [provider]  omeprazole (PRILOSEC) 20 MG capsule TAKE 1 CAPSULE BY MOUTH ONCE DAILY 11/30/20  Yes Beard, Samantha N, DO  PHENObarbital (LUMINAL) 100 MG tablet TAKE 1 TABLET BY MOUTH ONCE DAILY 10/27/20  Yes Suzzanne Cloud, NP  tiotropium (SPIRIVA HANDIHALER) 18 MCG inhalation capsule INHALE THE CONTENTS OF 1 CAPSULE VIA HANDIHALER BY MOUTH ONCE DAILY 08/03/20  Yes Patriciaann Clan, DO  topiramate (TOPAMAX) 50 MG tablet Take 2 tablets twice daily 07/28/20  Yes Suzzanne Cloud, NP  triamcinolone ointment (KENALOG) 0.1 % Apply 1 application topically 2 (two) times daily. Apply to front of your lower leg 08/03/20  Yes Darrelyn Hillock N, DO  alendronate (FOSAMAX) 70 MG tablet TAKE 1 TABLET BY MOUTH ONCE WEEKLY 06/29/20   Patriciaann Clan, DO  azithromycin (ZITHROMAX Z-PAK) 250 MG tablet Take 500mg  (2 tablets) on day 1, then 250mg  (1 tablet) on day 2-5 11/09/20   Mullis, Kiersten P, DO  benzonatate (TESSALON) 100 MG capsule Take 1 capsule (100 mg total) by mouth 2 (two) times daily as needed for cough. 11/09/20   Mullis, Kiersten P, DO    ROS:  Out of a complete 14 system review of  symptoms, the patient complains only of the following symptoms, and all other reviewed systems are negative.  Headache Knee pain Walking difficulty Vision problems  Blood pressure (!) 155/92, pulse 75, height 5\' 5"  (1.651 m), weight 212 lb (96.2 kg).  Physical Exam  General: The patient is alert and cooperative at the time of the examination.  The patient is moderately obese.  Skin: No significant peripheral edema is noted.  Neurologic Exam  Mental status: The patient is alert and oriented x 3 at the time of the examination. The patient has apparent normal recent and remote memory, with an apparently normal attention span and concentration ability.   Cranial nerves: Facial symmetry is present. Speech is normal, no aphasia or dysarthria is noted. Extraocular movements are full. Visual fields are full, with exception of there may be a left inferior quadrantanopsia.  Motor: The patient has good strength in all 4 extremities.  Sensory examination: Soft touch sensation is symmetric on the face, arms, and legs.  Coordination: The patient has good finger-nose-finger and heel-to-shin bilaterally.  Gait and station: The patient has a slightly wide-based gait, she uses a walker for ambulation.  Reflexes: Deep tendon reflexes are symmetric.   Assessment/Plan:  1.  Cerebrovascular disease  2.  Daily headache  3.  History of seizures, well controlled  The patient will continue her phenobarbital.  She is on Topamax for her headaches which is no longer effective.  She is taking 8 BC powders daily on average, and likely is having some rebound headaches from this.  I indicated that she needs to stop the [facility] powders, I will give her a prescription for Ubrelvy to take if needed, she has intolerance or allergy to other nonsteroidal anti-inflammatory medications.  The patient will be placed on Aimovig for the migraine.  She will follow up here in 6 months or sooner if needed.  Paula Alexanders  MD 12/08/2020 3:10 PM  [facility] [address] [place], [place] [address]  Phone [phone] Fax [phone]

## 2020-12-08 NOTE — Patient Instructions (Signed)
We will start Aimovig 140 mg every thirty days to prevent headache,take Ubrelvy 50 mg up to twice a day if needed for the headache.

## 2020-12-11 IMAGING — CT CT HEAD W/O CM
4 series · 16 of 47 positions shown, 18 images · non-contrast
Comparison: September 11, 2017

CLINICAL DATA: Speech difficulty

EXAM:
CT HEAD WITHOUT CONTRAST
TECHNIQUE: Contiguous axial images were obtained from the base of the skull
through the vertex without intravenous contrast.

[Series 2: head without · axial · non-contrast · 0.43mm/px · z∈[-410,-300]mm · 6 of 32 slices shown, 8 images]
[im 5/32  brain]
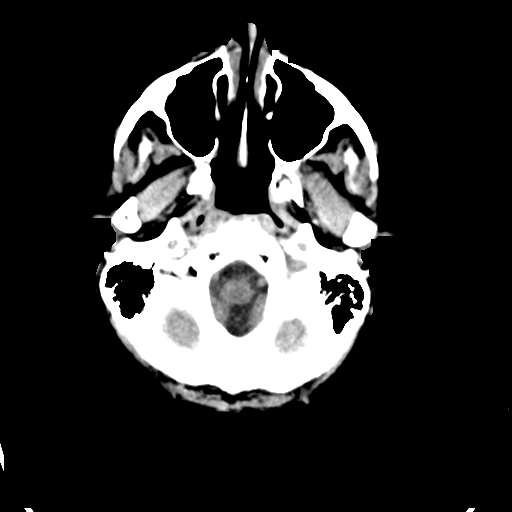
[im 5/32  bone]
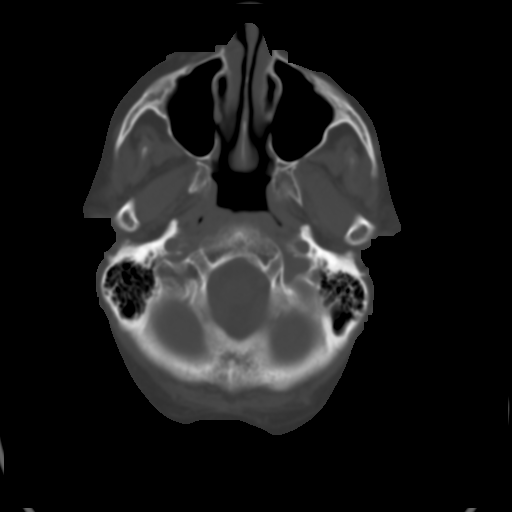
[im 9/32  brain]
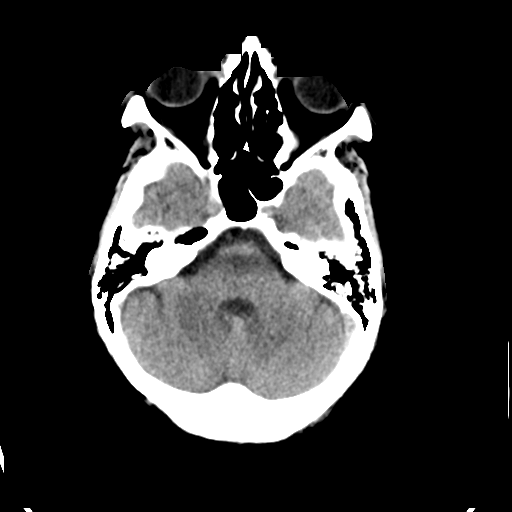
[im 14/32  brain]
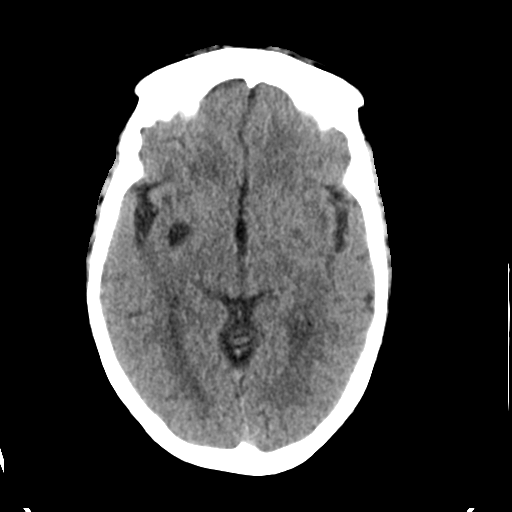
[im 18/32  brain]
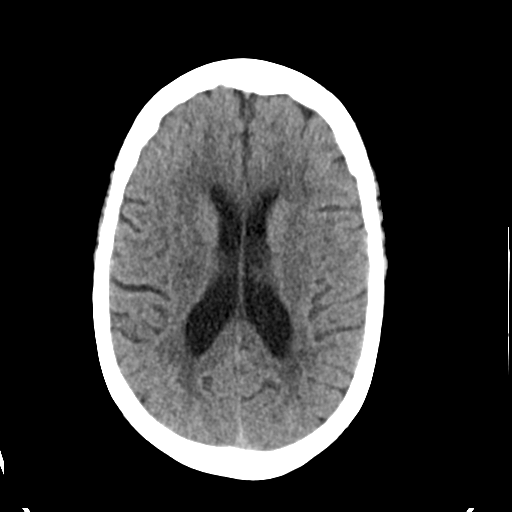
[im 23/32  brain]
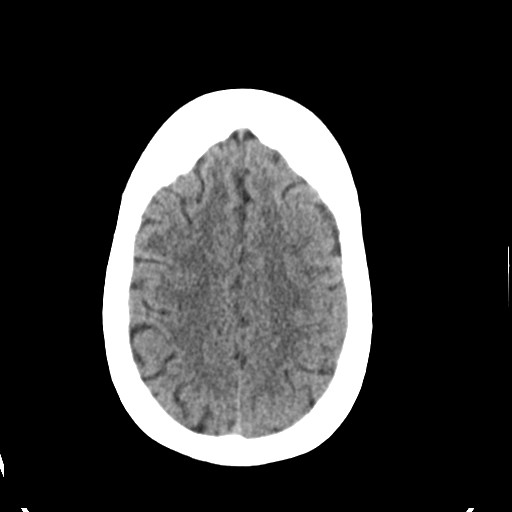
[im 23/32  bone]
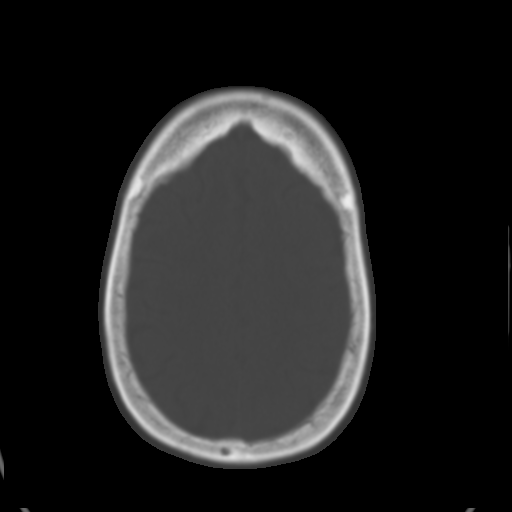
[im 27/32  brain]
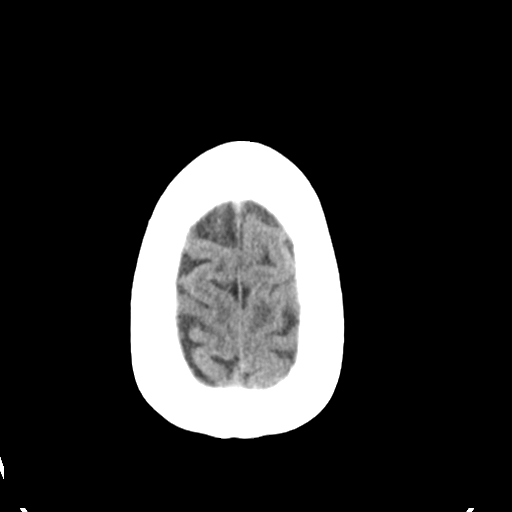

[Series 3: head bone · axial · 0.43mm/px · z∈[-416,-362]mm · 4 of 82 slices shown]
[im 8/82  bone]
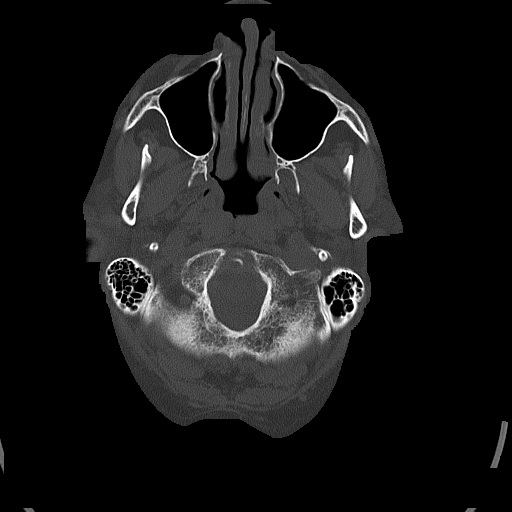
[im 16/82  bone]
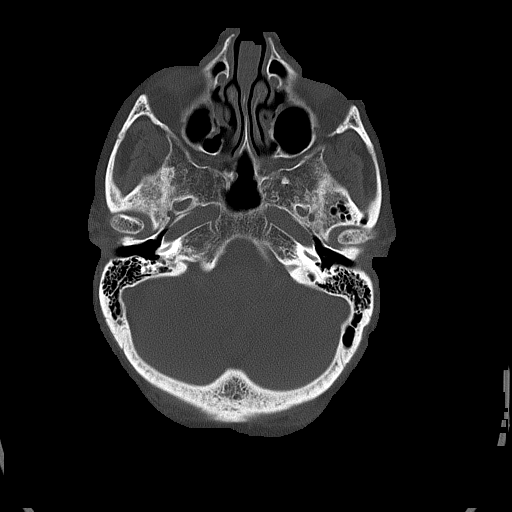
[im 28/82  bone]
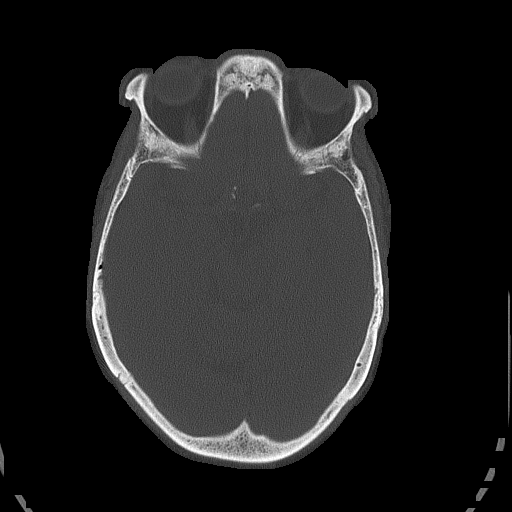
[im 35/82  bone]
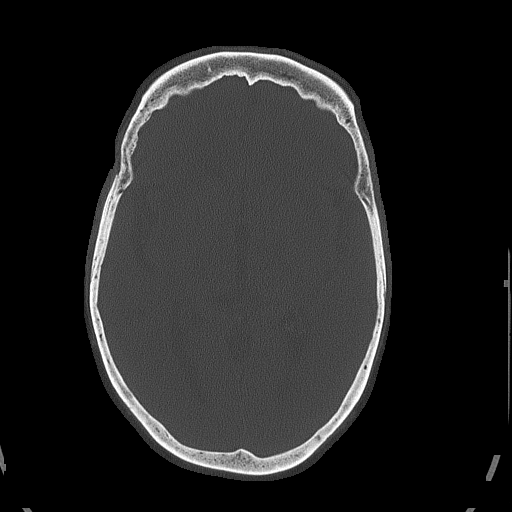

[Series 4: head without cor · coronal · non-contrast · 0.33mm/px · 3 of 70 slices shown]
[im 24/70  brain]
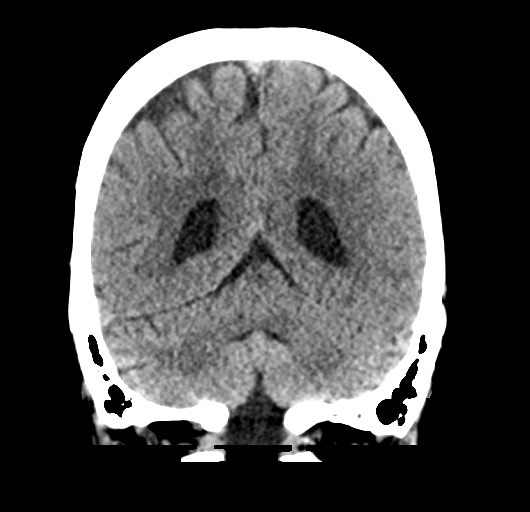
[im 31/70  brain]
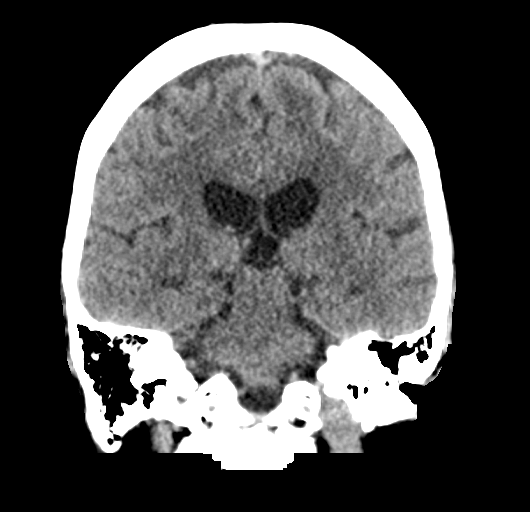
[im 39/70  brain]
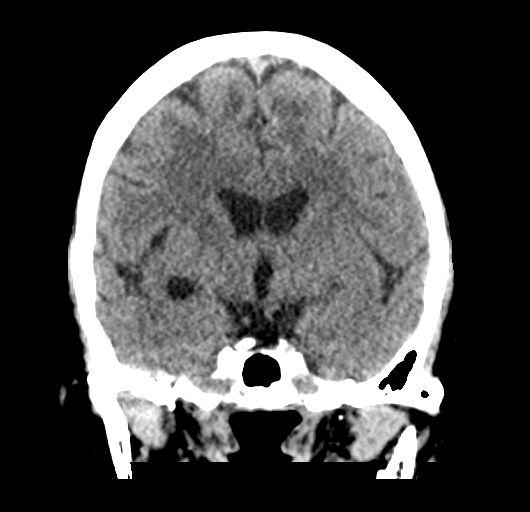

[Series 5: head without sag · sagittal · non-contrast · 0.31mm/px · 3 of 59 slices shown]
[im 20/59  brain]
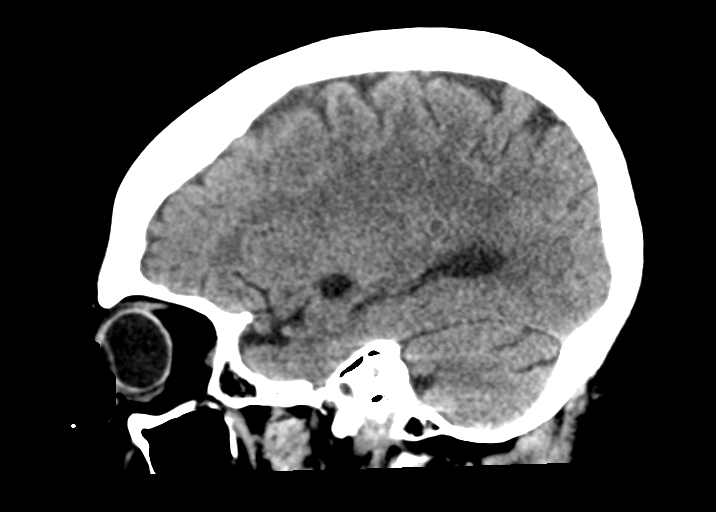
[im 30/59  brain]
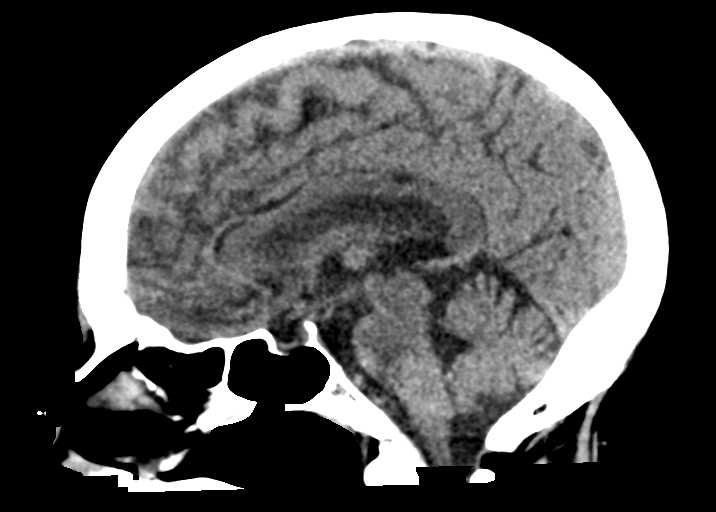
[im 39/59  brain]
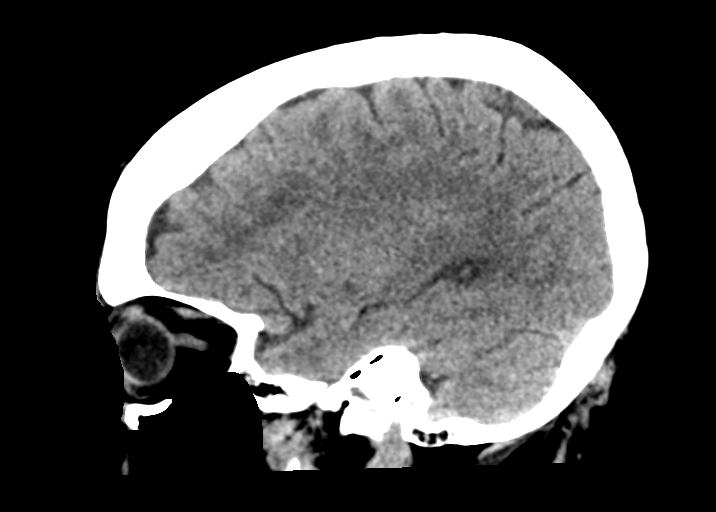

[16 of 47 positions shown; findings below may reference images not displayed]

FINDINGS: Brain: No evidence of acute territorial infarction, hemorrhage,
hydrocephalus,extra-axial collection or mass lesion/mass effect.
There is dilatation the ventricles and sulci consistent with
age-related atrophy. Low-attenuation changes in the deep white
matter consistent with small vessel ischemia. Again noted is a
lacunar infarct within the right basal ganglia.

Vascular: No hyperdense vessel or unexpected calcification.

Skull: The skull is intact. No fracture or focal lesion identified.

Sinuses/Orbits: The visualized paranasal sinuses and mastoid air
cells are clear. The orbits and globes intact.

Other: None
IMPRESSION: No acute intracranial abnormality.

Findings consistent with age related atrophy and chronic small
vessel ischemia

Lacunar infarct in the right basal ganglia.

## 2020-12-11 IMAGING — DX DG FEMUR 2+V*R*
4 series · 4 of 4 positions shown · non-contrast
Comparison: None.

CLINICAL DATA: Fall, hip pain

EXAM:
RIGHT FEMUR 2 VIEWS

[femur ap (1 of 2)]
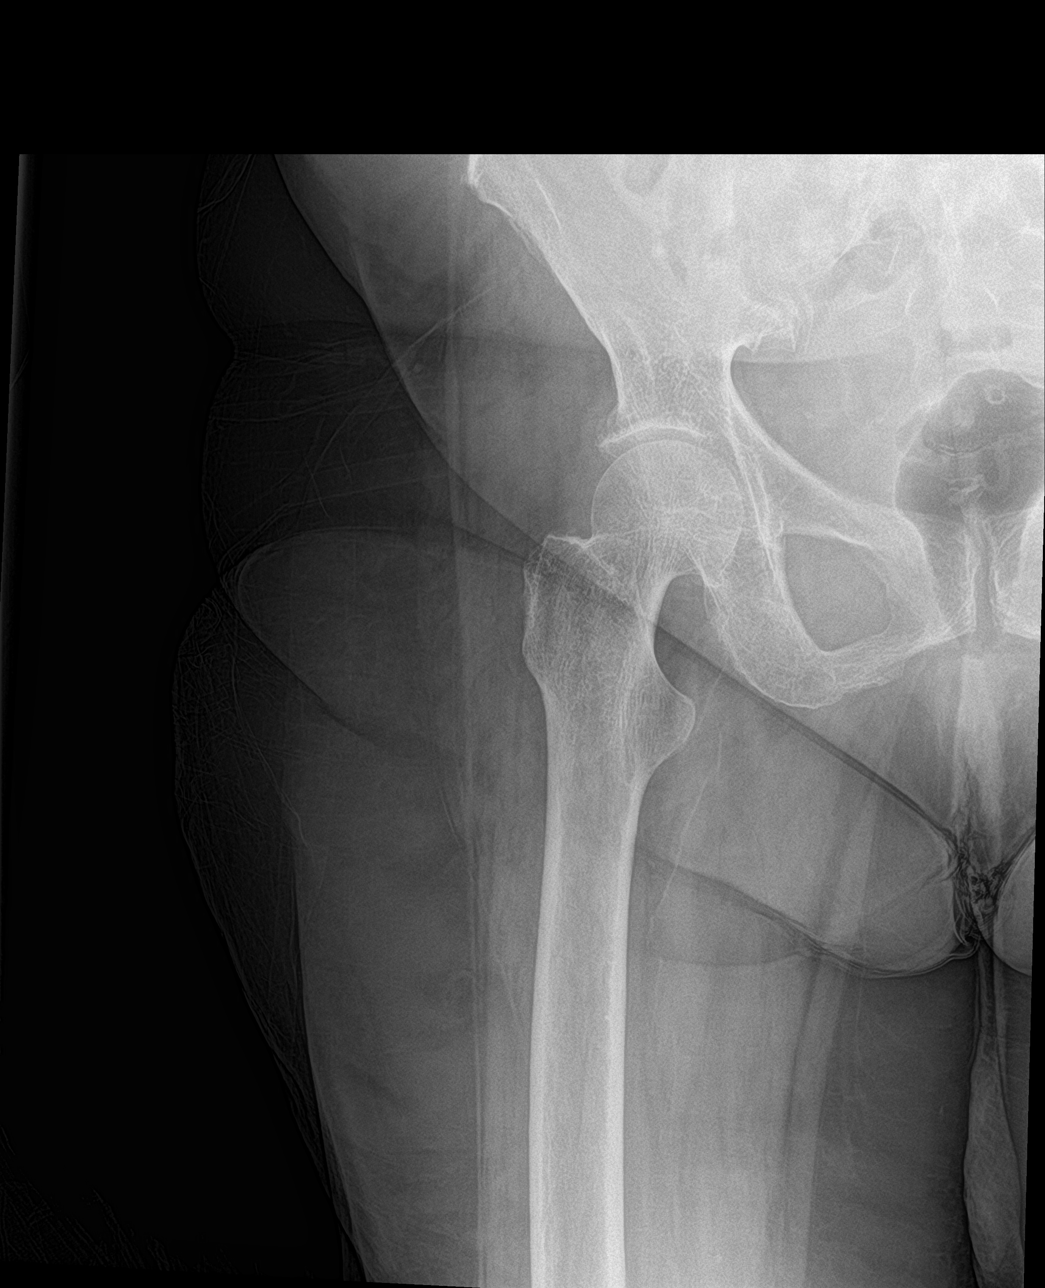

[femur ap (2 of 2)]
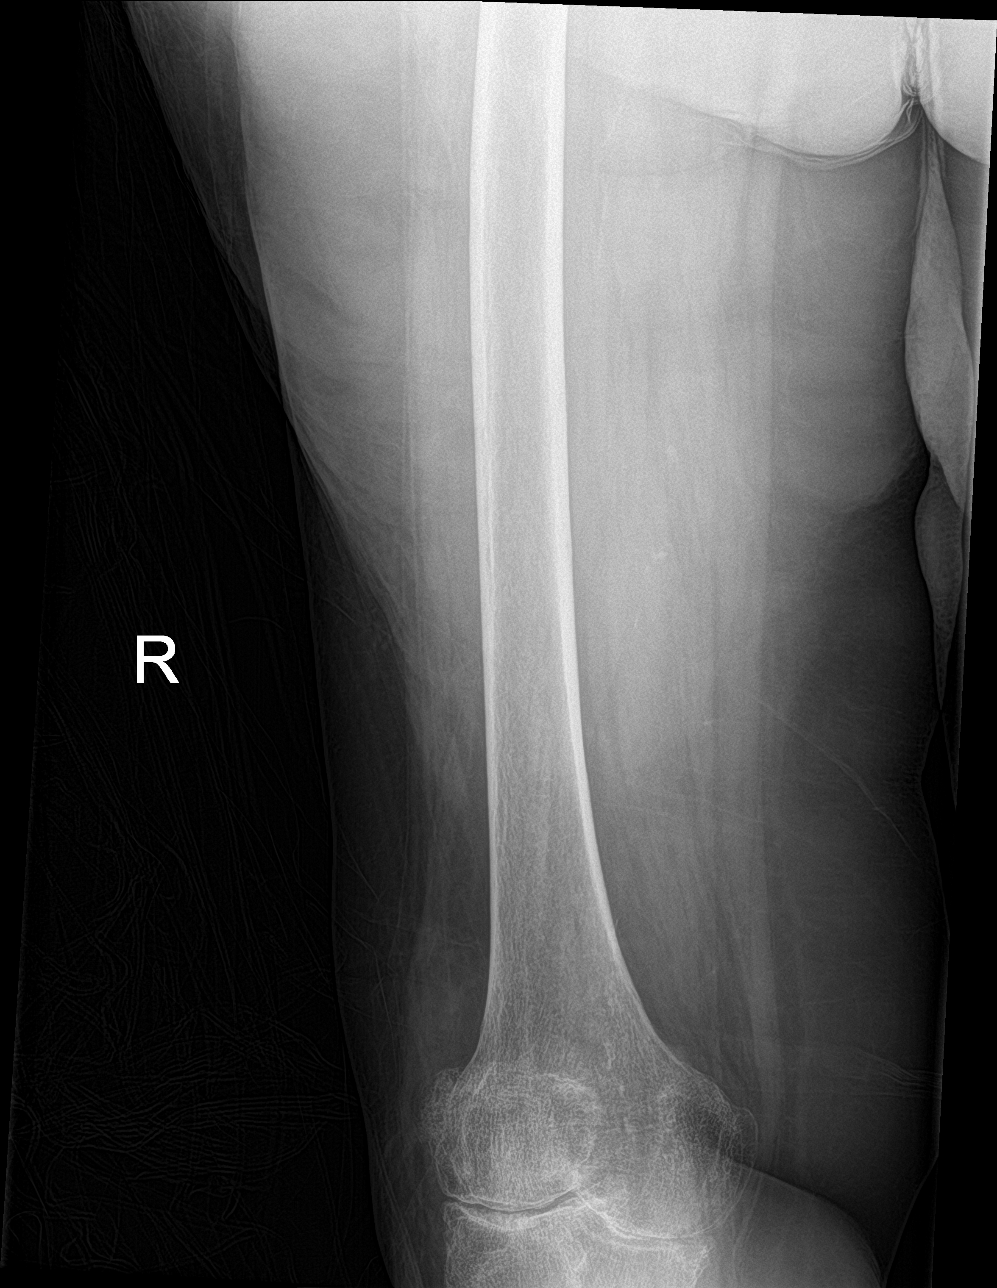

[femur lat (1 of 2)]
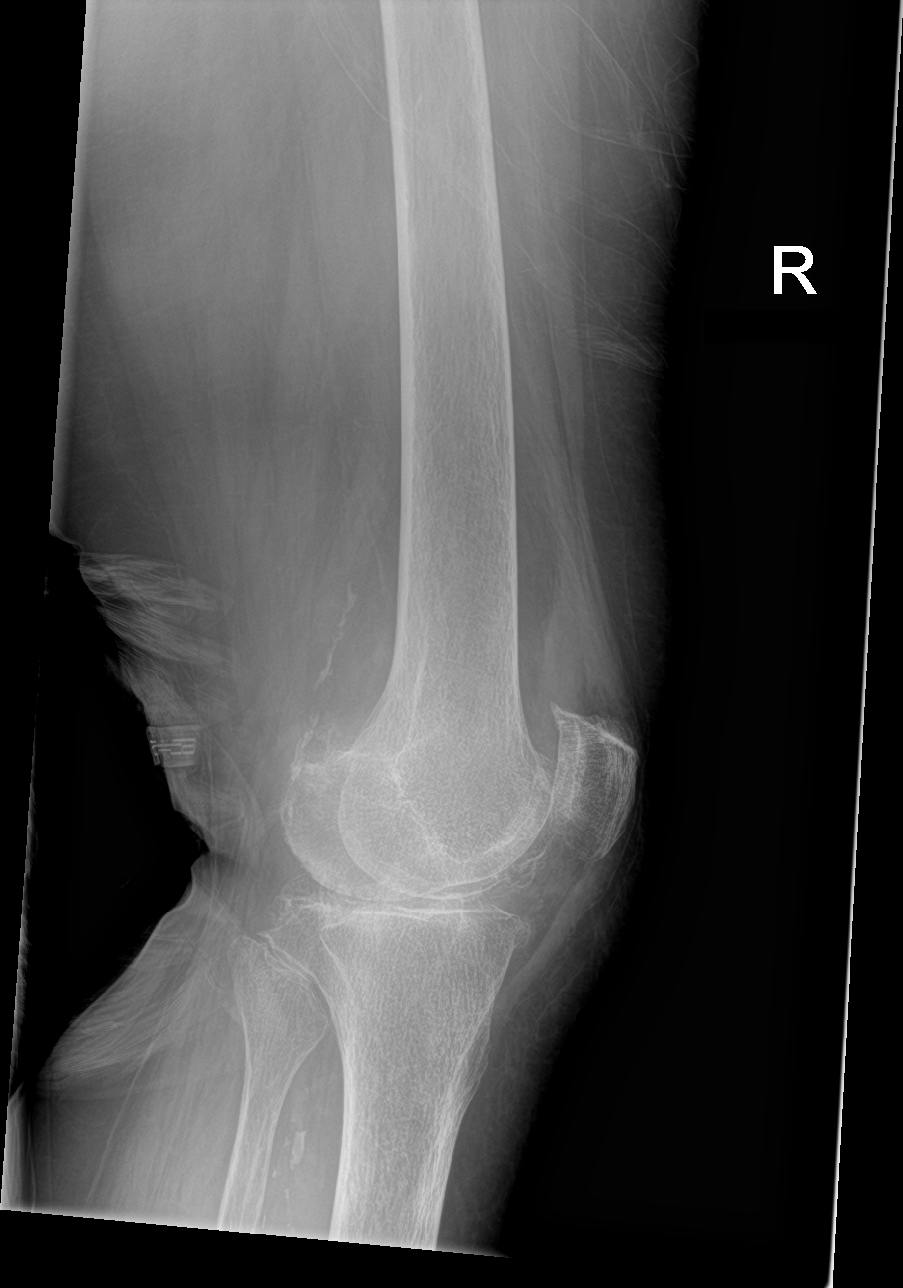

[femur lat (2 of 2)]
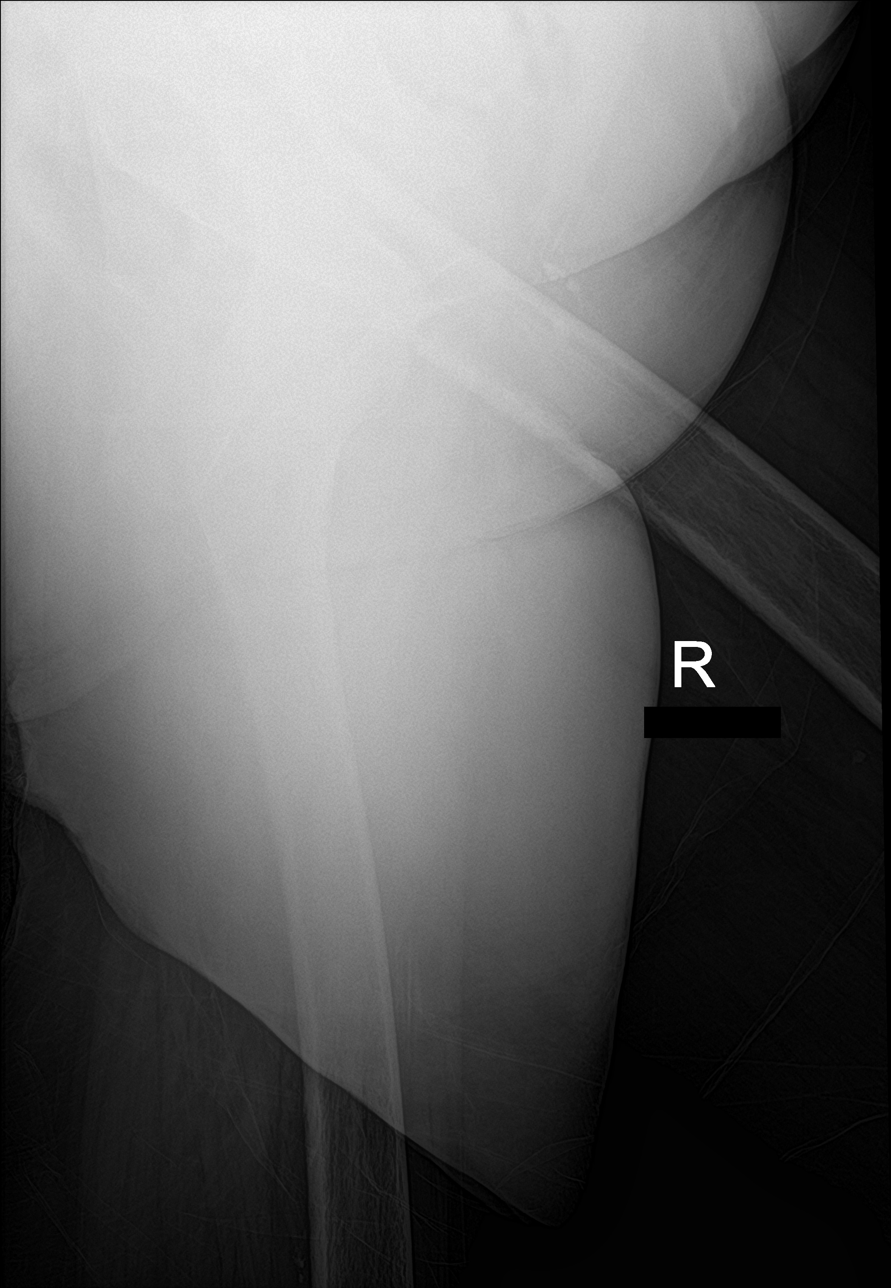

[4 of 4 positions shown; findings below may reference images not displayed]

FINDINGS: Advanced arthritis in the right knee. Joint space narrowing and
spurring. Small joint effusion. No acute bony abnormality.
Specifically, no fracture, subluxation, or dislocation.
IMPRESSION: No acute bony abnormality. Advanced degenerative changes in the
right knee with small joint effusion.

## 2020-12-11 IMAGING — DX DG PELVIS 1-2V
1 series · 1 of 1 positions shown · non-contrast
Comparison: CT 06/10/2018

CLINICAL DATA: Fall with hip pain

EXAM:
PELVIS - 1-2 VIEW

[pelvis ap]
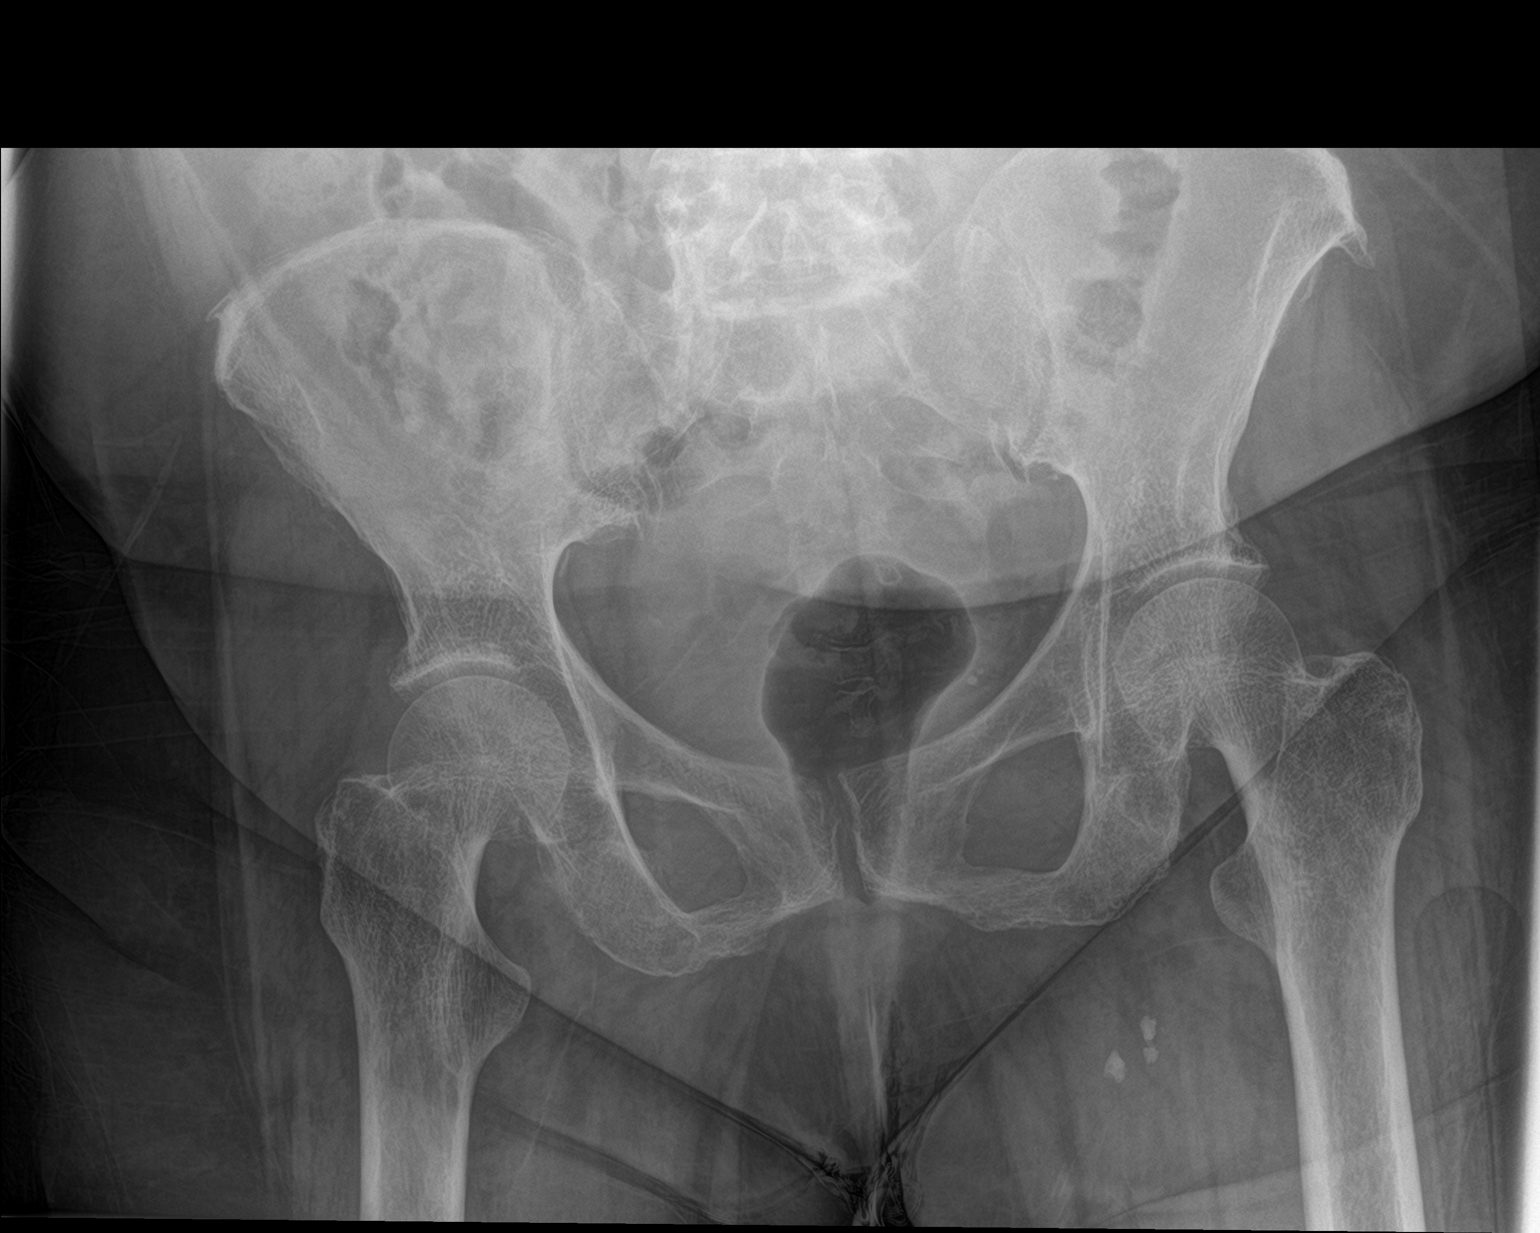

[1 of 1 positions shown; findings below may reference images not displayed]

FINDINGS: SI joints are non widened. The pubic symphysis and rami appear
intact. No fracture or malalignment. Right femoral neck is partially
obscured by the trochanter
IMPRESSION: No definite acute osseous abnormality

## 2020-12-12 ENCOUNTER — Telehealth: Payer: Self-pay | Admitting: Emergency Medicine

## 2020-12-12 NOTE — Telephone Encounter (Signed)
PA for Aimovig completed on CMM Key: [id number] Awaiting determination from [facility]

## 2020-12-12 NOTE — Telephone Encounter (Signed)
PA started for Ubrelvy on CMM. Key: [id number]  Awaiting determination from [facility].

## 2020-12-14 NOTE — Telephone Encounter (Signed)
PA Case: [id number], Status: Approved, Coverage Starts on: 10/22/2020 12:00:00 AM, Coverage Ends on: 10/21/2021 12:00:00 AM. Questions? Contact [phone].

## 2020-12-15 NOTE — Telephone Encounter (Signed)
The drug you asked for is non-formulary (not on [employer] list of preferred drugs).  Patient called and notified of denial.

## 2020-12-26 ENCOUNTER — Other Ambulatory Visit: Payer: Self-pay | Admitting: Family Medicine

## 2020-12-27 ENCOUNTER — Emergency Department
Admission: EM | Admit: 2020-12-27 | Discharge: 2020-12-27 | Disposition: A | Discharge status: Home / Self Care | Payer: Medicare Other | Attending: Emergency Medicine | Admitting: Emergency Medicine

## 2020-12-27 ENCOUNTER — Other Ambulatory Visit: Payer: Self-pay

## 2020-12-27 ENCOUNTER — Encounter: Payer: Self-pay | Admitting: Emergency Medicine

## 2020-12-27 ENCOUNTER — Emergency Department: Payer: Medicare Other

## 2020-12-27 DIAGNOSIS — R109 Unspecified abdominal pain: Secondary | ICD-10-CM | POA: Diagnosis not present

## 2020-12-27 DIAGNOSIS — R112 Nausea with vomiting, unspecified: Secondary | ICD-10-CM | POA: Insufficient documentation

## 2020-12-27 DIAGNOSIS — M79605 Pain in left leg: Secondary | ICD-10-CM | POA: Diagnosis not present

## 2020-12-27 DIAGNOSIS — Z7982 Long term (current) use of aspirin: Secondary | ICD-10-CM | POA: Insufficient documentation

## 2020-12-27 DIAGNOSIS — I2510 Atherosclerotic heart disease of native coronary artery without angina pectoris: Secondary | ICD-10-CM | POA: Diagnosis not present

## 2020-12-27 DIAGNOSIS — R0602 Shortness of breath: Secondary | ICD-10-CM | POA: Insufficient documentation

## 2020-12-27 DIAGNOSIS — J45909 Unspecified asthma, uncomplicated: Secondary | ICD-10-CM | POA: Diagnosis not present

## 2020-12-27 DIAGNOSIS — R079 Chest pain, unspecified: Secondary | ICD-10-CM | POA: Insufficient documentation

## 2020-12-27 DIAGNOSIS — J449 Chronic obstructive pulmonary disease, unspecified: Secondary | ICD-10-CM | POA: Insufficient documentation

## 2020-12-27 DIAGNOSIS — I10 Essential (primary) hypertension: Secondary | ICD-10-CM | POA: Diagnosis not present

## 2020-12-27 DIAGNOSIS — R509 Fever, unspecified: Secondary | ICD-10-CM | POA: Diagnosis not present

## 2020-12-27 DIAGNOSIS — M79604 Pain in right leg: Secondary | ICD-10-CM

## 2020-12-27 DIAGNOSIS — Z79899 Other long term (current) drug therapy: Secondary | ICD-10-CM | POA: Insufficient documentation

## 2020-12-27 DIAGNOSIS — Z7951 Long term (current) use of inhaled steroids: Secondary | ICD-10-CM | POA: Diagnosis not present

## 2020-12-27 DIAGNOSIS — Z87891 Personal history of nicotine dependence: Secondary | ICD-10-CM | POA: Insufficient documentation

## 2020-12-27 LAB — BASIC METABOLIC PANEL
Anion gap: 6 (ref 5–15)
BUN: 14 mg/dL (ref 8–23)
CO2: 21 mmol/L — ABNORMAL LOW (ref 22–32)
Calcium: 8.1 mg/dL — ABNORMAL LOW (ref 8.9–10.3)
Chloride: 111 mmol/L (ref 98–111)
Creatinine, Ser: 0.69 mg/dL (ref 0.44–1.00)
GFR, Estimated: >60 mL/min (ref >60)
Glucose, Bld: 102 mg/dL — ABNORMAL HIGH (ref 70–99)
Potassium: 4.3 mmol/L (ref 3.5–5.1)
Sodium: 138 mmol/L (ref 135–145)

## 2020-12-27 LAB — CBC
HCT: 33.5 % — ABNORMAL LOW (ref 36.0–46.0)
Hemoglobin: 10.5 g/dL — ABNORMAL LOW (ref 12.0–15.0)
MCH: 26.4 pg (ref 26.0–34.0)
MCHC: 31.3 g/dL (ref 30.0–36.0)
MCV: 84.2 fL (ref 80.0–100.0)
Platelets: 195 10*3/uL (ref 150–400)
RBC: 3.98 MIL/uL (ref 3.87–5.11)
RDW: 16.6 % — ABNORMAL HIGH (ref 11.5–15.5)
WBC: 7.1 10*3/uL (ref 4.0–10.5)
nRBC: 0 % (ref 0.0–0.2)

## 2020-12-27 LAB — BRAIN NATRIURETIC PEPTIDE: B Natriuretic Peptide: 95.8 pg/mL (ref 0.0–100.0)

## 2020-12-27 NOTE — ED Notes (Signed)
Pt has pain in both lower legs.  Pt concerned of blood clots in legs.  No known injury to legs.  Denies chest pain or sob.  Pt alert  Speech clear.

## 2020-12-27 NOTE — ED Triage Notes (Addendum)
Pt via POV from home. Pt was seen at the pain clinic and provider told her to go to the ER for a possible DVT. Pt has been having bilateral pain and swelling in her leg. Pt states that they did a test on her leg (Homans Sign) and it caused her pain so they told her to come here. Denies blood thinner use. Pt is A&Ox4 and NAD.

## 2020-12-27 NOTE — ED Provider Notes (Signed)
[facility] Emergency Department Provider Note   ____________________________________________   I have reviewed the triage vital signs and the nursing notes.   HISTORY  Chief Complaint Leg Pain   History limited by: Not Limited   HPI [name] is a 70 y.o. female who presents to the emergency department today because of concerns for leg pain.  The patient states she has been having pain behind both of her knees for the past week or so.  She denies any unusual exertion or activity before the pain is started.  She states that it has interfered with her ability to ambulate.  She denies similar pain in the past although has required surgery to her knees and feet in the past.  Patient went to pain clinic today where they had concerns for possible DVT.  Patient states that she has been having fevers, chest pain, shortness of breath, abdominal pain, nausea and vomiting.  She denies any urinary symptoms.    Records reviewed. Per medical record review patient has a history of chronic low back pain.   Past Medical History:  Diagnosis Date  . Asthma   . Chronic low back pain   . Collagen vascular disease ([place])   . 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  . COPD (chronic obstructive pulmonary disease) ([place])   . Depression   . Ear pain, bilateral 03/30/2019  . Fibromyalgia    08/18/15  [facility]  . GERD (gastroesophageal reflux disease)   . History of CVA (cerebrovascular accident)    "85 minni srokes"  . Hyperlipidemia   . Hypertension   . Migraine   . Personal history of colonic polyp - adenoma 03/01/2014  . Personality disorder ([place])   . Rheumatoid arthritis ([place])    08/18/15  [facility]  . Seizures ([place])    last seizure 4 years ago  . Sleep apnea    Mild. No need for CPAP.  Marland Kitchen Stroke [facility])     Patient Active Problem List   Diagnosis Date Noted  . Productive cough 11/09/2020  . Dyspnea on exertion 08/04/2020  . Rash and  nonspecific skin eruption 08/04/2020  . CVA (cerebral vascular accident) ([place]) 09/09/2019  . Coronary artery disease 10/16/2018  . Nasal congestion 08/15/2017  . Hypersomnia with sleep apnea 04/18/2017  . Snoring 04/18/2017  . Post traumatic stress disorder (PTSD) 04/18/2017  . COPD mixed type ([place]) 04/18/2017  . Somnolence, daytime 02/26/2017  . Cervical paraspinal muscle spasm 10/26/2016  . Lower back pain 05/27/2016  . Left hip pain 05/27/2016  . Fibromyalgia 08/18/2015  . Rheumatoid arthritis ([place]) 03/09/2015  . Essential hypertension, benign 02/18/2015  . Cervical disc disorder with radiculopathy of cervical region 01/21/2015  . Bilateral arm pain 01/13/2015  . After cataract 07/22/2014  . Personal history of colonic polyp - adenoma 03/01/2014  . [place] intertrigo 12/29/2013  . Dyshidrotic eczema 12/29/2013  . Routine adult health maintenance 12/29/2013  . 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 Frequent falls 02/10/2013  . Generalized pain 08/30/2012  . Neck pain 02/21/2012  . Seizures ([place]) 07/29/2008  . Migraine without aura 07/29/2007  . DEPENDENCE, BARB/SED, CONTINUOUS 06/24/2007  . HYPERLIPIDEMIA 12/19/2006  . OBESITY, NOS 12/19/2006  . Depression with anxiety 12/19/2006  . PANIC ATTACKS 12/19/2006  . 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 BORDERLINE PERSONALITY 12/19/2006  . COPD 12/19/2006  . REFLUX ESOPHAGITIS 12/19/2006    Past Surgical History:  Procedure Laterality Date  . ANKLE SURGERY Left   .  APPENDECTOMY    . BACK SURGERY    . BUNIONECTOMY     2 toes  . CARDIAC CATHETERIZATION  2008   Normal. Dr Acie Fredrickson.  . CHOLECYSTECTOMY    . CYST EXCISION     leg  . ESOPHAGOGASTRODUODENOSCOPY (EGD) WITH PROPOFOL N/A 06/27/2018   Procedure: ESOPHAGOGASTRODUODENOSCOPY (EGD) WITH PROPOFOL;  Surgeon: Jonathon Bellows, MD;  Location: [facility] ENDOSCOPY;  Service: Gastroenterology;  Laterality: N/A;  . FLANK MASS EXCISION    . FOOT TENDON SURGERY  june 2014   both  feet  . KNEE SURGERY     Bilateral.  . NOSE SURGERY    . SHOULDER SURGERY     bil.  Marland Kitchen TOTAL ABDOMINAL HYSTERECTOMY      Prior to Admission medications   Medication Sig Start Date End Date Taking? Authorizing Provider  albuterol (PROVENTIL HFA;VENTOLIN HFA) 108 (90 Base) MCG/ACT inhaler INHALE 2 PUFFS INTO THE LUNGS EVERY 6 HOURS AS NEEDED FOR WHEEZING ORSHORTNESS OF BREATH. Patient taking differently: Inhale 2 puffs into the lungs every 6 (six) hours as needed for wheezing or shortness of breath. 01/09/19   Lovenia Kim, MD  alendronate (FOSAMAX) 70 MG tablet TAKE 1 TABLET BY MOUTH ONCE WEEKLY 12/27/20   Patriciaann Clan, DO  aspirin EC 81 MG EC tablet Take 1 tablet (81 mg total) by mouth daily. 09/12/19   Geradine Girt, DO  atorvastatin (LIPITOR) 80 MG tablet TAKE 1 TABLET BY MOUTH ONCE DAILY AT 6PM 09/12/20   Suzzanne Cloud, NP  azithromycin (ZITHROMAX Z-PAK) 250 MG tablet Take 500mg  (2 tablets) on day 1, then 250mg  (1 tablet) on day 2-5 11/09/20   Mullis, Kiersten P, DO  benzonatate (TESSALON) 100 MG capsule Take 1 capsule (100 mg total) by mouth 2 (two) times daily as needed for cough. 11/09/20   Mullis, Kiersten P, DO  carvedilol (COREG) 3.125 MG tablet Take 1 tablet (3.125 mg total) by mouth 2 (two) times daily with a meal. 06/29/20   Beard, Ralph Leyden, DO  citalopram (CELEXA) 20 MG tablet TAKE 1 TABLET BY MOUTH ONCE DAILY 11/11/20   Patriciaann Clan, DO  clotrimazole (LOTRIMIN) 1 % cream Apply 1 application topically 2 (two) times daily. Under breasts 08/03/20   Patriciaann Clan, DO  Erenumab-aooe (AIMOVIG) 140 MG/ML SOAJ Inject 140 mg into the skin every 30 (thirty) days. 12/08/20   Kathrynn Ducking, MD  folic acid (FOLVITE) 1 MG tablet TAKE 1 TABLET BY MOUTH ONCE A DAY 12/27/20   Darrelyn Hillock N, DO  gabapentin (NEURONTIN) 300 MG capsule TAKE 3 CAPSULES BY MOUTH TWICE DAILY 10/26/20   Patriciaann Clan, DO  HYDROcodone-acetaminophen (NORCO) 10-325 MG tablet Take 1-2 tablets by mouth See  admin instructions. 1 tablet in the morning and 2 tablets at night 08/28/19   [provider]  omeprazole (PRILOSEC) 20 MG capsule TAKE 1 CAPSULE BY MOUTH ONCE DAILY 11/30/20   Patriciaann Clan, DO  PHENObarbital (LUMINAL) 100 MG tablet TAKE 1 TABLET BY MOUTH ONCE DAILY 10/27/20   Suzzanne Cloud, NP  tiotropium (SPIRIVA HANDIHALER) 18 MCG inhalation capsule INHALE THE CONTENTS OF 1 CAPSULE VIA HANDIHALER BY MOUTH ONCE DAILY 08/03/20   Patriciaann Clan, DO  topiramate (TOPAMAX) 50 MG tablet Take 2 tablets twice daily 07/28/20   Suzzanne Cloud, NP  triamcinolone ointment (KENALOG) 0.1 % Apply 1 application topically 2 (two) times daily. Apply to front of your lower leg 08/03/20   Patriciaann Clan, DO  Ubrogepant (UBRELVY)  50 MG TABS Take 50 mg by mouth 2 (two) times daily as needed. 12/08/20   Kathrynn Ducking, MD    Allergies Amoxicillin, Aspirin, Codeine phosphate, Flexeril [cyclobenzaprine], Ibuprofen, Penicillins, and Tetanus toxoids  Family History  Problem Relation Age of Onset  . Cirrhosis Mother   . Diabetes Mother   . Cirrhosis Father   . Breast cancer Sister 4  . Breast cancer Sister 78  . Colon cancer Neg Hx     Social History Social History   Tobacco Use  . Smoking status: Former Research scientist (life sciences)  . Smokeless tobacco: Never Used  Vaping Use  . Vaping Use: Never used  Substance Use Topics  . Alcohol use: No  . Drug use: No    Review of Systems Constitutional: Positive for fever. Eyes: No visual changes. ENT: No sore throat. Cardiovascular: Positive chest pain. Respiratory: Positive shortness of breath. Gastrointestinal: Positive for  abdominal pain, nausea, vomiting and diarrhea.   Genitourinary: Negative for dysuria. Musculoskeletal: Positive for back pain. Skin: Positive for rash. Neurological: Positive for left sided weakness since stroke.   ____________________________________________   PHYSICAL EXAM:  VITAL SIGNS: ED Triage Vitals [12/27/20 1700]  Enc  Vitals Group     BP (!) 138/93     Pulse Rate 66     Resp 20     Temp 98.2 F (36.8 C)     Temp Source Oral     SpO2 96 %     Weight 213 lb (96.6 kg)     Height 5\' 5"  (1.651 m)     Head Circumference      Peak Flow      Pain Score 9   Constitutional: Alert and oriented.  Eyes: Conjunctivae are normal.  ENT      Head: Normocephalic and atraumatic.      Nose: No congestion/rhinnorhea.      Mouth/Throat: Mucous membranes are moist.      Neck: No stridor. Hematological/Lymphatic/Immunilogical: No cervical lymphadenopathy. Cardiovascular: Normal rate, regular rhythm.  No murmurs, rubs, or gallops.  Respiratory: Normal respiratory effort without tachypnea nor retractions. Breath sounds are clear and equal bilaterally. No wheezes/rales/rhonchi. Gastrointestinal: Soft and non tender. No rebound. No guarding.  Genitourinary: Deferred Musculoskeletal: Normal range of motion in all extremities. Mild bilateral non pitting edema.  Neurologic:  Normal speech and language. No gross focal neurologic deficits are appreciated.  Skin:  Skin is warm, dry and intact. No rash noted. Psychiatric: Mood and affect are normal. Speech and behavior are normal. Patient exhibits appropriate insight and judgment.  ____________________________________________    LABS (pertinent positives/negatives)  CBC wbc 7.1, hgb 10.5, plt 195 BMP na 138, k 4.3, glu 102, cr 0.69  ____________________________________________   EKG  None  ____________________________________________    RADIOLOGY  US venous lower extremities No DVT  ____________________________________________   PROCEDURES  Procedures  ____________________________________________   INITIAL IMPRESSION / ASSESSMENT AND PLAN / ED COURSE  Pertinent labs & imaging results that were available during my care of the patient were reviewed by me and considered in my medical decision making (see chart for details).   Patient presented to the  emergency department today because of concerns for leg pain and possible DVT.  On exam patient without any erythema to the legs.  They are somewhat diffusely tender.  Pulses are good distally.  Ultrasounds were performed which not show any acute DVT.  I discussed this with patient.  This time I do think is reasonable for patient to be discharged  home.    ____________________________________________   FINAL CLINICAL IMPRESSION(S) / ED DIAGNOSES  Final diagnoses:  Bilateral leg pain     Note: This dictation was prepared with Dragon dictation. Any transcriptional errors that result from this process are unintentional     Nance Pear, MD 12/27/20 1930

## 2020-12-27 NOTE — Discharge Instructions (Addendum)
Please seek medical attention for any high fevers, chest pain, shortness of breath, change in behavior, persistent vomiting, bloody stool or any other new or concerning symptoms.  

## 2021-01-02 ENCOUNTER — Encounter: Payer: Self-pay | Admitting: Family Medicine

## 2021-01-02 ENCOUNTER — Other Ambulatory Visit: Payer: Self-pay

## 2021-01-02 ENCOUNTER — Ambulatory Visit (INDEPENDENT_AMBULATORY_CARE_PROVIDER_SITE_OTHER): Payer: Medicare Other | Admitting: Family Medicine

## 2021-01-02 VITALS — BP 98/64 | HR 67 | Ht 65.0 in | Wt 215.0 lb

## 2021-01-02 DIAGNOSIS — M069 Rheumatoid arthritis, unspecified: Secondary | ICD-10-CM | POA: Diagnosis not present

## 2021-01-02 DIAGNOSIS — M79605 Pain in left leg: Secondary | ICD-10-CM

## 2021-01-02 DIAGNOSIS — G609 Hereditary and idiopathic neuropathy, unspecified: Secondary | ICD-10-CM

## 2021-01-02 DIAGNOSIS — G6289 Other specified polyneuropathies: Secondary | ICD-10-CM

## 2021-01-02 DIAGNOSIS — M79604 Pain in right leg: Secondary | ICD-10-CM

## 2021-01-02 DIAGNOSIS — R7303 Prediabetes: Secondary | ICD-10-CM

## 2021-01-02 LAB — POCT GLYCOSYLATED HEMOGLOBIN (HGB A1C): HbA1c, POC (controlled diabetic range): 5.2 % (ref 0.0–7.0)

## 2021-01-02 NOTE — Progress Notes (Signed)
SUBJECTIVE:   CHIEF COMPLAINT / HPI: "check legs"   [name] is a 70 year old female presenting with her daughter for an ED follow-up and discuss the following:  Bilateral leg pain: Evaluated in the ED on 12/27/2020 due to bilateral lower leg pain, erythema, and swelling.  Good distal pulses on exam.  DVT U/S was performed showing no evidence of DVT bilaterally.  BNP 98, BMP and CBC with anemia at baseline.  No significant electrolyte derangements or AKI.  Discharged home with observation.  Today she reports continued pain. She believes its been a couple months or so, worse in the past two months.  Insidious onset.  Pain is "like someone is stabbing me with a knife." Constant. In both feet up to knee level, feels like it is coming from the sides of her ankles and radiating upward.  She feels like her soft tissue swelling of her lower legs is at her baseline and not increased.  She does state intermittently her legs will be red but has not seen a rash in the past few days.  She is already taking gabapentin 900 mg 3 times daily, Celexa 20 mg, and Topamax.  It hurts to walk because the bottom of her feet also feel numb, she reports this chronically.  Using a cane or rolling walker to get around.  She has a history of rheumatoid arthritis and previously required injections for control.  She has not seen her rheumatologist since the beginning of the pandemic due to with difficulty getting in during Covid.  She has been off all controller therapy.  Denies any associated weakness, shortness of breath, chest pain, abdominal pain, fever, fatigue with this.  She has had previous surgery in her right ankle and left foot.  PERTINENT  PMH / PSH: Previous CVA, hypertension, migraine without aura, generalized convulsive epilepsy, COPD, cervical radiculopathy, rheumatoid arthritis, PTSD, fibromyalgia, depression/anxiety  OBJECTIVE:   BP 98/64   Pulse 67   Ht 5\' 5"  (1.651 m)   Wt 215 lb (97.5 kg)   SpO2  96%   BMI 35.78 kg/m   General: Alert, NAD HEENT: NCAT, MMM, oropharynx nonerythematous  Cardiac: RRR Lungs: Clear bilaterally, no increased WOB, no wheezing or crackles heard  Abdomen: soft, non-tender Msk: Moves all extremities spontaneously  Ext/Derm: Warm, dry bilaterally, palpable dorsalis pulses bilaterally.  Soft tissue nonpitting edema present to approximately mid shin bilaterally.  No rash seen throughout bilateral lower extremity, xerosis with flaking skin present on dorsum of bilateral feet.  Limited ROM of bilateral ankle joints, (Chronic L>R, previous surgery).  Tenderness to light palpation of bilateral feet, ankles, and lower legs.   Diabetic Foot Exam - Simple   Simple Foot Form Diabetic Foot exam was performed with the following findings: Yes 01/02/2021  2:49 PM  Visual Inspection No deformities, no ulcerations, no other skin breakdown bilaterally: Yes Sensation Testing See comments: Yes Pulse Check Posterior Tibialis and Dorsalis pulse intact bilaterally: Yes Comments Patient unable to feel monofilament testing on entirety of feet soles bilaterally, can feel on dorsum of foot and ankles.     ASSESSMENT/PLAN:   Bilateral leg pain Suspect likely MSK, considering RA exacerbation in bilateral ankles vs aggravated OA especially as she has had previous surgeries in these areas.  Reassuringly no evidence of DVT on U/S 3/8.  No current rash, erythema, or warmth to touch to suggest bilateral cellulitis.  Her dependent swelling in setting of venous stasis appears at baseline per her report, low concern for  concurrent CHF (BNP 98 on 3/8) and no new electrolyte or anemia derangements to contribute.  Fibromyalgia likely complicates her presentation.  Given patient concern, will proceed with bilateral ankle x-rays.  Elevate and ice frequently, Voltaren gel and Tylenol as needed.  Instructed to call her rheumatologist to schedule follow-up appointment asap to restart DMARDs, limited  pain and control options otherwise given her comorbid conditions. May need trial of steroids pending XRs and availability to rheum.   Peripheral neuropathy Likely in the setting of longstanding rheumatoid arthritis.  However, will check A1c, TSH, B12, CMP, RPR.    Follow-up after results return or sooner if needed.  Patriciaann Clan, [place]

## 2021-01-02 NOTE — Patient Instructions (Signed)
Lotion: Cetaphil or cerve in the tube rather than the pump.   Call your rheumatologist to see them soon.  Keep wearing socks to help your blood flow go back up.  GO to [facility] for your xrays  Labs should be back in the 1-2 days  Use voltaren gel 4 times daily to help with pain, in addition to tylenol 1000mg  3-4 times daily. Can place ice or heat 20 min at a time several times daily  Off load as you can and elevate.   Follow up after results.

## 2021-01-03 ENCOUNTER — Encounter: Payer: Self-pay | Admitting: Family Medicine

## 2021-01-03 ENCOUNTER — Ambulatory Visit
Admission: RE | Admit: 2021-01-03 | Discharge: 2021-01-03 | Disposition: A | Discharge status: Home / Self Care | Payer: Medicare Other | Source: Ambulatory Visit | Attending: Family Medicine | Admitting: Family Medicine

## 2021-01-03 DIAGNOSIS — M79605 Pain in left leg: Secondary | ICD-10-CM | POA: Insufficient documentation

## 2021-01-03 DIAGNOSIS — G629 Polyneuropathy, unspecified: Secondary | ICD-10-CM | POA: Insufficient documentation

## 2021-01-03 DIAGNOSIS — M79604 Pain in right leg: Secondary | ICD-10-CM | POA: Insufficient documentation

## 2021-01-03 DIAGNOSIS — G609 Hereditary and idiopathic neuropathy, unspecified: Secondary | ICD-10-CM

## 2021-01-03 LAB — TSH: TSH: 2.23 u[IU]/mL (ref 0.450–4.500)

## 2021-01-03 LAB — COMPREHENSIVE METABOLIC PANEL
ALT: 14 IU/L (ref 0–32)
AST: 18 IU/L (ref 0–40)
Albumin/Globulin Ratio: 1.3 (ref 1.2–2.2)
Albumin: 4 g/dL (ref 3.7–4.7)
Alkaline Phosphatase: 132 IU/L — ABNORMAL HIGH (ref 44–121)
BUN/Creatinine Ratio: 16 (ref 12–28)
BUN: 11 mg/dL (ref 8–27)
Bilirubin Total: <0.2 mg/dL (ref 0.0–1.2)
CO2: 20 mmol/L (ref 20–29)
Calcium: 8.6 mg/dL — ABNORMAL LOW (ref 8.7–10.3)
Chloride: 108 mmol/L — ABNORMAL HIGH (ref 96–106)
Creatinine, Ser: 0.69 mg/dL (ref 0.57–1.00)
Globulin, Total: 3 g/dL (ref 1.5–4.5)
Glucose: 106 mg/dL — ABNORMAL HIGH (ref 65–99)
Potassium: 4.9 mmol/L (ref 3.5–5.2)
Sodium: 142 mmol/L (ref 134–144)
Total Protein: 7 g/dL (ref 6.0–8.5)
eGFR: 93 mL/min/{1.73_m2} (ref >59)

## 2021-01-03 LAB — RPR: RPR Ser Ql: NONREACTIVE

## 2021-01-03 LAB — VITAMIN B12: Vitamin B-12: 483 pg/mL (ref 232–1245)

## 2021-01-03 NOTE — Assessment & Plan Note (Addendum)
Suspect likely MSK, considering RA exacerbation in bilateral ankles vs aggravated OA especially as she has had previous surgeries in these areas.  Reassuringly no evidence of DVT on U/S 3/8.  No current rash, erythema, or warmth to touch to suggest bilateral cellulitis.  Her dependent swelling in setting of venous stasis appears at baseline per her report, low concern for concurrent CHF (BNP 98 on 3/8) and no new electrolyte or anemia derangements to contribute.  Fibromyalgia likely complicates her presentation.  Given patient concern, will proceed with bilateral ankle x-rays.  Elevate and ice frequently, Voltaren gel and Tylenol as needed.  Instructed to call her rheumatologist to schedule follow-up appointment asap to restart DMARDs, limited pain and control options otherwise given her comorbid conditions. May need trial of steroids pending XRs and availability to rheum.

## 2021-01-03 NOTE — Assessment & Plan Note (Signed)
Likely in the setting of longstanding rheumatoid arthritis.  However, will check A1c, TSH, B12, CMP, RPR.

## 2021-01-10 ENCOUNTER — Telehealth: Payer: Self-pay | Admitting: Family Medicine

## 2021-01-10 NOTE — Telephone Encounter (Signed)
Called patient to discuss lab and x ray results. Mild to mod arthritis in L ankle, mild in the R. She reports continued bilateral foot pain, most prominent on the bottom of her feet with numbness. Continues to sound neuropathic/nerve pain in nature (however also surely arthritis contributing), but already on high doses of gabapentin.   She has not called her rheumatologist yet for follow up, encouraged her to call today and inform us of date/time. Follow up with myself in two weeks or sooner if worsening/acute changes.   Patriciaann Clan, DO

## 2021-01-26 ENCOUNTER — Other Ambulatory Visit: Payer: Self-pay | Admitting: Family Medicine

## 2021-01-26 DIAGNOSIS — G8929 Other chronic pain: Secondary | ICD-10-CM

## 2021-02-09 ENCOUNTER — Other Ambulatory Visit: Payer: Self-pay | Admitting: Family Medicine

## 2021-02-09 DIAGNOSIS — F32A Depression, unspecified: Secondary | ICD-10-CM

## 2021-02-27 ENCOUNTER — Other Ambulatory Visit: Payer: Self-pay

## 2021-02-27 ENCOUNTER — Ambulatory Visit (HOSPITAL_COMMUNITY)
Admission: RE | Admit: 2021-02-27 | Discharge: 2021-02-27 | Disposition: A | Discharge status: Home / Self Care | Payer: Medicare Other | Source: Ambulatory Visit | Attending: Family Medicine | Admitting: Family Medicine

## 2021-02-27 ENCOUNTER — Ambulatory Visit (INDEPENDENT_AMBULATORY_CARE_PROVIDER_SITE_OTHER): Payer: Medicare Other | Admitting: Family Medicine

## 2021-02-27 VITALS — BP 129/76 | HR 72 | Wt 213.2 lb

## 2021-02-27 DIAGNOSIS — R946 Abnormal results of thyroid function studies: Secondary | ICD-10-CM

## 2021-02-27 DIAGNOSIS — R531 Weakness: Secondary | ICD-10-CM | POA: Diagnosis not present

## 2021-02-27 DIAGNOSIS — R296 Repeated falls: Secondary | ICD-10-CM

## 2021-02-27 DIAGNOSIS — R519 Headache, unspecified: Secondary | ICD-10-CM | POA: Diagnosis not present

## 2021-02-27 DIAGNOSIS — G44309 Post-traumatic headache, unspecified, not intractable: Secondary | ICD-10-CM | POA: Diagnosis not present

## 2021-02-27 DIAGNOSIS — R55 Syncope and collapse: Secondary | ICD-10-CM | POA: Diagnosis not present

## 2021-02-27 DIAGNOSIS — R0781 Pleurodynia: Secondary | ICD-10-CM | POA: Diagnosis not present

## 2021-02-27 NOTE — Progress Notes (Signed)
SUBJECTIVE:   CHIEF COMPLAINT / HPI: [name] is a 70 year old female presenting for evaluation of the following:   Fall: Fell two weeks ago. "Blacked out for a few seconds" while walking into her front door, woke up on the floor of the porch. She normally walks with cane or walker and was not using it. She was with her brother but he didn't see the fall cause he was opening the door. Denies any proceeding shortness of breath, chest pain, lightheadedness/dizziness, N/V, visual changes, weakness before. Woke up on her left side, believes she hit the side of left head as well. Since then she has been having pain in her left rib underneath her breast and headaches. Daily headaches, "feels like a normal headache" with her history of migraines. "Every now and then sharp pains" in the region she hit without laceration or bruising. Previous stroke on the left with residual weakness, feels like her left is more weak than usual after landing on this side. Denies any facial droop or change in speech. No bowel or bladder incontinence. No seizure like activity witnessed by brother.  Due to the pain, she is to mainly staying in her bed for the past 2 weeks and has had less of an appetite.  Still eating and drinking, but less than usual.  Taking Hydrocodone TID, from pain clinic, with some help.  Reports this happened several years ago and has scars remaining on her face after falling forward.  PERTINENT  PMH / PSH: Migraines, hypertension, CVA with residual left-sided weakness, CAD, COPD, generalized epilepsy, rheumatoid arthritis, PTSD  OBJECTIVE:   BP 129/76   Pulse 72   Wt 96.7 kg   SpO2 97%   BMI 35.48 kg/m   General: Alert, NAD HEENT: NCAT, MMM, tender to palpation of left forehead without any overlying laceration, swelling, or bruising. Cardiac: RRR Lungs: Clear bilaterally, no increased WOB  Abdomen: soft, non-tender Msk: No ecchymoses or deformity noted to left abdomen.  Exquisitely  tender to palpation around anterior/mid axillary ribs 7-9 underneath left breast.  No breast tenderness with palpation.  Nontender to palpation of left arm and left leg. Ext: Warm, dry, 2+ distal pulses, no edema  Neuro: Alert and oriented X3.  CN II-XII intact.  EOMI, PERRLA.  Able to move all extremities spontaneously and equally, however approximately 4/5 muscle strength on the left arm and left leg.  Otherwise 5/5 strength elsewhere.  Sensation to light touch intact throughout.  Gait normal with assistance of rolling walker.  Orthostatic VS for the past 24 hrs:  BP- Lying Pulse- Lying BP- Sitting Pulse- Sitting BP- Standing at 0 minutes Pulse- Standing at 0 minutes  02/27/21 1204 128/81 68 120/79 66 112/74 66   EKG: unchanged from previous tracings, normal sinus rhythm, bradycardia, T wave inversions in anterolateral leads.  ASSESSMENT/PLAN:   Syncope and collapse Occurred approximately 2 weeks ago, reportedly "blacked out" while walking upstairs and woke up on the floor. Previous syncopal episode several years ago without known etiology.  Unclear etiology of recent episode as well, may be neurally mediated/vasovagal. Orthostatics WNL today. She has a known history of epilepsy compliant with her phenobarbital, without any bowel/bladder incontinence or witnessed seizure-like activity suspect this is much less likely.  EKG is abnormal with diffuse T wave inversions, however unchanged from EKG in 2020 with normal Myoview in 2020, no CP or palpitations.  Will obtain TSH, CBC, CMP.  Order echocardiogram.  Will consider cardiology referral pending echo,  especially with known abnormal EKG.  Headache Daily after hitting her head with fall from syncopal event.  Neurologically intact, with exception of decreased strength on the left side which is chronic from previous CVA.  Although 2 weeks ago, given her complex history and recurrent headaches will obtain CT head.  Rib pain on left side Exquisite  tenderness to palpation of lower ribs on the left side without any ecchymoses or swelling noted on exam.  Will obtain XR ribs, patient have completed at her convenience.  Frequent falls With this recent episode, she did not have her cane or walker at that time which she usually uses at baseline.  Given her previous CVA and chronic left-sided weakness, do believe she would benefit from another round of PT to help with strengthening exercises for safety at home.  Referral placed.    Follow-up in 1 week or sooner if needed.  ED if this recurs.  Patriciaann Clan, [place]

## 2021-02-27 NOTE — Patient Instructions (Addendum)
It was wonderful to see you today.  If this happens again you need to go the emergency room right away.  We will be getting labs, though she return the next few days and I will give you a call.  Additionally we will be scheduling an echocardiogram to look at how your heart is functioning and a CT of your head to make sure that you have had no changes after your fall with your continued headaches.  I have placed an order to have your ribs x-rayed, you can go to [facility] imaging at your convenience to have these completed.  Please make sure that you are drinking sips of fluids throughout the day to stay hydrated.  You can continue to use your hydrocodone in addition to Tylenol.  Maximum Tylenol 4000 mg a day, maximum 1000 mg at 1 time 3-4 times daily.  Please try to use a heating pad 20 minutes at a time several times a day on your areas of soreness.  You can also use Voltaren gel.

## 2021-02-28 ENCOUNTER — Other Ambulatory Visit: Payer: Self-pay | Admitting: Family Medicine

## 2021-02-28 ENCOUNTER — Ambulatory Visit
Admission: RE | Admit: 2021-02-28 | Discharge: 2021-02-28 | Disposition: A | Discharge status: Home / Self Care | Payer: Medicare Other | Source: Ambulatory Visit | Attending: Family Medicine | Admitting: Family Medicine

## 2021-02-28 DIAGNOSIS — R0781 Pleurodynia: Secondary | ICD-10-CM

## 2021-02-28 DIAGNOSIS — R55 Syncope and collapse: Secondary | ICD-10-CM | POA: Insufficient documentation

## 2021-02-28 LAB — CBC WITH DIFFERENTIAL/PLATELET
Basophils Absolute: 0 10*3/uL (ref 0.0–0.2)
Basos: 1 %
EOS (ABSOLUTE): 0.3 10*3/uL (ref 0.0–0.4)
Eos: 5 %
Hematocrit: 35.2 % (ref 34.0–46.6)
Hemoglobin: 11 g/dL — ABNORMAL LOW (ref 11.1–15.9)
Immature Grans (Abs): 0 10*3/uL (ref 0.0–0.1)
Immature Granulocytes: 0 %
Lymphocytes Absolute: 2.3 10*3/uL (ref 0.7–3.1)
Lymphs: 36 %
MCH: 25.8 pg — ABNORMAL LOW (ref 26.6–33.0)
MCHC: 31.3 g/dL — ABNORMAL LOW (ref 31.5–35.7)
MCV: 82 fL (ref 79–97)
Monocytes Absolute: 0.6 10*3/uL (ref 0.1–0.9)
Monocytes: 9 %
Neutrophils Absolute: 3.2 10*3/uL (ref 1.4–7.0)
Neutrophils: 49 %
Platelets: 232 10*3/uL (ref 150–450)
RBC: 4.27 x10E6/uL (ref 3.77–5.28)
RDW: 14.2 % (ref 11.7–15.4)
WBC: 6.4 10*3/uL (ref 3.4–10.8)

## 2021-02-28 LAB — COMPREHENSIVE METABOLIC PANEL
ALT: 14 IU/L (ref 0–32)
AST: 17 IU/L (ref 0–40)
Albumin/Globulin Ratio: 1.3 (ref 1.2–2.2)
Albumin: 4.2 g/dL (ref 3.7–4.7)
Alkaline Phosphatase: 136 IU/L — ABNORMAL HIGH (ref 44–121)
BUN/Creatinine Ratio: 16 (ref 12–28)
BUN: 11 mg/dL (ref 8–27)
Bilirubin Total: <0.2 mg/dL (ref 0.0–1.2)
CO2: 21 mmol/L (ref 20–29)
Calcium: 8.6 mg/dL — ABNORMAL LOW (ref 8.7–10.3)
Chloride: 106 mmol/L (ref 96–106)
Creatinine, Ser: 0.69 mg/dL (ref 0.57–1.00)
Globulin, Total: 3.3 g/dL (ref 1.5–4.5)
Glucose: 82 mg/dL (ref 65–99)
Potassium: 4.4 mmol/L (ref 3.5–5.2)
Sodium: 141 mmol/L (ref 134–144)
Total Protein: 7.5 g/dL (ref 6.0–8.5)
eGFR: 93 mL/min/{1.73_m2} (ref >59)

## 2021-02-28 LAB — TSH: TSH: 2.89 u[IU]/mL (ref 0.450–4.500)

## 2021-02-28 NOTE — Assessment & Plan Note (Addendum)
Daily after hitting her head with fall from syncopal event.  Neurologically intact, with exception of decreased strength on the left side which is chronic from previous CVA.  Although 2 weeks ago, given her complex history and recurrent headaches will obtain CT head.

## 2021-02-28 NOTE — Assessment & Plan Note (Signed)
Occurred approximately 2 weeks ago, reportedly "blacked out" while walking upstairs and woke up on the floor. Previous syncopal episode several years ago without known etiology.  Unclear etiology of recent episode as well, may be neurally mediated/vasovagal. Orthostatics WNL today. She has a known history of epilepsy compliant with her phenobarbital, without any bowel/bladder incontinence or witnessed seizure-like activity suspect this is much less likely.  EKG is abnormal with diffuse T wave inversions, however unchanged from EKG in 2020 with normal Myoview in 2020, no CP or palpitations.  Will obtain TSH, CBC, CMP.  Order echocardiogram.  Will consider cardiology referral pending echo, especially with known abnormal EKG.

## 2021-02-28 NOTE — Assessment & Plan Note (Signed)
Exquisite tenderness to palpation of lower ribs on the left side without any ecchymoses or swelling noted on exam.  Will obtain XR ribs, patient have completed at her convenience.

## 2021-02-28 NOTE — Assessment & Plan Note (Signed)
With this recent episode, she did not have her cane or walker at that time which she usually uses at baseline.  Given her previous CVA and chronic left-sided weakness, do believe she would benefit from another round of PT to help with strengthening exercises for safety at home.  Referral placed.

## 2021-03-01 ENCOUNTER — Ambulatory Visit: Payer: Medicare Other | Admitting: Family Medicine

## 2021-03-06 ENCOUNTER — Other Ambulatory Visit: Payer: Self-pay | Admitting: Neurology

## 2021-03-06 ENCOUNTER — Other Ambulatory Visit: Payer: Self-pay | Admitting: Family Medicine

## 2021-03-06 DIAGNOSIS — I10 Essential (primary) hypertension: Secondary | ICD-10-CM

## 2021-03-07 ENCOUNTER — Other Ambulatory Visit: Payer: Self-pay

## 2021-03-07 ENCOUNTER — Ambulatory Visit (HOSPITAL_COMMUNITY)
Admission: RE | Admit: 2021-03-07 | Discharge: 2021-03-07 | Disposition: A | Discharge status: Home / Self Care | Payer: Medicare Other | Source: Ambulatory Visit | Attending: Family Medicine | Admitting: Family Medicine

## 2021-03-07 DIAGNOSIS — R519 Headache, unspecified: Secondary | ICD-10-CM | POA: Diagnosis present

## 2021-03-07 DIAGNOSIS — G44309 Post-traumatic headache, unspecified, not intractable: Secondary | ICD-10-CM | POA: Diagnosis present

## 2021-03-07 DIAGNOSIS — R55 Syncope and collapse: Secondary | ICD-10-CM | POA: Insufficient documentation

## 2021-03-14 ENCOUNTER — Other Ambulatory Visit: Payer: Self-pay | Admitting: Neurology

## 2021-03-15 ENCOUNTER — Encounter: Payer: Self-pay | Admitting: Family Medicine

## 2021-03-27 ENCOUNTER — Encounter: Payer: Self-pay | Admitting: Family Medicine

## 2021-03-27 ENCOUNTER — Other Ambulatory Visit: Payer: Self-pay

## 2021-03-27 ENCOUNTER — Ambulatory Visit (INDEPENDENT_AMBULATORY_CARE_PROVIDER_SITE_OTHER): Payer: Medicare Other | Admitting: Family Medicine

## 2021-03-27 VITALS — BP 120/72 | HR 71 | Wt 212.0 lb

## 2021-03-27 DIAGNOSIS — N644 Mastodynia: Secondary | ICD-10-CM | POA: Diagnosis not present

## 2021-03-27 DIAGNOSIS — M791 Myalgia, unspecified site: Secondary | ICD-10-CM | POA: Diagnosis not present

## 2021-03-27 DIAGNOSIS — M79605 Pain in left leg: Secondary | ICD-10-CM | POA: Diagnosis not present

## 2021-03-27 DIAGNOSIS — R0781 Pleurodynia: Secondary | ICD-10-CM

## 2021-03-27 DIAGNOSIS — M79604 Pain in right leg: Secondary | ICD-10-CM

## 2021-03-27 NOTE — Patient Instructions (Addendum)
It was wonderful to see you again today.  I am so sorry to hear that your leg pain and weakness has been getting worse.  We will go ahead and get x-rays of your back and your legs.  Additionally we will grab some labs today.  Hopefully we hear from physical therapy soon as I feel they would be a great resource to help with your strength.  Please follow-up in the next few weeks so that we can go over x-rays and next steps.

## 2021-03-27 NOTE — Progress Notes (Signed)
SUBJECTIVE:   CHIEF COMPLAINT / HPI: "Check in"   [name] is a 70 year old female presenting to check-in after recent fall/syncope.  She most recently was seen on 5/9 after a syncopal event and subsequently falling on her left side.  She sustained left-sided fifth/sixth rib fractures.  CT head without acute intracranial abnormality.  Echo scheduled for 6/16.  She reports that she is feeling much better in terms of her rib pain since the fall, however she still has pain in her bilateral legs and anterior portion of her left breast.  She is very stressed about her breast pain, states "it does not feel right, it feels different."  States her sister passed away in her 78s due to breast cancer.  She has not felt any lumps or bumps.  Additionally she states she almost fell again due to her legs since I have last seen her.  No further episodes of pre-/syncope.  Reports a chronic several year history of bilateral upper leg pain and weakness, however feels like both have been getting worse since the recent fall in 02/2021.  She also endorses intermittent anterior thigh numbness bilaterally for at least the past 2 weeks that will come for about 20 minutes or so.  Feels like her low back pain is worse than previous.  No posterior numbness or tingling. Denies any new bowel or bladder incontinence or saddle anesthesia.  She request an MRI of her bilateral legs to be done today.  She follows with a pain specialist however she has not seen them for this, does not have an orthopedic provider.  PERTINENT  PMH / PSH: Migraines, hypertension, CVA with residual left-sided weakness, COPD, rheumatoid arthritis, seizure disorder on phenobarbital, depression/anxiety, previous history of conversion disorder/borderline personality disorder, fibromyalgia, PTSD, syncope  OBJECTIVE:   BP 120/72   Pulse 71   Wt 212 lb (96.2 kg)   SpO2 98%   BMI 35.28 kg/m   General: Alert, NAD HEENT: NCAT, MMM Breasts: right breast  normal without mass, skin or nipple changes or axillary nodes, left breast tender to palpation diffusely of anterior/superior region with a small (<1cm) nodule around 3 o'clock position, skin or nipple changes or axillary nodes. Lungs: No increased WOB  Msk: No ecchymoses or deformity noted to upper and bilateral lower extremity.  Diffuse tender points along bilateral upper/lower extremities.  Tender along bilateral paralumbar musculature.  Sensation to light touch intact throughout bilateral lower extremity.  4/5 strength in bilateral lower extremity, however does appear to be effort dependent on the right.  Gait at baseline with assistance of rolling walker. Ext: Warm, dry  ASSESSMENT/PLAN:   Bilateral leg pain Longstanding history of bilateral leg pain/weakness, however worsening in the past 2 months after recent fall (multifactorial with history of left-sided weakness from previous CVA, RA/OA, and fibromyalgia).  Patient requesting MRI of her legs today, however through shared decision making we will start with updated XR of her bilateral femurs and thoracic/lumbar spine since she has not had imaging in quite some time.  Obtain CK to assess for myopathy.  Continue pain regimen, ice/heat, and use of rolling walker.  Awaiting PT referral.  Breast pain Left-sided, after recent fall on 02/2021.  While overall suspect it is from the trauma of the fall, patient is extremely concerned about breast cancer given her previous family history.  Do feel a small nodule on her lateral breast, however this may be fibrocystic change.  Placed order for mammogram.  Rib pain on left side  Known fifth/sixth rib fractures in 02/2021, significantly improved.    Discussed with Dr. Erin Hearing.  Follow-up in the next several weeks or sooner if worsening.  ED precautions discussed.  Patriciaann Clan, [place]

## 2021-03-28 ENCOUNTER — Encounter: Payer: Self-pay | Admitting: Family Medicine

## 2021-03-28 DIAGNOSIS — N644 Mastodynia: Secondary | ICD-10-CM | POA: Insufficient documentation

## 2021-03-28 LAB — CK: Total CK: 66 U/L (ref 32–182)

## 2021-03-28 NOTE — Assessment & Plan Note (Signed)
Known fifth/sixth rib fractures in 02/2021, significantly improved.

## 2021-03-28 NOTE — Assessment & Plan Note (Signed)
Longstanding history of bilateral leg pain/weakness, however worsening in the past 2 months after recent fall (multifactorial with history of left-sided weakness from previous CVA, RA/OA, and fibromyalgia).  Patient requesting MRI of her legs today, however through shared decision making we will start with updated XR of her bilateral femurs and thoracic/lumbar spine since she has not had imaging in quite some time.  Obtain CK to assess for myopathy.  Continue pain regimen, ice/heat, and use of rolling walker.  Awaiting PT referral.

## 2021-03-28 NOTE — Assessment & Plan Note (Signed)
Left-sided, after recent fall on 02/2021.  While overall suspect it is from the trauma of the fall, patient is extremely concerned about breast cancer given her previous family history.  Do feel a small nodule on her lateral breast, however this may be fibrocystic change.  Placed order for mammogram.

## 2021-03-29 ENCOUNTER — Other Ambulatory Visit: Payer: Self-pay

## 2021-03-29 ENCOUNTER — Ambulatory Visit
Admission: RE | Admit: 2021-03-29 | Discharge: 2021-03-29 | Disposition: A | Discharge status: Home / Self Care | Payer: Medicare Other | Source: Ambulatory Visit | Attending: Family Medicine | Admitting: Family Medicine

## 2021-03-29 DIAGNOSIS — M791 Myalgia, unspecified site: Secondary | ICD-10-CM

## 2021-04-06 ENCOUNTER — Ambulatory Visit (HOSPITAL_COMMUNITY)
Admission: RE | Admit: 2021-04-06 | Discharge: 2021-04-06 | Disposition: A | Discharge status: Home / Self Care | Payer: Medicare Other | Source: Ambulatory Visit | Attending: Family Medicine | Admitting: Family Medicine

## 2021-04-06 ENCOUNTER — Encounter: Payer: Self-pay | Admitting: Family Medicine

## 2021-04-06 ENCOUNTER — Other Ambulatory Visit: Payer: Self-pay

## 2021-04-06 DIAGNOSIS — J449 Chronic obstructive pulmonary disease, unspecified: Secondary | ICD-10-CM | POA: Diagnosis not present

## 2021-04-06 DIAGNOSIS — I10 Essential (primary) hypertension: Secondary | ICD-10-CM | POA: Diagnosis not present

## 2021-04-06 DIAGNOSIS — I340 Nonrheumatic mitral (valve) insufficiency: Secondary | ICD-10-CM | POA: Insufficient documentation

## 2021-04-06 DIAGNOSIS — E785 Hyperlipidemia, unspecified: Secondary | ICD-10-CM | POA: Insufficient documentation

## 2021-04-06 DIAGNOSIS — R55 Syncope and collapse: Secondary | ICD-10-CM | POA: Diagnosis present

## 2021-04-06 NOTE — Progress Notes (Signed)
  Echocardiogram 2D Echocardiogram has been performed.  [name] 04/06/2021, 1:47 PM

## 2021-04-07 LAB — ECHOCARDIOGRAM COMPLETE
Area-P 1/2: 4.8 cm2
Radius: 0.3 cm
S' Lateral: 3.5 cm

## 2021-04-10 ENCOUNTER — Encounter: Payer: Self-pay | Admitting: Family Medicine

## 2021-04-11 ENCOUNTER — Other Ambulatory Visit: Payer: Self-pay | Admitting: Family Medicine

## 2021-04-28 ENCOUNTER — Other Ambulatory Visit: Payer: Self-pay | Admitting: Neurology

## 2021-05-09 ENCOUNTER — Other Ambulatory Visit: Payer: Self-pay | Admitting: Family Medicine

## 2021-05-09 DIAGNOSIS — F32A Depression, unspecified: Secondary | ICD-10-CM

## 2021-05-11 ENCOUNTER — Other Ambulatory Visit: Payer: Self-pay | Admitting: Family Medicine

## 2021-06-09 ENCOUNTER — Other Ambulatory Visit: Payer: Self-pay

## 2021-06-09 ENCOUNTER — Encounter: Payer: Self-pay | Admitting: Family Medicine

## 2021-06-09 ENCOUNTER — Ambulatory Visit (INDEPENDENT_AMBULATORY_CARE_PROVIDER_SITE_OTHER): Payer: Medicare Other | Admitting: Family Medicine

## 2021-06-09 VITALS — BP 130/78 | HR 66 | Wt 210.0 lb

## 2021-06-09 DIAGNOSIS — M79671 Pain in right foot: Secondary | ICD-10-CM | POA: Diagnosis present

## 2021-06-09 NOTE — Patient Instructions (Signed)
It was great seeing you today!  Today you came in for heel pain and I put in an order for you to get an XR of your right foot. You can alternate between ice and heat and continue your pain medication. We will also check labs to see if you are having an arthritis flare. I will call you after all results are in.   Feel free to call with any questions or concerns at any time, at [phone].   Take care,  Dr. Shary Key [facility]

## 2021-06-09 NOTE — Progress Notes (Signed)
    SUBJECTIVE:   CHIEF COMPLAINT / HPI:   [name] is a 70 yo with history of left-sided weakness from previous CVA, RA/OA, and fibromyalgia who presents with foot pain   Patient was seen by Dr. Higinio Landry on 6/6 for this chronic bilateral leg pain which has worsened over the past several months after a recent fall.  XR of her bilateral femurs and thoracic/lumbar spine were performed without acute abnormalities.   CT spine showed no acute abnormalities, and mild degenerative change and minimal height loss at T4 which was stable from prior exam. Conservative management of pain regimen, ice/heat, and use of rolling walker. PT referral was placed  Today patient endorses R heel pain for 2 weeks states its the worse pain she has ever felt in her feet. States it feels liek someone is pulling something out of her heels. States it hurts even just sitting there. Thought about heel spurs. States it hurts most at night. Takes Norco 1 tablet in morning and 1 tablet at night.   Also endorses L hand numbness for the past 2 weeks. States she doesn't have feeling. Denies injury. Not in arm. Denies traveling up arm. Runs water over it at home which hasnt helped. States she cant hold anything    OBJECTIVE:   BP 130/78   Pulse 66   Wt 210 lb (95.3 kg)   SpO2 99%   BMI 34.95 kg/m    General: alert, NAD CV: RRR no murmurs Resp: CTAB normal WOB GI: soft, non distended  MSK: R foot exquisitely tender to light palpation of heel, ankle, and dorsum of foot. Unable to assess ROM or strength due to pain  ASSESSMENT/Landry:   No problem-specific Assessment & Landry notes found for this encounter.   R foot pain Patient presents with R foot pain for the past 2 weeks without known inciting injury or trauma. On exam R foot exquisitely tender to light palpation of heel, ankle, and dorsum of foot. Unable to assess ROM or strength due to pain. Potentially due to fracture given acute pain and tenderness. Placed order for R  foot and ankle XR. Also considering arthritis flare so will check CRP, ESR. Will check uric acid for possibly gout contributing. Advised to continue pain medication regimen, and ice and rest foot.   Patient to follow up in the next couple of weeks to check on pain, hand numbness, and address some health maintenance items.   [place]

## 2021-06-10 LAB — URIC ACID: Uric Acid: 4.3 mg/dL (ref 3.1–7.9)

## 2021-06-10 LAB — SEDIMENTATION RATE: Sed Rate: 44 mm/hr — ABNORMAL HIGH (ref 0–40)

## 2021-06-10 LAB — C-REACTIVE PROTEIN: CRP: 3 mg/L (ref 0–10)

## 2021-06-12 ENCOUNTER — Ambulatory Visit
Admission: RE | Admit: 2021-06-12 | Discharge: 2021-06-12 | Disposition: A | Discharge status: Home / Self Care | Payer: Medicare Other | Source: Ambulatory Visit | Attending: Family Medicine | Admitting: Family Medicine

## 2021-06-12 DIAGNOSIS — M79671 Pain in right foot: Secondary | ICD-10-CM

## 2021-06-13 ENCOUNTER — Other Ambulatory Visit: Payer: Self-pay | Admitting: Family Medicine

## 2021-06-13 DIAGNOSIS — G8929 Other chronic pain: Secondary | ICD-10-CM

## 2021-06-19 ENCOUNTER — Telehealth: Payer: Self-pay | Admitting: Family Medicine

## 2021-06-19 NOTE — Telephone Encounter (Signed)
Patient is calling and would like for Dr. Arby Barrette to call her to discuss results from last Xray and blood work she had taken.   The best call back number is [phone].

## 2021-06-20 ENCOUNTER — Encounter: Payer: Self-pay | Admitting: Family Medicine

## 2021-06-22 ENCOUNTER — Ambulatory Visit (INDEPENDENT_AMBULATORY_CARE_PROVIDER_SITE_OTHER): Payer: Medicare Other | Admitting: Neurology

## 2021-06-22 ENCOUNTER — Encounter: Payer: Self-pay | Admitting: Neurology

## 2021-06-22 ENCOUNTER — Other Ambulatory Visit: Payer: Self-pay

## 2021-06-22 VITALS — BP 122/82 | HR 65 | Ht 65.0 in | Wt 208.0 lb

## 2021-06-22 DIAGNOSIS — Z5181 Encounter for therapeutic drug level monitoring: Secondary | ICD-10-CM | POA: Diagnosis not present

## 2021-06-22 DIAGNOSIS — R202 Paresthesia of skin: Secondary | ICD-10-CM | POA: Diagnosis not present

## 2021-06-22 DIAGNOSIS — G40309 Generalized idiopathic epilepsy and epileptic syndromes, not intractable, without status epilepticus: Secondary | ICD-10-CM | POA: Diagnosis not present

## 2021-06-22 DIAGNOSIS — G43009 Migraine without aura, not intractable, without status migrainosus: Secondary | ICD-10-CM

## 2021-06-22 MED ORDER — AIMOVIG 140 MG/ML ~~LOC~~ SOAJ: 140.0000 mg | SUBCUTANEOUS | 4 refills | Status: DC

## 2021-06-22 MED ORDER — PREDNISONE 10 MG PO TABS: ORAL_TABLET | ORAL | 0 refills | Status: DC

## 2021-06-22 NOTE — Progress Notes (Signed)
Reason for visit: History of seizures, cerebrovascular disease, headache  [name] is an 70 y.o. female  History of present illness:  [name] is a 70 year old right-handed black female with a history of rheumatoid arthritis, cerebrovascular disease, headache, and a history of seizures.  She has been well controlled with her seizures on phenobarbital.  She has had ongoing daily headaches with sharp shooting pains in the temporal regions.  She was placed on Aimovig but never started this.  Roselyn Meier was not approved through the insurance.  She claims that she has stopped the [facility] powders.  Within the last 3 weeks, she had spontaneous onset of right heel and ankle pain, worse with weightbearing.  X-rays have been unrevealing.  She reports that there is some slight swelling of the right foot and ankle, and the ankle feels warm.  She reports that she feels weak all over.  She also reports some tingling sensations in the morning involving the left hand.  She comes back here for further evaluation.  She does report chronic low back pain and some pain down the left leg, not the right leg.  Past Medical History:  Diagnosis Date   After cataract 07/22/2014   Asthma    Bilateral arm pain 01/13/2015   Chronic low back pain    Collagen vascular disease ([place])    Conversion disorder with seizures or convulsions    On Phenobarbital.   COPD (chronic obstructive pulmonary disease) (HCC)    Depression    Ear pain, bilateral 03/30/2019   Fibromyalgia    08/18/15  [facility]   GERD (gastroesophageal reflux disease)    Headache 05/27/2013   History of CVA (cerebrovascular accident)    "11 minni srokes"   Hyperlipidemia    Hypertension    Migraine    Neck pain 02/21/2012   Personal history of colonic polyp - adenoma 03/01/2014   Personality disorder ([place])    Productive cough 11/09/2020   Rash and nonspecific skin eruption 08/04/2020   Rheumatoid arthritis ([place])    08/18/15  [facility]   Seizures ([place])    last  seizure 4 years ago   Sleep apnea    Mild. No need for CPAP.   Stroke [facility])     Past Surgical History:  Procedure Laterality Date   ANKLE SURGERY Left    APPENDECTOMY     BACK SURGERY     BUNIONECTOMY     2 toes   CARDIAC CATHETERIZATION  2008   Normal. Dr Acie Fredrickson.   CHOLECYSTECTOMY     CYST EXCISION     leg   ESOPHAGOGASTRODUODENOSCOPY (EGD) WITH PROPOFOL N/A 06/27/2018   Procedure: ESOPHAGOGASTRODUODENOSCOPY (EGD) WITH PROPOFOL;  Surgeon: Jonathon Bellows, MD;  Location: [facility] ENDOSCOPY;  Service: Gastroenterology;  Laterality: N/A;   FLANK MASS EXCISION     FOOT TENDON SURGERY  june 2014   both feet   KNEE SURGERY     Bilateral.   NOSE SURGERY     SHOULDER SURGERY     bil.   TOTAL ABDOMINAL HYSTERECTOMY      Family History  Problem Relation Age of Onset   Cirrhosis Mother    Diabetes Mother    Cirrhosis Father    Breast cancer Sister 42   Breast cancer Sister 20   Colon cancer Neg Hx     Social history:  reports that she has quit smoking. She has never used smokeless tobacco. She reports that she does not drink alcohol and does not use drugs.  Allergies  Allergen Reactions   Amoxicillin     REACTION: hives   Aspirin     REACTION: rash   Codeine Phosphate     REACTION: N/V   Flexeril [Cyclobenzaprine] Nausea And Vomiting    Dizziness    Ibuprofen Hives   Penicillins     REACTION: hives   Tetanus Toxoids Swelling    Medications:  Prior to Admission medications   Medication Sig Start Date End Date Taking? Authorizing Provider  albuterol (PROVENTIL HFA;VENTOLIN HFA) 108 (90 Base) MCG/ACT inhaler INHALE 2 PUFFS INTO THE LUNGS EVERY 6 HOURS AS NEEDED FOR WHEEZING ORSHORTNESS OF BREATH. Patient taking differently: Inhale 2 puffs into the lungs every 6 (six) hours as needed for wheezing or shortness of breath. 01/09/19  Yes Lovenia Kim, MD  alendronate (FOSAMAX) 70 MG tablet TAKE 1 TABLET BY MOUTH ONCE WEEKLY 06/16/21  Yes Shary Key, DO  aspirin EC 81 MG  EC tablet Take 1 tablet (81 mg total) by mouth daily. 09/12/19  Yes Vann, Jessica U, DO  atorvastatin (LIPITOR) 80 MG tablet TAKE 1 TABLET BY MOUTH ONCE DAILY AT 6PM 05/12/21  Yes Paige, Victoria J, DO  azithromycin (ZITHROMAX Z-PAK) 250 MG tablet Take '500mg'$  (2 tablets) on day 1, then '250mg'$  (1 tablet) on day 2-5 11/09/20  Yes Mullis, Kiersten P, DO  benzonatate (TESSALON) 100 MG capsule Take 1 capsule (100 mg total) by mouth 2 (two) times daily as needed for cough. 11/09/20  Yes Mullis, Kiersten P, DO  carvedilol (COREG) 3.125 MG tablet TAKE 1 TABLET BY MOUTH TWICE DAILY WITH A MEAL 03/06/21  Yes Beard, Samantha N, DO  citalopram (CELEXA) 20 MG tablet TAKE 1 TABLET BY MOUTH ONCE DAILY 05/10/21  Yes Paige, Victoria J, DO  clotrimazole (LOTRIMIN) 1 % cream Apply 1 application topically 2 (two) times daily. Under breasts 08/03/20  Yes Beard, Samantha N, DO  folic acid (FOLVITE) 1 MG tablet TAKE 1 TABLET BY MOUTH ONCE A DAY 03/06/21  Yes Beard, Samantha N, DO  gabapentin (NEURONTIN) 300 MG capsule TAKE 3 CAPSULES BY MOUTH TWICE DAILY 06/16/21  Yes Paige, Victoria J, DO  HYDROcodone-acetaminophen (NORCO) 10-325 MG tablet Take 1-2 tablets by mouth See admin instructions. 1 tablet in the morning and 2 tablets at night 08/28/19  Yes [provider]  omeprazole (PRILOSEC) 20 MG capsule TAKE 1 CAPSULE BY MOUTH ONCE DAILY 04/11/21  Yes Beard, Samantha N, DO  PHENObarbital (LUMINAL) 100 MG tablet TAKE 1 TABLET BY MOUTH ONCE DAILY 04/28/21  Yes Kathrynn Ducking, MD  tiotropium (SPIRIVA HANDIHALER) 18 MCG inhalation capsule INHALE THE CONTENTS OF 1 CAPSULE VIA HANDIHALER BY MOUTH ONCE DAILY 08/03/20  Yes Patriciaann Clan, DO  topiramate (TOPAMAX) 50 MG tablet Take 2 tablets twice daily 07/28/20  Yes Suzzanne Cloud, NP  triamcinolone ointment (KENALOG) 0.1 % Apply 1 application topically 2 (two) times daily. Apply to front of your lower leg 08/03/20  Yes Beard, Samantha N, DO  Erenumab-aooe (AIMOVIG) 140 MG/ML SOAJ  Inject 140 mg into the skin every 30 (thirty) days. Patient not taking: Reported on 06/22/2021 12/08/20   Kathrynn Ducking, MD  Ubrogepant (UBRELVY) 50 MG TABS Take 50 mg by mouth 2 (two) times daily as needed. Patient not taking: Reported on 06/22/2021 12/08/20   Kathrynn Ducking, MD    ROS:  Out of a complete 14 system review of symptoms, the patient complains only of the following symptoms, and all other reviewed systems are negative.  Hand  tingling Headache Right foot pain Low back pain  Blood pressure 122/82, pulse 65, height '5\' 5"'$  (1.651 m), weight 208 lb (94.3 kg), SpO2 96 %.  Physical Exam  General: The patient is alert and cooperative at the time of the examination.  Skin: No significant peripheral edema is noted.   Neurologic Exam  Mental status: The patient is alert and oriented x 3 at the time of the examination. The patient has apparent normal recent and remote memory, with an apparently normal attention span and concentration ability.   Cranial nerves: Facial symmetry is present. Speech is normal, no aphasia or dysarthria is noted. Extraocular movements are full. Visual fields are full.  Motor: The patient has good strength in all 4 extremities.  Sensory examination: Soft touch sensation is symmetric on the face, arms, and legs.  Coordination: The patient has good finger-nose-finger, but she has decreased ability to perform heel-to-shin on both sides bilaterally.  Tinel's sign at the wrists is positive on the left, negative on the right.  Gait and station: The patient has a limping type gait on the right foot.  She is using a walker at this point.  Reflexes: Deep tendon reflexes are symmetric, but are depressed.   Assessment/Plan:  1.  History of seizures, well controlled  2.  Daily headache  3.  Right foot pain  4.  Left hand tingling  The patient will be set up for EMG and nerve conduction study to rule out carpal tunnel syndrome on the left.  She will  have nerve conductions on both arms, EMG on the left.  She will be placed back on Aimovig, the medication was approved through the end of this year.  She will continue her phenobarbital, will check blood work today.  The right foot pain appears to be mechanical in nature, may represent a flareup of her rheumatoid arthritis.  She will follow-up here in 6 months, in the future she can be seen through Dr. Jaynee Eagles.  Jill Alexanders MD 06/22/2021 3:51 PM  [facility] [address] [place], [place] [address]  Phone [phone] Fax [phone]

## 2021-06-23 LAB — COMPREHENSIVE METABOLIC PANEL
ALT: 14 IU/L (ref 0–32)
AST: 23 IU/L (ref 0–40)
Albumin/Globulin Ratio: 1.1 — ABNORMAL LOW (ref 1.2–2.2)
Albumin: 4.2 g/dL (ref 3.7–4.7)
Alkaline Phosphatase: 146 IU/L — ABNORMAL HIGH (ref 44–121)
BUN/Creatinine Ratio: 11 — ABNORMAL LOW (ref 12–28)
BUN: 10 mg/dL (ref 8–27)
Bilirubin Total: 0.2 mg/dL (ref 0.0–1.2)
CO2: 22 mmol/L (ref 20–29)
Calcium: 9.1 mg/dL (ref 8.7–10.3)
Chloride: 105 mmol/L (ref 96–106)
Creatinine, Ser: 0.93 mg/dL (ref 0.57–1.00)
Globulin, Total: 3.7 g/dL (ref 1.5–4.5)
Glucose: 94 mg/dL (ref 65–99)
Potassium: 5 mmol/L (ref 3.5–5.2)
Sodium: 141 mmol/L (ref 134–144)
Total Protein: 7.9 g/dL (ref 6.0–8.5)
eGFR: 66 mL/min/{1.73_m2} (ref >59)

## 2021-06-23 LAB — CBC WITH DIFFERENTIAL/PLATELET
Basophils Absolute: 0.1 10*3/uL (ref 0.0–0.2)
Basos: 1 %
EOS (ABSOLUTE): 0.1 10*3/uL (ref 0.0–0.4)
Eos: 2 %
Hematocrit: 37 % (ref 34.0–46.6)
Hemoglobin: 11.3 g/dL (ref 11.1–15.9)
Immature Grans (Abs): 0 10*3/uL (ref 0.0–0.1)
Immature Granulocytes: 0 %
Lymphocytes Absolute: 3.1 10*3/uL (ref 0.7–3.1)
Lymphs: 51 %
MCH: 25.2 pg — ABNORMAL LOW (ref 26.6–33.0)
MCHC: 30.5 g/dL — ABNORMAL LOW (ref 31.5–35.7)
MCV: 83 fL (ref 79–97)
Monocytes Absolute: 0.5 10*3/uL (ref 0.1–0.9)
Monocytes: 9 %
Neutrophils Absolute: 2.2 10*3/uL (ref 1.4–7.0)
Neutrophils: 37 %
Platelets: 199 10*3/uL (ref 150–450)
RBC: 4.48 x10E6/uL (ref 3.77–5.28)
RDW: 14.8 % (ref 11.7–15.4)
WBC: 6.1 10*3/uL (ref 3.4–10.8)

## 2021-06-23 LAB — PHENOBARBITAL LEVEL: Phenobarbital, Serum: 27 ug/mL (ref 15–40)

## 2021-06-23 LAB — SEDIMENTATION RATE: Sed Rate: 87 mm/hr — ABNORMAL HIGH (ref 0–40)

## 2021-06-27 ENCOUNTER — Telehealth: Payer: Self-pay

## 2021-06-27 NOTE — Telephone Encounter (Signed)
I called the pt and advised of results.  Pt verbalized understanding. She wanted to know if Dr. Jannifer Franklin had a recommendation for a foot MD she could see for the heel and foot discomfort.  Pt was advised this was Dr. Jannifer Franklin off week and during the mean time she could look into [facility] to see if she would like to set up an appt with them. Pt was agreeable to this.

## 2021-06-27 NOTE — Telephone Encounter (Signed)
I called the patient.  The patient had a very high sedimentation rate, she reported a rapid onset severe pain problem with her ankle associated with slight swelling and warmth.  No associated injury was noted with the ankle.  I would not contact a podiatrist to start with, I would call her rheumatologist, Dr. Veneta Penton for evaluation to determine whether this is related to her rheumatoid arthritis.

## 2021-06-27 NOTE — Telephone Encounter (Signed)
-----   Message from Kathrynn Ducking, MD sent at 06/23/2021  7:58 AM EDT ----- Blood work reveals a therapeutic phenobarbital level, no change in dosing recommended.  Chemistry panel is unremarkable exception of a mild stable elevation alkaline phosphatase level.  CBC is relatively unremarkable.  Sedimentation rate is significantly elevated suggesting that the new ankle and heel discomfort may be inflammatory in nature, possibly related to rheumatoid arthritis.  Please call the patient.  ----- Message ----- From: Lavone Neri Lab Results In Sent: 06/23/2021   5:37 AM EDT To: Kathrynn Ducking, MD

## 2021-07-04 ENCOUNTER — Encounter: Payer: Self-pay | Admitting: Internal Medicine

## 2021-07-07 ENCOUNTER — Other Ambulatory Visit: Payer: Self-pay | Admitting: Family Medicine

## 2021-07-07 DIAGNOSIS — F32A Depression, unspecified: Secondary | ICD-10-CM

## 2021-07-28 ENCOUNTER — Other Ambulatory Visit: Payer: Self-pay | Admitting: Family Medicine

## 2021-08-03 ENCOUNTER — Ambulatory Visit
Admission: RE | Admit: 2021-08-03 | Discharge: 2021-08-03 | Disposition: A | Discharge status: Home / Self Care | Payer: Medicare Other | Source: Ambulatory Visit | Attending: Emergency Medicine | Admitting: Emergency Medicine

## 2021-08-03 ENCOUNTER — Other Ambulatory Visit: Payer: Self-pay

## 2021-08-03 ENCOUNTER — Ambulatory Visit (INDEPENDENT_AMBULATORY_CARE_PROVIDER_SITE_OTHER): Payer: Medicare Other

## 2021-08-03 ENCOUNTER — Ambulatory Visit: Payer: Medicare Other

## 2021-08-03 VITALS — BP 132/90 | HR 79 | Temp 97.8°F | Resp 18

## 2021-08-03 DIAGNOSIS — R059 Cough, unspecified: Secondary | ICD-10-CM

## 2021-08-03 DIAGNOSIS — J441 Chronic obstructive pulmonary disease with (acute) exacerbation: Secondary | ICD-10-CM | POA: Diagnosis not present

## 2021-08-03 DIAGNOSIS — R058 Other specified cough: Secondary | ICD-10-CM

## 2021-08-03 DIAGNOSIS — R0602 Shortness of breath: Secondary | ICD-10-CM

## 2021-08-03 MED ORDER — AZITHROMYCIN 250 MG PO TABS: 250.0000 mg | ORAL_TABLET | Freq: Every day | ORAL | 0 refills | Status: DC

## 2021-08-03 MED ORDER — PREDNISONE 10 MG PO TABS: 40.0000 mg | ORAL_TABLET | Freq: Every day | ORAL | 0 refills | Status: AC

## 2021-08-03 NOTE — ED Triage Notes (Signed)
Patient presents to Urgent Care with complaints of chest congestion, wheezing, fever, dry cough, and bilateral ear pain x 2 weeks. Treating symptoms with cosentyx and inhaler with no relief. Pt has a hx of bronchitis.

## 2021-08-03 NOTE — Discharge Instructions (Addendum)
Your chest x-ray is normal.  Take the prednisone and Zithromax as directed.  Follow-up with your primary care provider tomorrow.

## 2021-08-03 NOTE — ED Provider Notes (Signed)
Roderic Palau    CSN: [id number] Arrival date & time: 08/03/21  [place]      History   Chief Complaint Chief Complaint  Patient presents with   Nasal Congestion   Cough   Otalgia    HPI [name] is a 70 y.o. female.  Patient presents with 2-week history of fever, ear pain, congestion, productive cough, wheezing, SOB.  T-max 102 which was 2 days ago.  She states this is similar to previous episodes of bronchitis.  She denies rash, vomiting, diarrhea, or other symptoms.  No OTC medication taken today.  Her medical history includes COPD, hypertension, stroke, seizures.  The history is provided by the patient and medical records.   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   COPD (chronic obstructive pulmonary disease) (HCC)    Depression    Ear pain, bilateral 03/30/2019   Fibromyalgia    08/18/15  [facility]   GERD (gastroesophageal reflux disease)    Headache 05/27/2013   History of CVA (cerebrovascular accident)    "11 minni srokes"   Hyperlipidemia    Hypertension    Migraine    Neck pain 02/21/2012   Personal history of colonic polyp - adenoma 03/01/2014   Personality disorder ([place])    Productive cough 11/09/2020   Rash and nonspecific skin eruption 08/04/2020   Rheumatoid arthritis ([place])    08/18/15  [facility]   Seizures ([place])    last seizure 4 years ago   Sleep apnea    Mild. No need for CPAP.   Stroke [facility])     Patient Active Problem List   Diagnosis Date Noted   Breast pain 03/28/2021   Syncope and collapse 02/28/2021   Rib pain on left side 02/28/2021   Bilateral leg pain 01/03/2021   Peripheral neuropathy 01/03/2021   Dyspnea on exertion 08/04/2020   CVA (cerebral vascular accident) ([place]) 09/09/2019   Coronary artery disease 10/16/2018   Hypersomnia with  sleep apnea 04/18/2017   Post traumatic stress disorder (PTSD) 04/18/2017   COPD mixed type ([place]) 04/18/2017   Cervical paraspinal muscle spasm 10/26/2016   Lower back pain 05/27/2016   Left hip pain 05/27/2016   Fibromyalgia 08/18/2015   Rheumatoid arthritis ([place]) 03/09/2015   Essential hypertension, benign 02/18/2015   Cervical disc disorder with radiculopathy of cervical region 01/21/2015   Personal history of colonic polyp - adenoma 03/01/2014   Dyshidrotic eczema 12/29/2013   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 Dr Acie Fredrickson.   CHOLECYSTECTOMY     CYST EXCISION     leg   ESOPHAGOGASTRODUODENOSCOPY (EGD) WITH PROPOFOL N/A 06/27/2018   Procedure: ESOPHAGOGASTRODUODENOSCOPY (EGD) WITH PROPOFOL;  Surgeon: Jonathon Bellows, MD;  Location: [facility] ENDOSCOPY;  Service: Gastroenterology;  Laterality: N/A;   FLANK MASS EXCISION     FOOT TENDON SURGERY  june 2014   both feet   KNEE SURGERY     Bilateral.   NOSE SURGERY     SHOULDER SURGERY     bil.   TOTAL ABDOMINAL HYSTERECTOMY      OB History   No obstetric history on file.      Home Medications    Prior to Admission medications   Medication Sig Start Date End Date Taking? Authorizing Provider  azithromycin (ZITHROMAX) 250 MG tablet Take 1 tablet (250 mg total) by mouth daily. Take first 2 tablets together, then 1 every day until finished. 08/03/21  Yes Sharion Balloon, NP  predniSONE  (DELTASONE) 10 MG tablet Take 4 tablets (40 mg total) by mouth daily for 5 days. 08/03/21 08/08/21 Yes Sharion Balloon, NP  albuterol (PROVENTIL HFA;VENTOLIN HFA) 108 (90 Base) MCG/ACT inhaler INHALE 2 PUFFS INTO THE LUNGS EVERY 6 HOURS AS NEEDED FOR WHEEZING ORSHORTNESS OF BREATH. Patient taking differently: Inhale 2 puffs into the lungs every 6 (six) hours as needed for wheezing or shortness of breath. 01/09/19   Lovenia Kim, MD  alendronate (FOSAMAX) 70 MG tablet TAKE 1 TABLET BY MOUTH ONCE WEEKLY 06/16/21   Shary Key, DO  aspirin EC 81 MG EC tablet Take 1 tablet (81 mg total) by mouth daily. 09/12/19   Geradine Girt, DO  atorvastatin (LIPITOR) 80 MG tablet TAKE 1 TABLET BY MOUTH ONCE DAILY AT 6PM 05/12/21   Shary Key, DO  benzonatate (TESSALON) 100 MG capsule Take 1 capsule (100 mg total) by mouth 2 (two) times daily as needed for cough. 11/09/20   Mullis, Kiersten P, DO  carvedilol (COREG) 3.125 MG tablet TAKE 1 TABLET BY MOUTH TWICE DAILY WITH A MEAL 03/06/21   Darrelyn Hillock N, DO  citalopram (CELEXA) 20 MG tablet TAKE 1 TABLET BY MOUTH ONCE DAILY 07/10/21   Shary Key, DO  clotrimazole (LOTRIMIN) 1 % cream Apply 1 application topically 2 (two) times daily. Under breasts 08/03/20   Patriciaann Clan, DO  Erenumab-aooe (AIMOVIG) 140 MG/ML SOAJ Inject 140 mg into the skin every 30 (thirty) days. 06/22/21   Kathrynn Ducking, MD  folic acid (FOLVITE) 1 MG tablet TAKE 1 TABLET BY MOUTH ONCE A DAY 03/06/21   Darrelyn Hillock N, DO  gabapentin (NEURONTIN) 300 MG capsule TAKE 3 CAPSULES BY MOUTH TWICE DAILY 06/16/21   Shary Key, DO  HYDROcodone-acetaminophen (NORCO) 10-325 MG tablet Take 1-2 tablets by mouth See admin instructions. 1 tablet in the morning and 2 tablets at night 08/28/19   [provider]  omeprazole (PRILOSEC) 20 MG capsule TAKE 1 CAPSULE BY MOUTH ONCE DAILY 08/01/21   Ezequiel Essex, MD  PHENObarbital (LUMINAL) 100 MG tablet TAKE 1 TABLET BY MOUTH ONCE  DAILY 04/28/21   Kathrynn Ducking, MD  tiotropium (SPIRIVA HANDIHALER) 18 MCG inhalation capsule INHALE THE CONTENTS OF 1 CAPSULE VIA HANDIHALER BY MOUTH ONCE DAILY 08/03/20   Patriciaann Clan, DO  topiramate (TOPAMAX) 50 MG tablet Take 2 tablets twice daily 07/28/20   Suzzanne Cloud, NP  triamcinolone ointment (KENALOG) 0.1 % Apply 1 application topically 2 (two) times daily. Apply to front of your lower leg 08/03/20   Patriciaann Clan, DO    Family History Family History  Problem Relation Age of Onset   Cirrhosis Mother    Diabetes Mother  Cirrhosis Father    Breast cancer Sister 26   Breast cancer Sister 36   Colon cancer Neg Hx     Social History Social History   Tobacco Use   Smoking status: Former   Smokeless tobacco: Never  Scientific laboratory technician Use: Never used  Substance Use Topics   Alcohol use: No   Drug use: No     Allergies   Amoxicillin, Aspirin, Codeine phosphate, Flexeril [cyclobenzaprine], Ibuprofen, Penicillins, and Tetanus toxoids   Review of Systems Review of Systems  Constitutional:  Positive for fever. Negative for chills.  HENT:  Positive for congestion and ear pain. Negative for sore throat.   Respiratory:  Positive for cough, shortness of breath and wheezing.   Cardiovascular:  Negative for chest pain and palpitations.  Gastrointestinal:  Negative for abdominal pain, diarrhea and vomiting.  Skin:  Negative for color change and rash.  All other systems reviewed and are negative.   Physical Exam Triage Vital Signs ED Triage Vitals  Enc Vitals Group     BP      Pulse      Resp      Temp      Temp src      SpO2      Weight      Height      Head Circumference      Peak Flow      Pain Score      Pain Loc      Pain Edu?      Excl. in [place]?    No data found.  Updated Vital Signs BP 132/90 (BP Location: Left Arm)   Pulse 79   Temp 97.8 F (36.6 C) (Oral)   Resp 18   SpO2 98%   Visual Acuity Right Eye Distance:   Left Eye  Distance:   Bilateral Distance:    Right Eye Near:   Left Eye Near:    Bilateral Near:     Physical Exam Vitals and nursing note reviewed.  Constitutional:      General: She is not in acute distress.    Appearance: She is well-developed.  HENT:     Head: Normocephalic and atraumatic.     Right Ear: Tympanic membrane normal.     Left Ear: Tympanic membrane normal.     Nose: Congestion present.     Mouth/Throat:     Mouth: Mucous membranes are moist.     Pharynx: Oropharynx is clear.  Eyes:     Conjunctiva/sclera: Conjunctivae normal.  Cardiovascular:     Rate and Rhythm: Normal rate and regular rhythm.     Heart sounds: Normal heart sounds.  Pulmonary:     Effort: Pulmonary effort is normal. No respiratory distress.     Breath sounds: Wheezing and rhonchi present.  Abdominal:     Palpations: Abdomen is soft.     Tenderness: There is no abdominal tenderness.  Musculoskeletal:     Cervical back: Neck supple.  Skin:    General: Skin is warm and dry.  Neurological:     Mental Status: She is alert.  Psychiatric:        Mood and Affect: Mood normal.        Behavior: Behavior normal.     UC Treatments / Results  Labs (all labs ordered are listed, but only abnormal results are displayed) Labs Reviewed  COVID-19, FLU A+B NAA    EKG   Radiology DG Chest 2 View  Result Date:  08/03/2021 CLINICAL DATA:  Productive cough.  Shortness of breath. EXAM: CHEST - 2 VIEW COMPARISON:  02/28/2021 FINDINGS: The lungs are clear without focal pneumonia, edema, pneumothorax or pleural effusion. The cardiopericardial silhouette is within normal limits for size. Old right-sided rib fractures evident. IMPRESSION: No active cardiopulmonary disease. Electronically Signed   By: Misty Stanley M.D.   On: 08/03/2021 19:27    Procedures Procedures (including critical care time)  Medications Ordered in UC Medications - No data to display  Initial Impression / Assessment and Plan / UC Course   I have reviewed the triage vital signs and the nursing notes.  Pertinent labs & imaging results that were available during my care of the patient were reviewed by me and considered in my medical decision making (see chart for details).  Shortness of breath, productive cough, COPD exacerbation.  Chest x-ray negative.  Flu and COVID pending.  Treating with prednisone and Zithromax.  Instructed patient to continue albuterol inhaler.  Instructed her to follow-up with her PCP tomorrow.  ED precautions discussed.  Patient agrees to plan of care.   Final Clinical Impressions(s) / UC Diagnoses   Final diagnoses:  SOB (shortness of breath)  Productive cough  COPD exacerbation ([place])     Discharge Instructions      Your chest x-ray is normal.  Take the prednisone and Zithromax as directed.  Follow-up with your primary care provider tomorrow.         ED Prescriptions     Medication Sig Dispense Auth. Provider   predniSONE (DELTASONE) 10 MG tablet Take 4 tablets (40 mg total) by mouth daily for 5 days. 20 tablet Sharion Balloon, NP   azithromycin (ZITHROMAX) 250 MG tablet Take 1 tablet (250 mg total) by mouth daily. Take first 2 tablets together, then 1 every day until finished. 6 tablet Sharion Balloon, NP      PDMP not reviewed this encounter.   Sharion Balloon, NP 08/03/21 [phone]

## 2021-08-05 LAB — COVID-19, FLU A+B NAA
Influenza A, NAA: NOT DETECTED
Influenza B, NAA: NOT DETECTED
SARS-CoV-2, NAA: NOT DETECTED

## 2021-08-17 ENCOUNTER — Encounter (INDEPENDENT_AMBULATORY_CARE_PROVIDER_SITE_OTHER): Payer: Medicare Other | Admitting: Diagnostic Neuroimaging

## 2021-08-17 ENCOUNTER — Ambulatory Visit (INDEPENDENT_AMBULATORY_CARE_PROVIDER_SITE_OTHER): Payer: Medicare Other | Admitting: Diagnostic Neuroimaging

## 2021-08-17 ENCOUNTER — Other Ambulatory Visit: Payer: Self-pay

## 2021-08-17 DIAGNOSIS — Z0289 Encounter for other administrative examinations: Secondary | ICD-10-CM

## 2021-08-17 DIAGNOSIS — R202 Paresthesia of skin: Secondary | ICD-10-CM | POA: Diagnosis not present

## 2021-08-17 NOTE — Procedures (Signed)
[facility]  NCS (NERVE CONDUCTION STUDY) WITH EMG (ELECTROMYOGRAPHY) REPORT   STUDY DATE: 08/17/21 PATIENT NAME: [name] DOB: 11-21-1950 MRN: [id number]  ORDERING CLINICIAN: Kathrynn Ducking, MD   TECHNOLOGIST: Sherre Scarlet ELECTROMYOGRAPHER: Earlean Polka. Caran Storck, MD  CLINICAL INFORMATION: 70 year old female with bilateral upper extremity numbness and weakness.  FINDINGS: NERVE CONDUCTION STUDY:  Bilateral median and ulnar motor responses are normal.  Bilateral median ulnar sensory responses are normal.  Bilateral ulnar F-wave latencies are normal.   NEEDLE ELECTROMYOGRAPHY:  Needle examination of left upper extremity is normal.   IMPRESSION:   Normal study.  No electrodiagnostic evidence of large fiber neuropathy at this time.   INTERPRETING PHYSICIAN:  Penni Bombard, MD Certified in Neurology, Neurophysiology and Neuroimaging  [facility] [address], [place], [place] [address] [phone]   [facility]    Nerve / Sites Muscle Latency Ref. Amplitude Ref. Rel Amp Segments Distance Velocity Ref. Area    ms ms mV mV %  cm m/s m/s mVms  L Median - APB     Wrist APB 2.9 ?4.4 7.3 ?4.0 100 Wrist - APB 7   26.6     Upper arm APB 7.1  7.1  97.6 Upper arm - Wrist 22 52 ?49 24.7  R Median - APB     Wrist APB 3.3 ?4.4 8.5 ?4.0 100 Wrist - APB 7   30.6     Upper arm APB 7.2  8.2  96.7 Upper arm - Wrist 21 54 ?49 28.5  L Ulnar - ADM     Wrist ADM 3.0 ?3.3 6.0 ?6.0 100 Wrist - ADM 7   15.6     B.Elbow ADM 6.5  5.5  90.2 B.Elbow - Wrist 20 56 ?49 15.1     A.Elbow ADM 8.3  5.5  100 A.Elbow - B.Elbow 10 56 ?49 15.5  R Ulnar - ADM     Wrist ADM 2.9 ?3.3 9.3 ?6.0 100 Wrist - ADM 7   28.2     B.Elbow ADM 6.4  9.3  100 B.Elbow - Wrist 20 57 ?49 26.0     A.Elbow ADM 8.3  8.4  90.1 A.Elbow - B.Elbow 10 52 ?49 25.2             SNC    Nerve / Sites Rec. Site Peak Lat Ref.  Amp Ref. Segments Distance Peak Diff Ref.    ms ms  V V  cm ms ms  R Median - Orthodromic (Dig II, Mid palm)     Dig II Wrist 2.8 ?3.4 12 ?10 Dig II - Wrist 13    L Median - Orthodromic (Dig II, Mid palm)     Dig II Wrist 2.7 ?3.4 14 ?10 Dig II - Wrist 13    R Ulnar - Orthodromic, (Dig V, Mid palm)     Dig V Wrist 2.7 ?3.1 5 ?5 Dig V - Wrist 11    L Ulnar - Orthodromic, (Dig V, Mid palm)     Dig V Wrist 2.5 ?3.1 8 ?5 Dig V - Wrist 78               F  Wave    Nerve F Lat Ref.   ms ms  L Ulnar - ADM 29.0 ?32.0  R Ulnar - ADM 29.0 ?32.0         EMG Summary Table    Spontaneous MUAP Recruitment  Muscle IA Fib PSW Fasc Other  Amp Dur. Poly Pattern  L. Deltoid Normal None None None _______ Normal Normal Normal Normal  L. Triceps brachii Normal None None None _______ Normal Normal Normal Normal  L. Biceps brachii Normal None None None _______ Normal Normal Normal Normal  L. Flexor carpi radialis Normal None None None _______ Normal Normal Normal Normal  L. First dorsal interosseous Normal None None None _______ Normal Normal Normal Normal

## 2021-09-04 ENCOUNTER — Other Ambulatory Visit: Payer: Self-pay | Admitting: Family Medicine

## 2021-09-04 DIAGNOSIS — I10 Essential (primary) hypertension: Secondary | ICD-10-CM

## 2021-10-24 ENCOUNTER — Other Ambulatory Visit: Payer: Self-pay

## 2021-10-24 ENCOUNTER — Other Ambulatory Visit: Payer: Self-pay | Admitting: Neurology

## 2021-10-24 MED ORDER — PHENOBARBITAL 100 MG PO TABS: 100.0000 mg | ORAL_TABLET | Freq: Every day | ORAL | 1 refills | Status: DC

## 2021-10-24 NOTE — Progress Notes (Signed)
[facility] drug registry checked, last filled  07/28/2021. Pt last seen 06/22/21 with Dr Jannifer Franklin. Has upcoming appt 12/21/21. Refills is appropriate, please advise

## 2021-10-24 NOTE — Telephone Encounter (Signed)
Rx refilled.

## 2021-11-07 ENCOUNTER — Other Ambulatory Visit: Payer: Self-pay | Admitting: Family Medicine

## 2021-11-07 DIAGNOSIS — F32A Depression, unspecified: Secondary | ICD-10-CM

## 2021-12-06 ENCOUNTER — Other Ambulatory Visit: Payer: Self-pay | Admitting: Family Medicine

## 2021-12-06 DIAGNOSIS — G8929 Other chronic pain: Secondary | ICD-10-CM

## 2021-12-15 ENCOUNTER — Other Ambulatory Visit: Payer: Self-pay

## 2021-12-15 ENCOUNTER — Ambulatory Visit
Admission: EM | Admit: 2021-12-15 | Discharge: 2021-12-15 | Disposition: A | Discharge status: Home / Self Care | Payer: Medicare Other | Attending: Student | Admitting: Student

## 2021-12-15 ENCOUNTER — Encounter: Payer: Self-pay | Admitting: Emergency Medicine

## 2021-12-15 DIAGNOSIS — Z87891 Personal history of nicotine dependence: Secondary | ICD-10-CM

## 2021-12-15 DIAGNOSIS — Z880 Allergy status to penicillin: Secondary | ICD-10-CM

## 2021-12-15 DIAGNOSIS — J441 Chronic obstructive pulmonary disease with (acute) exacerbation: Secondary | ICD-10-CM | POA: Diagnosis not present

## 2021-12-15 MED ORDER — ALBUTEROL SULFATE (2.5 MG/3ML) 0.083% IN NEBU
2.5000 mg | INHALATION_SOLUTION | Freq: Once | RESPIRATORY_TRACT | Status: AC
  Administered 2021-12-15: 2.5 mg via RESPIRATORY_TRACT

## 2021-12-15 MED ORDER — DOXYCYCLINE HYCLATE 100 MG PO CAPS: 100.0000 mg | ORAL_CAPSULE | Freq: Two times a day (BID) | ORAL | 0 refills | Status: AC

## 2021-12-15 MED ORDER — ALBUTEROL SULFATE HFA 108 (90 BASE) MCG/ACT IN AERS: 1.0000 | INHALATION_SPRAY | Freq: Four times a day (QID) | RESPIRATORY_TRACT | 0 refills | Status: DC | PRN

## 2021-12-15 NOTE — ED Provider Notes (Signed)
Roderic Palau    CSN: [id number] Arrival date & time: 12/15/21  1646      History   Chief Complaint Chief Complaint  Patient presents with   Cough    HPI [name] is a 71 y.o. female presenting with cough and chest congestion x2 weeks. History COPD, former smoker. States cough is productive of yellow sputum. Wheezing at night relieved by albuterol inhaler; she is using the inhaler 2-3x daily with some relief. DOE but minimal SOB at rest. Denies fevers/chills, CP, dizziness, weakness.   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   COPD (chronic obstructive pulmonary disease) (HCC)    Depression    Ear pain, bilateral 03/30/2019   Fibromyalgia    08/18/15  [facility]   GERD (gastroesophageal reflux disease)    Headache 05/27/2013   History of CVA (cerebrovascular accident)    "11 minni srokes"   Hyperlipidemia    Hypertension    Migraine    Neck pain 02/21/2012   Personal history of colonic polyp - adenoma 03/01/2014   Personality disorder ([place])    Productive cough 11/09/2020   Rash and nonspecific skin eruption 08/04/2020   Rheumatoid arthritis ([place])    08/18/15  [facility]   Seizures ([place])    last seizure 4 years ago   Sleep apnea    Mild. No need for CPAP.   Stroke [facility])     Patient Active Problem List   Diagnosis Date Noted   Breast pain 03/28/2021   Syncope and collapse 02/28/2021   Rib pain on left side 02/28/2021   Bilateral leg pain 01/03/2021   Peripheral neuropathy 01/03/2021   Dyspnea on exertion 08/04/2020   CVA (cerebral vascular accident) ([place]) 09/09/2019   Coronary artery disease 10/16/2018   Hypersomnia with sleep apnea 04/18/2017   Post traumatic stress disorder (PTSD) 04/18/2017   COPD mixed type ([place]) 04/18/2017   Cervical paraspinal muscle  spasm 10/26/2016   Lower back pain 05/27/2016   Left hip pain 05/27/2016   Fibromyalgia 08/18/2015   Rheumatoid arthritis ([place]) 03/09/2015   Essential hypertension, benign 02/18/2015   Cervical disc disorder with radiculopathy of cervical region 01/21/2015   Personal history of colonic polyp - adenoma 03/01/2014   Dyshidrotic eczema 12/29/2013   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 Dr Acie Fredrickson.   CHOLECYSTECTOMY     CYST EXCISION     leg   ESOPHAGOGASTRODUODENOSCOPY (EGD) WITH PROPOFOL N/A 06/27/2018   Procedure: ESOPHAGOGASTRODUODENOSCOPY (EGD) WITH PROPOFOL;  Surgeon: Jonathon Bellows, MD;  Location: [facility] ENDOSCOPY;  Service: Gastroenterology;  Laterality: N/A;   FLANK MASS EXCISION     FOOT TENDON SURGERY  june 2014  both feet   KNEE SURGERY     Bilateral.   NOSE SURGERY     SHOULDER SURGERY     bil.   TOTAL ABDOMINAL HYSTERECTOMY      OB History   No obstetric history on file.      Home Medications    Prior to Admission medications   Medication Sig Start Date End Date Taking? Authorizing Provider  albuterol (VENTOLIN HFA) 108 (90 Base) MCG/ACT inhaler Inhale 1-2 puffs into the lungs every 6 (six) hours as needed for wheezing or shortness of breath. 12/15/21  Yes Hazel Sams, PA-C  doxycycline (VIBRAMYCIN) 100 MG capsule Take 1 capsule (100 mg total) by mouth 2 (two) times daily for 7 days. 12/15/21 12/22/21 Yes Hazel Sams, PA-C  alendronate (FOSAMAX) 70 MG tablet TAKE 1 TABLET BY MOUTH ONCE WEEKLY 12/06/21   Shary Key, DO  aspirin EC 81 MG EC tablet Take 1 tablet (81 mg total) by mouth daily. 09/12/19   Geradine Girt, DO  atorvastatin (LIPITOR) 80 MG tablet TAKE 1 TABLET BY MOUTH ONCE DAILY AT 6PM 12/06/21   Shary Key, DO  azithromycin (ZITHROMAX) 250 MG tablet Take 1 tablet (250 mg total) by mouth daily. Take first 2 tablets together, then 1 every day until finished. 08/03/21   Sharion Balloon, NP  benzonatate (TESSALON) 100 MG capsule Take 1 capsule (100 mg total) by mouth 2 (two) times daily as needed for cough. 11/09/20   Mullis, Kiersten P, DO  carvedilol (COREG) 3.125 MG tablet TAKE 1 TABLET BY MOUTH TWICE DAILY WITH A MEAL 09/06/21   Paige, [place] J, DO  citalopram (CELEXA) 20 MG tablet TAKE 1 TABLET BY MOUTH ONCE DAILY 11/07/21   Shary Key, DO  clotrimazole (LOTRIMIN) 1 % cream Apply 1 application topically 2 (two) times daily. Under breasts 08/03/20   Patriciaann Clan, DO  Erenumab-aooe (AIMOVIG) 140 MG/ML SOAJ Inject 140 mg into the skin every 30 (thirty) days. 06/22/21   Kathrynn Ducking, MD  folic acid (FOLVITE) 1 MG tablet TAKE 1 TABLET BY MOUTH ONCE A DAY 11/07/21   Paige, [place] J, DO  gabapentin (NEURONTIN) 300 MG capsule TAKE 3 CAPSULES BY MOUTH TWICE DAILY 12/06/21   Shary Key, DO  HYDROcodone-acetaminophen (NORCO) 10-325 MG tablet Take 1-2 tablets by mouth See admin instructions. 1 tablet in the morning and 2 tablets at night 08/28/19   [provider]  omeprazole (PRILOSEC) 20 MG capsule TAKE 1 CAPSULE BY MOUTH ONCE DAILY 12/06/21   Arby Barrette, Weldon Picking, DO  PHENObarbital (LUMINAL) 100 MG tablet Take 1 tablet (100 mg total) by mouth daily. 10/24/21   Suzzanne Cloud, NP  tiotropium (SPIRIVA HANDIHALER) 18 MCG inhalation capsule INHALE THE CONTENTS OF 1 CAPSULE VIA HANDIHALER BY MOUTH ONCE DAILY 08/03/20   Patriciaann Clan, DO  topiramate (TOPAMAX) 50 MG tablet TAKE  2 TABLETS BY MOUTH TWICE A DAY. NEED APPT FOR FURTHER REILLS. 10/24/21   Suzzanne Cloud, NP  triamcinolone ointment (KENALOG) 0.1 % Apply 1 application topically 2 (two) times daily. Apply to front of your lower leg 08/03/20   Patriciaann Clan, DO    Family History Family History  Problem Relation Age of Onset   Cirrhosis Mother    Diabetes Mother    Cirrhosis Father    Breast cancer Sister 49   Breast cancer Sister 10   Colon cancer Neg Hx     Social History Social History  Tobacco Use   Smoking status: Former   Smokeless tobacco: Never  Vaping Use   Vaping Use: Never used  Substance Use Topics   Alcohol use: No   Drug use: No     Allergies   Amoxicillin, Aspirin, Codeine phosphate, Flexeril [cyclobenzaprine], Ibuprofen, Penicillins, and Tetanus toxoids   Review of Systems Review of Systems  Constitutional:  Negative for appetite change, chills and fever.  HENT:  Positive for congestion. Negative for ear pain, rhinorrhea, sinus pressure, sinus pain and sore throat.   Eyes:  Negative for redness and visual disturbance.  Respiratory:  Positive for cough, shortness of breath and wheezing. Negative for chest tightness.   Cardiovascular:  Negative for chest pain and palpitations.  Gastrointestinal:  Negative for abdominal pain, constipation, diarrhea, nausea and vomiting.  Genitourinary:  Negative for dysuria, frequency and urgency.  Musculoskeletal:  Negative for myalgias.  Neurological:  Negative for dizziness, weakness and headaches.  Psychiatric/Behavioral:  Negative for confusion.   All other systems reviewed and are negative.   Physical Exam Triage Vital Signs ED Triage Vitals  Enc Vitals Group     BP 12/15/21 1700 113/78     Pulse Rate 12/15/21 1700 75     Resp 12/15/21 1700 18     Temp 12/15/21 1700 98.2 F (36.8 C)     Temp src --      SpO2 12/15/21 1700 97 %     Weight --      Height --      Head Circumference --      Peak Flow --      Pain Score  12/15/21 1705 9     Pain Loc --      Pain Edu? --      Excl. in [place]? --    No data found.  Updated Vital Signs BP 113/78    Pulse 75    Temp 98.2 F (36.8 C)    Resp 18    SpO2 97%   Visual Acuity Right Eye Distance:   Left Eye Distance:   Bilateral Distance:    Right Eye Near:   Left Eye Near:    Bilateral Near:     Physical Exam Vitals reviewed.  Constitutional:      General: She is not in acute distress.    Appearance: Normal appearance. She is not ill-appearing or diaphoretic.  HENT:     Head: Normocephalic and atraumatic.  Cardiovascular:     Rate and Rhythm: Normal rate and regular rhythm.     Heart sounds: Normal heart sounds.  Pulmonary:     Effort: Pulmonary effort is normal.     Breath sounds: Normal breath sounds. No decreased breath sounds, wheezing, rhonchi or rales.     Comments: Lungs clear to auscultation. Breath sounds full throughout. Oxygenating comfortably. Chest:     Chest wall: Tenderness present.     Comments: TTP anterior chest wall  Skin:    General: Skin is warm.  Neurological:     General: No focal deficit present.     Mental Status: She is alert and oriented to person, place, and time.  Psychiatric:        Mood and Affect: Mood normal.        Behavior: Behavior normal.        Thought Content: Thought content normal.        Judgment: Judgment normal.     UC Treatments / Results  Labs (all labs ordered are listed, but only  abnormal results are displayed) Labs Reviewed - No data to display  EKG   Radiology No results found.  Procedures Procedures (including critical care time)  Medications Ordered in UC Medications  albuterol (PROVENTIL) (2.5 MG/3ML) 0.083% nebulizer solution 2.5 mg (2.5 mg Nebulization Given 12/15/21 1726)    Initial Impression / Assessment and Plan / UC Course  I have reviewed the triage vital signs and the nursing notes.  Pertinent labs & imaging results that were available during my care of the patient  were reviewed by me and considered in my medical decision making (see chart for details).     This patient is a very pleasant 71 y.o. year old female presenting with COPD exacerbation. Afebrile, nontachy. No adventitious breath sounds on exam; breath sounds full throughout so low concern for pneumothorax. Oxygenating comfortably. CXR deferred at this time; she is in agreement. Nebulizer treatment administered with some improvement in SOB.  Did not check a Covid or influenza test given duration of symptoms >2 weeks.   Will manage as COPD exacerbation. Former smoker. Penicillin allergy. Doxycycline sent. Albuterol inhaler refilled.   Strict ED return precautions discussed. Patient verbalizes understanding and agreement.   Coding Level 4 for acute exacerbation of chronic condition, and prescription drug management   Final Clinical Impressions(s) / UC Diagnoses   Final diagnoses:  COPD exacerbation ([place])  Penicillin allergy  Former smoker     Discharge Instructions      -Start the antibiotic, doxycycline twice daily for 7 days.  Make sure to wear sunscreen while spending periods of time outside on this medication as it can increase your chance of sunburn. -Albuterol inhaler as needed for cough, wheezing, shortness of breath, 1 to 2 puffs every 6 hours as needed. -Follow-up if symptoms worsen instead of improve - shortness of breath, chest pain at rest, new fevers, etc.      ED Prescriptions     Medication Sig Dispense Auth. Provider   albuterol (VENTOLIN HFA) 108 (90 Base) MCG/ACT inhaler Inhale 1-2 puffs into the lungs every 6 (six) hours as needed for wheezing or shortness of breath. 1 each Hazel Sams, PA-C   doxycycline (VIBRAMYCIN) 100 MG capsule Take 1 capsule (100 mg total) by mouth 2 (two) times daily for 7 days. 14 capsule Hazel Sams, PA-C      PDMP not reviewed this encounter.   Hazel Sams, PA-C 12/15/21 1731

## 2021-12-15 NOTE — ED Triage Notes (Addendum)
Pt presents with cough, SOB and chest congestion x 2 weeks.

## 2021-12-15 NOTE — Discharge Instructions (Addendum)
-  Start the antibiotic, doxycycline twice daily for 7 days.  Make sure to wear sunscreen while spending periods of time outside on this medication as it can increase your chance of sunburn. -Albuterol inhaler as needed for cough, wheezing, shortness of breath, 1 to 2 puffs every 6 hours as needed. -Follow-up if symptoms worsen instead of improve - shortness of breath, chest pain at rest, new fevers, etc.

## 2021-12-20 NOTE — Progress Notes (Unsigned)
PATIENT: [name] DOB: [birth year]  REASON FOR VISIT: Follow up seizures, headache, cerebrovascular disease HISTORY FROM: Patient PRIMARY NEUROLOGIST: Dr. Jaynee Eagles   HISTORY OF PRESENT ILLNESS: Today 12/20/21 [name] here today for follow-up.  History of seizures on phenobarbital. NCV/EMG was normal, no evidence of large fiber neuropathy.   HISTORY 06/22/2021 Dr. Jannifer Franklin: [name] is a 71 year old right-handed black female with a history of rheumatoid arthritis, cerebrovascular disease, headache, and a history of seizures.  She has been well controlled with her seizures on phenobarbital.  She has had ongoing daily headaches with sharp shooting pains in the temporal regions.  She was placed on Aimovig but never started this.  Roselyn Meier was not approved through the insurance.  She claims that she has stopped the [facility] powders.  Within the last 3 weeks, she had spontaneous onset of right heel and ankle pain, worse with weightbearing.  X-rays have been unrevealing.  She reports that there is some slight swelling of the right foot and ankle, and the ankle feels warm.  She reports that she feels weak all over.  She also reports some tingling sensations in the morning involving the left hand.  She comes back here for further evaluation.  She does report chronic low back pain and some pain down the left leg, not the right leg.    REVIEW OF SYSTEMS: Out of a complete 14 system review of symptoms, the patient complains only of the following symptoms, and all other reviewed systems are negative.  ALLERGIES: Allergies  Allergen Reactions   Amoxicillin     REACTION: hives   Aspirin     REACTION: rash   Codeine Phosphate     REACTION: N/V   Flexeril [Cyclobenzaprine] Nausea And Vomiting    Dizziness    Ibuprofen Hives   Penicillins     REACTION: hives   Tetanus Toxoids Swelling    HOME MEDICATIONS: Outpatient Medications Prior to Visit  Medication Sig Dispense Refill   albuterol  (VENTOLIN HFA) 108 (90 Base) MCG/ACT inhaler Inhale 1-2 puffs into the lungs every 6 (six) hours as needed for wheezing or shortness of breath. 1 each 0   alendronate (FOSAMAX) 70 MG tablet TAKE 1 TABLET BY MOUTH ONCE WEEKLY 12 tablet 1   aspirin EC 81 MG EC tablet Take 1 tablet (81 mg total) by mouth daily. 30 tablet 0   atorvastatin (LIPITOR) 80 MG tablet TAKE 1 TABLET BY MOUTH ONCE DAILY AT 6PM 30 tablet 4   azithromycin (ZITHROMAX) 250 MG tablet Take 1 tablet (250 mg total) by mouth daily. Take first 2 tablets together, then 1 every day until finished. 6 tablet 0   benzonatate (TESSALON) 100 MG capsule Take 1 capsule (100 mg total) by mouth 2 (two) times daily as needed for cough. 20 capsule 0   carvedilol (COREG) 3.125 MG tablet TAKE 1 TABLET BY MOUTH TWICE DAILY WITH A MEAL 180 tablet 1   citalopram (CELEXA) 20 MG tablet TAKE 1 TABLET BY MOUTH ONCE DAILY 30 tablet 2   clotrimazole (LOTRIMIN) 1 % cream Apply 1 application topically 2 (two) times daily. Under breasts 30 g 1   doxycycline (VIBRAMYCIN) 100 MG capsule Take 1 capsule (100 mg total) by mouth 2 (two) times daily for 7 days. 14 capsule 0   Erenumab-aooe (AIMOVIG) 140 MG/ML SOAJ Inject 140 mg into the skin every 30 (thirty) days. 5.59 mL 4   folic acid (FOLVITE) 1 MG tablet TAKE 1 TABLET BY MOUTH ONCE A DAY  30 tablet 1   gabapentin (NEURONTIN) 300 MG capsule TAKE 3 CAPSULES BY MOUTH TWICE DAILY 180 capsule 2   HYDROcodone-acetaminophen (NORCO) 10-325 MG tablet Take 1-2 tablets by mouth See admin instructions. 1 tablet in the morning and 2 tablets at night     omeprazole (PRILOSEC) 20 MG capsule TAKE 1 CAPSULE BY MOUTH ONCE DAILY 30 capsule 3   PHENObarbital (LUMINAL) 100 MG tablet Take 1 tablet (100 mg total) by mouth daily. 90 tablet 1   tiotropium (SPIRIVA HANDIHALER) 18 MCG inhalation capsule INHALE THE CONTENTS OF 1 CAPSULE VIA HANDIHALER BY MOUTH ONCE DAILY 30 capsule 3   topiramate (TOPAMAX) 50 MG tablet TAKE 2 TABLETS BY MOUTH  TWICE A DAY. NEED APPT FOR FURTHER REILLS. 120 tablet 5   triamcinolone ointment (KENALOG) 0.1 % Apply 1 application topically 2 (two) times daily. Apply to front of your lower leg 30 g 1   No facility-administered medications prior to visit.    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   COPD (chronic obstructive pulmonary disease) (HCC)    Depression    Ear pain, bilateral 03/30/2019   Fibromyalgia    08/18/15  [facility]   GERD (gastroesophageal reflux disease)    Headache 05/27/2013   History of CVA (cerebrovascular accident)    "11 minni srokes"   Hyperlipidemia    Hypertension    Migraine    Neck pain 02/21/2012   Personal history of colonic polyp - adenoma 03/01/2014   Personality disorder ([place])    Productive cough 11/09/2020   Rash and nonspecific skin eruption 08/04/2020   Rheumatoid arthritis ([place])    08/18/15  [facility]   Seizures ([place])    last seizure 4 years ago   Sleep apnea    Mild. No need for CPAP.   Stroke [facility])     PAST SURGICAL HISTORY: Past Surgical History:  Procedure Laterality Date   ANKLE SURGERY Left    APPENDECTOMY     BACK SURGERY     BUNIONECTOMY     2 toes   CARDIAC CATHETERIZATION  2008   Normal. Dr Acie Fredrickson.   CHOLECYSTECTOMY     CYST EXCISION     leg   ESOPHAGOGASTRODUODENOSCOPY (EGD) WITH PROPOFOL N/A 06/27/2018   Procedure: ESOPHAGOGASTRODUODENOSCOPY (EGD) WITH PROPOFOL;  Surgeon: Jonathon Bellows, MD;  Location: [facility] ENDOSCOPY;  Service: Gastroenterology;  Laterality: N/A;   FLANK MASS EXCISION     FOOT TENDON SURGERY  june 2014   both feet   KNEE SURGERY     Bilateral.   NOSE SURGERY     SHOULDER SURGERY     bil.   TOTAL ABDOMINAL HYSTERECTOMY      FAMILY HISTORY: Family History  Problem Relation Age of Onset   Cirrhosis  Mother    Diabetes Mother    Cirrhosis Father    Breast cancer Sister 60   Breast cancer Sister 78   Colon cancer Neg Hx     SOCIAL HISTORY: Social History   Socioeconomic History   Marital status: Married    Spouse name: Clearence    Number of children: 1   Years of education: 11   Highest education level: Not on file  Occupational History   Occupation: Unemployed  Tobacco Use   Smoking status: Former   Smokeless tobacco: Never  Vaping Use   Vaping Use: Never used  Substance and Sexual Activity   Alcohol use: No   Drug use: No   Sexual activity: Not on file  Other Topics Concern   Not on file  Social History Narrative   Patient lives at home with her husband [name].    Right Handed   Drinks no caffeine   Social Determinants of Radio broadcast assistant Strain: Not on file  Food Insecurity: Not on file  Transportation Needs: Not on file  Physical Activity: Not on file  Stress: Not on file  Social Connections: Not on file  Intimate Partner Violence: Not on file      PHYSICAL EXAM  There were no vitals filed for this visit. There is no height or weight on file to calculate BMI.  Generalized: Well developed, in no acute distress   Neurological examination  Mentation: Alert oriented to time, place, history taking. Follows all commands speech and language fluent Cranial nerve II-XII: Pupils were equal round reactive to light. Extraocular movements were full, visual field were full on confrontational test. Facial sensation and strength were normal. Uvula tongue midline. Head turning and shoulder shrug  were normal and symmetric. Motor: The motor testing reveals 5 over 5 strength of all 4 extremities. Good symmetric motor tone is noted throughout.  Sensory: Sensory testing is intact to soft touch on all 4 extremities. No evidence of extinction is noted.  Coordination: Cerebellar testing reveals good finger-nose-finger and heel-to-shin bilaterally.  Gait and  station: Gait is normal. Tandem gait is normal. Romberg is negative. No drift is seen.  Reflexes: Deep tendon reflexes are symmetric and normal bilaterally.   DIAGNOSTIC DATA (LABS, IMAGING, TESTING) - I reviewed patient records, labs, notes, testing and imaging myself where available.  Lab Results  Component Value Date   WBC 6.1 06/22/2021   HGB 11.3 06/22/2021   HCT 37.0 06/22/2021   MCV 83 06/22/2021   PLT 199 06/22/2021      Component Value Date/Time   NA 141 06/22/2021 1613   NA 140 09/01/2014 1600   K 5.0 06/22/2021 1613   K 4.3 09/01/2014 1600   CL 105 06/22/2021 1613   CL 111 (H) 09/01/2014 1600   CO2 22 06/22/2021 1613   CO2 18 (L) 09/01/2014 1600   GLUCOSE 94 06/22/2021 1613   GLUCOSE 102 (H) 12/27/2020 1703   GLUCOSE 78 09/01/2014 1600   BUN 10 06/22/2021 1613   BUN 10 09/01/2014 1600   CREATININE 0.93 06/22/2021 1613   CREATININE 0.63 01/24/2015 1449   CALCIUM 9.1 06/22/2021 1613   CALCIUM 8.3 (L) 09/01/2014 1600   PROT 7.9 06/22/2021 1613   PROT 6.8 08/16/2014 0431   ALBUMIN 4.2 06/22/2021 1613   ALBUMIN 2.7 (L) 08/16/2014 0431   AST 23 06/22/2021 1613   AST 16 08/16/2014 0431   ALT 14 06/22/2021 1613   ALT 17 08/16/2014 0431   ALKPHOS 146 (H) 06/22/2021 1613   ALKPHOS 86 08/16/2014 0431   BILITOT 0.2 06/22/2021 1613   BILITOT 0.3 08/16/2014 0431   GFRNONAA >60 12/27/2020 1703   GFRNONAA >60 09/01/2014 1600   GFRNONAA >60 07/13/2013 1013   GFRNONAA 84 08/26/2012 1520   GFRAA 93 07/28/2020 1559   GFRAA >60 09/01/2014 1600   GFRAA >60 07/13/2013 1013   GFRAA >89 08/26/2012 1520   Lab Results  Component Value Date   CHOL 206 (H)  09/09/2019   HDL 49 09/09/2019   LDLCALC 139 (H) 09/09/2019   TRIG 89 09/09/2019   CHOLHDL 4.2 09/09/2019   Lab Results  Component Value Date   HGBA1C 5.2 01/02/2021   Lab Results  Component Value Date   JSIDXFPK44 171 01/02/2021   Lab Results  Component Value Date   TSH 2.890 02/27/2021      ASSESSMENT  AND PLAN 71 y.o. year old female   1.  History of seizures, well controlled -Continue phenobarbital -In September 2022 level was 27 2.  Frequent headaches 3.  Left hand tingling -NCV/EMG was normal     Butler Denmark, [place], DNP 12/20/2021, 3:35 PM [facility] [address] [place], [place] [address] [phone]

## 2021-12-21 ENCOUNTER — Ambulatory Visit: Payer: Medicare Other | Admitting: Neurology

## 2022-01-04 ENCOUNTER — Other Ambulatory Visit: Payer: Self-pay | Admitting: Family Medicine

## 2022-02-05 ENCOUNTER — Other Ambulatory Visit: Payer: Self-pay | Admitting: Family Medicine

## 2022-02-05 DIAGNOSIS — F32A Depression, unspecified: Secondary | ICD-10-CM

## 2022-02-07 ENCOUNTER — Ambulatory Visit (INDEPENDENT_AMBULATORY_CARE_PROVIDER_SITE_OTHER): Payer: Medicare Other | Admitting: Neurology

## 2022-02-07 ENCOUNTER — Encounter: Payer: Self-pay | Admitting: Neurology

## 2022-02-07 VITALS — BP 122/82 | HR 73 | Ht 65.0 in | Wt 205.0 lb

## 2022-02-07 DIAGNOSIS — G43009 Migraine without aura, not intractable, without status migrainosus: Secondary | ICD-10-CM | POA: Diagnosis not present

## 2022-02-07 DIAGNOSIS — G40309 Generalized idiopathic epilepsy and epileptic syndromes, not intractable, without status epilepticus: Secondary | ICD-10-CM

## 2022-02-07 MED ORDER — AIMOVIG 140 MG/ML ~~LOC~~ SOAJ: 140.0000 mg | SUBCUTANEOUS | 11 refills | Status: DC

## 2022-02-07 MED ORDER — TOPIRAMATE 50 MG PO TABS: ORAL_TABLET | ORAL | 3 refills | Status: DC

## 2022-02-07 NOTE — Progress Notes (Signed)
? ? ?Patient: [name] ?Date of Birth: [birth year] ? ?Reason for Visit: Follow up ?History from: Patient ?Primary Neurologist: Dr. Jaynee Eagles  ? ?ASSESSMENT AND PLAN ?71 y.o. year old female  ? ?1.  History of seizures, well controlled ?-Last seizure was in 2013, continue phenobarbital ?-In September 2022 phenobarbital level was 27 ?-Recheck labs at next visit ? ?2.  Chronic migraine headache ?-Restart Aimovig 140 mg monthly injection for headache prevention, continue Topamax 50 mg, 2 tablets twice a day ?-In the past sleep study was negative ? ?3.  Rheumatoid arthritis ?-Encouraged to follow-up with rheumatology ? ?I will see her back in 6 months or sooner if needed, she will be followed by Dr. Jaynee Eagles since Dr. Jannifer Franklin retired. ? ?HISTORY OF PRESENT ILLNESS: ?Today 02/07/22 ?[name] here today for follow-up.  History of seizures well-controlled on phenobarbital. EMG left upper extremity October 2022 was normal.  Has daily headaches, got 1 dose of Aimovig, didn't continue it for some reason. Claims wakes with morning headache, sleep study with Dr. Brett Fairy in 2018 was normal. Sharp pain to right temporal area, is brief, few seconds, then gone, occasionally at night before bed. Her legs hurt from RA. Her rhematologist is in [facility], she doesn't drive, due for appointment. Her brother takes her to appointment. No seizures. Last seizure was 2013.  ? ?HISTORY ?06/22/2021 Dr. Jannifer Franklin: Ms. Eroh is a 71 year old right-handed black female with a history of rheumatoid arthritis, cerebrovascular disease, headache, and a history of seizures.  She has been well controlled with her seizures on phenobarbital.  She has had ongoing daily headaches with sharp shooting pains in the temporal regions.  She was placed on Aimovig but never started this.  Roselyn Meier was not approved through the insurance.  She claims that she has stopped the [facility] powders.  Within the last 3 weeks, she had spontaneous onset of right heel and ankle pain,  worse with weightbearing.  X-rays have been unrevealing.  She reports that there is some slight swelling of the right foot and ankle, and the ankle feels warm.  She reports that she feels weak all over.  She also reports some tingling sensations in the morning involving the left hand.  She comes back here for further evaluation.  She does report chronic low back pain and some pain down the left leg, not the right leg. ? ?REVIEW OF SYSTEMS: Out of a complete 14 system review of symptoms, the patient complains only of the following symptoms, and all other reviewed systems are negative. ? ?See HPI ? ?ALLERGIES: ?Allergies  ?Allergen Reactions  ? Amoxicillin   ?  REACTION: hives  ? Aspirin   ?  REACTION: rash  ? Codeine Phosphate   ?  REACTION: N/V  ? Flexeril [Cyclobenzaprine] Nausea And Vomiting  ?  Dizziness ?  ? Ibuprofen Hives  ? Penicillins   ?  REACTION: hives  ? Tetanus Toxoids Swelling  ? ? ?HOME MEDICATIONS: ?Outpatient Medications Prior to Visit  ?Medication Sig Dispense Refill  ? albuterol (VENTOLIN HFA) 108 (90 Base) MCG/ACT inhaler Inhale 1-2 puffs into the lungs every 6 (six) hours as needed for wheezing or shortness of breath. 1 each 0  ? alendronate (FOSAMAX) 70 MG tablet TAKE 1 TABLET BY MOUTH ONCE WEEKLY 12 tablet 1  ? aspirin EC 81 MG EC tablet Take 1 tablet (81 mg total) by mouth daily. 30 tablet 0  ? atorvastatin (LIPITOR) 80 MG tablet TAKE 1 TABLET BY MOUTH ONCE DAILY AT 6PM 30  tablet 4  ? carvedilol (COREG) 3.125 MG tablet TAKE 1 TABLET BY MOUTH TWICE DAILY WITH A MEAL 180 tablet 1  ? citalopram (CELEXA) 20 MG tablet TAKE 1 TABLET BY MOUTH ONCE DAILY 30 tablet 2  ? clotrimazole (LOTRIMIN) 1 % cream Apply 1 application topically 2 (two) times daily. Under breasts 30 g 1  ? folic acid (FOLVITE) 1 MG tablet TAKE 1 TABLET BY MOUTH ONCE A DAY 30 tablet 1  ? gabapentin (NEURONTIN) 300 MG capsule TAKE 3 CAPSULES BY MOUTH TWICE DAILY 180 capsule 2  ? HYDROcodone-acetaminophen (NORCO) 10-325 MG tablet  Take 1-2 tablets by mouth See admin instructions. 1 tablet in the morning and 2 tablets at night    ? omeprazole (PRILOSEC) 20 MG capsule TAKE 1 CAPSULE BY MOUTH ONCE DAILY 30 capsule 3  ? PHENObarbital (LUMINAL) 100 MG tablet Take 1 tablet (100 mg total) by mouth daily. 90 tablet 1  ? tiotropium (SPIRIVA HANDIHALER) 18 MCG inhalation capsule INHALE THE CONTENTS OF 1 CAPSULE VIA HANDIHALER BY MOUTH ONCE DAILY 30 capsule 3  ? topiramate (TOPAMAX) 50 MG tablet TAKE 2 TABLETS BY MOUTH TWICE A DAY. NEED APPT FOR FURTHER REILLS. 120 tablet 5  ? triamcinolone ointment (KENALOG) 0.1 % Apply 1 application topically 2 (two) times daily. Apply to front of your lower leg 30 g 1  ? Erenumab-aooe (AIMOVIG) 140 MG/ML SOAJ Inject 140 mg into the skin every 30 (thirty) days. 1.12 mL 4  ? azithromycin (ZITHROMAX) 250 MG tablet Take 1 tablet (250 mg total) by mouth daily. Take first 2 tablets together, then 1 every day until finished. 6 tablet 0  ? benzonatate (TESSALON) 100 MG capsule Take 1 capsule (100 mg total) by mouth 2 (two) times daily as needed for cough. 20 capsule 0  ? ?No facility-administered medications prior to visit.  ? ? ?PAST MEDICAL HISTORY: ?Past Medical History:  ?Diagnosis Date  ? After cataract 07/22/2014  ? Asthma   ? Bilateral arm pain 01/13/2015  ? Chronic low back pain   ? Collagen vascular disease ([place])   ? Conversion disorder with seizures or convulsions   ? On Phenobarbital.  ? COPD (chronic obstructive pulmonary disease) ([place])   ? Depression   ? Ear pain, bilateral 03/30/2019  ? Fibromyalgia   ? 08/18/15  [facility]  ? GERD (gastroesophageal reflux disease)   ? Headache 05/27/2013  ? History of CVA (cerebrovascular accident)   ? "11 minni srokes"  ? Hyperlipidemia   ? Hypertension   ? Migraine   ? Neck pain 02/21/2012  ? Personal history of colonic polyp - adenoma 03/01/2014  ? Personality disorder ([place])   ? Productive cough 11/09/2020  ? Rash and nonspecific skin eruption 08/04/2020  ? Rheumatoid arthritis ([place])   ?  08/18/15  [facility]  ? Seizures ([place])   ? last seizure 4 years ago  ? Sleep apnea   ? Mild. No need for CPAP.  ? Stroke [facility])   ? ? ?PAST SURGICAL HISTORY: ?Past Surgical History:  ?Procedure Laterality Date  ? ANKLE SURGERY Left   ? APPENDECTOMY    ? BACK SURGERY    ? BUNIONECTOMY    ? 2 toes  ? CARDIAC CATHETERIZATION  2008  ? Normal. Dr Acie Fredrickson.  ? CHOLECYSTECTOMY    ? CYST EXCISION    ? leg  ? ESOPHAGOGASTRODUODENOSCOPY (EGD) WITH PROPOFOL N/A 06/27/2018  ? Procedure: ESOPHAGOGASTRODUODENOSCOPY (EGD) WITH PROPOFOL;  Surgeon: Jonathon Bellows, MD;  Location: [facility] ENDOSCOPY;  Service: Gastroenterology;  Laterality: N/A;  ? FLANK MASS EXCISION    ? FOOT TENDON SURGERY  june 2014  ? both feet  ? KNEE SURGERY    ? Bilateral.  ? NOSE SURGERY    ? SHOULDER SURGERY    ? bil.  ? TOTAL ABDOMINAL HYSTERECTOMY    ? ? ?FAMILY HISTORY: ?Family History  ?Problem Relation Age of Onset  ? Cirrhosis Mother   ? Diabetes Mother   ? Cirrhosis Father   ? Breast cancer Sister 110  ? Breast cancer Sister 66  ? Colon cancer Neg Hx   ? ? ?SOCIAL HISTORY: ?Social History  ? ?Socioeconomic History  ? Marital status: Married  ?  Spouse name: Clearence   ? Number of children: 1  ? Years of education: 39  ? Highest education level: Not on file  ?Occupational History  ? Occupation: Unemployed   ?Tobacco Use  ? Smoking status: Former  ? Smokeless tobacco: Never  ?Vaping Use  ? Vaping Use: Never used  ?Substance and Sexual Activity  ? Alcohol use: No  ? Drug use: No  ? Sexual activity: Not on file  ?Other Topics Concern  ? Not on file  ?Social History Narrative  ? Patient lives at home with her husband [name].   ? Right Handed  ? Drinks no caffeine  ? ?Social Determinants of Health  ? ?Financial Resource Strain: Not on file  ?Food Insecurity: Not on file  ?Transportation Needs: Not on file  ?Physical Activity: Not on file  ?Stress: Not on file  ?Social Connections: Not on file  ?Intimate Partner Violence: Not on file  ? ? ?PHYSICAL EXAM ? ?Vitals:  ?  02/07/22 1435  ?BP: 122/82  ?Pulse: 73  ?Weight: 205 lb (93 kg)  ?Height: '5\' 5"'$  (1.651 m)  ? ?Body mass index is 34.11 kg/m?. ? ?Generalized: Well developed, in no acute distress  ?Neurological examination  ?M

## 2022-02-07 NOTE — Patient Instructions (Signed)
Get started back on the [place] for headache prevention  ?Continue the Topamax, Phenobarbital for seizures ?Call for any seizure events, see you back in 6 months  ?

## 2022-02-19 ENCOUNTER — Ambulatory Visit (INDEPENDENT_AMBULATORY_CARE_PROVIDER_SITE_OTHER): Payer: Medicare Other | Admitting: Family Medicine

## 2022-02-19 ENCOUNTER — Encounter: Payer: Self-pay | Admitting: Family Medicine

## 2022-02-19 VITALS — BP 130/78 | HR 76 | Temp 98.0°F | Wt 197.0 lb

## 2022-02-19 DIAGNOSIS — R059 Cough, unspecified: Secondary | ICD-10-CM | POA: Insufficient documentation

## 2022-02-19 DIAGNOSIS — R058 Other specified cough: Secondary | ICD-10-CM

## 2022-02-19 DIAGNOSIS — R051 Acute cough: Secondary | ICD-10-CM

## 2022-02-19 NOTE — Assessment & Plan Note (Signed)
-  likely secondary to viral etiology, no evidence of bacterial infection or pneumonia on exam ?-pending COVID and influenza testing ?-supportive care measures discussed extensively ?-strict return precautions discussed, instructed to follow up in 1 week if symptoms have not improved or worsen ?

## 2022-02-19 NOTE — Patient Instructions (Addendum)
It was great seeing you today! ? ?Today we discussed your symptoms, it seems you have a viral infection. Your COVID and flu testing are pending. Please get plenty of rest and drink adequate fluids. Honey can help your cough and a humidifier can help alleviate some of the congestion.  ? ?If you are having difficulty breathing or cannot tolerate fluids then please go to the emergency department for further evaluation.  ? ?If your symptoms do not improve by next week on 5/8, then please return to the clinic to be evaluated again.  ? ?Please follow up at your next scheduled appointment, if anything arises between now and then, please don't hesitate to contact our office. ? ? ?Thank you for allowing [place] to be a part of your medical care! ? ?Thank you, ?Dr. Larae Grooms  ?

## 2022-02-19 NOTE — Progress Notes (Signed)
    SUBJECTIVE:   CHIEF COMPLAINT / HPI:   Patient presents with sore throat, cough without phlegm, myalgias, fever (Tmax 101) and congestion that started 3 days ago. First the sore throat developed, then cough and then all the other symptoms rapidly emerged. Denies any known sick contacts. She did not have her influenza vaccine. She has received both doses of the COVID vaccine. She is unable to taste. Reports she is able to tolerate fluids.   OBJECTIVE:   BP 130/78   Pulse 76   Temp 98 F (36.7 C) (Oral)   Wt 197 lb (89.4 kg)   SpO2 99%   BMI 32.78 kg/m   General: Patient ill-appearing, in no acute distress. HEENT: presence of posterior cervical LAD noted, normal buccal mucosa without tonsillar erythema or exudate noted, PERRLA, non-bulging TM bilaterally without drainage  CV: RRR, no murmurs or gallops auscultated Resp: CTAB although moderate congestion noted, no evidence of wheezing, rales or rhonchi noted, breathing comfortably on room air Ext: no LE edema noted bilaterally  ASSESSMENT/PLAN:   Cough -likely secondary to viral etiology, no evidence of bacterial infection or pneumonia on exam -pending COVID and influenza testing -supportive care measures discussed extensively -strict return precautions discussed, instructed to follow up in 1 week if symptoms have not improved or worsen     Bensen [place]adderdon Larae Grooms, [place]

## 2022-02-21 ENCOUNTER — Encounter: Payer: Self-pay | Admitting: Family Medicine

## 2022-02-21 LAB — COVID-19, FLU A+B NAA
Influenza A, NAA: NOT DETECTED
Influenza B, NAA: NOT DETECTED
SARS-CoV-2, NAA: NOT DETECTED

## 2022-03-06 ENCOUNTER — Other Ambulatory Visit: Payer: Self-pay | Admitting: Family Medicine

## 2022-03-30 ENCOUNTER — Other Ambulatory Visit: Payer: Self-pay

## 2022-03-30 ENCOUNTER — Emergency Department
Admission: EM | Admit: 2022-03-30 | Discharge: 2022-03-30 | Disposition: A | Discharge status: Home / Self Care | Payer: Medicare Other | Attending: Emergency Medicine | Admitting: Emergency Medicine

## 2022-03-30 ENCOUNTER — Emergency Department: Payer: Medicare Other

## 2022-03-30 DIAGNOSIS — I10 Essential (primary) hypertension: Secondary | ICD-10-CM | POA: Insufficient documentation

## 2022-03-30 DIAGNOSIS — Z7982 Long term (current) use of aspirin: Secondary | ICD-10-CM | POA: Insufficient documentation

## 2022-03-30 DIAGNOSIS — Y92008 Other place in unspecified non-institutional (private) residence as the place of occurrence of the external cause: Secondary | ICD-10-CM | POA: Insufficient documentation

## 2022-03-30 DIAGNOSIS — W19XXXA Unspecified fall, initial encounter: Secondary | ICD-10-CM

## 2022-03-30 DIAGNOSIS — Z87891 Personal history of nicotine dependence: Secondary | ICD-10-CM | POA: Diagnosis not present

## 2022-03-30 DIAGNOSIS — Z79899 Other long term (current) drug therapy: Secondary | ICD-10-CM | POA: Diagnosis not present

## 2022-03-30 DIAGNOSIS — S0093XA Contusion of unspecified part of head, initial encounter: Secondary | ICD-10-CM

## 2022-03-30 DIAGNOSIS — W01198A Fall on same level from slipping, tripping and stumbling with subsequent striking against other object, initial encounter: Secondary | ICD-10-CM | POA: Diagnosis not present

## 2022-03-30 DIAGNOSIS — S8001XA Contusion of right knee, initial encounter: Secondary | ICD-10-CM | POA: Insufficient documentation

## 2022-03-30 DIAGNOSIS — M542 Cervicalgia: Secondary | ICD-10-CM | POA: Diagnosis not present

## 2022-03-30 DIAGNOSIS — S0083XA Contusion of other part of head, initial encounter: Secondary | ICD-10-CM | POA: Insufficient documentation

## 2022-03-30 DIAGNOSIS — S0003XA Contusion of scalp, initial encounter: Secondary | ICD-10-CM | POA: Insufficient documentation

## 2022-03-30 DIAGNOSIS — Y9301 Activity, walking, marching and hiking: Secondary | ICD-10-CM | POA: Insufficient documentation

## 2022-03-30 DIAGNOSIS — J45909 Unspecified asthma, uncomplicated: Secondary | ICD-10-CM | POA: Insufficient documentation

## 2022-03-30 DIAGNOSIS — J449 Chronic obstructive pulmonary disease, unspecified: Secondary | ICD-10-CM | POA: Diagnosis not present

## 2022-03-30 DIAGNOSIS — I2510 Atherosclerotic heart disease of native coronary artery without angina pectoris: Secondary | ICD-10-CM | POA: Diagnosis not present

## 2022-03-30 DIAGNOSIS — S0990XA Unspecified injury of head, initial encounter: Secondary | ICD-10-CM | POA: Diagnosis present

## 2022-03-30 DIAGNOSIS — M25561 Pain in right knee: Secondary | ICD-10-CM

## 2022-03-30 NOTE — ED Provider Notes (Signed)
[facility] Emergency Department Provider Note   ____________________________________________   Event Date/Time   First MD Initiated Contact with Patient 03/30/22 1740     (approximate)  I have reviewed the triage vital signs and the nursing notes.   HISTORY  Chief Complaint Fall    HPI [name] is a 71 y.o. female presents to the emergency room via EMS after fall today.  Patient reports that she was walking in her living room and tripped over unknown object falling forward and hitting her head on the wall.  Ports that when she hit her head she felt a "pop" in her neck.  Triage endorsed headache and dizziness however, denied loss of consciousness.  Patient was placed in c-collar by EMS.  She reports that her headache is a 4 out of 10.  She has taken nothing for the pain at this time.  At this time patient reports mild headache but denies dizziness or vision changes.  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   COPD (chronic obstructive pulmonary disease) (HCC)    Depression    Ear pain, bilateral 03/30/2019   Fibromyalgia    08/18/15  [facility]   GERD (gastroesophageal reflux disease)    Headache 05/27/2013   History of CVA (cerebrovascular accident)    "11 minni srokes"   Hyperlipidemia    Hypertension    Migraine    Neck pain 02/21/2012   Personal history of colonic polyp - adenoma 03/01/2014   Personality disorder ([place])    Productive cough 11/09/2020   Rash and nonspecific skin eruption 08/04/2020   Rheumatoid arthritis ([place])    08/18/15  [facility]   Seizures ([place])    last seizure 4 years ago   Sleep apnea    Mild. No need for CPAP.   Stroke [facility])     Patient Active Problem List   Diagnosis Date Noted   Cough 02/19/2022   Breast pain 03/28/2021    Syncope and collapse 02/28/2021   Rib pain on left side 02/28/2021   Bilateral leg pain 01/03/2021   Peripheral neuropathy 01/03/2021   Dyspnea on exertion 08/04/2020   CVA (cerebral vascular accident) ([place]) 09/09/2019   Osteopenia of neck of femur 02/05/2019   Coronary artery disease 10/16/2018   Hypersomnia with sleep apnea 04/18/2017   Post traumatic stress disorder (PTSD) 04/18/2017   COPD mixed type ([place]) 04/18/2017   Cervical paraspinal muscle spasm 10/26/2016   Lower back pain 05/27/2016   Left hip pain 05/27/2016   Fibromyalgia 08/18/2015   Rheumatoid arthritis ([place]) 03/09/2015   Essential hypertension, benign 02/18/2015   Cervical disc disorder with radiculopathy of cervical region 01/21/2015   Personal history of colonic polyp - adenoma 03/01/2014   Dyshidrotic eczema 12/29/2013   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 Dr Acie Fredrickson.   CHOLECYSTECTOMY     CYST EXCISION     leg   ESOPHAGOGASTRODUODENOSCOPY (EGD) WITH PROPOFOL N/A 06/27/2018   Procedure: ESOPHAGOGASTRODUODENOSCOPY (EGD) WITH PROPOFOL;  Surgeon: Jonathon Bellows, MD;  Location[facility] ENDOSCOPY;  Service: Gastroenterology;  Laterality: N/A;   FLANK MASS EXCISION     FOOT TENDON SURGERY  june 2014   both feet   KNEE SURGERY     Bilateral.   NOSE SURGERY     SHOULDER SURGERY     bil.   TOTAL ABDOMINAL HYSTERECTOMY      Prior to Admission medications   Medication  Sig Start Date End Date Taking? Authorizing Provider  albuterol (VENTOLIN HFA) 108 (90 Base) MCG/ACT inhaler Inhale 1-2 puffs into the lungs every 6 (six) hours as needed for wheezing or shortness of breath. 12/15/21   Hazel Sams, PA-C  alendronate (FOSAMAX) 70 MG tablet TAKE 1 TABLET BY MOUTH ONCE WEEKLY 12/06/21   Shary Key, DO  aspirin EC 81 MG EC tablet Take 1 tablet (81 mg total) by mouth daily. 09/12/19   Geradine Girt, DO  atorvastatin (LIPITOR) 80 MG tablet TAKE 1 TABLET BY MOUTH ONCE DAILY AT 6PM 12/06/21   Sonia Side J, DO  carvedilol (COREG) 3.125 MG tablet TAKE 1 TABLET BY MOUTH TWICE DAILY WITH A MEAL 09/06/21   Paige, [place] J, DO  citalopram (CELEXA) 20 MG tablet TAKE 1 TABLET BY MOUTH ONCE DAILY 02/05/22   Shary Key, DO  clotrimazole (LOTRIMIN) 1 % cream Apply 1 application topically 2 (two) times daily. Under breasts 08/03/20   Patriciaann Clan, DO  Erenumab-aooe (AIMOVIG) 140 MG/ML SOAJ Inject 140 mg into the skin every 30 (thirty) days. 02/07/22   Suzzanne Cloud, NP  folic acid (FOLVITE) 1 MG tablet TAKE 1 TABLET BY MOUTH ONCE A DAY 03/06/22   Paige, [place] J, DO  gabapentin (NEURONTIN) 300 MG capsule TAKE 3 CAPSULES BY MOUTH TWICE DAILY 12/06/21   Shary Key, DO  HYDROcodone-acetaminophen (NORCO) 10-325 MG tablet Take 1-2 tablets by mouth See admin instructions. 1 tablet in the morning and 2 tablets at night 08/28/19   [provider]  omeprazole (PRILOSEC) 20 MG capsule TAKE 1 CAPSULE BY MOUTH ONCE DAILY 12/06/21   Arby Barrette, Weldon Picking, DO  PHENObarbital (LUMINAL) 100 MG tablet Take 1 tablet (100 mg total) by mouth daily. 10/24/21   Suzzanne Cloud, NP  tiotropium (SPIRIVA HANDIHALER) 18 MCG inhalation capsule INHALE THE CONTENTS OF 1 CAPSULE VIA HANDIHALER BY MOUTH ONCE DAILY 08/03/20   Patriciaann Clan, DO  topiramate (TOPAMAX) 50 MG tablet TAKE 2 TABLETS BY MOUTH TWICE A DAY. NEED APPT FOR FURTHER REILLS. 02/07/22   Suzzanne Cloud, NP   triamcinolone ointment (KENALOG) 0.1 % Apply 1 application topically 2 (two) times daily. Apply to front of your lower leg 08/03/20   Patriciaann Clan, DO    Allergies Amoxicillin, Aspirin, Codeine phosphate, Flexeril [cyclobenzaprine], Ibuprofen, Penicillins, and Tetanus toxoids  Family History  Problem Relation Age of Onset   Cirrhosis Mother    Diabetes Mother    Cirrhosis Father    Breast cancer Sister 30   Breast cancer Sister 62   Colon cancer Neg Hx     Social History Social History   Tobacco Use   Smoking status: Former   Smokeless tobacco: Never  Vaping Use   Vaping Use: Never used  Substance Use Topics   Alcohol use: No   Drug use: No    Review of Systems  Constitutional: No fever/chills.  Denies loss of consciousness. Eyes: No visual changes. ENT: No sore throat. Cardiovascular: Denies chest pain. Respiratory: Denies shortness of breath. Gastrointestinal: No abdominal pain.  No nausea, no vomiting.  No diarrhea.  No constipation. Genitourinary: Negative for dysuria. Musculoskeletal: Positive for neck pain and right knee pain. Skin: Negative for rash. Neurological: Negative focal weakness or numbness.  Positive for headache.  Positive for dizziness.   ____________________________________________   PHYSICAL EXAM:  VITAL SIGNS: ED Triage Vitals  Enc Vitals Group     BP 03/30/22 1630 (!) 106/52     Pulse Rate 03/30/22 1630 72     Resp 03/30/22 1630 18     Temp 03/30/22 1630 97.8 F (36.6 C)     Temp Source 03/30/22 1630 Oral     SpO2 03/30/22 1630 100 %     Weight 03/30/22 1628 182 lb (82.6 kg)     Height 03/30/22 1628 '5\' 5"'$  (1.651 m)     Head Circumference --      Peak Flow --      Pain Score 03/30/22 1628 8     Pain Loc --      Pain Edu? --      Excl. in [place]? --     Constitutional: Alert and oriented. Well appearing and in no acute distress. Eyes: Conjunctivae are normal. PERRL. EOMI. Head: Patient has slight bruising to  forehead. Nose: No congestion/rhinnorhea. Mouth/Throat: Mucous membranes are moist.  Oropharynx non-erythematous. Neck: Patient is tender to palpation over entire C-spine.  Patient has full range of motion. Cardiovascular: Normal rate, regular rhythm. Grossly normal heart sounds.  Good peripheral circulation. Respiratory: Normal respiratory effort.  No retractions. Lungs CTAB. Gastrointestinal: Soft and nontender. No distention. No abdominal bruits. No CVA tenderness. Musculoskeletal: Patient has redness/bruising to right knee.  She has limited range of motion to the right knee.  However, spouse states this is normal for her and that this leg is normally "dead weight".  Patient's back shows no signs of redness/swelling and she has no pain with palpation of thoracic or lumbar spine.  Patient is noted to have pain with palpation of cervical spine. Neurologic:  Normal speech and language. No gross focal neurologic deficits are appreciated.  Patient has gait instability at baseline. Skin:  Skin is warm, dry and intact. No rash noted. Psychiatric: Mood and affect are normal. Speech and behavior are normal.  ____________________________________________   LABS (all labs ordered are listed, but only abnormal results are displayed)  Labs Reviewed - No data to display ____________________________________________  EKG   ____________________________________________  RADIOLOGY  ED MD interpretation: CT of C-spine and CT of head were reviewed by me and read by radiologist.  Official radiology report(s): CT Cervical Spine Wo Contrast  Result Date: 03/30/2022 CLINICAL DATA:  Trauma, fall EXAM: CT CERVICAL SPINE WITHOUT CONTRAST TECHNIQUE: Multidetector CT imaging of the cervical spine was performed without intravenous contrast. Multiplanar CT image reconstructions were also generated. RADIATION DOSE REDUCTION: This exam was performed according to the departmental dose-optimization program which  includes automated exposure control, adjustment of the mA and/or kV according to patient size and/or use of iterative reconstruction technique. COMPARISON:  None Available. FINDINGS: Alignment: Alignment of posterior margins of vertebral bodies is unremarkable. Skull base and vertebrae: No recent fracture is seen. Soft tissues and spinal  canal: There is no central spinal stenosis. Disc levels: There is encroachment of neural foramina at C3-C4 and C4-C5 levels. Upper chest: Unremarkable. Other: There is inhomogeneous attenuation in the thyroid. There is 9 mm low-density nodule in the right lobe. IMPRESSION: No recent fracture is seen in the cervical spine. Cervical spondylosis with encroachment of neural foramina by bony spurs at C3-C4 and C4-C5 levels. There is inhomogeneous attenuation in the thyroid. There is 9 mm low-density nodule in the right lobe of thyroid. Electronically Signed   By: Elmer Picker M.D.   On: 03/30/2022 16:55   CT HEAD WO CONTRAST (5MM)  Result Date: 03/30/2022 CLINICAL DATA:  Trauma, fall EXAM: CT HEAD WITHOUT CONTRAST TECHNIQUE: Contiguous axial images were obtained from the base of the skull through the vertex without intravenous contrast. RADIATION DOSE REDUCTION: This exam was performed according to the departmental dose-optimization program which includes automated exposure control, adjustment of the mA and/or kV according to patient size and/or use of iterative reconstruction technique. COMPARISON:  03/07/2021 FINDINGS: Brain: No acute intracranial findings are seen. There are no signs of bleeding within the cranium. Ventricles are unremarkable. There is 1 cm fluid density structure right basal ganglia. There is 4 mm low-density in the left insula. These findings appear stable. There is no focal mass effect. Vascular: Scattered arterial calcifications are seen. Skull: No fracture is seen in the calvarium. There is subcutaneous hematoma in the left frontal scalp.  Sinuses/Orbits: There is mucous retention cyst in the left maxillary sinus. There is mild mucosal thickening in the ethmoid sinus. Frontal sinus is hypoplastic. Other: No significant interval changes are noted. IMPRESSION: No acute intracranial findings are seen in noncontrast CT brain. There is subcutaneous hematoma in the left frontal scalp. No fracture is seen in the calvarium. Electronically Signed   By: Elmer Picker M.D.   On: 03/30/2022 16:52    ____________________________________________   PROCEDURES  Procedure(s) performed: None  Procedures  Critical Care performed: No  ____________________________________________   INITIAL IMPRESSION / ASSESSMENT AND PLAN / ED COURSE     [name] is a 71 y.o. female presents to the emergency room via EMS after fall today.  Patient reports that she was walking in her living room and tripped over unknown object falling forward and hitting her head on the wall.  Ports that when she hit her head she felt a "pop" in her neck.  Triage endorsed headache and dizziness however, denied loss of consciousness.  Patient was placed in c-collar by EMS.  She reports that her headache is a 4 out of 10.  She has taken nothing for the pain at this time.  At this time patient reports mild headache but denies dizziness or vision changes.  Neurological exam is reassuring.  Musculoskeletal exam is reassuring. Patient has CT of head and CT of cervical spine ordered from triage.  See results below.  CT of head shows no acute intracranial findings.  She is noted to have subcutaneous hematoma of the left frontal scalp.  But no fractures noted. CT of cervical spine shows spondylosis and encroachment of the neural foramina by bony spurs at C3-C4 and C4-C5 levels.  This is not a acute finding therefore her PCP will address. CT of cervical spine also shows a 9 mm nodule in the thyroid.  This will also be deferred for further follow-up by PCP.  Based on findings  of CT and patient appearance she will be discharged home in stable condition at this time. She  will need to follow-up with her primary care doctor if she continues with cervical pain.  I have encouraged her to take Tylenol or ibuprofen for the discomfort. I have also discussed with her the thyroid nodule that was found incidentally on CT and that she will need to follow-up with primary care doctor for potential ultrasound/further evaluation.     ____________________________________________   FINAL CLINICAL IMPRESSION(S) / ED DIAGNOSES  Final diagnoses:  Fall, initial encounter  Contusion of head, unspecified part of head, initial encounter  Neck pain  Right knee pain, unspecified chronicity     ED Discharge Orders     None        Note:  This document was prepared using Dragon voice recognition software and may include unintentional dictation errors.     Willaim Rayas, NP 03/30/22 1805    Duffy Bruce, MD 04/02/22 [id number]

## 2022-03-30 NOTE — ED Triage Notes (Signed)
Pt states she had finished vacuuming and went to get something and tripped on something in the floor- when pt fell she hit her head on the wall and felt something pop in her neck- pt in neck brace by EMS- pt endorsed HA and dizziness- pt denies LOC- pt denies blood thinner use

## 2022-03-30 NOTE — ED Notes (Signed)
First Nurse Note: Pt to ED via [facility] from home for a fall, pt was on the ground x 1 hour. Pt lost her balance and went hip first into the wall. Pt c/o right knee pain, hematoma to the forehead, neck pain. Pt VSS, pt has c- collar on. Pt is in NAD.

## 2022-03-30 NOTE — Discharge Instructions (Addendum)
You have been seen today in the emergency room after a fall.  Your CT of the head and neck were both found to be normal for emergent/new injury.  You have been noted to have bone spurs in your cervical spine/neck.  You have also been noted to have a 9 mm thyroid nodule.  The bone spurs and thyroid nodule should be followed up with your primary care doctor as we discussed.  Please call your primary care doctor make a follow-up appointment to discuss this further. For the pain that you feel in your right knee and neck please take Tylenol or ibuprofen for the discomfort. Return to the emergency room for worsening pain.

## 2022-04-07 ENCOUNTER — Other Ambulatory Visit: Payer: Self-pay | Admitting: Family Medicine

## 2022-04-07 DIAGNOSIS — F32A Depression, unspecified: Secondary | ICD-10-CM

## 2022-04-07 DIAGNOSIS — I10 Essential (primary) hypertension: Secondary | ICD-10-CM

## 2022-04-18 ENCOUNTER — Other Ambulatory Visit: Payer: Self-pay | Admitting: Family Medicine

## 2022-04-26 ENCOUNTER — Other Ambulatory Visit: Payer: Self-pay | Admitting: Neurology

## 2022-04-26 NOTE — Telephone Encounter (Signed)
Verify Drug Registry For Phenobarbital 100 Mg Tablet Last Filled: 01/24/2022 Quantity: 90 tablets for 90 days Last appointment: 02/07/2022 Next appointment: 08/09/2022

## 2022-06-02 ENCOUNTER — Other Ambulatory Visit: Payer: Self-pay | Admitting: Family Medicine

## 2022-06-02 DIAGNOSIS — G8929 Other chronic pain: Secondary | ICD-10-CM

## 2022-06-04 ENCOUNTER — Other Ambulatory Visit: Payer: Self-pay | Admitting: Family Medicine

## 2022-08-06 ENCOUNTER — Other Ambulatory Visit: Payer: Self-pay | Admitting: Family Medicine

## 2022-08-06 DIAGNOSIS — F32A Depression, unspecified: Secondary | ICD-10-CM

## 2022-08-09 ENCOUNTER — Ambulatory Visit: Payer: Medicare Other | Admitting: Neurology

## 2022-08-15 ENCOUNTER — Telehealth: Payer: Self-pay | Admitting: Neurology

## 2022-08-15 ENCOUNTER — Ambulatory Visit: Payer: Medicare Other | Admitting: Neurology

## 2022-08-15 NOTE — Telephone Encounter (Signed)
Pt cancelled appt due to pt's daughter in the hospital and do not know if she is goingto  live.

## 2022-08-15 NOTE — Progress Notes (Deleted)
Patient: [name] Date of Birth: 1951-10-13  Reason for Visit: Follow up History from: Patient Primary Neurologist: Dr. Jaynee Eagles   ASSESSMENT AND PLAN 72 y.o. year old female   1.  History of seizures, well controlled -Last seizure was in 2013, continue phenobarbital -In September 2022 phenobarbital level was 27 -Recheck labs at next visit  2.  Chronic migraine headache -Restart Aimovig 140 mg monthly injection for headache prevention, continue Topamax 50 mg, 2 tablets twice a day -In the past sleep study was negative  3.  Rheumatoid arthritis -Encouraged to follow-up with rheumatology  I will see her back in 6 months or sooner if needed, she will be followed by Dr. Jaynee Eagles since Dr. Jannifer Franklin retired.  HISTORY OF PRESENT ILLNESS: Today 08/15/22   Update 02/07/22 SS: [name] here today for follow-up.  History of seizures well-controlled on phenobarbital. EMG left upper extremity October 2022 was normal.  Has daily headaches, got 1 dose of Aimovig, didn't continue it for some reason. Claims wakes with morning headache, sleep study with Dr. Brett Fairy in 2018 was normal. Sharp pain to right temporal area, is brief, few seconds, then gone, occasionally at night before bed. Her legs hurt from RA. Her rhematologist is in [facility], she doesn't drive, due for appointment. Her brother takes her to appointment. No seizures. Last seizure was 2013.   HISTORY 06/22/2021 Dr. Jannifer [place]: [name] is a 71 year old right-handed black female with a history of rheumatoid arthritis, cerebrovascular disease, headache, and a history of seizures.  She has been well controlled with her seizures on phenobarbital.  She has had ongoing daily headaches with sharp shooting pains in the temporal regions.  She was placed on Aimovig but never started this.  Roselyn Meier was not approved through the insurance.  She claims that she has stopped the [facility] powders.  Within the last 3 weeks, she had spontaneous onset of  right heel and ankle pain, worse with weightbearing.  X-rays have been unrevealing.  She reports that there is some slight swelling of the right foot and ankle, and the ankle feels warm.  She reports that she feels weak all over.  She also reports some tingling sensations in the morning involving the left hand.  She comes back here for further evaluation.  She does report chronic low back pain and some pain down the left leg, not the right leg.  REVIEW OF SYSTEMS: Out of a complete 14 system review of symptoms, the patient complains only of the following symptoms, and all other reviewed systems are negative.  See HPI  ALLERGIES: Allergies  Allergen Reactions   Amoxicillin     REACTION: hives   Aspirin     REACTION: rash   Codeine Phosphate     REACTION: N/V   Flexeril [Cyclobenzaprine] Nausea And Vomiting    Dizziness    Ibuprofen Hives   Penicillins     REACTION: hives   Tetanus Toxoids Swelling    HOME MEDICATIONS: Outpatient Medications Prior to Visit  Medication Sig Dispense Refill   albuterol (VENTOLIN HFA) 108 (90 Base) MCG/ACT inhaler Inhale 1-2 puffs into the lungs every 6 (six) hours as needed for wheezing or shortness of breath. 1 each 0   alendronate (FOSAMAX) 70 MG tablet TAKE 1 TABLET BY MOUTH ONCE WEEKLY 12 tablet 1   aspirin EC 81 MG EC tablet Take 1 tablet (81 mg total) by mouth daily. 30 tablet 0   atorvastatin (LIPITOR) 80 MG tablet TAKE 1 TABLET BY MOUTH  ONCE DAILY AT 6PM 30 tablet 4   carvedilol (COREG) 3.125 MG tablet TAKE 1 TABLET BY MOUTH TWICE DAILY WITH A MEAL 180 tablet 1   citalopram (CELEXA) 20 MG tablet TAKE 1 TABLET BY MOUTH ONCE DAILY 30 tablet 2   clotrimazole (LOTRIMIN) 1 % cream Apply 1 application topically 2 (two) times daily. Under breasts 30 g 1   Erenumab-aooe (AIMOVIG) 140 MG/ML SOAJ Inject 140 mg into the skin every 30 (thirty) days. 9.98 mL 11   folic acid (FOLVITE) 1 MG tablet TAKE 1 TABLET BY MOUTH ONCE A DAY 30 tablet 1   gabapentin  (NEURONTIN) 300 MG capsule TAKE 3 CAPSULES BY MOUTH TWICE DAILY 180 capsule 2   HYDROcodone-acetaminophen (NORCO) 10-325 MG tablet Take 1-2 tablets by mouth See admin instructions. 1 tablet in the morning and 2 tablets at night     omeprazole (PRILOSEC) 20 MG capsule TAKE 1 CAPSULE BY MOUTH ONCE DAILY 30 capsule 3   PHENObarbital (LUMINAL) 100 MG tablet TAKE 1 TABLET BY MOUTH ONCE A DAY 90 tablet 1   tiotropium (SPIRIVA HANDIHALER) 18 MCG inhalation capsule INHALE THE CONTENTS OF 1 CAPSULE VIA HANDIHALER BY MOUTH ONCE DAILY 30 capsule 3   topiramate (TOPAMAX) 50 MG tablet TAKE 2 TABLETS BY MOUTH TWICE A DAY. NEED APPT FOR FURTHER REILLS. 360 tablet 3   triamcinolone ointment (KENALOG) 0.1 % Apply 1 application topically 2 (two) times daily. Apply to front of your lower leg 30 g 1   No facility-administered medications prior to visit.    PAST MEDICAL HISTORY: Past Medical History:  Diagnosis Date   After cataract 07/22/2014   Asthma    Bilateral arm pain 01/13/2015   Chronic low back pain    Collagen vascular disease ([place])    Conversion disorder with seizures or convulsions    On Phenobarbital.   COPD (chronic obstructive pulmonary disease) (HCC)    Depression    Ear pain, bilateral 03/30/2019   Fibromyalgia    08/18/15  [facility]   GERD (gastroesophageal reflux disease)    Headache 05/27/2013   History of CVA (cerebrovascular accident)    "11 minni srokes"   Hyperlipidemia    Hypertension    Migraine    Neck pain 02/21/2012   Personal history of colonic polyp - adenoma 03/01/2014   Personality disorder ([place])    Productive cough 11/09/2020   Rash and nonspecific skin eruption 08/04/2020   Rheumatoid arthritis ([place])    08/18/15  [facility]   Seizures ([place])    last seizure 4 years ago   Sleep apnea    Mild. No need for CPAP.   Stroke [facility])     PAST SURGICAL HISTORY: Past Surgical History:  Procedure Laterality Date   ANKLE SURGERY Left    APPENDECTOMY     BACK SURGERY     BUNIONECTOMY      2 toes   CARDIAC CATHETERIZATION  2008   Normal. Dr Acie Fredrickson.   CHOLECYSTECTOMY     CYST EXCISION     leg   ESOPHAGOGASTRODUODENOSCOPY (EGD) WITH PROPOFOL N/A 06/27/2018   Procedure: ESOPHAGOGASTRODUODENOSCOPY (EGD) WITH PROPOFOL;  Surgeon: Jonathon Bellows, MD;  Location: [facility] ENDOSCOPY;  Service: Gastroenterology;  Laterality: N/A;   FLANK MASS EXCISION     FOOT TENDON SURGERY  june 2014   both feet   KNEE SURGERY     Bilateral.   NOSE SURGERY     SHOULDER SURGERY     bil.   TOTAL ABDOMINAL HYSTERECTOMY  FAMILY HISTORY: Family History  Problem Relation Age of Onset   Cirrhosis Mother    Diabetes Mother    Cirrhosis Father    Breast cancer Sister 28   Breast cancer Sister 76   Colon cancer Neg Hx     SOCIAL HISTORY: Social History   Socioeconomic History   Marital status: Married    Spouse name: Clearence    Number of children: 1   Years of education: 11   Highest education level: Not on file  Occupational History   Occupation: Unemployed   Tobacco Use   Smoking status: Former   Smokeless tobacco: Never  Scientific laboratory technician Use: Never used  Substance and Sexual Activity   Alcohol use: No   Drug use: No   Sexual activity: Not on file  Other Topics Concern   Not on file  Social History Narrative   Patient lives at home with her husband [name].    Right Handed   Drinks no caffeine   Social Determinants of Radio broadcast assistant Strain: Not on file  Food Insecurity: Not on file  Transportation Needs: Not on file  Physical Activity: Not on file  Stress: Not on file  Social Connections: Not on file  Intimate Partner Violence: Not on file    PHYSICAL EXAM  There were no vitals filed for this visit.  There is no height or weight on file to calculate BMI.  Generalized: Well developed, in no acute distress  Neurological examination  Mentation: Alert oriented to time, place, history taking. Follows all commands speech and language fluent Cranial  nerve II-XII: Pupils were equal round reactive to light. Extraocular movements were full, visual field were full on confrontational test. Facial sensation and strength were normal. Head turning and shoulder shrug  were normal and symmetric. Motor:  good strength all extremities, but lacks effort Sensory: Sensory testing is intact to soft touch on all 4 extremities. No evidence of extinction is noted.  Coordination: Cerebellar testing reveals good finger-nose-finger but has mild tremor bilaterally, trouble performing heel to shin with lifting the legs  Gait and station: Has to push off from seated position to stand, gait is wide-based, antalgic, cautious, no assistive device, forgot her cane Reflexes: Deep tendon reflexes are symmetric and normal bilaterally.   DIAGNOSTIC DATA (LABS, IMAGING, TESTING) - I reviewed patient records, labs, notes, testing and imaging myself where available.  Lab Results  Component Value Date   WBC 6.1 06/22/2021   HGB 11.3 06/22/2021   HCT 37.0 06/22/2021   MCV 83 06/22/2021   PLT 199 06/22/2021      Component Value Date/Time   NA 141 06/22/2021 1613   NA 140 09/01/2014 1600   K 5.0 06/22/2021 1613   K 4.3 09/01/2014 1600   CL 105 06/22/2021 1613   CL 111 (H) 09/01/2014 1600   CO2 22 06/22/2021 1613   CO2 18 (L) 09/01/2014 1600   GLUCOSE 94 06/22/2021 1613   GLUCOSE 102 (H) 12/27/2020 1703   GLUCOSE 78 09/01/2014 1600   BUN 10 06/22/2021 1613   BUN 10 09/01/2014 1600   CREATININE 0.93 06/22/2021 1613   CREATININE 0.63 01/24/2015 1449   CALCIUM 9.1 06/22/2021 1613   CALCIUM 8.3 (L) 09/01/2014 1600   PROT 7.9 06/22/2021 1613   PROT 6.8 08/16/2014 0431   ALBUMIN 4.2 06/22/2021 1613   ALBUMIN 2.7 (L) 08/16/2014 0431   AST 23 06/22/2021 1613   AST 16 08/16/2014 0431   ALT  14 06/22/2021 1613   ALT 17 08/16/2014 0431   ALKPHOS 146 (H) 06/22/2021 1613   ALKPHOS 86 08/16/2014 0431   BILITOT 0.2 06/22/2021 1613   BILITOT 0.3 08/16/2014 0431    GFRNONAA >60 12/27/2020 1703   GFRNONAA >60 09/01/2014 1600   GFRNONAA >60 07/13/2013 1013   GFRNONAA 84 08/26/2012 1520   GFRAA 93 07/28/2020 1559   GFRAA >60 09/01/2014 1600   GFRAA >60 07/13/2013 1013   GFRAA >89 08/26/2012 1520   Lab Results  Component Value Date   CHOL 206 (H) 09/09/2019   HDL 49 09/09/2019   LDLCALC 139 (H) 09/09/2019   TRIG 89 09/09/2019   CHOLHDL 4.2 09/09/2019   Lab Results  Component Value Date   HGBA1C 5.2 01/02/2021   Lab Results  Component Value Date   VITAMINB12 483 01/02/2021   Lab Results  Component Value Date   TSH 2.890 02/27/2021    Butler Denmark, AGNP-C, DNP 08/15/2022, 5:56 AM [facility] [address], [place], [place] [address] [phone]

## 2022-10-01 ENCOUNTER — Other Ambulatory Visit: Payer: Self-pay | Admitting: Family Medicine

## 2022-10-08 ENCOUNTER — Other Ambulatory Visit: Payer: Self-pay | Admitting: Neurology

## 2022-10-18 ENCOUNTER — Other Ambulatory Visit: Payer: Self-pay | Admitting: Family Medicine

## 2022-10-18 DIAGNOSIS — I10 Essential (primary) hypertension: Secondary | ICD-10-CM

## 2022-10-29 ENCOUNTER — Other Ambulatory Visit: Payer: Self-pay | Admitting: Neurology

## 2022-10-29 ENCOUNTER — Other Ambulatory Visit: Payer: Self-pay

## 2022-11-05 ENCOUNTER — Other Ambulatory Visit: Payer: Self-pay | Admitting: Family Medicine

## 2022-11-05 DIAGNOSIS — F32A Depression, unspecified: Secondary | ICD-10-CM

## 2022-11-16 ENCOUNTER — Other Ambulatory Visit: Payer: Self-pay | Admitting: Family Medicine

## 2022-11-16 DIAGNOSIS — I10 Essential (primary) hypertension: Secondary | ICD-10-CM

## 2022-11-30 ENCOUNTER — Other Ambulatory Visit: Payer: Self-pay | Admitting: Family Medicine

## 2022-12-13 ENCOUNTER — Other Ambulatory Visit: Payer: Self-pay

## 2022-12-14 MED ORDER — FOLIC ACID 1 MG PO TABS: 1.0000 mg | ORAL_TABLET | Freq: Every day | ORAL | 1 refills | Status: DC

## 2022-12-20 ENCOUNTER — Other Ambulatory Visit: Payer: Self-pay | Admitting: *Deleted

## 2022-12-20 DIAGNOSIS — I10 Essential (primary) hypertension: Secondary | ICD-10-CM

## 2022-12-20 NOTE — Telephone Encounter (Signed)
Verbal order given to pharmacy for Folic Acid sent in on 0000000.  #30 RF:1

## 2022-12-28 ENCOUNTER — Encounter: Payer: Self-pay | Admitting: Family Medicine

## 2022-12-28 ENCOUNTER — Other Ambulatory Visit: Payer: Self-pay

## 2022-12-28 ENCOUNTER — Ambulatory Visit (INDEPENDENT_AMBULATORY_CARE_PROVIDER_SITE_OTHER): Payer: Medicare Other | Admitting: Family Medicine

## 2022-12-28 VITALS — BP 139/79 | HR 76 | Ht 65.0 in | Wt 207.0 lb

## 2022-12-28 DIAGNOSIS — Z1231 Encounter for screening mammogram for malignant neoplasm of breast: Secondary | ICD-10-CM | POA: Diagnosis not present

## 2022-12-28 DIAGNOSIS — I10 Essential (primary) hypertension: Secondary | ICD-10-CM | POA: Diagnosis not present

## 2022-12-28 DIAGNOSIS — Z23 Encounter for immunization: Secondary | ICD-10-CM | POA: Diagnosis not present

## 2022-12-28 DIAGNOSIS — E041 Nontoxic single thyroid nodule: Secondary | ICD-10-CM | POA: Diagnosis not present

## 2022-12-28 DIAGNOSIS — R21 Rash and other nonspecific skin eruption: Secondary | ICD-10-CM | POA: Diagnosis not present

## 2022-12-28 DIAGNOSIS — M05742 Rheumatoid arthritis with rheumatoid factor of left hand without organ or systems involvement: Secondary | ICD-10-CM | POA: Diagnosis not present

## 2022-12-28 DIAGNOSIS — F418 Other specified anxiety disorders: Secondary | ICD-10-CM

## 2022-12-28 MED ORDER — CITALOPRAM HYDROBROMIDE 40 MG PO TABS: 40.0000 mg | ORAL_TABLET | Freq: Every day | ORAL | 3 refills | Status: DC

## 2022-12-28 MED ORDER — CARVEDILOL 3.125 MG PO TABS: 3.1250 mg | ORAL_TABLET | Freq: Two times a day (BID) | ORAL | 0 refills | Status: DC

## 2022-12-28 MED ORDER — ALBUTEROL SULFATE HFA 108 (90 BASE) MCG/ACT IN AERS: 1.0000 | INHALATION_SPRAY | Freq: Four times a day (QID) | RESPIRATORY_TRACT | 0 refills | Status: DC | PRN

## 2022-12-28 MED ORDER — TRIAMCINOLONE ACETONIDE 0.1 % EX OINT: 1.0000 | TOPICAL_OINTMENT | Freq: Two times a day (BID) | CUTANEOUS | 1 refills | Status: DC

## 2022-12-28 NOTE — Progress Notes (Unsigned)
    SUBJECTIVE:   CHIEF COMPLAINT / HPI:   Patient presents for mood and medication refills.   States she has been having a hard time lately since she lost her daugther in 09-14-23 from CHF.  States she is not doing well mentally or with her health. Cries a lot. Lack of interest in doing things Sleeping ok. Not eating well, doesn't have an appetite. Denies SI/HI.   Had a fall and went to the ED 6/9 Had thyroid nodule found on CT that needs follow up   Requests refill of Triamcinolone and albuterol   Having worsening hand pain  Takes Norco 2 tablets at night   Not sure if she took BP med today. Will bring all meds to follow up appt   PERTINENT  PMH / PSH: Reviewed  OBJECTIVE:   BP 139/79   Pulse 76   Ht 5\' 5"  (1.651 m)   Wt 207 lb (93.9 kg)   SpO2 99%   BMI 34.45 kg/m    General: alert, tearful, NAD CV: RRR no murmurs Resp: CTAB normal WOB GI: soft, non distended Psych: mood depressed. Tearful throughout exam. Speech non tangential. Judgement normal  MSK hand:  L Hand: Inspection: swelling of MCP and PIP joints. No erythema or bruising Palpation: TTP along MCP joint ROM: Decreased ROM of the digits and wrist. Unable to fully extend and flex finger. Strength: 4/5 strength  Neurovascular: NV intact      ASSESSMENT/PLAN:   Depression with anxiety PHQ9 score 6. Denies SI/HI. Has been having a difficult time coping since the death of her only child in 2023-09-14. During exam patient crying. Currently on Celexa 20mg  and will increase to 40mg  daily. Will refer to CCM for assistance with setting patient up with grief counseling   Rheumatoid arthritis [facility]) Patient with significant swelling and tenderness of MCP and DIP joints. Work up in the past has been positive for rheumatoid arthritis. She is not taking medication. Per chart review looks like she was managed by [facility] in the past  and was previously on Methotrexate though I think it was years ago. Will place another  referral.   Thyroid nodule Incidental finding on CT at the hospital showing 38mm low density nodule in right lobe of thyroid  - thyroid US ordered     Health maintenance  - Received Flu shot - screening mammogram   Misc  Refilled Coreg, Albuterol, Triamcinolone   [place]

## 2022-12-28 NOTE — Patient Instructions (Addendum)
It was great seeing you today!  Im sorry you have not been feeling well  We have scheduled your thyroid ultrasound  I have increased your Celexa to '40mg'$  daily. You can take 2 of what you already have until you run out. I am also referring you to our chronic care team who can help get you set up with grief counseling.   Please bring all of your medications to our follow up appointment.   Please check-out at the front desk before leaving the clinic. I'd like to see you back in 2 weeks for follow up, but if you need to be seen earlier than that for any new issues we're happy to fit you in, just give [place] a call!   Feel free to call with any questions or concerns at any time, at [phone].   Take care,  Dr. Shary Key [facility]

## 2022-12-31 DIAGNOSIS — E041 Nontoxic single thyroid nodule: Secondary | ICD-10-CM | POA: Insufficient documentation

## 2022-12-31 HISTORY — DX: Nontoxic single thyroid nodule: E04.1

## 2022-12-31 NOTE — Assessment & Plan Note (Signed)
PHQ9 score 6. Denies SI/HI. Has been having a difficult time coping since the death of her only child in 09-12-2023. During exam patient crying. Currently on Celexa '20mg'$  and will increase to '40mg'$  daily. Will refer to CCM for assistance with setting patient up with grief counseling

## 2022-12-31 NOTE — Assessment & Plan Note (Signed)
Patient with significant swelling and tenderness of MCP and DIP joints. Work up in the past has been positive for rheumatoid arthritis. She is not taking medication. Per chart review looks like she was managed by [facility] in the past  and was previously on Methotrexate though I think it was years ago. Will place another referral.

## 2022-12-31 NOTE — Assessment & Plan Note (Signed)
Incidental finding on CT at the hospital showing 30m low density nodule in right lobe of thyroid  - thyroid [place]

## 2023-01-01 ENCOUNTER — Telehealth: Payer: Self-pay | Admitting: *Deleted

## 2023-01-01 NOTE — Progress Notes (Signed)
  Care Coordination   Note   01/01/2023 Name: Tanina Barb MRN: [id number] DOB: [birth year]  Lynnetta Tom is a 72 y.o. year old female who sees Shary Key, DO for primary care. I reached out to [facility] by phone today in regards to a referral.   Follow up plan:  Telephone appointment with care coordination team member scheduled for:  01/02/23  Encounter Outcome:  Pt. Scheduled  [place]  Direct Dial: [phone]

## 2023-01-02 ENCOUNTER — Ambulatory Visit: Payer: Self-pay | Admitting: *Deleted

## 2023-01-02 NOTE — Patient Instructions (Signed)
Visit Information  Thank you for taking time to visit with me today. Please don't hesitate to contact me if I can be of assistance to you.   Following are the goals we discussed today:   Goals Addressed   None     Our next appointment is by telephone on 01/11/23  Please call the care guide team at [phone] if you need to cancel or reschedule your appointment.   If you are experiencing a Mental Health or [place] or need someone to talk to, please call the Suicide and Crisis Lifeline: 988 call 911   The patient verbalized understanding of instructions, educational materials, and care plan provided today and DECLINED offer to receive copy of patient instructions, educational materials, and care plan.   Telephone follow up appointment with care management team member scheduled for:01/11/23  Eduard Clos, MSW, [place] Worker [employer] [phone]

## 2023-01-02 NOTE — Patient Outreach (Signed)
  Care Coordination   Initial Visit Note   01/02/2023 Name: [name] MRN: [id number] DOB: 1951-06-26  [name] is a 72 y.o. year old female who sees Shary Key, DO for primary care. I spoke with  [name] by phone today.  What matters to the patients health and wellness today?  "I'm a mess"    Goals Addressed   Connect you with counseling/support for grief and loss.  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!]  Depression Interventions/Treatment  Medication, Counseling  Financial Strain Interventions Intervention Not Indicated       Depression score :     01/02/2023    3:42 PM 12/28/2022    2:08 PM 02/19/2022    3:43 PM 03/27/2021   10:17 AM 02/27/2021   12:23 PM  Depression screen PHQ 2/9  Decreased Interest 3 3 0 0 0  Down, Depressed, Hopeless 3 3 0 0 1  PHQ - 2 Score 6 6 0 0 1  Altered sleeping 0 0 0 0 0  Tired, decreased energy 1 0 0 2 1  Change in appetite 1 0 0 2 1  Feeling bad or failure about yourself  0 0 0 0 1  Trouble concentrating 0 0 0 1 1  Moving slowly or fidgety/restless 0 0 0 0 0  Suicidal thoughts 0 0 0 0 0  PHQ-9 Score 8 6 0 5 5  Difficult doing work/chores Not difficult at all         Care Coordination Interventions:  Yes, provided  Interventions Today    Flowsheet Row Most Recent Value  Chronic Disease   Chronic disease during today's visit Other  [depression]  General Interventions   General Interventions Discussed/Reviewed [place] free grief counseling programs]  Mental Health Interventions   Mental Health Discussed/Reviewed Grief and  Loss, Mental Health Discussed, Mental Health Reviewed, Coping Strategies, Depression        Follow up plan: Follow up call scheduled for 01/11/23    Encounter Outcome:  Pt. Visit Completed

## 2023-01-03 ENCOUNTER — Ambulatory Visit (HOSPITAL_COMMUNITY): Payer: Medicare Other

## 2023-01-11 ENCOUNTER — Ambulatory Visit (INDEPENDENT_AMBULATORY_CARE_PROVIDER_SITE_OTHER): Payer: Medicare Other | Admitting: Family Medicine

## 2023-01-11 ENCOUNTER — Encounter: Payer: Self-pay | Admitting: Family Medicine

## 2023-01-11 ENCOUNTER — Ambulatory Visit: Payer: Self-pay | Admitting: *Deleted

## 2023-01-11 VITALS — BP 140/80 | HR 54 | Ht 65.0 in | Wt 209.6 lb

## 2023-01-11 DIAGNOSIS — M05742 Rheumatoid arthritis with rheumatoid factor of left hand without organ or systems involvement: Secondary | ICD-10-CM

## 2023-01-11 DIAGNOSIS — F418 Other specified anxiety disorders: Secondary | ICD-10-CM

## 2023-01-11 DIAGNOSIS — M069 Rheumatoid arthritis, unspecified: Secondary | ICD-10-CM

## 2023-01-11 DIAGNOSIS — E041 Nontoxic single thyroid nodule: Secondary | ICD-10-CM

## 2023-01-11 NOTE — Progress Notes (Unsigned)
    SUBJECTIVE:   CHIEF COMPLAINT / HPI:   Was referred to social work who gave her resources for grief counseling.  She did let them know that she was having increased nightmares and possibly related to the increased dose of her Cymbalta. States she has just had some interrmittent nightmares. Would like tocontniue on increase  Was provided with grief counsling resournces   Patient would like a referral to a closer rheumatologist   Has not done thyroid ultrasound, checking on appointment   PERTINENT  PMH / PSH: ***  OBJECTIVE:   BP (!) 140/80   Pulse (!) 54   Ht 5\' 5"  (1.651 m)   Wt 209 lb 9.6 oz (95.1 kg)   SpO2 100%   BMI 34.88 kg/m    Physical exam General: well appearing, NAD Cardiovascular: RRR, no murmurs Lungs: CTAB. Normal WOB Abdomen: soft, non-distended, non-tender Skin: warm, dry. No edema  ASSESSMENT/PLAN:   No problem-specific Assessment & Plan notes found for this encounter.     [place]

## 2023-01-11 NOTE — Patient Instructions (Addendum)
It was great seeing you today!  For your hand please contact [facility] Address: [address], [place], [place] [address] Phone: [phone]   For your thyroid ultrasound please call [phone].  You can continue taking the Celexa, but if you continue to have frequent nightmares you can decrease down to the 20 mg daily instead of the 40.  Feel free to call the clinic with any questions or concerns at any time, at [phone].   Take care,  Dr. Shary Key [facility], [facility]

## 2023-01-11 NOTE — Patient Outreach (Addendum)
  Care Coordination   Follow Up Visit Note   01/11/2023 Name: [name] MRN: [id number] DOB: 1951/03/25  [name] is a 72 y.o. year old female who sees Paula Key, DO for primary care. I spoke with  [name] by phone today.  What matters to the patients health and wellness today?  Swollen arm- going to see PCP today.    Goals Addressed   None     SDOH assessments and interventions completed:  Yes     Care Coordination Interventions:  Yes, provided  Interventions Today    Flowsheet Row Most Recent Value  Chronic Disease   Chronic disease during today's visit Other  [grief/loss]  General Interventions   General Interventions Discussed/Reviewed Doctor Visits, [employer], Communication with  [Pt to see PCP today- hand/arm swellling-will discuss then]  Doctor Visits Discussed/Reviewed Doctor Visits Discussed  [place] Discussed/Reviewed --  [Pt reports increased dose of Celexa may be causing nightmares- CSW suggests she discuss with PCP today at visit]       Follow up plan: Follow up call scheduled for 01/15/23    Encounter Outcome:  Pt. Visit Completed

## 2023-01-11 NOTE — Patient Instructions (Signed)
Visit Information  Thank you for taking time to visit with me today. Please don't hesitate to contact me if I can be of assistance to you.   Following are the goals we discussed today:   Goals Addressed   None     Our next appointment is by telephone on  01/15/23    Please call the care guide team at [phone] if you need to cancel or reschedule your appointment.   If you are experiencing a Mental Health or [place] or need someone to talk to, please call the Suicide and Crisis Lifeline: 988 call 911   The patient verbalized understanding of instructions, educational materials, and care plan provided today and DECLINED offer to receive copy of patient instructions, educational materials, and care plan.   Telephone follow up appointment with care management team member scheduled for:  01/15/23  Eduard Clos, MSW, [place] Worker [employer] [phone]

## 2023-01-13 NOTE — Assessment & Plan Note (Signed)
Patient referred to [facility] since she followed with them before but requests closer physician. Discussed with Jazmin, referral coordinator during visit and she is referring to [facility]. Provided patient with their contact information. Hoping she will be seen soon for evaluation as her hand is really bothering her.

## 2023-01-13 NOTE — Assessment & Plan Note (Signed)
PHQ9 2, improved from 8 a couple of weeks ago when Cymbalta was increased from 20mg  to 40mg . Tolerating well though with increased nightmares. Discussed if she continues to have persistent nightmares that are bothering her she can go back down to 20mg . For now would like to continue at the higher dose. Grief counseling resources provided by SW.

## 2023-01-13 NOTE — Assessment & Plan Note (Signed)
Ultrasound previously scheduled but appears patient cancelled it. Provided phone number to call and reschedule. Will also need TSH checked at follow up.

## 2023-01-14 ENCOUNTER — Other Ambulatory Visit: Payer: Self-pay | Admitting: Family Medicine

## 2023-01-14 ENCOUNTER — Other Ambulatory Visit: Payer: Self-pay | Admitting: Neurology

## 2023-01-15 ENCOUNTER — Ambulatory Visit: Payer: Self-pay | Admitting: *Deleted

## 2023-01-15 NOTE — Patient Outreach (Signed)
  Care Coordination   Follow Up Visit Note   01/15/2023 Name: [name] MRN: [id number] DOB: 1951-08-22  [name] is a 72 y.o. year old female who sees Shary Key, DO for primary care. I spoke with  [name] by phone today.  What matters to the patients health and wellness today?  Saw PCP who reduced the RX to 20mg  to prevent nightmares.    Goals Addressed   Pt is planning to reduce the RX Celexa given nightmares had started once began taking this med.      SDOH assessments and interventions completed:  Yes     Care Coordination Interventions:  No, not indicated   Follow up plan: Follow up call scheduled for 02/15/23    Encounter Outcome:  Pt. Visit Completed

## 2023-01-17 ENCOUNTER — Other Ambulatory Visit: Payer: Self-pay | Admitting: Neurology

## 2023-01-17 NOTE — Telephone Encounter (Signed)
Last seen 02/07/22 by [facility] Needs an appointment as she was supposed to come 08/09/22. I am refusing the medication

## 2023-01-18 ENCOUNTER — Ambulatory Visit
Admission: RE | Admit: 2023-01-18 | Discharge: 2023-01-18 | Disposition: A | Discharge status: Home / Self Care | Payer: Medicare Other | Source: Ambulatory Visit | Attending: Family Medicine | Admitting: Family Medicine

## 2023-01-18 DIAGNOSIS — E041 Nontoxic single thyroid nodule: Secondary | ICD-10-CM | POA: Insufficient documentation

## 2023-01-23 ENCOUNTER — Other Ambulatory Visit: Payer: Self-pay | Admitting: Family Medicine

## 2023-02-08 ENCOUNTER — Ambulatory Visit
Admission: RE | Admit: 2023-02-08 | Discharge: 2023-02-08 | Disposition: A | Discharge status: Home / Self Care | Payer: Medicare Other | Source: Ambulatory Visit | Attending: Family Medicine | Admitting: Family Medicine

## 2023-02-08 DIAGNOSIS — Z1231 Encounter for screening mammogram for malignant neoplasm of breast: Secondary | ICD-10-CM

## 2023-02-15 ENCOUNTER — Ambulatory Visit: Payer: Self-pay | Admitting: *Deleted

## 2023-02-15 NOTE — Patient Outreach (Signed)
  Care Coordination   Follow Up Visit Note   02/15/2023 Name: Guillermina Shaft MRN: [id number] DOB: 1951-08-13  [name] is a 72 y.o. year old female who sees Cora Collum, DO for primary care. I spoke with  Elsie Lincoln by phone today.  What matters to the patients health and wellness today?  Stressed but have good support and strong faith.    Goals Addressed   None     SDOH assessments and interventions completed:  Yes     Care Coordination Interventions:  Yes, provided  Interventions Today    Flowsheet Row Most Recent Value  Chronic Disease   Chronic disease during today's visit Hypertension (HTN)  General Interventions   General Interventions Discussed/Reviewed [facility], General Interventions Discussed  Mental Health Interventions   Mental Health Discussed/Reviewed Mental Health Discussed, Coping Strategies, Grief and Loss  [stress but stable]  Safety Interventions   Safety Discussed/Reviewed Safety Discussed       Follow up plan: No further intervention required.   Encounter Outcome:  Pt. Visit Completed

## 2023-02-15 NOTE — Patient Instructions (Signed)
Visit Information  Thank you for taking time to visit with me today. Please don't hesitate to contact me if I can be of assistance to you.   Following are the goals we discussed today:   Goals Addressed   None     If you are experiencing a Mental Health or Behavioral Health Crisis or need someone to talk to, please call the Suicide and Crisis Lifeline: 988 call 911   The patient verbalized understanding of instructions, educational materials, and care plan provided today and DECLINED offer to receive copy of patient instructions, educational materials, and care plan.   No further follow up required:    Reece Levy, MSW, LCSW Clinical Social Worker [facility] [phone]

## 2023-02-21 ENCOUNTER — Other Ambulatory Visit: Payer: Self-pay | Admitting: Family Medicine

## 2023-02-21 DIAGNOSIS — I10 Essential (primary) hypertension: Secondary | ICD-10-CM

## 2023-03-15 ENCOUNTER — Other Ambulatory Visit: Payer: Self-pay | Admitting: Family Medicine

## 2023-03-21 ENCOUNTER — Other Ambulatory Visit: Payer: Self-pay | Admitting: Family Medicine

## 2023-03-21 DIAGNOSIS — I10 Essential (primary) hypertension: Secondary | ICD-10-CM

## 2023-03-26 ENCOUNTER — Other Ambulatory Visit: Payer: Self-pay | Admitting: Neurology

## 2023-04-05 ENCOUNTER — Other Ambulatory Visit: Payer: Self-pay | Admitting: Family Medicine

## 2023-04-12 ENCOUNTER — Ambulatory Visit (INDEPENDENT_AMBULATORY_CARE_PROVIDER_SITE_OTHER): Payer: Medicare Other

## 2023-04-12 ENCOUNTER — Telehealth: Payer: Self-pay

## 2023-04-12 ENCOUNTER — Other Ambulatory Visit: Payer: Self-pay | Admitting: Family Medicine

## 2023-04-12 NOTE — Progress Notes (Signed)
  I attempted to contact the patient for her scheduled Virtual Telephone Annual Wellness Visit. No answer. I left a detailed message on the patient's voicemail.   

## 2023-04-12 NOTE — Patient Instructions (Signed)

## 2023-04-12 NOTE — Telephone Encounter (Signed)
The pt was scheduled today for her [employer] Visit. When I called her she didn't answer. She called back an hour later c/o of ankle pain, swelling, and difficulty standing. She denies any injury to the ankle. She said she woke up this morning with the ankle hurting and swollen. I recommend that she seek emergent care. She said she had to wait for someone to get off of work to take her. I told her that she needed to be evaluated today because her symptoms could be DVT. She verbalized understanding and said she would go.

## 2023-04-16 ENCOUNTER — Other Ambulatory Visit: Payer: Self-pay | Admitting: Family Medicine

## 2023-04-16 DIAGNOSIS — I10 Essential (primary) hypertension: Secondary | ICD-10-CM

## 2023-04-18 ENCOUNTER — Other Ambulatory Visit: Payer: Self-pay | Admitting: Family Medicine

## 2023-04-18 ENCOUNTER — Other Ambulatory Visit: Payer: Self-pay | Admitting: Neurology

## 2023-04-18 DIAGNOSIS — G8929 Other chronic pain: Secondary | ICD-10-CM

## 2023-04-18 MED ORDER — PHENOBARBITAL 100 MG PO TABS: 100.0000 mg | ORAL_TABLET | Freq: Every day | ORAL | 0 refills | Status: DC

## 2023-04-18 MED ORDER — TOPIRAMATE 50 MG PO TABS: 100.0000 mg | ORAL_TABLET | Freq: Two times a day (BID) | ORAL | 0 refills | Status: DC

## 2023-04-18 NOTE — Telephone Encounter (Signed)
Requested Prescriptions   Pending Prescriptions Disp Refills   topiramate (TOPAMAX) 50 MG tablet 120 tablet 0    Sig: Take 2 tablets (100 mg total) by mouth 2 (two) times daily.   PHENObarbital (LUMINAL) 100 MG tablet 90 tablet 1    Sig: Take 1 tablet (100 mg total) by mouth daily.   Last seen 02/07/22, next appt scheduled on 05/28/23 Dispenses  PHENobarbital Adherence  Dispensed Days Supply Quantity Provider Pharmacy  phenobarbital 100 mg tablet 01/17/2023 90 90 tablet Glean Salvo, NP [facility] -...  phenobarbital 100 mg tablet 10/11/2022 90 90 tablet Glean Salvo, NP [facility] -...  phenobarbital 100 mg tablet 07/23/2022 90 90 tablet Glean Salvo, NP [facility] -...  phenobarbital 100 mg tablet 04/26/2022 90 90 tablet Glean Salvo, NP [facility] -...    Dispenses  Topiramate Adherence  Dispensed Days Supply Quantity Provider Pharmacy  topiramate 50 mg tablet 10/29/2022 30 120 tablet Glean Salvo, NP [facility] -...       Only needs enough to get to that 05/28/23 appt  Routing to provider to fill

## 2023-04-18 NOTE — Telephone Encounter (Signed)
Pt request refills for PHENObarbital (LUMINAL) 100 MG tablet and topiramate (TOPAMAX) 50 MG tablet send to [facility].  Former Dr. Anne Hahn pt has scheduled appt with reassigned to Dr. Lucia Gaskins on 05/28/23 at 2pm.

## 2023-05-14 ENCOUNTER — Other Ambulatory Visit: Payer: Self-pay

## 2023-05-14 DIAGNOSIS — I10 Essential (primary) hypertension: Secondary | ICD-10-CM

## 2023-05-14 MED ORDER — CARVEDILOL 3.125 MG PO TABS
3.1250 mg | ORAL_TABLET | Freq: Two times a day (BID) | ORAL | 0 refills | Status: DC
Start: 2023-05-14 — End: 2023-06-13

## 2023-05-23 ENCOUNTER — Other Ambulatory Visit: Payer: Self-pay

## 2023-05-23 MED ORDER — OMEPRAZOLE 20 MG PO CPDR: 20.0000 mg | DELAYED_RELEASE_CAPSULE | Freq: Every day | ORAL | 0 refills | Status: DC

## 2023-05-28 ENCOUNTER — Ambulatory Visit: Payer: Medicare Other

## 2023-05-28 ENCOUNTER — Encounter: Payer: Self-pay | Admitting: Neurology

## 2023-05-28 ENCOUNTER — Ambulatory Visit (INDEPENDENT_AMBULATORY_CARE_PROVIDER_SITE_OTHER): Payer: Medicare Other | Admitting: Neurology

## 2023-05-28 ENCOUNTER — Ambulatory Visit: Payer: Medicare Other | Attending: Internal Medicine | Admitting: Internal Medicine

## 2023-05-28 ENCOUNTER — Encounter: Payer: Self-pay | Admitting: Internal Medicine

## 2023-05-28 ENCOUNTER — Ambulatory Visit
Admission: RE | Admit: 2023-05-28 | Discharge: 2023-05-28 | Disposition: A | Discharge status: Home / Self Care | Payer: Medicare Other | Source: Ambulatory Visit | Attending: Internal Medicine | Admitting: Internal Medicine

## 2023-05-28 ENCOUNTER — Ambulatory Visit (INDEPENDENT_AMBULATORY_CARE_PROVIDER_SITE_OTHER): Payer: Medicare Other

## 2023-05-28 VITALS — BP 133/81 | HR 57 | Ht 64.0 in | Wt 216.0 lb

## 2023-05-28 VITALS — BP 111/75 | HR 56 | Resp 16 | Ht 64.5 in | Wt 217.0 lb

## 2023-05-28 DIAGNOSIS — G40309 Generalized idiopathic epilepsy and epileptic syndromes, not intractable, without status epilepticus: Secondary | ICD-10-CM

## 2023-05-28 DIAGNOSIS — Z79899 Other long term (current) drug therapy: Secondary | ICD-10-CM

## 2023-05-28 DIAGNOSIS — M069 Rheumatoid arthritis, unspecified: Secondary | ICD-10-CM

## 2023-05-28 DIAGNOSIS — M79641 Pain in right hand: Secondary | ICD-10-CM | POA: Diagnosis not present

## 2023-05-28 DIAGNOSIS — E041 Nontoxic single thyroid nodule: Secondary | ICD-10-CM | POA: Diagnosis present

## 2023-05-28 DIAGNOSIS — M79642 Pain in left hand: Secondary | ICD-10-CM

## 2023-05-28 DIAGNOSIS — G43711 Chronic migraine without aura, intractable, with status migrainosus: Secondary | ICD-10-CM

## 2023-05-28 DIAGNOSIS — M797 Fibromyalgia: Secondary | ICD-10-CM | POA: Diagnosis present

## 2023-05-28 LAB — CBC WITH DIFFERENTIAL/PLATELET
Absolute Monocytes: 469 cells/uL (ref 200–950)
Basophils Absolute: 41 cells/uL (ref 0–200)
Basophils Relative: 0.6 %
Eosinophils Absolute: 286 cells/uL (ref 15–500)
Eosinophils Relative: 4.2 %
HCT: 32.9 % — ABNORMAL LOW (ref 35.0–45.0)
Hemoglobin: 10.1 g/dL — ABNORMAL LOW (ref 11.7–15.5)
Lymphs Abs: 2353 cells/uL (ref 850–3900)
MCH: 24.8 pg — ABNORMAL LOW (ref 27.0–33.0)
MCHC: 30.7 g/dL — ABNORMAL LOW (ref 32.0–36.0)
MCV: 80.6 fL (ref 80.0–100.0)
MPV: 11.1 fL (ref 7.5–12.5)
Monocytes Relative: 6.9 %
Neutro Abs: 3652 cells/uL (ref 1500–7800)
Neutrophils Relative %: 53.7 %
Platelets: 218 10*3/uL (ref 140–400)
RBC: 4.08 10*6/uL (ref 3.80–5.10)
RDW: 14.6 % (ref 11.0–15.0)
Total Lymphocyte: 34.6 %
WBC: 6.8 10*3/uL (ref 3.8–10.8)

## 2023-05-28 LAB — SEDIMENTATION RATE: Sed Rate: 34 mm/h — ABNORMAL HIGH (ref 0–30)

## 2023-05-28 MED ORDER — PREDNISONE 10 MG PO TABS
ORAL_TABLET | ORAL | 0 refills | Status: AC
Start: 2023-05-28 — End: 2023-06-07

## 2023-05-28 MED ORDER — PHENOBARBITAL 100 MG PO TABS: 100.0000 mg | ORAL_TABLET | Freq: Every day | ORAL | 4 refills | Status: DC

## 2023-05-28 NOTE — Patient Instructions (Signed)
I recommend checking out the [employer] patient-centered guide for fibromyalgia and chronic pain management: [email or web address].med.[employer].edu   

## 2023-05-28 NOTE — Progress Notes (Signed)
Patient: [name] Date of Birth: 07-Mar-1951  Reason for Visit: Follow up History from: Patient Primary Neurologist: Dr. Lucia Gaskins   ASSESSMENT AND PLAN 72 y.o. year old female   1.  History of seizures, well controlled -Last seizure was in 2013, continue phenobarbital -In September 2022 phenobarbital level was 27 -Recheck labs at next visit  2.  Chronic migraine headache -Aimovig not working. Start vyepti.  -In the past sleep study was negative  3.  Rheumatoid arthritis -Encouraged to follow-up with rheumatology  we will see her back in 6 months or sooner if needed, she will be followed by Dr. Lucia Gaskins and Margie Ege since Dr. Anne Hahn retired.  HISTORY OF PRESENT ILLNESS:  05/28/2023: She has a very bad headaches, every day, light bothersher, pulsating and pounding and throbbing, nausea, hurt to move, sleep helps, no aura, no medication overuse, lasts all day long ongoing for 2 years intractable daily migraines up to 24 hours a day; daily headaches at least 20+ migraine days a month. Also gets Tried aimovig once and gave her a bad headaches.sharp pain right temporal area every day. Constant daily shooting pain. It is in 1 spot, temporoparietal area. She is here with her family member. She has tried a plethora of medications. AImovig worsened her headaches and she does not want anymore monthly injections. We discussed options and decided on vyepti.   Patient complains of symptoms per HPI as well as the following symptoms: migraines . Pertinent negatives and positives per HPI. All others negative    Today 01/2022:  [name] here today for follow-up.  History of seizures well-controlled on phenobarbital. EMG left upper extremity October 2022 was normal.  Has daily headaches, got 1 dose of Aimovig, didn't continue it for some reason. Claims wakes with morning headache, sleep study with Dr. Vickey Huger in 2018 was normal. Sharp pain to right temporal area, is brief, few seconds, then  gone, occasionally at night before bed. Her legs hurt from RA. Her rhematologist is in [facility], she doesn't drive, due for appointment. Her brother takes her to appointment. No seizures. Last seizure was 2013.   HISTORY 06/22/2021 Dr. Anne Hahn: [name] is a 72 year old right-handed black female with a history of rheumatoid arthritis, cerebrovascular disease, headache, and a history of seizures.  She has been well controlled with her seizures on phenobarbital.  She has had ongoing daily headaches with sharp shooting pains in the temporal regions.  She was placed on Aimovig but never started this.  Bernita Raisin was not approved through the insurance.  She claims that she has stopped the [facility] powders.  Within the last 3 weeks, she had spontaneous onset of right heel and ankle pain, worse with weightbearing.  X-rays have been unrevealing.  She reports that there is some slight swelling of the right foot and ankle, and the ankle feels warm.  She reports that she feels weak all over.  She also reports some tingling sensations in the morning involving the left hand.  She comes back here for further evaluation.  She does report chronic low back pain and some pain down the left leg, not the right leg.  REVIEW OF SYSTEMS: Out of a complete 14 system review of symptoms, the patient complains only of the following symptoms, and all other reviewed systems are negative.  See HPI  ALLERGIES: Allergies  Allergen Reactions   Amoxicillin     REACTION: hives   Aspirin     REACTION: rash   Codeine Phosphate  REACTION: N/V   Flexeril [Cyclobenzaprine] Nausea And Vomiting    Dizziness    Ibuprofen Hives   Penicillins     REACTION: hives   Tetanus Toxoids Swelling    HOME MEDICATIONS: Outpatient Medications Prior to Visit  Medication Sig Dispense Refill   alendronate (FOSAMAX) 70 MG tablet TAKE 1 TABLET BY MOUTH ONCE WEEKLY 12 tablet 1   aspirin EC 81 MG EC tablet Take 1 tablet (81 mg total) by mouth  daily. 30 tablet 0   atorvastatin (LIPITOR) 80 MG tablet TAKE 1 TABLET BY MOUTH ONCE DAILY AT 6PM 30 tablet 4   carvedilol (COREG) 3.125 MG tablet Take 1 tablet (3.125 mg total) by mouth 2 (two) times daily with a meal. Needs appt before next refill. 60 tablet 0   citalopram (CELEXA) 40 MG tablet Take 1 tablet (40 mg total) by mouth daily. 30 tablet 3   clotrimazole (LOTRIMIN) 1 % cream Apply 1 application topically 2 (two) times daily. Under breasts 30 g 1   folic acid (FOLVITE) 1 MG tablet TAKE ONE TABLET BY MOUTH ONCE DAILY 30 tablet 1   gabapentin (NEURONTIN) 300 MG capsule TAKE THREE CAPSULES BY MOUTH TWICE DAILY 180 capsule 2   HYDROcodone-acetaminophen (NORCO) 10-325 MG tablet Take 1-2 tablets by mouth See admin instructions. 1 tablet in the morning and 2 tablets at night     Multiple Vitamins-Minerals (MULTIVITAMIN WITH MINERALS) tablet Take 1 tablet by mouth daily.     omeprazole (PRILOSEC) 20 MG capsule Take 1 capsule (20 mg total) by mouth daily. Please make an appointment for future refills 30 capsule 0   predniSONE (DELTASONE) 10 MG tablet Take 2 tablets (20 mg total) by mouth daily with breakfast for 5 days, THEN 1 tablet (10 mg total) daily with breakfast for 5 days. 15 tablet 0   tiotropium (SPIRIVA HANDIHALER) 18 MCG inhalation capsule INHALE THE CONTENTS OF 1 CAPSULE VIA HANDIHALER BY MOUTH ONCE DAILY 30 capsule 3   topiramate (TOPAMAX) 50 MG tablet Take 2 tablets (100 mg total) by mouth 2 (two) times daily. 360 tablet 0   triamcinolone ointment (KENALOG) 0.1 % Apply 1 Application topically 2 (two) times daily. Apply to front of your lower leg 30 g 1   VENTOLIN HFA 108 (90 Base) MCG/ACT inhaler INHALE ONE (1) TO TWO (2) PUFFS INTO THE LUNGS EVERY SIX HOURS AS NEEDED FOR WHEEZING OR SHORTNESS OF BREATH. 18 each 0   Erenumab-aooe (AIMOVIG) 140 MG/ML SOAJ Inject 140 mg into the skin every 30 (thirty) days. 1.12 mL 11   PHENObarbital (LUMINAL) 100 MG tablet Take 1 tablet (100 mg total)  by mouth daily. 180 tablet 0   No facility-administered medications prior to visit.    PAST MEDICAL HISTORY: Past Medical History:  Diagnosis Date   After cataract 07/22/2014   Asthma    Bilateral arm pain 01/13/2015   Chronic low back pain    Collagen vascular disease (HCC)    Conversion disorder with seizures or convulsions    On Phenobarbital.   COPD (chronic obstructive pulmonary disease) (HCC)    Depression    Ear pain, bilateral 03/30/2019   Fibromyalgia    08/18/15  [facility]   GERD (gastroesophageal reflux disease)    Headache 05/27/2013   History of CVA (cerebrovascular accident)    "11 minni srokes"   Hyperlipidemia    Hypertension    Migraine    Neck pain 02/21/2012   Personal history of colonic polyp - adenoma 03/01/2014  Personality disorder (HCC)    Productive cough 11/09/2020   Rash and nonspecific skin eruption 08/04/2020   Rheumatoid arthritis (HCC)    08/18/15  [facility]   Seizures (HCC)    last seizure 4 years ago   Sleep apnea    Mild. No need for CPAP.   Stroke [facility])     PAST SURGICAL HISTORY: Past Surgical History:  Procedure Laterality Date   ANKLE SURGERY Left    APPENDECTOMY     BACK SURGERY     BUNIONECTOMY     2 toes   CARDIAC CATHETERIZATION  2008   Normal. Dr Elease Hashimoto.   CHOLECYSTECTOMY     CYST EXCISION     leg   ESOPHAGOGASTRODUODENOSCOPY (EGD) WITH PROPOFOL N/A 06/27/2018   Procedure: ESOPHAGOGASTRODUODENOSCOPY (EGD) WITH PROPOFOL;  Surgeon: Wyline Mood, MD;  Location: [facility] ENDOSCOPY;  Service: Gastroenterology;  Laterality: N/A;   FLANK MASS EXCISION     FOOT TENDON SURGERY  june 2014   both feet   KNEE SURGERY     Bilateral.   NOSE SURGERY     SHOULDER SURGERY     bil.   TOTAL ABDOMINAL HYSTERECTOMY      FAMILY HISTORY: Family History  Problem Relation Age of Onset   Cirrhosis Mother    Diabetes Mother    Cirrhosis Father    Breast cancer Sister 10   Seizures Sister    Breast cancer Sister 66   Migraines Sister    Colon cancer  Neg Hx     SOCIAL HISTORY: Social History   Socioeconomic History   Marital status: Married    Spouse name: Clearence    Number of children: 1   Years of education: 11   Highest education level: Not on file  Occupational History   Occupation: Unemployed   Tobacco Use   Smoking status: Former    Passive exposure: Past   Smokeless tobacco: Never  Vaping Use   Vaping status: Never Used  Substance and Sexual Activity   Alcohol use: No   Drug use: No   Sexual activity: Not on file  Other Topics Concern   Not on file  Social History Narrative   Patient lives at home with her husband Marilu Favre.    Right Handed   Drinks no caffeine   Social Determinants of Health   Financial Resource Strain: Low Risk  (01/02/2023)   Overall Financial Resource Strain (CARDIA)    Difficulty of Paying Living Expenses: Not hard at all  Food Insecurity: No Food Insecurity (01/02/2023)   Hunger Vital Sign    Worried About Running Out of Food in the Last Year: Never true    Ran Out of Food in the Last Year: Never true  Transportation Needs: No Transportation Needs (01/02/2023)   PRAPARE - Administrator, Civil Service (Medical): No    Lack of Transportation (Non-Medical): No  Physical Activity: Not on file  Stress: Not on file  Social Connections: Not on file  Intimate Partner Violence: Not on file    PHYSICAL EXAM  Vitals:   05/28/23 1401  BP: 133/81  Pulse: (!) 57  Weight: 216 lb (98 kg)  Height: 5\' 4"  (1.626 m)   Body mass index is 37.08 kg/m.  Exam: NAD, pleasant                  Speech:    Speech is normal; fluent and spontaneous with normal comprehension.  Cognition:    The patient is oriented  to person, place, and time;     recent and remote memory intact;     language fluent;    Cranial Nerves:    The pupils are equal, round, and reactive to light.Trigeminal sensation is intact and the muscles of mastication are normal. The face is symmetric. The palate elevates  in the midline. Hearing intact. Voice is normal. Shoulder shrug is normal. The tongue has normal motion without fasciculations.   Coordination:  No dysmetria  Motor Observation:    No asymmetry, no atrophy, and no involuntary movements noted. Tone:    Normal muscle tone.     Strength:    Strength is V/V in the upper and lower limbs.      Sensation: intact to LT    DIAGNOSTIC DATA (LABS, IMAGING, TESTING) - I reviewed patient records, labs, notes, testing and imaging myself where available.  Lab Results  Component Value Date   WBC 6.8 05/28/2023   HGB 10.1 (L) 05/28/2023   HCT 32.9 (L) 05/28/2023   MCV 80.6 05/28/2023   PLT 218 05/28/2023      Component Value Date/Time   NA 138 05/28/2023 0848   NA 141 06/22/2021 1613   NA 140 09/01/2014 1600   K 4.3 05/28/2023 0848   K 4.3 09/01/2014 1600   CL 105 05/28/2023 0848   CL 111 (H) 09/01/2014 1600   CO2 24 05/28/2023 0848   CO2 18 (L) 09/01/2014 1600   GLUCOSE 85 05/28/2023 0848   GLUCOSE 78 09/01/2014 1600   BUN 11 05/28/2023 0848   BUN 10 06/22/2021 1613   BUN 10 09/01/2014 1600   CREATININE 0.95 05/28/2023 0848   CALCIUM 8.0 (L) 05/28/2023 0848   CALCIUM 8.3 (L) 09/01/2014 1600   PROT 7.5 05/28/2023 0848   PROT 7.9 06/22/2021 1613   PROT 6.8 08/16/2014 0431   ALBUMIN 4.2 06/22/2021 1613   ALBUMIN 2.7 (L) 08/16/2014 0431   AST 16 05/28/2023 0848   AST 16 08/16/2014 0431   ALT 10 05/28/2023 0848   ALT 17 08/16/2014 0431   ALKPHOS 146 (H) 06/22/2021 1613   ALKPHOS 86 08/16/2014 0431   BILITOT 0.2 05/28/2023 0848   BILITOT 0.2 06/22/2021 1613   BILITOT 0.3 08/16/2014 0431   GFRNONAA >60 12/27/2020 1703   GFRNONAA >60 09/01/2014 1600   GFRNONAA >60 07/13/2013 1013   GFRNONAA 84 08/26/2012 1520   GFRAA 93 07/28/2020 1559   GFRAA >60 09/01/2014 1600   GFRAA >60 07/13/2013 1013   GFRAA >89 08/26/2012 1520   Lab Results  Component Value Date   CHOL 206 (H) 09/09/2019   HDL 49 09/09/2019   LDLCALC 139 (H)  09/09/2019   TRIG 89 09/09/2019   CHOLHDL 4.2 09/09/2019   Lab Results  Component Value Date   HGBA1C 5.2 01/02/2021   Lab Results  Component Value Date   VITAMINB12 483 01/02/2021   Lab Results  Component Value Date   TSH 2.890 02/27/2021    Naomie Dean MD [facility] [address] [place], [place] [address] [phone]  I spent 25 minutes of face-to-face and non-face-to-face time with patient on the  1. Generalized convulsive epilepsy (HCC)   2. Chronic migraine without aura, with intractable migraine, so stated, with status migrainosus    diagnosis.  This included previsit chart review, lab review, study review, order entry, electronic health record documentation, patient education on the different diagnostic and therapeutic options, counseling and coordination of care, risks and benefits of management, compliance,  or risk factor reduction

## 2023-05-28 NOTE — Progress Notes (Signed)
Office Visit Note  Patient: [name]             Date of Birth: [birth year]           MRN: [id number]             PCP: Lorayne Bender, MD Referring: Westley Chandler, MD Visit Date: 05/28/2023   Subjective:  New Patient (Initial Visit) (Patient states her hands are hurting her the worst. Patient states she does have pain in her ankles. )   History of Present Illness: [name] is a 72 y.o. female here for evaluation management of seropositive erosive rheumatoid arthritis.  Original diagnosis in 2016 with joint pain and swelling in multiple areas including both elbows hands and right knee.  Initial treatments include methotrexate and prednisone.  Subsequently tried increase in methotrexate dose and switching to subcutaneous route with only partial disease response.  She was started on Humira in January 2019 with some disease response subsequently increased to weekly dosing in August 2019.  This was then disrupted and not following regularly starting due to coronavirus.  Off RA medication her worst problem is pain and swelling in the left hand.  She is also noticing some progressive worsening of changes at the MCP joints and lateral deviation in her fingers.  However she is having pretty much widespread pain throughout upper and lower extremities.  She takes gabapentin 300 mg twice daily and has been on chronic low-dose hydrocodone and back injections treatments used for chronic pain.    Labs reviewed 10/2017 HBV neg HCV neg  Imaging reviewed  10/2018 DEXA with osteopenia - FRAX 19%, 3.6%  Activities of Daily Living:  Patient reports morning stiffness for 20 minutes.   Patient Reports nocturnal pain.  Difficulty dressing/grooming: Reports Difficulty climbing stairs: Reports Difficulty getting out of chair: Reports Difficulty using hands for taps, buttons, cutlery, and/or writing: Reports  Review of Systems  Constitutional:  Positive for fatigue.  HENT:  Negative for mouth sores  and mouth dryness.   Eyes:  Negative for dryness.  Respiratory:  Positive for shortness of breath.   Cardiovascular:  Negative for chest pain and palpitations.  Gastrointestinal:  Negative for blood in stool, constipation and diarrhea.  Endocrine: Negative for increased urination.  Genitourinary:  Negative for involuntary urination.  Musculoskeletal:  Positive for joint pain, joint pain, joint swelling, myalgias, muscle weakness, morning stiffness, muscle tenderness and myalgias. Negative for gait problem.  Skin:  Negative for color change, rash, hair loss and sensitivity to sunlight.  Allergic/Immunologic: Positive for susceptible to infections.  Neurological:  Positive for headaches. Negative for dizziness.  Hematological:  Negative for swollen glands.  Psychiatric/Behavioral:  Positive for depressed mood. Negative for sleep disturbance. The patient is not nervous/anxious.     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   Cervical paraspinal muscle spasm 10/26/2016   Lower back pain 05/27/2016   Left hip  pain 05/27/2016   Fibromyalgia 08/18/2015   Rheumatoid arthritis (HCC) 03/09/2015   Essential hypertension, benign 02/18/2015   Cervical disc disorder with radiculopathy of cervical region 01/21/2015   Personal history of colonic polyp - adenoma 03/01/2014   Dyshidrotic eczema 12/29/2013   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   Frequent falls 02/10/2013   Generalized pain 08/30/2012   Migraine without aura 07/29/2007   DEPENDENCE, BARB/SED, CONTINUOUS 06/24/2007   HYPERLIPIDEMIA 12/19/2006   OBESITY, NOS 12/19/2006   Depression with anxiety 12/19/2006   PANIC ATTACKS 12/19/2006   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   COPD (chronic obstructive pulmonary disease) (HCC)    Depression    Ear pain, bilateral 03/30/2019   Fibromyalgia    08/18/15  [facility]   GERD (gastroesophageal reflux disease)    Headache 05/27/2013   History of CVA (cerebrovascular accident)    "11 minni srokes"   Hyperlipidemia    Hypertension    Migraine    Neck pain 02/21/2012   Personal history of colonic polyp - adenoma 03/01/2014   Personality disorder (HCC)    Productive cough 11/09/2020   Rash and nonspecific skin eruption 08/04/2020   Rheumatoid arthritis (HCC)    08/18/15  [facility]   Seizures (HCC)    last seizure 4 years ago   Sleep apnea    Mild. No need for CPAP.   Stroke [facility])     Family History  Problem Relation Age of Onset   Cirrhosis Mother    Diabetes Mother    Cirrhosis Father    Breast cancer Sister 17   Breast cancer Sister 82   Colon cancer Neg Hx    Past Surgical History:  Procedure Laterality Date   ANKLE SURGERY Left    APPENDECTOMY     BACK SURGERY     BUNIONECTOMY     2 toes   CARDIAC CATHETERIZATION  2008   Normal. Dr Elease Hashimoto.   CHOLECYSTECTOMY     CYST EXCISION     leg   ESOPHAGOGASTRODUODENOSCOPY (EGD) WITH PROPOFOL N/A 06/27/2018   Procedure: ESOPHAGOGASTRODUODENOSCOPY (EGD) WITH PROPOFOL;  Surgeon: Wyline Mood, MD;  Location: [facility] ENDOSCOPY;  Service: Gastroenterology;   Laterality: N/A;   FLANK MASS EXCISION     FOOT TENDON SURGERY  june 2014   both feet   KNEE SURGERY     Bilateral.   NOSE SURGERY     SHOULDER SURGERY     bil.   TOTAL ABDOMINAL HYSTERECTOMY     Social History   Social History Narrative   Patient lives at home with her husband Marilu Favre.    Right Handed   Drinks no caffeine   Immunization History  Administered Date(s) Administered   Fluad Quad(high Dose 65+) 08/03/2020, 12/28/2022   Influenza Split 08/21/2011   Influenza Whole 07/29/2007, 07/29/2008, 11/07/2009   Influenza,inj,Quad PF,6+ Mos 07/31/2013, 10/16/2018   Influenza-Unspecified 08/18/2015   PFIZER(Purple Top)SARS-COV-2 Vaccination 12/24/2019, 01/14/2020   Pneumococcal Conjugate-13 08/18/2015   Pneumococcal Polysaccharide-23 07/29/2007, 02/15/2011, 12/15/2015     Objective: Vital Signs: BP 111/75 (BP Location: Right Arm, Patient Position: Sitting, Cuff Size: Normal)   Pulse (!) 56   Resp 16  Ht 5' 4.5" (1.638 m)   Wt 217 lb (98.4 kg)   BMI 36.67 kg/m    Physical Exam Constitutional:      Appearance: She is obese.  HENT:     Mouth/Throat:     Mouth: Mucous membranes are moist.     Pharynx: Oropharynx is clear.  Eyes:     Conjunctiva/sclera: Conjunctivae normal.  Neck:     Comments: Tender nodule on side of neck Cardiovascular:     Rate and Rhythm: Normal rate and regular rhythm.  Pulmonary:     Effort: Pulmonary effort is normal.     Breath sounds: Normal breath sounds.  Musculoskeletal:     Right lower leg: No edema.     Left lower leg: No edema.  Lymphadenopathy:     Cervical: No cervical adenopathy.  Skin:    General: Skin is warm and dry.     Findings: No rash.  Neurological:     Mental Status: She is alert.  Psychiatric:        Mood and Affect: Mood normal.      Musculoskeletal Exam:  Shoulder guarding against above horizontal movement, passive ROM inctact, no swelling Elbows full ROM no tenderness or swelling Wrists left side  decreased ROM, no palpable swelling Fingers swelling left 2nd-3rd MCPs, tenderness throughout, unable to actively straighten but passively reducible Knees crepitus, mild tenderness to pressure, no effusions   CDAI Exam: CDAI Score: 17  Patient Global: 50 / 100; Provider Global: 20 / 100 Swollen: 2 ; Tender: 8  Joint Exam 05/28/2023      Right  Left  Glenohumeral   Tender   Tender  Wrist      Tender  MCP 1      Tender  MCP 2     Swollen Tender  MCP 3     Swollen Tender  MCP 4      Tender  MCP 5      Tender     Investigation: No additional findings.  Imaging: No results found.  Recent Labs: Lab Results  Component Value Date   WBC 6.1 06/22/2021   HGB 11.3 06/22/2021   PLT 199 06/22/2021   NA 141 06/22/2021   K 5.0 06/22/2021   CL 105 06/22/2021   CO2 22 06/22/2021   GLUCOSE 94 06/22/2021   BUN 10 06/22/2021   CREATININE 0.93 06/22/2021   BILITOT 0.2 06/22/2021   ALKPHOS 146 (H) 06/22/2021   AST 23 06/22/2021   ALT 14 06/22/2021   PROT 7.9 06/22/2021   ALBUMIN 4.2 06/22/2021   CALCIUM 9.1 06/22/2021   GFRAA 93 07/28/2020    Speciality Comments: No specialty comments available.  Procedures:  No procedures performed Allergies: Amoxicillin, Aspirin, Codeine phosphate, Flexeril [cyclobenzaprine], Ibuprofen, Penicillins, and Tetanus toxoids   Assessment / Plan:     Visit Diagnoses: Rheumatoid arthritis involving shoulder, unspecified laterality, unspecified whether rheumatoid factor present (HCC) - Plan: predniSONE (DELTASONE) 10 MG tablet, Sedimentation rate, C-reactive protein, XR Hand 2 View Right, XR Hand 2 View Left, DG Chest 2 View  History and findings consistent with seropositive erosive rheumatoid arthritis currently active due to lack of treatment.  X-rays of bilateral hands in clinic today does look consistent for erosions involving the left hand MCP joints and generally decreased bone density.  Checking sed rate and CRP for disease activity monitoring.   She reports limited response to methotrexate will consider leflunomide as first-line DMARD to resume for looking at Biologics.  Prednisone taper for  2 weeks today other treatment pending results.  Thyroid nodule  High risk medication use - Plan: CBC with Differential/Platelet, COMPLETE METABOLIC PANEL WITH GFR, QuantiFERON-TB Gold Plus  Checking CBC and CMP and quant to [place] baseline for medication monitoring considering resumption of DMARD previously needed Biologics.  Checking baseline chest x-ray.  For initial symptom relief and during workup starting on prednisone taper for now reviewed risk of medication including increased blood pressure blood sugar and fluid retention.  Fibromyalgia  Pretty widespread tenderness to pressure especially over some proximal muscle groups in back upper arms and upper legs.  Has history of fibromyalgia syndrome.  Discussed this is often coexistent with chronic pain due to sensitization or acute muscle compensation for joints.  Recommended to check out fibromyalgia patient guide.  Orders: Orders Placed This Encounter  Procedures   XR Hand 2 View Right   XR Hand 2 View Left   DG Chest 2 View   Sedimentation rate   C-reactive protein   CBC with Differential/Platelet   COMPLETE METABOLIC PANEL WITH GFR   QuantiFERON-TB Gold Plus   Meds ordered this encounter  Medications   predniSONE (DELTASONE) 10 MG tablet    Sig: Take 2 tablets (20 mg total) by mouth daily with breakfast for 5 days, THEN 1 tablet (10 mg total) daily with breakfast for 5 days.    Dispense:  15 tablet    Refill:  0     Follow-Up Instructions: Return in about 3 weeks (around 06/18/2023) for New pt RA GC f/u 2-4wks.   Fuller Plan, MD  Note - This record has been created using [employer].  Chart creation errors have been sought, but may not always  have been located. Such creation errors do not reflect on  the standard of medical care.

## 2023-05-28 NOTE — Patient Instructions (Signed)
We will start the process of vyepti.    Eptinezumab Injection What is this medication? EPTINEZUMAB (EP ti NEZ ue mab) prevents migraines. It works by blocking a substance in the body that causes migraines. It is a monoclonal antibody. This medicine may be used for other purposes; ask your health care provider or pharmacist if you have questions. COMMON BRAND NAME(S): Vyepti What should I tell my care team before I take this medication? They need to know if you have any of these conditions: An unusual or allergic reaction to eptinezumab, other medications, foods, dyes, or preservatives Pregnant or trying to get pregnant Breast-feeding How should I use this medication? This medication is injected into a vein. It is given by your care team in a hospital or clinic setting. Talk to your care team about the use of this medication in children. Special care may be needed. Overdosage: If you think you have taken too much of this medicine contact a poison control center or emergency room at once. NOTE: This medicine is only for you. Do not share this medicine with others. What if I miss a dose? Keep appointments for follow-up doses. It is important not to miss your dose. Call your care team if you are unable to keep an appointment. What may interact with this medication? Interactions are not expected. This list may not describe all possible interactions. Give your health care provider a list of all the medicines, herbs, non-prescription drugs, or dietary supplements you use. Also tell them if you smoke, drink alcohol, or use illegal drugs. Some items may interact with your medicine. What should I watch for while using this medication? Your condition will be monitored carefully while you are receiving this medication. What side effects may I notice from receiving this medication? Side effects that you should report to your care team as soon as possible: Allergic reactions or angioedema--skin rash,  itching or hives, swelling of the face, eyes, lips, tongue, arms, or legs, trouble swallowing or breathing Side effects that usually do not require medical attention (report to your care team if they continue or are bothersome): Runny or stuffy nose This list may not describe all possible side effects. Call your doctor for medical advice about side effects. You may report side effects to FDA at 1-800-FDA-1088. Where should I keep my medication? This medication is given in a hospital or clinic. It will not be stored at home. NOTE: This sheet is a summary. It may not cover all possible information. If you have questions about this medicine, talk to your doctor, pharmacist, or health care provider.  2024 Elsevier/Gold Standard (2021-12-04 00:00:00)

## 2023-05-29 ENCOUNTER — Telehealth: Payer: Self-pay | Admitting: Neurology

## 2023-05-29 NOTE — Telephone Encounter (Signed)
Please start Vyepti protocol g43.711

## 2023-05-30 NOTE — Telephone Encounter (Signed)
Vyepti order signed by Dr Lucia Gaskins and given to [facility] w/ [facility] to start process.

## 2023-05-30 NOTE — Telephone Encounter (Signed)
Vyepti 100 mg IV q3 months order form completed and is pending Dr Trevor Mace signature.

## 2023-06-05 ENCOUNTER — Other Ambulatory Visit: Payer: Self-pay | Admitting: Internal Medicine

## 2023-06-05 DIAGNOSIS — M069 Rheumatoid arthritis, unspecified: Secondary | ICD-10-CM

## 2023-06-07 ENCOUNTER — Other Ambulatory Visit: Payer: Self-pay

## 2023-06-07 MED ORDER — FOLIC ACID 1 MG PO TABS: 1.0000 mg | ORAL_TABLET | Freq: Every day | ORAL | 1 refills | Status: DC

## 2023-06-10 ENCOUNTER — Other Ambulatory Visit: Payer: Self-pay | Admitting: Family Medicine

## 2023-06-10 DIAGNOSIS — I10 Essential (primary) hypertension: Secondary | ICD-10-CM

## 2023-06-11 ENCOUNTER — Other Ambulatory Visit: Payer: Self-pay

## 2023-06-13 ENCOUNTER — Other Ambulatory Visit: Payer: Self-pay | Admitting: Family Medicine

## 2023-06-13 DIAGNOSIS — I10 Essential (primary) hypertension: Secondary | ICD-10-CM

## 2023-06-13 DIAGNOSIS — R058 Other specified cough: Secondary | ICD-10-CM

## 2023-06-13 MED ORDER — ALBUTEROL SULFATE HFA 108 (90 BASE) MCG/ACT IN AERS
2.0000 | INHALATION_SPRAY | Freq: Four times a day (QID) | RESPIRATORY_TRACT | 0 refills | Status: DC | PRN
Start: 2023-06-13 — End: 2023-07-07

## 2023-06-13 MED ORDER — CARVEDILOL 3.125 MG PO TABS
3.1250 mg | ORAL_TABLET | Freq: Two times a day (BID) | ORAL | 0 refills | Status: DC
Start: 2023-06-13 — End: 2023-06-27

## 2023-06-17 NOTE — Progress Notes (Unsigned)
Office Visit Note  Patient: [name]             Date of Birth: November 20, 1950           MRN: [id number]             PCP: Lorayne Bender, MD Referring: Lorayne Bender, MD Visit Date: 06/18/2023   Subjective:  No chief complaint on file.   History of Present Illness: [name] is a 72 y.o. female here for follow up ***   Previous HPI 05/28/23 [name] is a 72 y.o. female here for evaluation management of seropositive erosive rheumatoid arthritis.  Original diagnosis in 2016 with joint pain and swelling in multiple areas including both elbows hands and right knee.  Initial treatments include methotrexate and prednisone.  Subsequently tried increase in methotrexate dose and switching to subcutaneous route with only partial disease response.  She was started on Humira in January 2019 with some disease response subsequently increased to weekly dosing in August 2019.  This was then disrupted and not following regularly starting due to coronavirus.  Off RA medication her worst problem is pain and swelling in the left hand.  She is also noticing some progressive worsening of changes at the MCP joints and lateral deviation in her fingers.  However she is having pretty much widespread pain throughout upper and lower extremities.  She takes gabapentin 300 mg twice daily and has been on chronic low-dose hydrocodone and back injections treatments used for chronic pain.     Labs reviewed 10/2017 HBV neg HCV neg   Imaging reviewed  10/2018 DEXA with osteopenia - FRAX 19%, 3.6%   No Rheumatology ROS completed.   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/18/2017   Cervical paraspinal muscle spasm 10/26/2016   Lower back pain 05/27/2016   Left hip pain 05/27/2016   Fibromyalgia 08/18/2015   Rheumatoid arthritis (HCC) 03/09/2015   Essential hypertension, benign 02/18/2015   Cervical disc disorder with radiculopathy of cervical region 01/21/2015   Personal history of colonic polyp - adenoma 03/01/2014   Dyshidrotic eczema 12/29/2013   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   Frequent falls 02/10/2013   Generalized pain 08/30/2012   Chronic migraine without aura, with intractable migraine, so stated, with status migrainosus 07/29/2007   DEPENDENCE, BARB/SED, CONTINUOUS 06/24/2007   HYPERLIPIDEMIA 12/19/2006   OBESITY, NOS 12/19/2006   Depression with anxiety 12/19/2006   PANIC ATTACKS 12/19/2006   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   COPD (chronic obstructive pulmonary disease) (HCC)    Depression    Ear pain,  bilateral 03/30/2019   Fibromyalgia    08/18/15  [facility]   GERD (gastroesophageal reflux disease)    Headache 05/27/2013   History of CVA (cerebrovascular accident)    "11 minni srokes"   Hyperlipidemia    Hypertension    Migraine    Neck pain 02/21/2012   Personal history of colonic polyp - adenoma 03/01/2014   Personality disorder (HCC)    Productive cough 11/09/2020   Rash and nonspecific skin eruption 08/04/2020   Rheumatoid arthritis (HCC)    08/18/15  [facility]   Seizures (HCC)    last  seizure 4 years ago   Sleep apnea    Mild. No need for CPAP.   Stroke [facility])     Family History  Problem Relation Age of Onset   Cirrhosis Mother    Diabetes Mother    Cirrhosis Father    Breast cancer Sister 65   Seizures Sister    Breast cancer Sister 40   Migraines Sister    Colon cancer Neg Hx    Past Surgical History:  Procedure Laterality Date   ANKLE SURGERY Left    APPENDECTOMY     BACK SURGERY     BUNIONECTOMY     2 toes   CARDIAC CATHETERIZATION  2008   Normal. Dr Elease Hashimoto.   CHOLECYSTECTOMY     CYST EXCISION     leg   ESOPHAGOGASTRODUODENOSCOPY (EGD) WITH PROPOFOL N/A 06/27/2018   Procedure: ESOPHAGOGASTRODUODENOSCOPY (EGD) WITH PROPOFOL;  Surgeon: Wyline Mood, MD;  Location[facility] ENDOSCOPY;  Service: Gastroenterology;  Laterality: N/A;   FLANK MASS EXCISION     FOOT TENDON SURGERY  june 2014   both feet   KNEE SURGERY     Bilateral.   NOSE SURGERY     SHOULDER SURGERY     bil.   TOTAL ABDOMINAL HYSTERECTOMY     Social History   Social History Narrative   Patient lives at home with her husband Marilu Favre.    Right Handed   Drinks no caffeine   Immunization History  Administered Date(s) Administered   Fluad Quad(high Dose 65+) 08/03/2020, 12/28/2022   Influenza Split 08/21/2011   Influenza Whole 07/29/2007, 07/29/2008, 11/07/2009   Influenza,inj,Quad PF,6+ Mos 07/31/2013, 10/16/2018   Influenza-Unspecified 08/18/2015   PFIZER(Purple Top)SARS-COV-2 Vaccination 12/24/2019, 01/14/2020   Pneumococcal Conjugate-13 08/18/2015   Pneumococcal Polysaccharide-23 07/29/2007, 02/15/2011, 12/15/2015     Objective: Vital Signs: There were no vitals taken for this visit.   Physical Exam   Musculoskeletal Exam: ***  CDAI Exam: CDAI Score: -- Patient Global: --; Provider Global: -- Swollen: --; Tender: -- Joint Exam 06/18/2023   No joint exam has been documented for this visit   There is currently no information documented on the homunculus. Go to the  Rheumatology activity and complete the homunculus joint exam.  Investigation: No additional findings.  Imaging: DG Chest 2 View  Result Date: 06/02/2023 CLINICAL DATA:  Baseline chest x-ray for rheumatoid arthritis. Former smoker. EXAM: CHEST - 2 VIEW COMPARISON:  08/03/2021 FINDINGS: Lungs are adequately inflated and otherwise clear. Cardiomediastinal silhouette and remainder of the exam is unchanged. IMPRESSION: No active cardiopulmonary disease. Electronically Signed   By: Elberta Fortis M.D.   On: 06/02/2023 11:35   XR Hand 2 View Left  Result Date: 05/28/2023 X-ray left hand 2 views Radiocarpal joint appears normal.  There are mild degenerative change at the first [facility] joint peers to be some tapering in the first proximal phalanx.  MCP joints with joint space narrowing and  marginal erosions probable early subluxation.  Review of distal finger joints limited by inability to fully straighten but no distal erosions seen.  Bone realization consistent with generalized osteopenia. Impression MCP joint erosions and subluxation along with generalized osteopenia consistent with longstanding rheumatoid arthritis  XR Hand 2 View Right  Result Date: 05/28/2023 X-ray right hand 2 views Radial border bone spurs at radiocarpal joint.  Bone spurring at the first [facility] joint.  Widening of first MCP joint others appear normal.  Lateral osteophytes at DIPs most advanced second and third digit.  No erosions or abnormal calcifications seen.  Bone mineralization consistent with generalized osteopenia. Impression Osteoarthritis of the wrist proximal thumb and mild in distal finger joints, no visible erosions   Recent Labs: Lab Results  Component Value Date   WBC 6.8 05/28/2023   HGB 10.1 (L) 05/28/2023   PLT 218 05/28/2023   NA 138 05/28/2023   K 4.3 05/28/2023   CL 105 05/28/2023   CO2 24 05/28/2023   GLUCOSE 85 05/28/2023   BUN 11 05/28/2023   CREATININE 0.95 05/28/2023   BILITOT 0.2 05/28/2023   ALKPHOS  146 (H) 06/22/2021   AST 16 05/28/2023   ALT 10 05/28/2023   PROT 7.5 05/28/2023   ALBUMIN 4.2 06/22/2021   CALCIUM 8.0 (L) 05/28/2023   GFRAA 93 07/28/2020   QFTBGOLDPLUS NEGATIVE 05/28/2023    Speciality Comments: No specialty comments available.  Procedures:  No procedures performed Allergies: Amoxicillin, Aspirin, Codeine phosphate, Flexeril [cyclobenzaprine], Ibuprofen, Penicillins, and Tetanus toxoids   Assessment / Plan:     Visit Diagnoses: No diagnosis found.  ***  Orders: No orders of the defined types were placed in this encounter.  No orders of the defined types were placed in this encounter.    Follow-Up Instructions: No follow-ups on file.   Fuller Plan, MD  Note - This record has been created using [employer].  Chart creation errors have been sought, but may not always  have been located. Such creation errors do not reflect on  the standard of medical care.

## 2023-06-18 ENCOUNTER — Ambulatory Visit: Payer: Medicare Other | Attending: Internal Medicine | Admitting: Internal Medicine

## 2023-06-18 ENCOUNTER — Encounter: Payer: Self-pay | Admitting: Internal Medicine

## 2023-06-18 VITALS — BP 122/83 | HR 59 | Resp 14 | Ht 65.0 in | Wt 218.0 lb

## 2023-06-18 DIAGNOSIS — M069 Rheumatoid arthritis, unspecified: Secondary | ICD-10-CM | POA: Diagnosis not present

## 2023-06-18 DIAGNOSIS — M797 Fibromyalgia: Secondary | ICD-10-CM | POA: Diagnosis not present

## 2023-06-18 DIAGNOSIS — G6289 Other specified polyneuropathies: Secondary | ICD-10-CM

## 2023-06-18 DIAGNOSIS — Z79899 Other long term (current) drug therapy: Secondary | ICD-10-CM

## 2023-06-18 NOTE — Patient Instructions (Signed)
Leflunomide Tablets What is this medication? LEFLUNOMIDE (le FLOO na mide) treats the symptoms of rheumatoid arthritis. It works by slowing down an overactive immune system. This decreases inflammation. It belongs to a group of medications called DMARDs. This medicine may be used for other purposes; ask your health care provider or pharmacist if you have questions. COMMON BRAND NAME(S): Arava What should I tell my care team before I take this medication? They need to know if you have any of these conditions: Cancer Diabetes High blood pressure Immune system problems Infection Kidney disease Liver disease Low blood cell levels (white cells, red cells, and platelets) Lung or breathing disease, such as asthma or COPD Recent or upcoming vaccine Skin conditions Tingling of the fingers or toes, or other nerve disorder An unusual or allergic reaction to leflunomide, other medications, food, dyes, or preservatives Pregnant or trying to get pregnant Breastfeeding How should I use this medication? Take this medication by mouth with a full glass of water. Take it as directed on the prescription label at the same time every day. Keep taking it unless your care team tells you to stop. Talk to your care team about the use of this medication in children. Special care may be needed. Overdosage: If you think you have taken too much of this medicine contact a poison control center or emergency room at once. NOTE: This medicine is only for you. Do not share this medicine with others. What if I miss a dose? If you miss a dose, take it as soon as you can. If it is almost time for your next dose, take only that dose. Do not take double or extra doses. What may interact with this medication? Do not take this medication with any of the following: Teriflunomide This medication may also interact with the following: Alosetron Caffeine Cefaclor Certain medications for diabetes, such as nateglinide,  repaglinide, rosiglitazone, pioglitazone Certain medications for high cholesterol, such as atorvastatin, pravastatin, rosuvastatin, simvastatin Charcoal Cholestyramine Ciprofloxacin Duloxetine Estrogen and progestin hormones Furosemide Ketoprofen Live virus vaccines Medications that increase your risk for infection Methotrexate Mitoxantrone Paclitaxel Penicillin Theophylline Tizanidine Warfarin This list may not describe all possible interactions. Give your health care provider a list of all the medicines, herbs, non-prescription drugs, or dietary supplements you use. Also tell them if you smoke, drink alcohol, or use illegal drugs. Some items may interact with your medicine. What should I watch for while using this medication? Visit your care team for regular checks on your progress. Tell your care team if your symptoms do not start to get better or if they get worse. You may need blood work done while you are taking this medication. This medication may cause serious skin reactions. They can happen weeks to months after starting the medication. Contact your care team right away if you notice fevers or flu-like symptoms with a rash. The rash may be red or purple and then turn into blisters or peeling of the skin. You may also notice a red rash with swelling of the face, lips, or lymph nodes in your neck or under your arms. You should not receive certain vaccines during your treatment and for a certain time after your treatment with this medication ends. Talk to your care team for more information. This medication may stay in your body for up to 2 years after your last dose. Tell your care team about any unusual side effects or symptoms. A medication can be given to help lower your blood levels of this  medication more quickly. Talk to your care team if you may be pregnant. This medication can cause serious birth defects if taken during pregnancy and for a while after the last dose. You will  need a negative pregnancy test before starting this medication. Contraception is recommended while taking this medication and for a while after the last dose. Your care team can help you find the option that works for you. Do not breastfeed while taking this medication. What side effects may I notice from receiving this medication? Side effects that you should report to your care team as soon as possible: Allergic reactions--skin rash, itching, hives, swelling of the face, lips, tongue, or throat Dry cough, shortness of breath or trouble breathing Increase in blood pressure Infection--fever, chills, cough, sore throat, wounds that don't heal, pain or trouble when passing urine, general feeling of discomfort or being unwell Redness, blistering, peeling, or loosening of the skin, including inside the mouth Liver injury--right upper belly pain, loss of appetite, nausea, light-colored stool, dark yellow or brown urine, yellowing skin or eyes, unusual weakness or fatigue Pain, tingling, or numbness in the hands or feet Unusual bruising or bleeding Side effects that usually do not require medical attention (report to your care team if they continue or are bothersome): Back pain Diarrhea Hair loss Headache Nausea This list may not describe all possible side effects. Call your doctor for medical advice about side effects. You may report side effects to FDA at 1-800-FDA-1088. Where should I keep my medication? Keep out of the reach of children and pets. Store at room temperature between 20 and 25 degrees C (68 and 77 degrees F). Protect from moisture and light. Keep the container tightly closed. Get rid of any unused medication after the expiration date. To get rid of medications that are no longer needed or have expired: Take the medication to a medication take-back program. Check with your pharmacy or law enforcement to find a location. If you cannot return the medication, ask your pharmacist or  care team how to get rid of this medication safely. NOTE: This sheet is a summary. It may not cover all possible information. If you have questions about this medicine, talk to your doctor, pharmacist, or health care provider.  2024 Elsevier/Gold Standard (2022-03-08 00:00:00)

## 2023-06-19 ENCOUNTER — Other Ambulatory Visit: Payer: Self-pay | Admitting: Family Medicine

## 2023-06-25 ENCOUNTER — Telehealth: Payer: Self-pay

## 2023-06-25 MED ORDER — LEFLUNOMIDE 20 MG PO TABS
20.0000 mg | ORAL_TABLET | Freq: Every day | ORAL | 1 refills | Status: AC
Start: 2023-06-25 — End: ?

## 2023-06-25 NOTE — Telephone Encounter (Signed)
That was indeed the plan and baseline labs are fine for it. Sent Rx for leflunomide now.

## 2023-06-25 NOTE — Telephone Encounter (Signed)
Patient advised That was indeed the plan and baseline labs are fine for it. Dr. Dimple Casey Sent Rx for leflunomide now. Patient verbalized understanding.

## 2023-06-25 NOTE — Telephone Encounter (Signed)
Patient contacted the office stating the medicine that Dr. Dimple Casey was going to start her on has not been sent in yet. Patient states the medication is the Leflunomide. Per last office note on 06/18/2023, Recommend starting on leflunomide 20 mg daily. Patient states her pharmacy is [facility] on [address]. Please advise.

## 2023-06-25 NOTE — Addendum Note (Signed)
Addended by: Fuller Plan on: 06/25/2023 04:09 PM   Modules accepted: Orders

## 2023-06-25 NOTE — Telephone Encounter (Signed)
Attempted to contact the patient and left a message to call the office back regarding lab results.

## 2023-06-27 ENCOUNTER — Ambulatory Visit: Payer: Medicare Other | Admitting: Family Medicine

## 2023-06-27 ENCOUNTER — Encounter: Payer: Self-pay | Admitting: Family Medicine

## 2023-06-27 ENCOUNTER — Ambulatory Visit
Admission: RE | Admit: 2023-06-27 | Discharge: 2023-06-27 | Disposition: A | Discharge status: Home / Self Care | Payer: Medicare Other | Source: Ambulatory Visit | Attending: Family Medicine | Admitting: Family Medicine

## 2023-06-27 VITALS — BP 121/64 | HR 68 | Ht 65.0 in | Wt 209.6 lb

## 2023-06-27 DIAGNOSIS — K219 Gastro-esophageal reflux disease without esophagitis: Secondary | ICD-10-CM | POA: Diagnosis not present

## 2023-06-27 DIAGNOSIS — I10 Essential (primary) hypertension: Secondary | ICD-10-CM | POA: Diagnosis not present

## 2023-06-27 DIAGNOSIS — R0609 Other forms of dyspnea: Secondary | ICD-10-CM

## 2023-06-27 DIAGNOSIS — E785 Hyperlipidemia, unspecified: Secondary | ICD-10-CM

## 2023-06-27 DIAGNOSIS — Z23 Encounter for immunization: Secondary | ICD-10-CM

## 2023-06-27 MED ORDER — ATORVASTATIN CALCIUM 80 MG PO TABS
80.0000 mg | ORAL_TABLET | Freq: Every day | ORAL | 4 refills | Status: DC
Start: 2023-06-27 — End: 2024-08-31

## 2023-06-27 MED ORDER — CARVEDILOL 3.125 MG PO TABS: 3.1250 mg | ORAL_TABLET | Freq: Two times a day (BID) | ORAL | 3 refills | Status: DC

## 2023-06-27 MED ORDER — OMEPRAZOLE 20 MG PO CPDR
20.0000 mg | DELAYED_RELEASE_CAPSULE | Freq: Every day | ORAL | 3 refills | Status: DC
Start: 2023-06-27 — End: 2024-06-26

## 2023-06-27 NOTE — Patient Instructions (Addendum)
Thank you for coming in today!  Things we discussed today: For your shortness of breath, I would like you to get a chest x-ray. Please go to [facility] at [address] or at [facility] to have this completed.  You do not need an appointment, but if you would like to call them beforehand, their number is [phone].  We will contact you with your results afterwards.  2.  We will call you about getting the heart ultrasound 3. Please make an appointment with our pharmacist, Dr Raymondo Band, to evaluate your lung function (PFTs)  Have a great day!

## 2023-06-27 NOTE — Assessment & Plan Note (Signed)
BP: 121/64 today. Well controlled. Goal of <130/80. Continue to work on healthy dietary habits and exercise.   Medication regimen: Carvedilol 3.125 daily

## 2023-06-27 NOTE — Progress Notes (Signed)
    SUBJECTIVE:   CHIEF COMPLAINT / HPI:   Dyspnea on exertion -has been going on for about 1 month -with exertion, I.e. feels short of breath after walking back to the exam room from the lobby -no dyspnea at rest -albuterol helps but does not fully relieve symptoms -has been using albuterol for wheezing/coughing for years -used spiriva in the past, this did not help -former smoker, 2-3 ppd for years -no chest pain, no orthopnea, no recent URI -denies past hospitalizations for COPD exacerbation  HTN -coreg 3.125 BID -taking daily -no concerns  PERTINENT  PMH / PSH: fibromyalgia, chronic migraine, CAD, CVA, reflux esophagitis, epilepsy, RA  OBJECTIVE:   BP 121/64   Pulse 68   Ht 5\' 5"  (1.651 m)   Wt 209 lb 9.6 oz (95.1 kg)   SpO2 100%   BMI 34.88 kg/m     Physical Exam Gen: elderly female sitting in exam room in NAD CV: RRR, normal S1/S2, no murmurs, rubs, or gallops Pulm: CTAB, no wheezes, rales, or rhonchi, normal WOB Ext: mild edema and tenderness to LLE (pt states this is chronic)  ASSESSMENT/PLAN:   Dyspnea on exertion New dyspnea on exertion in patient with history of heavy tobacco use and COPD. Differential includes worsening of COPD, other lung disease, heart failure, or general deconditioning. No hospitalizations for COPD exacerbation in the past and no recent PFTs. Does not appear to be cardiac in nature given lack of chest pain, orthopnea, and no other signs/symptoms of volume overload. Last echo in 2022 with normal EF.  -Will order CXR and pt to see Dr Raymondo Band for PFTs and assistance with respiratory medication management. -Will repeat echo to rule out CHF   Essential hypertension, benign BP: 121/64 today. Well controlled. Goal of <130/80. Continue to work on healthy dietary habits and exercise.   Medication regimen: Carvedilol 3.125 daily    Patient Instructions  Thank you for coming in today!  Things we discussed today: For your shortness of  breath, I would like you to get a chest x-ray. Please go to [facility] at [address] or at [facility] to have this completed.  You do not need an appointment, but if you would like to call them beforehand, their number is [phone].  We will contact you with your results afterwards.  2.  We will call you about getting the heart ultrasound 3. Please make an appointment with our pharmacist, Dr Raymondo Band, to evaluate your lung function (PFTs)  Have a great day!  Lorayne Bender, MD [facility]

## 2023-06-27 NOTE — Assessment & Plan Note (Addendum)
New dyspnea on exertion in patient with history of heavy tobacco use and COPD. Differential includes worsening of COPD, other lung disease, heart failure, or general deconditioning. No hospitalizations for COPD exacerbation in the past and no recent PFTs. Does not appear to be cardiac in nature given lack of chest pain, orthopnea, and no other signs/symptoms of volume overload. Last echo in 2022 with normal EF.  -Will order CXR and pt to see Dr Raymondo Band for PFTs and assistance with respiratory medication management. -Will repeat echo to rule out CHF

## 2023-07-01 ENCOUNTER — Telehealth: Payer: Self-pay

## 2023-07-01 NOTE — Telephone Encounter (Signed)
-----   Message from [facility] sent at 06/28/2023  3:52 PM EDT ----- Regarding: Echo Hello!  Can you please schedule an echo for this patient at your convenience? Ideally something in the next few weeks. Shanda Bumps said there's no prior auth needed.  Thanks!!

## 2023-07-01 NOTE — Telephone Encounter (Signed)
Called to inform the patient when her ECHO appointment was but she did not answer. I left her a voicemail.

## 2023-07-04 ENCOUNTER — Ambulatory Visit (INDEPENDENT_AMBULATORY_CARE_PROVIDER_SITE_OTHER): Payer: Medicare Other | Admitting: Student

## 2023-07-04 ENCOUNTER — Encounter: Payer: Self-pay | Admitting: Pharmacist

## 2023-07-04 VITALS — BP 141/96 | HR 59 | Temp 97.6°F | Ht 65.0 in | Wt 210.4 lb

## 2023-07-04 DIAGNOSIS — R5383 Other fatigue: Secondary | ICD-10-CM

## 2023-07-04 DIAGNOSIS — R0609 Other forms of dyspnea: Secondary | ICD-10-CM | POA: Diagnosis not present

## 2023-07-04 DIAGNOSIS — J449 Chronic obstructive pulmonary disease, unspecified: Secondary | ICD-10-CM

## 2023-07-04 DIAGNOSIS — R5381 Other malaise: Secondary | ICD-10-CM | POA: Diagnosis present

## 2023-07-04 DIAGNOSIS — T50905A Adverse effect of unspecified drugs, medicaments and biological substances, initial encounter: Secondary | ICD-10-CM

## 2023-07-04 NOTE — Assessment & Plan Note (Addendum)
Reviewed CXR ordered by Dr. Dolan Amen last week. Has not yet had radiologist read, but there are no obvious abnormalities to my review. She is maintaining O2 sats of 99% on room air and with a normal work of breathing at rest. Agree that she needs further workup for causes such as CHF, COPD, and possible rheumatologic lung disease. Will almost certainly need a lung CT, but defer that at this time to allow time for leflunomide washout so that we have a clearer picture of what is going on.  - Follow up rads read of CXR - Echo next week - Further workup as above

## 2023-07-04 NOTE — Assessment & Plan Note (Addendum)
Symptoms have persisted since her vertiginous episode on the evening of 9/10. Broad differential and she has multiple risk factors for cardiac, CNS, and pulmonary disease.  The most likely cause of her symptoms is a drug reaction to her leflunomide given the temporal association between starting this medication and the onset of symptoms. Of note, she is on a number of other CNS-active drugs that could contribute, including citalopram, Norco, gabapentin, phenobarbital and topiramate. She has not taken this drug since Tuesday. It has quite a long half-life, so we may still be in the washout period. If this is the case, I would expect her symptoms to gradually improve and any worsening in her symptoms at this point would be a red flag.  Also considered VTE given triad of dyspnea, leg swelling, and weakness, but she has a Wells Score of 0.  Cardiac etiology, CHF specifically, remains on the differential, and she is scheduled for an echo next week. Less likely an acute coronary event given the lack of chest pain.  Primary pulmonologic disease seems possible, especially given her rheumatologic history and could also explain her underlying DOE.  Discussed ER presentation and possible admission for time-sensitive workup and rule out of cardiac and pulmonary processes, but she is adamantly opposed to this at present. Prefers an outpatient workup and voices understanding of strict ER precautions for any worsening in her symptoms or recurrence of her vertigo/nausea. She tells me that she plans to call 911 should she have any worsening in symptoms at all.  - STOP leflunomide. She will call her rheumatologist to tell of this probable drug reaction - BNP, CBC, TSH, CMP today  - Keep echo appointment next week - Strict ER precautions reviewed and she voices understanding  - Have made an appointment for short-interval follow-up in our office on Monday 9/16

## 2023-07-04 NOTE — Progress Notes (Signed)
   S:       Chief Complaint  Patient presents with   Medication Management    Dyspnea on exertion - PFT rescheduled due to illness   72 y.o. female who presents for diabetes evaluation, education, and management. Patient arrives in poor spirits, states "I am feeling bad" and presents with assistance of a cane.   Patient was referred and last seen by Primary Care Provider, Dr. Dolan Amen, on 06/27/2023.   PMH is significant for Rheumatoid arthritis.  Recently started leflunomide per rheumatology.  Reports symptoms of vertigo, nausea and lethargy the evening after she started the medication.  States she felt bad enough to remain in the bathroom for 20 minutes before returning to bed.   Deferred PFT evaluation today due to symptoms and had work-in visit with Dr. Marisue Humble.   Vitals and medications reviewed.  Total time in face to face counseling 14 minutes.    Follow-up:  Pharmacist 2 weeks PCP clinic visit PRN Patient seen with Alesia Banda, PharmD Candidate and Andee Poles, PharmD Candidate.

## 2023-07-04 NOTE — Assessment & Plan Note (Signed)
Deferred PFT evaluation today due to symptoms

## 2023-07-04 NOTE — Patient Instructions (Addendum)
STOP taking the leflunomide and call your rheumatologist to let them know about this reaction.  I am checking some labs today. I will call you if anything looks bad and I think you need to go to the ER. But I'm hoping to avoid this.  IF you have worsening symptoms or a recurrence of that episode where you felt so sick, I need you to go to the ER or call 911.   I believe these symptoms were due to the leflunomide in which case, they should get better instead of worse. Things getting worse is bad.   Be sure to attend your appointment for the echo.  Eliezer Mccoy, MD

## 2023-07-04 NOTE — Progress Notes (Signed)
SUBJECTIVE:   CHIEF COMPLAINT / HPI:   Generally Feeling Unwell Episode of Vertigo/Nausea Patient comes to me as work-in from Dr. Macky Lower clinic. Initially presented for PFTs as part of a dyspnea on exertion workup per Dr. Dolan Amen. However, on arrival told Dr. Raymondo Band of a vertiginous episode two nights ago and was moved to my clinic.  She tells me that her rheumatologist started her on leflunomide for her RA and then four days later (which would have been two days ago) she developed an acute episode of vertigo, nausea, and generalized weakness while sitting on the toilet after getting up to urinate in the middle of the night. This episode lasted about 20 minutes after which she was able to return to her bed and went  back to sleep. She has not felt back to herself since that time. Just feels weaker than usual. Can't really put her finger on it.  She denies any Chest pain, focal weakness, or neurologic changes aside from the vertigo with this. No headache. No change in bowel/bladder control. Does have ongoing dyspnea on exertion, but this is no worse now than when she saw Dr. Dolan Amen last week.   PERTINENT  PMH / PSH: Rheumatoid arthritis, Migraine without aura, Panic disorder, epilepsy, CAD  OBJECTIVE:   BP (!) 141/96   Pulse (!) 59   Temp 97.6 F (36.4 C)   Ht 5\' 5"  (1.651 m)   Wt 210 lb 6.4 oz (95.4 kg)   SpO2 99%   BMI 35.01 kg/m   Gen: Tired appearing but NAD Cardio: RRR, no murmur Pulm: Normal WOB on RA, lung sounds are somewhat diminished throughout, but without any wheezes, rales, or rhonchi MSK: there is bilateral LE non-pitting edema, L>R, with pretibial tenderness. No warmth, redness, or calf tenderness.  Neuro: Ambulating with a cane. A&Ox4. CN II-XII are intact. Strength is 5/5 in all four extremities and sensation is grossly intact throughout.    ASSESSMENT/PLAN:   Malaise and fatigue Likely Drug Reaction to Leflunomide Symptoms have persisted since her vertiginous  episode on the evening of 9/10. Broad differential and she has multiple risk factors for cardiac, CNS, and pulmonary disease.  The most likely cause of her symptoms is a drug reaction to her leflunomide given the temporal association between starting this medication and the onset of symptoms. Of note, she is on a number of other CNS-active drugs that could contribute, including citalopram, Norco, gabapentin, phenobarbital and topiramate. She has not taken this drug since Tuesday. It has quite a long half-life, so we may still be in the washout period. If this is the case, I would expect her symptoms to gradually improve and any worsening in her symptoms at this point would be a red flag.  Considered acute CVA but she has no neuro deficits and symptoms would be quite atypical of TIA. She remains on ASA daily for history of prior stroke. I suppose this could be part of her complex migraine picture, but would seem to be quite atypical.  Also considered VTE given triad of dyspnea, leg swelling, and weakness, but she has a Wells Score of 0.  Cardiac etiology, CHF specifically, remains on the differential, and she is scheduled for an echo next week. Less likely an acute coronary event given the lack of chest pain.  Primary pulmonologic disease seems possible, especially given her rheumatologic history and could also explain her underlying DOE.  Discussed ER presentation and possible admission for time-sensitive workup and rule out of  cardiac and pulmonary processes, but she is adamantly opposed to this at present. Prefers an outpatient workup and voices understanding of strict ER precautions for any worsening in her symptoms or recurrence of her vertigo/nausea. She tells me that she plans to call 911 should she have any worsening in symptoms at all.  - STOP leflunomide. She will call her rheumatologist to tell of this probable drug reaction - BNP, CBC, TSH, CMP today  - Keep echo appointment next week - Strict  ER precautions reviewed and she voices understanding  - Have made an appointment for short-interval follow-up in our office on Monday 9/16  Dyspnea on exertion Reviewed CXR ordered by Dr. Dolan Amen last week. Has not yet had radiologist read, but there are no obvious abnormalities to my review. She is maintaining O2 sats of 99% on room air and with a normal work of breathing at rest. Agree that she needs further workup for causes such as CHF, COPD, and possible rheumatologic lung disease. Will almost certainly need a lung CT, but defer that at this time to allow time for leflunomide washout so that we have a clearer picture of what is going on.  - Follow up rads read of CXR - Echo next week - Further workup as above   J Dorothyann Gibbs, MD [facility] Medicine [facility]

## 2023-07-05 ENCOUNTER — Other Ambulatory Visit: Payer: Self-pay | Admitting: Family Medicine

## 2023-07-05 DIAGNOSIS — R058 Other specified cough: Secondary | ICD-10-CM

## 2023-07-05 LAB — COMPREHENSIVE METABOLIC PANEL
ALT: 18 IU/L (ref 0–32)
AST: 21 IU/L (ref 0–40)
Albumin: 4 g/dL (ref 3.8–4.8)
Alkaline Phosphatase: 135 IU/L — ABNORMAL HIGH (ref 44–121)
BUN/Creatinine Ratio: 14 (ref 12–28)
BUN: 10 mg/dL (ref 8–27)
Bilirubin Total: <0.2 mg/dL (ref 0.0–1.2)
CO2: 22 mmol/L (ref 20–29)
Calcium: 8.6 mg/dL — ABNORMAL LOW (ref 8.7–10.3)
Chloride: 103 mmol/L (ref 96–106)
Creatinine, Ser: 0.74 mg/dL (ref 0.57–1.00)
Globulin, Total: 3.7 g/dL (ref 1.5–4.5)
Glucose: 90 mg/dL (ref 70–99)
Potassium: 4.7 mmol/L (ref 3.5–5.2)
Sodium: 140 mmol/L (ref 134–144)
Total Protein: 7.7 g/dL (ref 6.0–8.5)
eGFR: 85 mL/min/{1.73_m2} (ref >59)

## 2023-07-05 LAB — CBC
Hematocrit: 35.7 % (ref 34.0–46.6)
Hemoglobin: 10.4 g/dL — ABNORMAL LOW (ref 11.1–15.9)
MCH: 23.8 pg — ABNORMAL LOW (ref 26.6–33.0)
MCHC: 29.1 g/dL — ABNORMAL LOW (ref 31.5–35.7)
MCV: 82 fL (ref 79–97)
Platelets: 222 10*3/uL (ref 150–450)
RBC: 4.37 x10E6/uL (ref 3.77–5.28)
RDW: 15.1 % (ref 11.7–15.4)
WBC: 8.1 10*3/uL (ref 3.4–10.8)

## 2023-07-05 LAB — TSH RFX ON ABNORMAL TO FREE T4: TSH: 1.5 u[IU]/mL (ref 0.450–4.500)

## 2023-07-05 LAB — BRAIN NATRIURETIC PEPTIDE: BNP: 49.9 pg/mL (ref 0.0–100.0)

## 2023-07-08 ENCOUNTER — Ambulatory Visit: Payer: Self-pay

## 2023-07-09 ENCOUNTER — Ambulatory Visit: Payer: Medicare Other | Admitting: *Deleted

## 2023-07-09 DIAGNOSIS — Z0000 Encounter for general adult medical examination without abnormal findings: Secondary | ICD-10-CM

## 2023-07-09 NOTE — Progress Notes (Signed)
Subjective:   [name] is a 72 y.o. female who presents for an Initial Medicare Annual Wellness Visit.  Visit Complete: Virtual  I connected with  Anabelle Vinas on 07/09/23 by a audio enabled telemedicine application and verified that I am speaking with the correct person using two identifiers.  Patient Location: Home  Provider Location: Home Office  I discussed the limitations of evaluation and management by telemedicine. The patient expressed understanding and agreed to proceed.  Vital Signs: Unable to obtain new vitals due to this being a telehealth visit.   Cardiac Risk Factors include: advanced age (>74men, >24 women);obesity (BMI >30kg/m2);hypertension;sedentary lifestyle     Objective:    Today's Vitals   07/09/23 1502  PainSc: 9    There is no height or weight on file to calculate BMI.     07/09/2023    3:04 PM 06/27/2023    1:57 PM 01/11/2023    2:34 PM 12/28/2022    2:08 PM 03/30/2022    4:31 PM 02/19/2022    3:43 PM 03/27/2021   10:18 AM  Advanced Directives  Does Patient Have a Medical Advance Directive? No No No No No No No  Would patient like information on creating a medical advance directive? No - Patient declined   No - Patient declined  No - Patient declined No - Patient declined    Current Medications (verified) Outpatient Encounter Medications as of 07/09/2023  Medication Sig   albuterol (VENTOLIN HFA) 108 (90 Base) MCG/ACT inhaler INHALE TWO PUFFS INTO THE LUNGS EVERY SIX (SIX) HOURS AS NEEDED FOR WHEEZING OR SHORTNESS OF BREATH.   alendronate (FOSAMAX) 70 MG tablet TAKE 1 TABLET BY MOUTH ONCE WEEKLY   aspirin EC 81 MG EC tablet Take 1 tablet (81 mg total) by mouth daily.   atorvastatin (LIPITOR) 80 MG tablet Take 1 tablet (80 mg total) by mouth daily.   carvedilol (COREG) 3.125 MG tablet Take 1 tablet (3.125 mg total) by mouth 2 (two) times daily with a meal.   citalopram (CELEXA) 40 MG tablet Take 1 tablet (40 mg total) by mouth daily.    clotrimazole (LOTRIMIN) 1 % cream Apply 1 application topically 2 (two) times daily. Under breasts   folic acid (FOLVITE) 1 MG tablet Take 1 tablet (1 mg total) by mouth daily.   gabapentin (NEURONTIN) 300 MG capsule TAKE THREE CAPSULES BY MOUTH TWICE DAILY   HYDROcodone-acetaminophen (NORCO) 10-325 MG tablet Take 1-2 tablets by mouth See admin instructions. 1 tablet in the morning and 2 tablets at night   leflunomide (ARAVA) 20 MG tablet Take 1 tablet (20 mg total) by mouth daily.   Multiple Vitamins-Minerals (MULTIVITAMIN WITH MINERALS) tablet Take 1 tablet by mouth daily.   naloxone (NARCAN) nasal spray 4 mg/0.1 mL SMARTSIG:Both Nares   omeprazole (PRILOSEC) 20 MG capsule Take 1 capsule (20 mg total) by mouth daily.   PHENObarbital (LUMINAL) 100 MG tablet Take 1 tablet (100 mg total) by mouth daily.   topiramate (TOPAMAX) 50 MG tablet Take 2 tablets (100 mg total) by mouth 2 (two) times daily.   triamcinolone ointment (KENALOG) 0.1 % Apply 1 Application topically 2 (two) times daily. Apply to front of your lower leg   No facility-administered encounter medications on file as of 07/09/2023.    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   COPD (chronic obstructive pulmonary disease) (HCC)    Depression    Ear pain, bilateral 03/30/2019   Fibromyalgia    08/18/15  [facility]   GERD (gastroesophageal reflux disease)    Headache 05/27/2013   History of CVA (cerebrovascular accident)    "11 minni srokes"   Hyperlipidemia    Hypertension    Migraine    Neck pain 02/21/2012   Personal history of colonic polyp - adenoma 03/01/2014   Personality disorder (HCC)     Productive cough 11/09/2020   Rash and nonspecific skin eruption 08/04/2020   Rheumatoid arthritis (HCC)    08/18/15  [facility]   Seizures (HCC)    last seizure 4 years ago   Sleep apnea    Mild. No need for CPAP.   Stroke [facility])    Past Surgical History:  Procedure Laterality Date   ANKLE SURGERY Left    APPENDECTOMY     BACK SURGERY     BUNIONECTOMY     2 toes   CARDIAC CATHETERIZATION  2008   Normal. Dr Elease Hashimoto.   CHOLECYSTECTOMY     CYST EXCISION     leg   ESOPHAGOGASTRODUODENOSCOPY (EGD) WITH PROPOFOL N/A 06/27/2018   Procedure: ESOPHAGOGASTRODUODENOSCOPY (EGD) WITH PROPOFOL;  Surgeon: Wyline Mood, MD;  Location[facility] ENDOSCOPY;  Service: Gastroenterology;  Laterality: N/A;   FLANK MASS EXCISION     FOOT TENDON SURGERY  june 2014   both feet   KNEE SURGERY     Bilateral.   NOSE SURGERY     SHOULDER SURGERY     bil.   TOTAL ABDOMINAL HYSTERECTOMY     Family History  Problem Relation Age of Onset   Cirrhosis Mother    Diabetes Mother    Cirrhosis Father    Breast cancer Sister 36   Seizures Sister    Breast cancer Sister 74   Migraines Sister    Colon cancer Neg Hx    Social History   Socioeconomic History   Marital status: Married    Spouse name: Clearence    Number of children: 1   Years of education: 11   Highest education level: Not on file  Occupational History   Occupation: Unemployed   Tobacco Use   Smoking status: Former    Passive exposure: Past   Smokeless tobacco: Never   Tobacco comments:    Started smoking age 37 quit in her 55s - quit alcohol at the same time.  Vaping Use   Vaping status: Never Used  Substance and Sexual Activity   Alcohol use: No   Drug use: No   Sexual activity: Not Currently  Other Topics Concern   Not on file  Social History Narrative   Patient lives at home with her husband [name].    Right Handed   Drinks no caffeine   Social Determinants of Health   Financial Resource Strain: Low Risk  (07/09/2023)   Overall  Financial Resource Strain (CARDIA)    Difficulty of Paying Living Expenses: Not hard at all  Food Insecurity: No Food Insecurity (07/09/2023)   Hunger Vital Sign    Worried About Running Out of Food in the Last Year: Never true    Ran Out of Food in the Last Year: Never true  Transportation Needs: No Transportation Needs (07/09/2023)   PRAPARE - Transportation  Lack of Transportation (Medical): No    Lack of Transportation (Non-Medical): No  Physical Activity: Inactive (07/09/2023)   Exercise Vital Sign    Days of Exercise per Week: 0 days    Minutes of Exercise per Session: 0 min  Stress: No Stress Concern Present (07/09/2023)   [employer] of Occupational Health - Occupational Stress Questionnaire    Feeling of Stress : Not at all  Social Connections: Not on file    Tobacco Counseling Counseling given: Not Answered Tobacco comments: Started smoking age 45 quit in her 42s - quit alcohol at the same time.   Clinical Intake:     Pain : 0-10 Pain Score: 9  Pain Type: Chronic pain Pain Location: Hand Pain Orientation: Left, Right Pain Descriptors / Indicators: Constant, Burning, Aching Pain Onset: More than a month ago Pain Frequency: Constant Pain Relieving Factors: hydrocodone  Pain Relieving Factors: hydrocodone  Diabetes: No  How often do you need to have someone help you when you read instructions, pamphlets, or other written materials from your doctor or pharmacy?: 1 - Never  Interpreter Needed?: No  Information entered by :: Remi Haggard LPN   Activities of Daily Living    07/09/2023    3:05 PM  In your present state of health, do you have any difficulty performing the following activities:  Hearing? 0  Vision? 0  Difficulty concentrating or making decisions? 0  Walking or climbing stairs? 1  Dressing or bathing? 0  Doing errands, shopping? 1  Preparing Food and eating ? N  Using the Toilet? N  In the past six months, have you accidently leaked  urine? N  Do you have problems with loss of bowel control? N  Managing your Medications? N  Managing your Finances? N  Housekeeping or managing your Housekeeping? Y    Patient Care Team: Lorayne Bender, MD as PCP - General Ang, Lynne Leader, MD as Referring Physician (Internal Medicine) Ang, Lynne Leader, MD as Referring Physician (Internal Medicine)  Indicate any recent Medical Services you may have received from other than [facility] providers in the past year (date may be approximate).     Assessment:   This is a routine wellness examination for Lynzie.  Hearing/Vision screen Hearing Screening - Comments:: No trouble hearing Vision Screening - Comments:: Up to date [facility]   Goals Addressed             This Visit's Progress    Patient Stated       Would like to get her joint pain under control       Depression Screen    07/09/2023    3:08 PM 06/27/2023    2:00 PM 02/15/2023    1:34 PM 01/11/2023    2:44 PM 01/02/2023    3:42 PM 12/28/2022    2:08 PM 02/19/2022    3:43 PM  PHQ 2/9 Scores  PHQ - 2 Score 4 1 0 1 6 6  0  PHQ- 9 Score 6 2  2 8 6  0    Fall Risk    07/09/2023    3:04 PM 06/27/2023    1:57 PM 12/28/2022    2:08 PM 06/09/2021    3:28 PM 03/27/2021   10:17 AM  Fall Risk   Falls in the past year? 0 0 0 1 0  Number falls in past yr: 0 0 0 0 0  Injury with Fall? 0 0 0 0 0  Risk for fall due to :    Impaired  balance/gait   Follow up Falls evaluation completed;Education provided;Falls prevention discussed        MEDICARE RISK AT HOME: Medicare Risk at Home Any stairs in or around the home?: No If so, are there any without handrails?: No Home free of loose throw rugs in walkways, pet beds, electrical cords, etc?: Yes Adequate lighting in your home to reduce risk of falls?: Yes Life alert?: No Use of a cane, walker or w/c?: Yes Grab bars in the bathroom?: Yes Shower chair or bench in shower?: Yes Elevated toilet seat or a handicapped toilet?: No  TIMED UP AND GO:  Was  the test performed? No    Cognitive Function:        07/09/2023    3:06 PM  6CIT Screen  What Year? 0 points  What month? 0 points  What time? 0 points  Count back from 20 0 points  Months in reverse 0 points  Repeat phrase 8 points  Total Score 8 points    Immunizations Immunization History  Administered Date(s) Administered   Fluad Quad(high Dose 65+) 08/03/2020, 12/28/2022   Fluad Trivalent(High Dose 65+) 06/27/2023   Influenza Split 08/21/2011   Influenza Whole 07/29/2007, 07/29/2008, 11/07/2009   Influenza,inj,Quad PF,6+ Mos 07/31/2013, 10/16/2018   Influenza-Unspecified 08/18/2015   PFIZER(Purple Top)SARS-COV-2 Vaccination 12/24/2019, 01/14/2020   Pneumococcal Conjugate-13 08/18/2015   Pneumococcal Polysaccharide-23 07/29/2007, 02/15/2011, 12/15/2015    TDap allergy  Flu Vaccine status: Up to date  Pneumococcal vaccine status: Up to date  Covid-19 vaccine status: Declined, Education has been provided regarding the importance of this vaccine but patient still declined. Advised may receive this vaccine at local pharmacy or Health Dept.or vaccine clinic. Aware to provide a copy of the vaccination record if obtained from local pharmacy or Health Dept. Verbalized acceptance and understanding.  Qualifies for Shingles Vaccine? Allergy unable to have  Zostavax completed No   Shingrix Completed?: No.    Education has been provided regarding the importance of this vaccine. Patient has been advised to call insurance company to determine out of pocket expense if they have not yet received this vaccine. Advised may also receive vaccine at local pharmacy or Health Dept. Verbalized acceptance and understanding.  Screening Tests Health Maintenance  Topic Date Due   COVID-19 Vaccine (3 - Pfizer risk series) 07/25/2023 (Originally 02/11/2020)   Zoster Vaccines- Shingrix (1 of 2) 10/08/2023 (Originally 08/25/1970)   DEXA SCAN  07/08/2024 (Originally 08/25/2014)   Colonoscopy   07/08/2024 (Originally 02/25/2019)   Medicare Annual Wellness (AWV)  07/08/2024   MAMMOGRAM  02/07/2025   Pneumonia Vaccine 84+ Years old  Completed   INFLUENZA VACCINE  Completed   Hepatitis C Screening  Completed   HPV VACCINES  Aged Out   DTaP/Tdap/Td  Discontinued    Health Maintenance  There are no preventive care reminders to display for this patient.   Colorectal cancer screening: Type of screening: Colonoscopy. Completed 2011. Repeat every 5 years Will call to when ready  to get done for a referral   Mammogram status: Completed  . Repeat every year  Bone Density -  Patient declined at this time  Lung Cancer Screening: (Low Dose CT Chest recommended if Age 29-80 years, 20 pack-year currently smoking OR have quit w/in 15years.) does not qualify.   Lung Cancer Screening Referral:   Additional Screening:  Hepatitis C Screening: does not qualify; Completed 2018  Vision Screening: Recommended annual ophthalmology exams for early detection of glaucoma and other disorders of the eye. Is the  patient up to date with their annual eye exam?  Yes  Who is the provider or what is the name of the office in which the patient attends annual eye exams? [facility] If pt is not established with a provider, would they like to be referred to a provider to establish care? No .   Dental Screening: Recommended annual dental exams for proper oral hygiene   Community Resource Referral / Chronic Care Management: CRR required this visit?  No   CCM required this visit?  No     Plan:     I have personally reviewed and noted the following in the patient's chart:   Medical and social history Use of alcohol, tobacco or illicit drugs  Current medications and supplements including opioid prescriptions. Patient is currently taking opioid prescriptions. Information provided to patient regarding non-opioid alternatives. Patient advised to discuss non-opioid treatment plan with their  provider. Functional ability and status Nutritional status Physical activity Advanced directives List of other physicians Hospitalizations, surgeries, and ER visits in previous 12 months Vitals Screenings to include cognitive, depression, and falls Referrals and appointments  In addition, I have reviewed and discussed with patient certain preventive protocols, quality metrics, and best practice recommendations. A written personalized care plan for preventive services as well as general preventive health recommendations were provided to patient.     Remi Haggard, LPN   12/27/6576   After Visit Summary: (Declined) Due to this being a telephonic visit, with patients personalized plan was offered to patient but patient Declined AVS at this time   Nurse Notes:

## 2023-07-10 ENCOUNTER — Other Ambulatory Visit: Payer: Self-pay

## 2023-07-10 DIAGNOSIS — G8929 Other chronic pain: Secondary | ICD-10-CM

## 2023-07-11 ENCOUNTER — Encounter: Payer: Self-pay | Admitting: Family Medicine

## 2023-07-11 ENCOUNTER — Ambulatory Visit: Payer: Medicare Other | Admitting: Family Medicine

## 2023-07-11 VITALS — BP 138/84 | HR 66 | Wt 206.0 lb

## 2023-07-11 DIAGNOSIS — I10 Essential (primary) hypertension: Secondary | ICD-10-CM

## 2023-07-11 DIAGNOSIS — R0609 Other forms of dyspnea: Secondary | ICD-10-CM

## 2023-07-11 DIAGNOSIS — R5381 Other malaise: Secondary | ICD-10-CM | POA: Diagnosis not present

## 2023-07-11 DIAGNOSIS — R5383 Other fatigue: Secondary | ICD-10-CM

## 2023-07-11 DIAGNOSIS — M069 Rheumatoid arthritis, unspecified: Secondary | ICD-10-CM

## 2023-07-11 DIAGNOSIS — M85859 Other specified disorders of bone density and structure, unspecified thigh: Secondary | ICD-10-CM

## 2023-07-11 DIAGNOSIS — G8929 Other chronic pain: Secondary | ICD-10-CM | POA: Diagnosis not present

## 2023-07-11 MED ORDER — GABAPENTIN 300 MG PO CAPS
ORAL_CAPSULE | ORAL | 2 refills | Status: DC
Start: 2023-07-11 — End: 2023-09-30

## 2023-07-11 NOTE — Assessment & Plan Note (Addendum)
Pt continues to endorse shortness of breath that is unchanged from last visit with me. Normal CXR is reassuring that etiology is not acute cardiopulmonary disease. PFTs not completed due to patient experiencing other symptoms on day of appt with Dr Raymondo Band. Ongoing workup includes echocardiogram which is scheduled for next week. --will follow up with patient after echo, has return appointment in 3 weeks, will schedule sooner if needed based on echo

## 2023-07-11 NOTE — Assessment & Plan Note (Signed)
Endorsed vertigo, nausea, weakness at previous visit thought to be secondary to Lefluonide medication given timing of medication initiation and symptom onset. Reassured by normal lab work, resolution of symptoms, hemodynamic stability, and lack of focal neurological deficits. --patient to call rheumatologist to discuss this possible drug reaction --continue to monitor

## 2023-07-11 NOTE — Progress Notes (Signed)
    SUBJECTIVE:   CHIEF COMPLAINT / HPI:   Follow up DOE -reviewed recent CXR, which was normal -echo scheduled for 9/24 -discontinued lefluonmide per last visit  Hypertension -taking coreg daily -checks pressure at home, looks about the same as what it is today -has history of stroke, motivated to keep BP well controlled  Osteopenia -DEXA in Jan 2020 thru [facility] showed 10 year probability of major osteoporotic fracture 10.5% and 1.7% risk for hip fracture -was taking Fosamax in the past but had N/V/diarrhea on it, has not been taking anything since  Reviewed medications and made changes/ordered refills as needed  PERTINENT  PMH / PSH: RA, HTN, chronic migraine, CVA, COPD, HLD  OBJECTIVE:   BP 138/84   Pulse 66   Wt 206 lb (93.4 kg)   SpO2 98%   BMI 34.28 kg/m     Physical Exam Gen: older female sitting in exam room in NAD, ambulates with cane CV: RRR, normal S1/S2, no murmurs, rubs, or gallops Pulm: CTAB, no crackles, wheezes, rales, or rhonchi, normal WOB Ext: no edema to BLE  ASSESSMENT/PLAN:   Essential hypertension, benign BP: 138/84 today. Poorly controlled. Goal of <130/80. Continue to work on healthy dietary habits and exercise. Follow up in 3 weeks consider increased coreg dose or adding additional agent pending echo results. Encouraged patient to continue checking BP at home and keep log for next appointment.  Medication regimen: carvedilol 3.125mg  daily   Malaise and fatigue Endorsed vertigo, nausea, weakness at previous visit thought to be secondary to Lefluonide medication given timing of medication initiation and symptom onset. Reassured by normal lab work, resolution of symptoms, hemodynamic stability, and lack of focal neurological deficits. --patient to call rheumatologist to discuss this possible drug reaction --continue to monitor  Dyspnea on exertion Pt continues to endorse shortness of breath that is unchanged from last visit with me. Normal  CXR is reassuring that etiology is not acute cardiopulmonary disease. PFTs not completed due to patient experiencing other symptoms on day of appt with Dr Raymondo Band. Ongoing workup includes echocardiogram which is scheduled for next week. --will follow up with patient after echo, has return appointment in 3 weeks, will schedule sooner if needed based on echo  Osteopenia of neck of femur Last DEXA in Jan 2020 with 10.5% risk of major osteoporotic fracture and 1.7% risk of hip fracture. Was taking fosamax for years, discontinued due to GI upset. Not currently taking steroids but remains at elevated risk of osteoporosis given underlying RA --discuss medication reaction further at next appt --consider repeat DEXA     Patient Instructions  Thank you for coming in today!  Things we discussed today: Please continue checking your blood pressure at home and try to keep a log of this for your next appointment. Once we have the information from the echocardiogram, we can talk about any other medications we may need to add. 2. Please call your rheumatologist to let them know about what we think was a reaction to the medication. 3. I will look into your Fosfamax medication and we can discuss this more next time.  I will see you in about 3 weeks.  Have a great day!  Lorayne Bender, MD [facility]

## 2023-07-11 NOTE — Assessment & Plan Note (Signed)
Last DEXA in Jan 2020 with 10.5% risk of major osteoporotic fracture and 1.7% risk of hip fracture. Was taking fosamax for years, discontinued due to GI upset. Not currently taking steroids but remains at elevated risk of osteoporosis given underlying RA --discuss medication reaction further at next appt --consider repeat DEXA

## 2023-07-11 NOTE — Assessment & Plan Note (Signed)
BP: 138/84 today. Poorly controlled. Goal of <130/80. Continue to work on healthy dietary habits and exercise. Follow up in 3 weeks consider increased coreg dose or adding additional agent pending echo results. Encouraged patient to continue checking BP at home and keep log for next appointment.  Medication regimen: carvedilol 3.125mg  daily

## 2023-07-11 NOTE — Patient Instructions (Addendum)
Thank you for coming in today!  Things we discussed today: Please continue checking your blood pressure at home and try to keep a log of this for your next appointment. Once we have the information from the echocardiogram, we can talk about any other medications we may need to add. 2. Please call your rheumatologist to let them know about what we think was a reaction to the medication. 3. I will look into your Fosfamax medication and we can discuss this more next time.  I will see you in about 3 weeks.  Have a great day!

## 2023-07-15 ENCOUNTER — Other Ambulatory Visit: Payer: Self-pay | Admitting: Neurology

## 2023-07-16 ENCOUNTER — Ambulatory Visit (HOSPITAL_COMMUNITY)
Admission: RE | Admit: 2023-07-16 | Discharge: 2023-07-16 | Disposition: A | Discharge status: Home / Self Care | Payer: Medicare Other | Source: Ambulatory Visit | Attending: Family Medicine | Admitting: Family Medicine

## 2023-07-16 ENCOUNTER — Encounter: Payer: Self-pay | Admitting: Family Medicine

## 2023-07-16 DIAGNOSIS — R0609 Other forms of dyspnea: Secondary | ICD-10-CM | POA: Insufficient documentation

## 2023-07-16 LAB — ECHOCARDIOGRAM COMPLETE
AR max vel: 2.21 cm2
AV Area VTI: 2.02 cm2
AV Area mean vel: 1.64 cm2
AV Mean grad: 4 mmHg
AV Peak grad: 5.3 mmHg
Ao pk vel: 1.15 m/s
Area-P 1/2: 3.19 cm2
S' Lateral: 3.6 cm

## 2023-07-25 ENCOUNTER — Ambulatory Visit (INDEPENDENT_AMBULATORY_CARE_PROVIDER_SITE_OTHER): Payer: Medicare Other | Admitting: Pharmacist

## 2023-07-25 ENCOUNTER — Encounter: Payer: Self-pay | Admitting: Pharmacist

## 2023-07-25 VITALS — BP 105/65 | HR 66 | Ht 65.0 in | Wt 214.0 lb

## 2023-07-25 DIAGNOSIS — R0609 Other forms of dyspnea: Secondary | ICD-10-CM

## 2023-07-25 MED ORDER — BUDESONIDE-FORMOTEROL FUMARATE 80-4.5 MCG/ACT IN AERO
2.0000 | INHALATION_SPRAY | Freq: Two times a day (BID) | RESPIRATORY_TRACT | 3 refills | Status: DC
Start: 2023-07-25 — End: 2023-11-14

## 2023-07-25 NOTE — Patient Instructions (Addendum)
Nice to see you today! Lung function today showed small decrease in function with significant reversibility   Medication Changes: START Symbicort 2 puffs twice daily and as needed for shortness of breath  Continue all other medication the same.

## 2023-07-25 NOTE — Progress Notes (Signed)
   S:       Chief Complaint  Patient presents with   Medication Management    Spirometry, PFT, dyspnea   72 y.o. female who presents for diabetes evaluation, education, and management. Patient arrives in good spirits and presents with a cane for assistance. Patient is accompanied by sister, Britta Mccreedy.   Patient was referred and last seen by Primary Care Provider, Dr. Dolan Amen, on 07/07/2023.   PMH is significant for history of ED visits for SOB in 2022 and 2023.   Patient reports breathing has been "okay" but gets short of breath easily. Reports frequent wheezing and coughing occasionally.    Patient has albuterol inhaler, reports use once daily, has not taken today. The inhaler does not help with wheezing.   O: Review of Systems  Respiratory:  Positive for cough, shortness of breath and wheezing.   Musculoskeletal:  Positive for joint pain.    Physical Exam Constitutional:      Appearance: Normal appearance.  Pulmonary:     Effort: Pulmonary effort is normal.  Neurological:     Mental Status: She is alert.  Psychiatric:        Mood and Affect: Mood normal.        Behavior: Behavior normal.        Thought Content: Thought content normal.        Judgment: Judgment normal.     Vitals:   07/25/23 1504  BP: 105/65  Pulse: 66  SpO2: 100%    mMRC score=  >2 CAT score= 18 See Documentation Flowsheet - CAT/COPD for complete symptom scoring.  See "scanned report" or Documentation Flowsheet (discrete results - PFTs) for Spirometry results. Patient provided good effort while attempting spirometry.   Lung Age = 89 Albuterol Neb  Lot# [id number]       Exp. April 2025  Patient is participating in a Managed Medicaid Plan:  Yes   A/P: Patient has been experiencing dyspnea for "a long time" and taking prn albuterol once daily. Spirometry evaluation with pre- and post-bronchodilator reveals reduced lung function with reversibility of FEV1 by 13%. Patient is symptomatic with CAT  score of 18.  - Initiated a trial of Symbicort (formoterol, budesonide) 2 puffs BID and prn  Reviewed inhaler technique, following instruction Patient verbalized understanding use.  -Educated patient on purpose, proper use, potential adverse effects including risk of esophageal candidiasis and need to rinse mouth after each use.   -Reviewed results of pulmonary function tests.  Patient and sister verbalized understanding of results and education.    Written patient instructions provided.   Total time in face to face counseling 47 minutes.    Follow-up:  Pharmacist PRN PCP clinic visit 08/02/2023 Dr. Dolan Amen Patient seen with Caprice Beaver, PharmD Candidate and Shona Simpson, PharmD Candidate.

## 2023-07-25 NOTE — Assessment & Plan Note (Signed)
Patient has been experiencing dyspnea for "a long time" and taking prn albuterol once daily. Spirometry evaluation with pre- and post-bronchodilator reveals reduced lung function with reversibility of FEV1 by 13%. Patient is symptomatic with CAT score of 18.  - Initiated a trial of Symbicort (formoterol, budesonide) 2 puffs BID and prn  Reviewed inhaler technique, following instruction Patient verbalized understanding use.  -Educated patient on purpose, proper use, potential adverse effects including risk of esophageal candidiasis and need to rinse mouth after each use.   -Reviewed results of pulmonary function tests.  Patient and sister verbalized understanding of results and education.

## 2023-07-29 NOTE — Progress Notes (Signed)
Reviewed and agree with Dr Koval's plan.   

## 2023-07-31 ENCOUNTER — Other Ambulatory Visit: Payer: Self-pay | Admitting: Family Medicine

## 2023-07-31 DIAGNOSIS — G6289 Other specified polyneuropathies: Secondary | ICD-10-CM

## 2023-08-02 ENCOUNTER — Ambulatory Visit: Payer: Self-pay | Admitting: Family Medicine

## 2023-08-05 NOTE — Progress Notes (Signed)
Office Visit Note  Patient: [name]             Date of Birth: 1951/02/20           MRN: [id number]             PCP: Lorayne Bender, MD Referring: Lorayne Bender, MD Visit Date: 08/19/2023   Subjective:  Follow-up (Patient states she is not taking the leflunomide because it made her really sick. )   History of Present Illness: [name] is a 72 y.o. female here for follow up for seropositive erosive rheumatoid arthritis.  We started on leflunomide 20 mg daily after last visit but she had to stop this due to pretty severe GI intolerance.  So far on no RA treatment she is having a lot of joint problems with pain and swelling in the left hand and wrist and decreased grip function on both hands.  Staying stiff for several hours daily.  Previous HPI 06/18/2023 [name] is a 72 y.o. female here for follow up for seropositive erosive rheumatoid arthritis.  Lab testing initial visit showed mildly elevated sedimentation rate 34 however this is better than previous labs reviewed preceding months.  Changes consistent with longstanding erosive disease extensive throughout MTP joints.  Chest x-ray was normal.  She did not feel a very great relief in symptoms with the course of oral prednisone.  Still has pain and stiffness and swelling of multiple joints but especially tenderness with any firm pressure or with use.   Previous HPI 05/28/23 [name] is a 72 y.o. female here for evaluation management of seropositive erosive rheumatoid arthritis.  Original diagnosis in 2016 with joint pain and swelling in multiple areas including both elbows hands and right knee.  Initial treatments include methotrexate and prednisone.  Subsequently tried increase in methotrexate dose and switching to subcutaneous route with only partial disease response.  She was started on Humira in January 2019 with some disease response subsequently increased to weekly dosing in August 2019.  This was then disrupted and  not following regularly starting due to coronavirus.  Off RA medication her worst problem is pain and swelling in the left hand.  She is also noticing some progressive worsening of changes at the MCP joints and lateral deviation in her fingers.  However she is having pretty much widespread pain throughout upper and lower extremities.  She takes gabapentin 300 mg twice daily and has been on chronic low-dose hydrocodone and back injections treatments used for chronic pain.     Labs reviewed 10/2017 HBV neg HCV neg   Imaging reviewed  10/2018 DEXA with osteopenia - FRAX 19%, 3.6%   Review of Systems  Constitutional:  Positive for fatigue.  HENT:  Negative for mouth sores and mouth dryness.   Eyes:  Negative for dryness.  Respiratory:  Positive for shortness of breath.   Cardiovascular:  Negative for chest pain and palpitations.  Gastrointestinal:  Negative for blood in stool, constipation and diarrhea.  Endocrine: Negative for increased urination.  Genitourinary:  Negative for involuntary urination.  Musculoskeletal:  Positive for joint pain, joint pain, joint swelling, myalgias, muscle weakness, morning stiffness, muscle tenderness and myalgias. Negative for gait problem.  Skin:  Negative for color change, rash, hair loss and sensitivity to sunlight.  Allergic/Immunologic: Positive for susceptible to infections.  Neurological:  Positive for headaches. Negative for dizziness.  Hematological:  Negative for swollen glands.  Psychiatric/Behavioral:  Positive for depressed mood. Negative for sleep disturbance. The patient is not  nervous/anxious.     PMFS History:  Patient Active Problem List   Diagnosis Date Noted   Malaise and fatigue 07/04/2023   High risk medication use 05/28/2023   Thyroid nodule 12/31/2022   Syncope and collapse 02/28/2021   Peripheral neuropathy 01/03/2021   Dyspnea on exertion 08/04/2020   CVA (cerebral vascular accident) (HCC) 09/09/2019   Osteopenia of neck of  femur 02/05/2019   Coronary artery disease 10/16/2018   Hypersomnia with sleep apnea 04/18/2017   Post traumatic stress disorder (PTSD) 04/18/2017   COPD mixed type (HCC) 04/18/2017   Fibromyalgia 08/18/2015   Rheumatoid arthritis (HCC) 03/09/2015   Essential hypertension, benign 02/18/2015   Cervical disc disorder with radiculopathy of cervical region 01/21/2015   History of colonic polyps 03/01/2014   Dyshidrotic eczema 12/29/2013   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   Chronic migraine without aura, with intractable migraine, so stated, with status migrainosus 07/29/2007   DEPENDENCE, BARB/SED, CONTINUOUS 06/24/2007   HYPERLIPIDEMIA 12/19/2006   OBESITY, NOS 12/19/2006   Depression with anxiety 12/19/2006   PANIC ATTACKS 12/19/2006   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   COPD (chronic obstructive pulmonary disease) (HCC)    Depression    Ear pain, bilateral 03/30/2019   Fibromyalgia    08/18/15  [facility]   GERD (gastroesophageal reflux disease)    Headache 05/27/2013   History of CVA (cerebrovascular accident)    "11 minni srokes"   Hyperlipidemia    Hypertension    Migraine    Neck pain 02/21/2012   Personal history of colonic polyp - adenoma 03/01/2014   Personality disorder (HCC)    Productive cough 11/09/2020   Rash and nonspecific skin eruption 08/04/2020   Rheumatoid arthritis (HCC)    08/18/15  [facility]   Seizures (HCC)    last seizure 4 years ago   Sleep apnea    Mild. No need for CPAP.   Stroke [facility])     Family History  Problem Relation Age of Onset   Cirrhosis Mother    Diabetes Mother    Cirrhosis Father    Breast cancer Sister 88   Seizures  Sister    Breast cancer Sister 110   Migraines Sister    Colon cancer Neg Hx    Past Surgical History:  Procedure Laterality Date   ANKLE SURGERY Left    APPENDECTOMY     BACK SURGERY     BUNIONECTOMY     2 toes   CARDIAC CATHETERIZATION  2008   Normal. Dr Elease Hashimoto.   CHOLECYSTECTOMY     CYST EXCISION     leg   ESOPHAGOGASTRODUODENOSCOPY (EGD) WITH PROPOFOL N/A 06/27/2018   Procedure: ESOPHAGOGASTRODUODENOSCOPY (EGD) WITH PROPOFOL;  Surgeon: Wyline Mood, MD;  Location[facility] END[facility]COPY;  Service: Gastroenterology;  Laterality: N/A;   FLANK MASS EXCISION     FOOT TENDON SURGERY  june 2014   both feet   KNEE SURGERY     Bilateral.   NOSE SURGERY     SHOULDER SURGERY     bil.   TOTAL ABDOMINAL HYSTERECTOMY     Social History   Social History Narrative   Patient lives at home with  her husband Marilu Favre.    Right Handed   Drinks no caffeine   Immunization History  Administered Date(s) Administered   Fluad Quad(high Dose 65+) 08/03/2020, 12/28/2022   Fluad Trivalent(High Dose 65+) 06/27/2023   Influenza Split 08/21/2011   Influenza Whole 07/29/2007, 07/29/2008, 11/07/2009   Influenza,inj,Quad PF,6+ Mos 07/31/2013, 10/16/2018   Influenza-Unspecified 08/18/2015   PFIZER(Purple Top)SARS-COV-2 Vaccination 12/24/2019, 01/14/2020   Pneumococcal Conjugate-13 08/18/2015   Pneumococcal Polysaccharide-23 07/29/2007, 02/15/2011, 12/15/2015     Objective: Vital Signs: BP 136/84 (BP Location: Right Arm, Patient Position: Sitting, Cuff Size: Normal)   Pulse (!) 56   Resp 14   Ht 5\' 5"  (1.651 m)   Wt 215 lb (97.5 kg)   BMI 35.78 kg/m    Physical Exam Eyes:     Conjunctiva/sclera: Conjunctivae normal.  Cardiovascular:     Rate and Rhythm: Normal rate and regular rhythm.  Pulmonary:     Effort: Pulmonary effort is normal.     Breath sounds: Normal breath sounds.  Lymphadenopathy:     Cervical: No cervical adenopathy.  Skin:    General: Skin is warm and dry.  Neurological:      Mental Status: She is alert.  Psychiatric:        Mood and Affect: Mood normal.      Musculoskeletal Exam:  Shoulders full ROM intact but pain with active abduction horizontal on above, no palpable swelling Elbows full ROM no tenderness or swelling Left wrist swelling, tenderness to pressure, mildly restricted flexion and extension Tenderness throughout MCP joints on both hands and hip flexion range of motion is restricted left more than right Knees full ROM no tenderness or swelling, bilateral patellofemoral crepitus   Investigation: No additional findings.  Imaging: No results found.  Recent Labs: Lab Results  Component Value Date   WBC 8.1 07/04/2023   HGB 10.4 (L) 07/04/2023   PLT 222 07/04/2023   NA 140 07/04/2023   K 4.7 07/04/2023   CL 103 07/04/2023   CO2 22 07/04/2023   GLUCOSE 90 07/04/2023   BUN 10 07/04/2023   CREATININE 0.74 07/04/2023   BILITOT <0.2 07/04/2023   ALKPHOS 135 (H) 07/04/2023   AST 21 07/04/2023   ALT 18 07/04/2023   PROT 7.7 07/04/2023   ALBUMIN 4.0 07/04/2023   CALCIUM 8.6 (L) 07/04/2023   GFRAA 93 07/28/2020   QFTBGOLDPLUS NEGATIVE 05/28/2023    Speciality Comments: No specialty comments available.  Procedures:  No procedures performed Allergies: Tetanus toxoids, Alendronate, Codeine phosphate, Flexeril [cyclobenzaprine], Ibuprofen, and Penicillins   Assessment / Plan:     Visit Diagnoses: Rheumatoid arthritis involving shoulder, unspecified laterality, unspecified whether rheumatoid factor present (HCC) - Plan: predniSONE (DELTASONE) 10 MG tablet  Has clear disease activity on exam currently did not tolerate leflunomide well at all.  Recommend we plan to resume Enbrel 50 mg subcu weekly which she has taken in the past for RA.  Will also send short-term prednisone taper for current flareup tapering down from 30 mg to off over 15 days.  High risk medication use  Baseline labs were fine for starting TNF inhibitor.  Reviewed risk of  medication including injection site reactions, infections, and malignancy with long-term use.  Does not have a personal history of congestive heart failure.  Other polyneuropathy Fibromyalgia  On gabapentin 300 mg twice daily and hydrocodone 10 mg 3 times daily stable pain regimen for quite some time now.  Some of her widespread pain may be more related to peripheral neuropathy and chronic  pain syndrome but worse right now due to the uncontrolled RA.  Orders: No orders of the defined types were placed in this encounter.  Meds ordered this encounter  Medications   predniSONE (DELTASONE) 10 MG tablet    Sig: Take 3 tablets (30 mg total) by mouth daily with breakfast for 5 days, THEN 2 tablets (20 mg total) daily with breakfast for 5 days, THEN 1 tablet (10 mg total) daily with breakfast for 5 days.    Dispense:  30 tablet    Refill:  0     Follow-Up Instructions: Return in about 3 months (around 11/19/2023) for RA/FMS ENB start f/u 3mos.   Fuller Plan, MD  Note - This record has been created using [employer].  Chart creation errors have been sought, but may not always  have been located. Such creation errors do not reflect on  the standard of medical care.

## 2023-08-08 ENCOUNTER — Ambulatory Visit: Payer: Self-pay | Admitting: Family Medicine

## 2023-08-15 ENCOUNTER — Ambulatory Visit: Payer: Medicare Other | Admitting: Family Medicine

## 2023-08-15 ENCOUNTER — Other Ambulatory Visit: Payer: Self-pay

## 2023-08-15 ENCOUNTER — Encounter: Payer: Self-pay | Admitting: Family Medicine

## 2023-08-15 VITALS — BP 124/74 | HR 69 | Ht 65.0 in | Wt 211.4 lb

## 2023-08-15 DIAGNOSIS — I10 Essential (primary) hypertension: Secondary | ICD-10-CM

## 2023-08-15 DIAGNOSIS — F418 Other specified anxiety disorders: Secondary | ICD-10-CM | POA: Diagnosis present

## 2023-08-15 DIAGNOSIS — R21 Rash and other nonspecific skin eruption: Secondary | ICD-10-CM

## 2023-08-15 DIAGNOSIS — R0609 Other forms of dyspnea: Secondary | ICD-10-CM | POA: Diagnosis not present

## 2023-08-15 MED ORDER — TRIAMCINOLONE ACETONIDE 0.1 % EX OINT
1.0000 | TOPICAL_OINTMENT | Freq: Two times a day (BID) | CUTANEOUS | 1 refills | Status: DC
Start: 2023-08-15 — End: 2024-08-04

## 2023-08-15 MED ORDER — LEFLUNOMIDE 20 MG PO TABS: 20.0000 mg | ORAL_TABLET | Freq: Every day | ORAL | 0 refills | Status: DC

## 2023-08-15 MED ORDER — CLOTRIMAZOLE 1 % EX CREA
1.0000 | TOPICAL_CREAM | Freq: Two times a day (BID) | CUTANEOUS | 1 refills | Status: AC
Start: 2023-08-15 — End: ?

## 2023-08-15 MED ORDER — CITALOPRAM HYDROBROMIDE 40 MG PO TABS
40.0000 mg | ORAL_TABLET | Freq: Every day | ORAL | 3 refills | Status: DC
Start: 2023-08-15 — End: 2023-12-10

## 2023-08-15 NOTE — Assessment & Plan Note (Addendum)
Symptoms appear to be worsening as patient is still dealing with grief around the passing of her daughter last year and the upcoming anniversary of this.  Patient has been taking Celexa 40 mg and declines increasing this medication or switching at this time.  Refilled this for her.  Provided information for [facility], though patient states "I am not going to go there."  Provided with psychiatry resources though patient stated she was not interested in counseling. No active SI currently but does have a history of prior attempts and access to sedating medications so this is a concern.  Will plan to schedule several short interval follow-up visits to further discuss patient's mood and offer therapeutic listening, counseling resources, and medication adjustments as desired.

## 2023-08-15 NOTE — Patient Instructions (Signed)
It was good to see you today!  I appreciate you opening up to me about your mood. I have sent in a refill of the Celexa- we can discuss increasing the dose of this or switching to a different medicine in the future. I have also included psychiatry resources below just in case.  If you start having thoughts of hurting yourself, please call 911 or go to the Behavioral Health Urgent Care at [address] in [place]. Their phone number is [phone].  I will see you for your next appointment in 3 weeks.  Have a great day!    Psychiatry Resource List (Adults and Children) Most of these providers will take Medicaid. please consult your insurance for a complete and updated list of available providers. When calling to make an appointment have your insurance information available to confirm you are covered.   [facility]iatry and Counseling [address] [place], [place] [address] [phone]  [facility]  [address] [place], [place] [phone] Crisis [phone]   [facility]:   [facility]: [address]     [phone]   [facility]: [address] [place]. [place],        [phone] [place]: [address] [phone],    [phone] [place]: [phone] [address],                   [phone] Children: [facility] [address]         [phone]  [facility] (virtual only) [phone]   [facility]  (Psychiatry only; Adults /children 12 and over, will take Medicaid)  [address] [place] Josephs [address], [place], [place] [address]       [phone]   [facility] (Psychiatry & counseling ; adults & children ; will take Medicaid [address]  [place] [address]  Go on-line to complete referral ( [email or web address] [phone]    (Spanish speaking therapists)  [facility]  Psychiatry & counseling; Adults and children;  Call Registration prior to scheduling an appointment [phone] [address]    [place], [place] [address]    [phone]  [facility] (Psychiatry & counseling; adults & children; Medicare no Medicaid)  [address]   [place], [place]  [address]      [phone]    [facility] (up to age 46)  Psychiatry & counseling ,will take Medicaid, must do counseling to receive psychiatry services  [address] [place] [address]        [phone]  [facility] (Psychiatry & counseling; adults & children; will take Medicaid) Will need a referral from provider [address],  [place], [place]  [phone]   [facility] --- Walk-In Mon-Friday 8am-3pm ( will take Medicaid, Psychiatry, Adults & children,  [address], [place], [place]   [phone]   [facility] [place], Walk-in M-F 8am-12pm and 1pm -3pm   (Counseling, Psychiatry, will take Medicaid, adults & children)  [address], [place], [place]  [phone]

## 2023-08-15 NOTE — Assessment & Plan Note (Signed)
Improved with symbicort. Will continue to monitor.

## 2023-08-15 NOTE — Progress Notes (Signed)
SUBJECTIVE:   CHIEF COMPLAINT / HPI:    Dyspnea -met with Dr Raymondo Band and started Symbicort 2 puffs BID and PRN in early Oct, states this has been helping somewhat -reviewed normal echo and CXR results -has a follow up appt with rheum on Monday  HTN -pressures at home have been good, similar to the reading today -taking medication daily  Depression Patient states she has been having a very hard time recently, states her appetite and sleep have been very poor.  States that her daughter passed away about a year ago and she has never gotten over this.  Becomes tearful, states that her daughter was her best friend and that she finds it hard to even look at pictures of her.  She also states that her husband has been having health issues after recent back injury and that she is having trouble managing all of her health needs as well as his.  Her sisters help her get to doctors appointments and she says she talks to them about her mood sometimes but often prefers to be alone.  States she does not want to spend Thanksgiving or Christmas with her family as this will just remind her of her daughter.  Denies thoughts of self-harm but states she did have 2 prior suicide attempts after her mother died years ago.  Declines resources for counseling.  Has been taking Celexa 40 mg, declines increasing dose or considering new medication to help with her mood.  PERTINENT  PMH / PSH: RA, depression, anxiety, generalized epilepsy, COPD, CVA  OBJECTIVE:   BP 124/74   Pulse 69   Ht 5\' 5"  (1.651 m)   Wt 211 lb 6.4 oz (95.9 kg)   SpO2 98%   BMI 35.18 kg/m     Physical exam - General: No acute distress. Awake and conversant. - Eyes: Normal conjunctiva, anicteric. Round symmetric pupils. - ENT: Hearing grossly intact.  - Neck: Neck is supple. No masses or thyromegaly. - Respiratory: Respirations are non-labored. - Skin: No visible rashes or ulcers. - Psych: Alert and oriented. Cooperative, tearful and  dysthymic affect. No SI/HI. Not responding to external stimuli - MSK: ambulates with cane - Neuro: Sensation and CN II-XII grossly normal.   ASSESSMENT/PLAN:   Depression with anxiety Symptoms appear to be worsening as patient is still dealing with grief around the passing of her daughter last year and the upcoming anniversary of this.  Patient has been taking Celexa 40 mg and declines increasing this medication or switching at this time.  Refilled this for her.  Provided information for [facility], though patient states "I am not going to go there."  Provided with psychiatry resources though patient stated she was not interested in counseling. No active SI currently but does have a history of prior attempts and access to sedating medications so this is a concern.  Will plan to schedule several short interval follow-up visits to further discuss patient's mood and offer therapeutic listening, counseling resources, and medication adjustments as desired.  Essential hypertension, benign BP: 124/74 today. Well controlled. Goal of <130/80.   Medication regimen: coreg 3.125 BID   Dyspnea on exertion Improved with symbicort. Will continue to monitor.     Patient Instructions  It was good to see you today!  I appreciate you opening up to me about your mood. I have sent in a refill of the Celexa- we can discuss increasing the dose of this or switching to a different medicine in the future. I have  also included psychiatry resources below just in case.  If you start having thoughts of hurting yourself, please call 911 or go to the Behavioral Health Urgent Care at [address] in [place]. Their phone number is [phone].  I will see you for your next appointment in 3 weeks.  Have a great day!    Psychiatry Resource List (Adults and Children) Most of these providers will take Medicaid. please consult your insurance for a complete and updated list of available providers. When calling to make an  appointment have your insurance information available to confirm you are covered.   [facility]iatry and Counseling [address] [place], [place] [address] [phone]  [facility]  [address] [place], [place] [phone] Crisis [phone]   [facility]:   [facility]: [address]     [phone]   [facility]: [address] [place]. [place],        [phone] [place]: [phone],    [phone] [place]: [phone] [address],                   [phone] Children: [facility] [address]         [phone]  [facility] (virtual only) [phone]   [facility]  (Psychiatry only; Adults /children 12 and over, will take Medicaid)  [address] [place] Josephs [address], [place], [place] [address]       [phone]   [facility] (Psychiatry & counseling ; adults & children ; will take Medicaid [address]  [place] [address]  Go on-line to complete referral ( [email or web address] [phone]    (Spanish speaking therapists)  [facility]  Psychiatry & counseling; Adults and children;  Call Registration prior to scheduling an appointment [phone] [address]    [place], [place] [address]    [phone]  [facility] (Psychiatry & counseling; adults & children; Medicare no Medicaid)  [address]   [place], [place]  [address]      [phone]    [facility] (up to age 30)  Psychiatry & counseling ,will take Medicaid, must do counseling to receive psychiatry services  [address] [place] [address]        [phone]  [facility] (Psychiatry & counseling; adults & children; will take Medicaid) Will need a referral from provider [address],  [place], [place]   [phone]   [facility] --- Walk-In Mon-Friday 8am-3pm ( will take Medicaid, Psychiatry, Adults & children,  [address], [place], [place]   [phone]   [facility] [place], Walk-in M-F 8am-12pm and 1pm -3pm   (Counseling, Psychiatry, will take Medicaid, adults & children)  [address], [place], [place]  [phone]     Lorayne Bender, MD [facility]

## 2023-08-15 NOTE — Assessment & Plan Note (Signed)
BP: 124/74 today. Well controlled. Goal of <130/80.   Medication regimen: coreg 3.125 BID

## 2023-08-15 NOTE — Telephone Encounter (Signed)
Refill request received via fax from [facility] for [place]  Last Fill: 06/25/2023, was discontinued by Kathrin Ruddy, RPH-CPP on 07/25/2023  Labs: 07/04/2023 Hemoglobin 10.4 MCH 23.8 MCHC 29.1 Calcium 8.6 Alkaline Phosphatase 135  Next Visit: 08/19/2023  Last Visit: 06/18/2023  DX: Rheumatoid arthritis involving shoulder, unspecified laterality, unspecified whether rheumatoid factor present   Current Dose per office note 06/18/2023: leflunomide 20 mg daily.   Had to add the medication because it was discontinued. Please ensure prescription is correct.   Okay to refill Arava ?

## 2023-08-19 ENCOUNTER — Encounter: Payer: Self-pay | Admitting: Internal Medicine

## 2023-08-19 ENCOUNTER — Ambulatory Visit: Payer: Medicare Other | Attending: Internal Medicine | Admitting: Internal Medicine

## 2023-08-19 ENCOUNTER — Telehealth: Payer: Self-pay | Admitting: Pharmacist

## 2023-08-19 VITALS — BP 136/84 | HR 56 | Resp 14 | Ht 65.0 in | Wt 215.0 lb

## 2023-08-19 DIAGNOSIS — G6289 Other specified polyneuropathies: Secondary | ICD-10-CM | POA: Diagnosis not present

## 2023-08-19 DIAGNOSIS — M069 Rheumatoid arthritis, unspecified: Secondary | ICD-10-CM | POA: Diagnosis present

## 2023-08-19 DIAGNOSIS — M797 Fibromyalgia: Secondary | ICD-10-CM

## 2023-08-19 DIAGNOSIS — Z79899 Other long term (current) drug therapy: Secondary | ICD-10-CM

## 2023-08-19 MED ORDER — PREDNISONE 10 MG PO TABS
ORAL_TABLET | ORAL | 0 refills | Status: AC
Start: 2023-08-19 — End: 2023-09-03

## 2023-08-19 NOTE — Patient Instructions (Signed)
Etanercept Injection What is this medication? ETANERCEPT (et a [employer]) treats autoimmune conditions, such as psoriasis and certain types of arthritis. It works by slowing down an overactive immune system. It belongs to a group of medications called TNF inhibitors. This medicine may be used for other purposes; ask your health care provider or pharmacist if you have questions. COMMON BRAND NAME(S): Enbrel What should I tell my care team before I take this medication? They need to know if you have any of these conditions: Bleeding disorder Cancer Diabetes Granulomatosis with polyangiitis Heart failure HIV or AIDS Immune system problems Infection, such as tuberculosis (TB) or other bacterial, fungal or viral infections Liver disease Nervous system problems, such as Guillain-Barre syndrome, multiple sclerosis or seizures Recent or upcoming vaccine An unusual or allergic reaction to etanercept, other medications, food, dyes, or preservatives Pregnant or trying to get pregnant Breastfeeding How should I use this medication? The medication is injected under the skin. You will be taught how to prepare and give it. Take it as directed on the prescription label. Keep taking it unless your care team tells you stop. This medication comes with INSTRUCTIONS FOR USE. Ask your pharmacist for directions on how to use this medication. Read the information carefully. Talk to your pharmacist or care team if you have questions. If you use a pen, be sure to take off the outer needle cover before using the dose. It is important that you put your used needles and syringes in a special sharps container. Do not put them in a trash can. If you do not have a sharps container, call your pharmacist or care team to get one. A special MedGuide will be given to you by the pharmacist with each prescription and refill. Be sure to read this information carefully each time. Talk to your care team about the use of this  medication in children. While it may be prescribed for children as young as 84 years of age for selected conditions, precautions do apply. Overdosage: If you think you have taken too much of this medicine contact a poison control center or emergency room at once. NOTE: This medicine is only for you. Do not share this medicine with others. What if I miss a dose? If you miss a dose, take it as soon as you can. If it is almost time for your next dose, take only that dose. Do not take double or extra doses. What may interact with this medication? Do not take this medication with any of the following: Biologic medications, such as adalimumab, certolizumab, golimumab, infliximab Live vaccines Rilonacept This medication may also interact with the following: Abatacept [place] Biologic medications, such as anifrolumab, baricitinib, belimumab, canakinumab, natalizumab, rituximab, sarilumab, tocilizumab, tofacitinib, upadacitinib, vedolizumab Cyclophosphamide Sulfasalazine This list may not describe all possible interactions. Give your health care provider a list of all the medicines, herbs, non-prescription drugs, or dietary supplements you use. Also tell them if you smoke, drink alcohol, or use illegal drugs. Some items may interact with your medicine. What should I watch for while using this medication? Visit your care team for regular checks on your progress. Tell your care team if your symptoms do not start to get better or if they get worse. This medication may increase your risk of getting an infection. Call your care team for advice if you get a fever, chills, sore throat, or other symptoms of a cold or flu. Do not treat yourself. Try to avoid being around people who are sick. If  you have not had the measles or chickenpox vaccines, tell your care team right away if you are around someone with these viruses. You will be tested for tuberculosis (TB) before you start this medication. If your care team  prescribes any medication for TB, you should start taking the TB medication before starting this medication. Make sure to finish the full course of TB medication. Avoid taking medications that contain aspirin, acetaminophen, ibuprofen, naproxen, or ketoprofen unless instructed by your care team. These medications may hide fever. Talk to your care team about your risk of cancer. You may be more at risk for certain types of cancer if you take this medication. This medication can decrease the response to a vaccine. If you need to get vaccinated, tell your care team if you have received this medication. Extra booster doses may be needed. Talk to your care team to see if a different vaccination schedule is needed. What side effects may I notice from receiving this medication? Side effects that you should report to your care team as soon as possible: Allergic reactions--skin rash, itching, hives, swelling of the face, lips, tongue, or throat Body pain, tingling, or numbness Eye pain, change in vision, vision loss Heart failure--shortness of breath, swelling of the ankles, feet, or hands, sudden weight gain, unusual weakness or fatigue Infection--fever, chills, cough, sore throat, wounds that don't heal, pain or trouble when passing urine, general feeling of discomfort or being unwell Liver injury--right upper belly pain, loss of appetite, nausea, light-colored stool, dark yellow or brown urine, yellowing skin or eyes, unusual weakness or fatigue Low red blood cell level--unusual weakness or fatigue, dizziness, headache, trouble breathing Lupus-like syndrome--joint pain, swelling, or stiffness, butterfly-shaped rash on the face, rashes that get worse in the sun, fever, unusual weakness or fatigue New or worsening psoriasis--rash with itchy, scaly patches Seizures Unusual bruising or bleeding Weakness in arms and legs Side effects that usually do not require medical attention (report to your care team if  they continue or are bothersome): Headache Pain, redness, or irritation at injection site Sinus pain or pressure around the face or forehead This list may not describe all possible side effects. Call your doctor for medical advice about side effects. You may report side effects to FDA at 1-800-FDA-1088. Where should I keep my medication? Keep out of the reach of children and pets. See product for storage information. Each product may have different instructions. Get rid of any unused medication after the expiration date. To get rid of medications that are no longer needed or have expired: Take the medication to a medication take-back program. Check with your pharmacy or law enforcement to find a location. If you cannot return the medication, ask your pharmacist or care team how to get rid of this medication safely. NOTE: This sheet is a summary. It may not cover all possible information. If you have questions about this medicine, talk to your doctor, pharmacist, or health care provider.  2024 Elsevier/Gold Standard (2022-04-04 00:00:00)

## 2023-08-19 NOTE — Telephone Encounter (Signed)
Pending OV note from today, please start Enbrel SURECLICK BIV  Dose: 50mg  SQ every 7 days  Patient appears to have been on Humira in past so may not require new start visit  Chesley Mires, PharmD, MPH, BCPS, CPP Clinical Pharmacist (Rheumatology and Pulmonology)

## 2023-08-30 ENCOUNTER — Other Ambulatory Visit: Payer: Self-pay | Admitting: Internal Medicine

## 2023-08-30 DIAGNOSIS — M069 Rheumatoid arthritis, unspecified: Secondary | ICD-10-CM

## 2023-09-06 ENCOUNTER — Ambulatory Visit: Payer: Self-pay | Admitting: Family Medicine

## 2023-09-06 ENCOUNTER — Other Ambulatory Visit (HOSPITAL_COMMUNITY): Payer: Self-pay

## 2023-09-06 ENCOUNTER — Other Ambulatory Visit: Payer: Self-pay | Admitting: Pharmacist

## 2023-09-06 MED ORDER — ENBREL SURECLICK 50 MG/ML ~~LOC~~ SOAJ
50.0000 mg | SUBCUTANEOUS | 0 refills | Status: DC
Start: 2023-09-06 — End: 2023-12-12
  Filled 2023-09-09: qty 4, 28d supply, fill #0
  Filled 2023-09-27 (×2): qty 4, 28d supply, fill #1
  Filled 2023-11-05: qty 4, 28d supply, fill #2

## 2023-09-06 NOTE — Telephone Encounter (Signed)
Submitted a Prior Authorization request to [facility] for ENBREL via CoverMyMeds. Will update once we receive a response.  Key: [id number]

## 2023-09-06 NOTE — Telephone Encounter (Signed)
Per automated response on CMM, Authorization already on file for this request.  Test claim shows that copay for 28 day supply is $4.60  Spoke with patient - she is fine with [facility] filing medication. She is aware that Mills [facility] will reach out to schedule shipment  Chesley Mires, PharmD, MPH, BCPS, CPP Clinical Pharmacist (Rheumatology and Pulmonology)

## 2023-09-06 NOTE — Progress Notes (Signed)
Patient is restarting Enbrel 50mg  SQ every 7 days. Per Dr. Dimple Casey, will not need new start visit since patient has taken in past and tolerated without issue.  She also taken MTX and Humira  Chesley Mires, PharmD, MPH, BCPS, CPP Clinical Pharmacist (Rheumatology and Pulmonology)

## 2023-09-09 ENCOUNTER — Other Ambulatory Visit: Payer: Self-pay

## 2023-09-09 ENCOUNTER — Other Ambulatory Visit (HOSPITAL_COMMUNITY): Payer: Self-pay

## 2023-09-09 NOTE — Progress Notes (Signed)
Specialty Pharmacy Initial Fill Coordination Note  [name] is a 72 y.o. female contacted today regarding refills of specialty medication(s) Etanercept   Patient requested Delivery   Delivery date: 09/12/23   Verified address: [address]   [place] [address]   Medication will be filled on 09/11/2023.   Patient is aware of $4.60 copayment, she would like copay to be billed to AR account at this time.  Pt will need to have Welcome Packet mailed to her as her MyChart is not active.

## 2023-09-11 ENCOUNTER — Other Ambulatory Visit: Payer: Self-pay

## 2023-09-11 ENCOUNTER — Other Ambulatory Visit (HOSPITAL_COMMUNITY): Payer: Self-pay

## 2023-09-26 ENCOUNTER — Other Ambulatory Visit: Payer: Self-pay

## 2023-09-27 ENCOUNTER — Other Ambulatory Visit: Payer: Self-pay

## 2023-09-27 NOTE — Progress Notes (Signed)
Specialty Pharmacy Refill Coordination Note  [name] is a 72 y.o. female contacted today regarding refills of specialty medication(s) Etanercept   Patient requested Delivery   Delivery date: 10/04/23   Verified address: [address]   [place] [address]   Medication will be filled on 10/03/23.

## 2023-09-30 ENCOUNTER — Other Ambulatory Visit: Payer: Self-pay | Admitting: Family Medicine

## 2023-09-30 DIAGNOSIS — G8929 Other chronic pain: Secondary | ICD-10-CM

## 2023-10-03 ENCOUNTER — Other Ambulatory Visit: Payer: Self-pay

## 2023-10-09 ENCOUNTER — Other Ambulatory Visit: Payer: Self-pay | Admitting: Neurology

## 2023-10-09 NOTE — Telephone Encounter (Signed)
Landon with [facility] pharmacy states the directions for  Topiramate have changed on the prescription and wants to verify if it is accurate. Requesting call back to [phone]

## 2023-10-22 ENCOUNTER — Other Ambulatory Visit: Payer: Self-pay

## 2023-10-22 ENCOUNTER — Other Ambulatory Visit (HOSPITAL_COMMUNITY): Payer: Self-pay

## 2023-10-28 ENCOUNTER — Other Ambulatory Visit: Payer: Self-pay | Admitting: Family Medicine

## 2023-10-28 DIAGNOSIS — I10 Essential (primary) hypertension: Secondary | ICD-10-CM

## 2023-11-05 ENCOUNTER — Other Ambulatory Visit: Payer: Self-pay

## 2023-11-05 NOTE — Progress Notes (Signed)
 Specialty Pharmacy Refill Coordination Note  [name] is a 73 y.o. female contacted today regarding refills of specialty medication(s) Etanercept  (Enbrel  SureClick)   Patient requested Delivery   Delivery date: 11/07/23   Verified address: [address]   [place] [address]   Medication will be filled on 11/06/23.

## 2023-11-06 ENCOUNTER — Other Ambulatory Visit (HOSPITAL_COMMUNITY): Payer: Self-pay

## 2023-11-06 ENCOUNTER — Other Ambulatory Visit: Payer: Self-pay

## 2023-11-08 NOTE — Progress Notes (Signed)
Office Visit Note  Patient: [name]             Date of Birth: [birth year]           MRN: [id number]             PCP: Lorayne Bender, MD Referring: Lorayne Bender, MD Visit Date: 11/19/2023   Subjective:  Follow-up   Discussed the use of AI scribe software for clinical note transcription with the patient, who gave verbal consent to proceed.  History of Present Illness   [name] is a 73 y.o. female here for follow up for seropositive erosive rheumatoid arthritis on Enbrel 50 mg [place] weekly.  She patient presents with hand pain and stiffness.  She experiences ongoing pain and stiffness in her hands, primarily across the palms and knuckles, with difficulty straightening her fingers. The symptoms have been persistent, fluctuating weekly, and today is similar to her typical day over the past month, with significant discomfort when attempting to flatten her hand. No recent upper respiratory infections or swelling in her legs or knees.  She has not taken any steroids since her last visit and is not using any over-the-counter medications for her symptoms. She is currently on Enbrel, administered as a weekly shot.  She has not previously seen an occupational therapist for her hand issues. She is concerned about the potential for her hand to become claw-like due to the persistent stiffness and pain.    Previous HPI 08/19/2023 [name] is a 73 y.o. female here for follow up for seropositive erosive rheumatoid arthritis.  We started on leflunomide 20 mg daily after last visit but she had to stop this due to pretty severe GI intolerance.  So far on no RA treatment she is having a lot of joint problems with pain and swelling in the left hand and wrist and decreased grip function on both hands.  Staying stiff for several hours daily.   Previous HPI 06/18/2023 [name] is a 73 y.o. female here for follow up for seropositive erosive rheumatoid arthritis.  Lab testing initial visit  showed mildly elevated sedimentation rate 34 however this is better than previous labs reviewed preceding months.  Changes consistent with longstanding erosive disease extensive throughout MTP joints.  Chest x-ray was normal.  She did not feel a very great relief in symptoms with the course of oral prednisone.  Still has pain and stiffness and swelling of multiple joints but especially tenderness with any firm pressure or with use.   Previous HPI 05/28/23 [name] is a 73 y.o. female here for evaluation management of seropositive erosive rheumatoid arthritis.  Original diagnosis in 2016 with joint pain and swelling in multiple areas including both elbows hands and right knee.  Initial treatments include methotrexate and prednisone.  Subsequently tried increase in methotrexate dose and switching to subcutaneous route with only partial disease response.  She was started on Humira in January 2019 with some disease response subsequently increased to weekly dosing in August 2019.  This was then disrupted and not following regularly starting due to coronavirus.  Off RA medication her worst problem is pain and swelling in the left hand.  She is also noticing some progressive worsening of changes at the MCP joints and lateral deviation in her fingers.  However she is having pretty much widespread pain throughout upper and lower extremities.  She takes gabapentin 300 mg twice daily and has been on chronic low-dose hydrocodone and back injections treatments used for chronic pain.  Labs reviewed 10/2017 HBV neg HCV neg   Imaging reviewed  10/2018 DEXA with osteopenia - FRAX 19%, 3.6%   Review of Systems  Constitutional:  Negative for fatigue.  HENT:  Negative for mouth sores and mouth dryness.   Eyes:  Negative for dryness.  Respiratory:  Negative for shortness of breath.   Cardiovascular:  Negative for chest pain and palpitations.  Gastrointestinal:  Negative for blood in stool, constipation and  diarrhea.  Endocrine: Negative for increased urination.  Genitourinary:  Negative for involuntary urination.  Musculoskeletal:  Positive for joint pain, joint pain, joint swelling, myalgias, muscle weakness, morning stiffness, muscle tenderness and myalgias. Negative for gait problem.  Skin:  Negative for color change, rash, hair loss and sensitivity to sunlight.  Allergic/Immunologic: Positive for susceptible to infections.  Neurological:  Positive for headaches. Negative for dizziness.  Hematological:  Negative for swollen glands.  Psychiatric/Behavioral:  Negative for depressed mood and sleep disturbance. The patient is not nervous/anxious.     PMFS History:  Patient Active Problem List   Diagnosis Date Noted   Malaise and fatigue 07/04/2023   High risk medication use 05/28/2023   Thyroid nodule 12/31/2022   Syncope and collapse 02/28/2021   Peripheral neuropathy 01/03/2021   Dyspnea on exertion 08/04/2020   CVA (cerebral vascular accident) (HCC) 09/09/2019   Osteopenia of neck of femur 02/05/2019   Coronary artery disease 10/16/2018   Hypersomnia with sleep apnea 04/18/2017   Post traumatic stress disorder (PTSD) 04/18/2017   COPD mixed type (HCC) 04/18/2017   Fibromyalgia 08/18/2015   Rheumatoid arthritis (HCC) 03/09/2015   Essential hypertension, benign 02/18/2015   Cervical disc disorder with radiculopathy of cervical region 01/21/2015   History of colonic polyps 03/01/2014   Dyshidrotic eczema 12/29/2013   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   Chronic migraine without aura, with intractable migraine, so stated, with status migrainosus 07/29/2007   DEPENDENCE, BARB/SED, 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   COPD (chronic obstructive pulmonary disease) (HCC)    Depression    Ear pain, bilateral 03/30/2019   Fibromyalgia    08/18/15  [facility]   GERD (gastroesophageal reflux disease)    Headache 05/27/2013   History of CVA (cerebrovascular accident)    "11 minni srokes"   Hyperlipidemia    Hypertension    Migraine    Neck pain 02/21/2012   Personal history of colonic polyp - adenoma 03/01/2014   Personality disorder (HCC)    Productive cough 11/09/2020   Rash and nonspecific skin eruption 08/04/2020   Rheumatoid arthritis (HCC)    08/18/15  [facility]   Seizures (HCC)    last seizure 4 years ago   Sleep apnea    Mild. No need for CPAP.   Stroke [facility])     Family History  Problem Relation Age of Onset   Cirrhosis Mother    Diabetes Mother    Cirrhosis Father    Breast cancer Sister 34   Seizures Sister    Breast cancer Sister 39   Migraines  Sister    Colon cancer Neg Hx    Past Surgical History:  Procedure Laterality Date   ANKLE SURGERY Left    APPENDECTOMY     BACK SURGERY     BUNIONECTOMY     2 toes   CARDIAC CATHETERIZATION  2008   Normal. Dr Elease Hashimoto.   CHOLECYSTECTOMY     CYST EXCISION     leg   ESOPHAGOGASTRODUODENOSCOPY (EGD) WITH PROPOFOL N/A 06/27/2018   Procedure: ESOPHAGOGASTRODUODENOSCOPY (EGD) WITH PROPOFOL;  Surgeon: Wyline Mood, MD;  Location: [facility] ENDOSCOPY;  Service: Gastroenterology;  Laterality: N/A;   FLANK MASS EXCISION     FOOT TENDON SURGERY  june 2014   both feet   KNEE SURGERY     Bilateral.   NOSE SURGERY     SHOULDER SURGERY     bil.   TOTAL ABDOMINAL HYSTERECTOMY     Social History   Social History Narrative   Patient lives at home with her husband Marilu Favre.    Right Handed   Drinks no caffeine   Immunization History  Administered Date(s)  Administered   Fluad Quad(high Dose 65+) 08/03/2020, 12/28/2022   Fluad Trivalent(High Dose 65+) 06/27/2023   Influenza Split 08/21/2011   Influenza Whole 07/29/2007, 07/29/2008, 11/07/2009   Influenza,inj,Quad PF,6+ Mos 07/31/2013, 10/16/2018   Influenza-Unspecified 08/18/2015   PFIZER(Purple Top)SARS-COV-2 Vaccination 12/24/2019, 01/14/2020   Pneumococcal Conjugate-13 08/18/2015   Pneumococcal Polysaccharide-23 07/29/2007, 02/15/2011, 12/15/2015     Objective: Vital Signs: BP 115/76 (BP Location: Left Arm, Patient Position: Sitting, Cuff Size: Large)   Pulse 64   Resp 14   Ht 5\' 5"  (1.651 m)   Wt 221 lb (100.2 kg)   BMI 36.78 kg/m    Physical Exam HENT:     Mouth/Throat:     Mouth: Mucous membranes are moist.     Pharynx: Oropharynx is clear.  Eyes:     Conjunctiva/sclera: Conjunctivae normal.  Cardiovascular:     Rate and Rhythm: Normal rate and regular rhythm.  Pulmonary:     Effort: Pulmonary effort is normal.     Breath sounds: Normal breath sounds.  Musculoskeletal:     Right lower leg: No edema.     Left lower leg: No edema.  Lymphadenopathy:     Cervical: No cervical adenopathy.  Skin:    General: Skin is warm and dry.     Findings: No rash.  Neurological:     Mental Status: She is alert.  Psychiatric:        Mood and Affect: Mood normal.      Musculoskeletal Exam:  Elbows full ROM no tenderness or swelling Left wrist tenderness to pressure, right wrist restricted extension with guarding, left hand incompletely reducible flexion and lateral deformity, right hand is reducible but guarding in 2nd-3rd MCPs Knees full ROM no tenderness or swelling Ankles full ROM no tenderness or swelling   Investigation: No additional findings.  Imaging: No results found.  Recent Labs: Lab Results  Component Value Date   WBC 8.1 07/04/2023   HGB 10.4 (L) 07/04/2023   PLT 222 07/04/2023   NA 140 07/04/2023   K 4.7 07/04/2023   CL 103 07/04/2023   CO2 22  07/04/2023   GLUCOSE 90 07/04/2023   BUN 10 07/04/2023   CREATININE 0.74 07/04/2023   BILITOT <0.2 07/04/2023   ALKPHOS 135 (H) 07/04/2023   AST 21 07/04/2023   ALT 18 07/04/2023   PROT 7.7 07/04/2023   ALBUMIN 4.0 07/04/2023   CALCIUM 8.6 (L) 07/04/2023  GFRAA 93 07/28/2020   QFTBGOLDPLUS NEGATIVE 05/28/2023    Speciality Comments: No specialty comments available.  Procedures:  No procedures performed Allergies: Tetanus toxoids, Alendronate, Codeine phosphate, Flexeril [cyclobenzaprine], Ibuprofen, and Penicillins   Assessment / Plan:     Visit Diagnoses: Rheumatoid arthritis involving shoulder, unspecified laterality, unspecified whether rheumatoid factor present (HCC) - Plan: Sedimentation rate, predniSONE (DELTASONE) 5 MG tablet, Ambulatory referral to Occupational Therapy Persistent hand pain and stiffness despite Enbrel therapy. Difficulty in straightening fingers, with pain on the palm and knuckles. No systemic symptoms or lower extremity involvement. -Continue Enbrel injections once weekly. -Add Prednisone 5mg  daily in the morning with food to control inflammation. -Check blood work today to assess disease activity. -Refer to Occupational Therapy for hand exercises and joint protection strategies. -Reevaluate in a few weeks to assess response to added Prednisone.   High risk medication use - resume Enbrel 50 mg subcu weekly - Plan: CBC with Differential/Platelet, COMPLETE METABOLIC PANEL WITH GFR No serious interval infections.  No longer having any injection site reaction with Enbrel. -Checking CBC and CMP for medication monitoring on continued long-term use of Enbrel  Other polyneuropathy Fibromyalgia - gabapentin 300 mg twice daily and hydrocodone 10 mg 3 times daily   Orders: Orders Placed This Encounter  Procedures   Sedimentation rate   CBC with Differential/Platelet   COMPLETE METABOLIC PANEL WITH GFR   Ambulatory referral to Occupational Therapy   Meds  ordered this encounter  Medications   predniSONE (DELTASONE) 5 MG tablet    Sig: Take 1 tablet (5 mg total) by mouth daily with breakfast.    Dispense:  90 tablet    Refill:  0     Follow-Up Instructions: Return in about 3 months (around 02/17/2024) for RA on ENB/GC f/u 3mos.   Fuller Plan, MD  Note - This record has been created using [employer].  Chart creation errors have been sought, but may not always  have been located. Such creation errors do not reflect on  the standard of medical care.

## 2023-11-12 ENCOUNTER — Other Ambulatory Visit: Payer: Self-pay | Admitting: Internal Medicine

## 2023-11-14 ENCOUNTER — Other Ambulatory Visit: Payer: Self-pay | Admitting: Family Medicine

## 2023-11-14 DIAGNOSIS — R0609 Other forms of dyspnea: Secondary | ICD-10-CM

## 2023-11-19 ENCOUNTER — Encounter: Payer: Self-pay | Admitting: Internal Medicine

## 2023-11-19 ENCOUNTER — Ambulatory Visit: Payer: 59 | Attending: Internal Medicine | Admitting: Internal Medicine

## 2023-11-19 VITALS — BP 115/76 | HR 64 | Resp 14 | Ht 65.0 in | Wt 221.0 lb

## 2023-11-19 DIAGNOSIS — Z79899 Other long term (current) drug therapy: Secondary | ICD-10-CM | POA: Diagnosis not present

## 2023-11-19 DIAGNOSIS — G6289 Other specified polyneuropathies: Secondary | ICD-10-CM

## 2023-11-19 DIAGNOSIS — M069 Rheumatoid arthritis, unspecified: Secondary | ICD-10-CM

## 2023-11-19 DIAGNOSIS — M797 Fibromyalgia: Secondary | ICD-10-CM

## 2023-11-19 MED ORDER — PREDNISONE 5 MG PO TABS
5.0000 mg | ORAL_TABLET | Freq: Every day | ORAL | 0 refills | Status: DC
Start: 2023-11-19 — End: 2024-02-17

## 2023-11-20 ENCOUNTER — Telehealth: Payer: Self-pay | Admitting: Pharmacist

## 2023-11-20 LAB — COMPLETE METABOLIC PANEL WITH GFR
AG Ratio: 1 (calc) (ref 1.0–2.5)
ALT: 15 U/L (ref 6–29)
AST: 20 U/L (ref 10–35)
Albumin: 3.7 g/dL (ref 3.6–5.1)
Alkaline phosphatase (APISO): 104 U/L (ref 37–153)
BUN: 13 mg/dL (ref 7–25)
CO2: 25 mmol/L (ref 20–32)
Calcium: 8.5 mg/dL — ABNORMAL LOW (ref 8.6–10.4)
Chloride: 109 mmol/L (ref 98–110)
Creat: 0.78 mg/dL (ref 0.60–1.00)
Globulin: 3.8 g/dL — ABNORMAL HIGH (ref 1.9–3.7)
Glucose, Bld: 73 mg/dL (ref 65–99)
Potassium: 4.2 mmol/L (ref 3.5–5.3)
Sodium: 141 mmol/L (ref 135–146)
Total Bilirubin: 0.2 mg/dL (ref 0.2–1.2)
Total Protein: 7.5 g/dL (ref 6.1–8.1)
eGFR: 80 mL/min/{1.73_m2} (ref >=60)

## 2023-11-20 LAB — CBC WITH DIFFERENTIAL/PLATELET
Absolute Lymphocytes: 2486 {cells}/uL (ref 850–3900)
Absolute Monocytes: 704 {cells}/uL (ref 200–950)
Basophils Absolute: 27 {cells}/uL (ref 0–200)
Basophils Relative: 0.4 %
Eosinophils Absolute: 241 {cells}/uL (ref 15–500)
Eosinophils Relative: 3.6 %
HCT: 33.8 % — ABNORMAL LOW (ref 35.0–45.0)
Hemoglobin: 10 g/dL — ABNORMAL LOW (ref 11.7–15.5)
MCH: 24.3 pg — ABNORMAL LOW (ref 27.0–33.0)
MCHC: 29.6 g/dL — ABNORMAL LOW (ref 32.0–36.0)
MCV: 82.2 fL (ref 80.0–100.0)
MPV: 12.4 fL (ref 7.5–12.5)
Monocytes Relative: 10.5 %
Neutro Abs: 3243 {cells}/uL (ref 1500–7800)
Neutrophils Relative %: 48.4 %
Platelets: 210 10*3/uL (ref 140–400)
RBC: 4.11 10*6/uL (ref 3.80–5.10)
RDW: 15.2 % — ABNORMAL HIGH (ref 11.0–15.0)
Total Lymphocyte: 37.1 %
WBC: 6.7 10*3/uL (ref 3.8–10.8)

## 2023-11-20 LAB — SEDIMENTATION RATE: Sed Rate: 39 mm/h — ABNORMAL HIGH (ref 0–30)

## 2023-11-20 NOTE — Telephone Encounter (Signed)
Received letter from [facility] requesting PA renewal for Enbrel  STARTED a Prior Authorization request to [facility] for ENBREL via CoverMyMeds. Pending OV note from 11/19/2023 to be signed  Key: [id number]   Chesley Mires, PharmD, MPH, BCPS, CPP Clinical Pharmacist (Rheumatology and Pulmonology)

## 2023-11-25 NOTE — Telephone Encounter (Signed)
Received notification from [facility] regarding a prior authorization for ENBREL. Authorization has been APPROVED from 11/25/2023 to 05/24/2024. Approval letter sent to scan center.  Authorization # [id number]  Chesley Mires, PharmD, MPH, BCPS, CPP Clinical Pharmacist (Rheumatology and Pulmonology)

## 2023-11-25 NOTE — Telephone Encounter (Signed)
PA for Enbrel submitted with clinicals  Key: [id number]  Chesley Mires, PharmD, MPH, BCPS, CPP Clinical Pharmacist (Rheumatology and Pulmonology)

## 2023-11-27 ENCOUNTER — Other Ambulatory Visit: Payer: Self-pay

## 2023-11-29 ENCOUNTER — Other Ambulatory Visit (HOSPITAL_COMMUNITY): Payer: Self-pay

## 2023-12-02 ENCOUNTER — Other Ambulatory Visit: Payer: Self-pay

## 2023-12-09 ENCOUNTER — Other Ambulatory Visit: Payer: Self-pay | Admitting: Neurology

## 2023-12-10 ENCOUNTER — Other Ambulatory Visit: Payer: Self-pay | Admitting: Family Medicine

## 2023-12-10 DIAGNOSIS — F418 Other specified anxiety disorders: Secondary | ICD-10-CM

## 2023-12-12 ENCOUNTER — Other Ambulatory Visit: Payer: Self-pay | Admitting: Pharmacy Technician

## 2023-12-12 ENCOUNTER — Other Ambulatory Visit: Payer: Self-pay | Admitting: Internal Medicine

## 2023-12-12 ENCOUNTER — Other Ambulatory Visit (HOSPITAL_COMMUNITY): Payer: Self-pay

## 2023-12-12 ENCOUNTER — Other Ambulatory Visit: Payer: Self-pay

## 2023-12-12 DIAGNOSIS — M069 Rheumatoid arthritis, unspecified: Secondary | ICD-10-CM

## 2023-12-12 DIAGNOSIS — Z79899 Other long term (current) drug therapy: Secondary | ICD-10-CM

## 2023-12-12 NOTE — Telephone Encounter (Signed)
Last Fill: 09/06/2023  Labs: 11/19/2023 Calcium 8.5, Globulin 3.8  TB Gold: 05/28/2023 neg   Next Visit: 02/17/2024  Last Visit: 11/19/2023  [id number] arthritis involving shoulder, unspecified laterality, unspecified whether rheumatoid factor present   Current Dose per office note 11/19/2023: Enbrel 50 mg subcu weekly   Okay to refill Enbrel?

## 2023-12-12 NOTE — Progress Notes (Signed)
Specialty Pharmacy Refill Coordination Note  [name] is a 73 y.o. female contacted today regarding refills of specialty medication(s) Etanercept (Enbrel SureClick)   Patient requested Delivery   Delivery date: 12/17/23   Verified address: [address]  [place]   Medication will be filled on 12/16/23.  RR sent to MD; ship as soon as MD approve patient aware.

## 2023-12-13 ENCOUNTER — Other Ambulatory Visit: Payer: Self-pay

## 2023-12-13 ENCOUNTER — Other Ambulatory Visit (HOSPITAL_COMMUNITY): Payer: Self-pay

## 2023-12-13 MED ORDER — ENBREL SURECLICK 50 MG/ML ~~LOC~~ SOAJ
50.0000 mg | SUBCUTANEOUS | 0 refills | Status: DC
  Filled 2023-12-13: qty 4, 28d supply, fill #0

## 2023-12-13 NOTE — Progress Notes (Signed)
Have symptoms and better controlled with resuming the low-dose prednisone 5 mg once daily?  Sed rate of 39 remained mild increased but similar as it was 6 months ago at 34.  Blood count is stable with very mild anemia hemoglobin of 10.  Kidney and liver function remain normal.

## 2023-12-16 ENCOUNTER — Other Ambulatory Visit: Payer: Self-pay

## 2023-12-18 ENCOUNTER — Encounter: Payer: Self-pay | Admitting: *Deleted

## 2023-12-23 ENCOUNTER — Other Ambulatory Visit: Payer: Self-pay | Admitting: Family Medicine

## 2023-12-23 DIAGNOSIS — G8929 Other chronic pain: Secondary | ICD-10-CM

## 2023-12-31 ENCOUNTER — Other Ambulatory Visit: Payer: Self-pay

## 2024-01-02 ENCOUNTER — Other Ambulatory Visit: Payer: Self-pay

## 2024-01-06 ENCOUNTER — Other Ambulatory Visit: Payer: Self-pay

## 2024-01-07 ENCOUNTER — Other Ambulatory Visit: Payer: Self-pay | Admitting: Family Medicine

## 2024-01-07 DIAGNOSIS — F418 Other specified anxiety disorders: Secondary | ICD-10-CM

## 2024-02-03 ENCOUNTER — Other Ambulatory Visit: Payer: Self-pay | Admitting: Family Medicine

## 2024-02-03 DIAGNOSIS — F418 Other specified anxiety disorders: Secondary | ICD-10-CM

## 2024-02-06 NOTE — Progress Notes (Unsigned)
 Office Visit Note  Patient: [name]             Date of Birth: 16-Feb-1951           MRN: [id number]             PCP: Naida Austria, MD Referring: Naida Austria, MD Visit Date: 02/17/2024   Subjective:  Follow-up (Bil leg pain)   History of Present Illness: [name] is a 73 y.o. female here for follow up for seropositive erosive rheumatoid arthritis on Enbrel  50 mg [place] weekly and prednisone  5 mg daily.   Previous HPI 11/19/2023 [name] is a 73 y.o. female here for follow up for seropositive erosive rheumatoid arthritis on Enbrel  50 mg [place] weekly.  She patient presents with hand pain and stiffness.   She experiences ongoing pain and stiffness in her hands, primarily across the palms and knuckles, with difficulty straightening her fingers. The symptoms have been persistent, fluctuating weekly, and today is similar to her typical day over the past month, with significant discomfort when attempting to flatten her hand. No recent upper respiratory infections or swelling in her legs or knees.   She has not taken any steroids since her last visit and is not using any over-the-counter medications for her symptoms. She is currently on Enbrel , administered as a weekly shot.   She has not previously seen an occupational therapist for her hand issues. She is concerned about the potential for her hand to become claw-like due to the persistent stiffness and pain.      Previous HPI 08/19/2023 [name] is a 73 y.o. female here for follow up for seropositive erosive rheumatoid arthritis.  We started on leflunomide  20 mg daily after last visit but she had to stop this due to pretty severe GI intolerance.  So far on no RA treatment she is having a lot of joint problems with pain and swelling in the left hand and wrist and decreased grip function on both hands.  Staying stiff for several hours daily.   Previous HPI 06/18/2023 [name] is a 73 y.o. female here for follow up  for seropositive erosive rheumatoid arthritis.  Lab testing initial visit showed mildly elevated sedimentation rate 34 however this is better than previous labs reviewed preceding months.  Changes consistent with longstanding erosive disease extensive throughout MTP joints.  Chest x-ray was normal.  She did not feel a very great relief in symptoms with the course of oral prednisone .  Still has pain and stiffness and swelling of multiple joints but especially tenderness with any firm pressure or with use.   Previous HPI 05/28/23 [name] is a 73 y.o. female here for evaluation management of seropositive erosive rheumatoid arthritis.  Original diagnosis in 2016 with joint pain and swelling in multiple areas including both elbows hands and right knee.  Initial treatments include methotrexate and prednisone .  Subsequently tried increase in methotrexate dose and switching to subcutaneous route with only partial disease response.  She was started on Humira in January 2019 with some disease response subsequently increased to weekly dosing in August 2019.  This was then disrupted and not following regularly starting due to coronavirus.  Off RA medication her worst problem is pain and swelling in the left hand.  She is also noticing some progressive worsening of changes at the MCP joints and lateral deviation in her fingers.  However she is having pretty much widespread pain throughout upper and lower extremities.  She takes gabapentin  300 mg  twice daily and has been on chronic low-dose hydrocodone  and back injections treatments used for chronic pain.     Labs reviewed 10/2017 HBV neg HCV neg   Imaging reviewed  10/2018 DEXA with osteopenia - FRAX 19%, 3.6%   Review of Systems  Constitutional:  Negative for fatigue.  HENT:  Negative for mouth sores and mouth dryness.   Eyes:  Negative for dryness.  Respiratory:  Negative for shortness of breath.   Cardiovascular:  Negative for chest pain and  palpitations.  Gastrointestinal:  Negative for blood in stool, constipation and diarrhea.  Endocrine: Negative for increased urination.  Genitourinary:  Negative for involuntary urination.  Musculoskeletal:  Positive for joint pain, joint pain, joint swelling, myalgias, muscle weakness, morning stiffness, muscle tenderness and myalgias. Negative for gait problem.  Skin:  Negative for color change, rash, hair loss and sensitivity to sunlight.  Allergic/Immunologic: Negative for susceptible to infections.  Neurological:  Positive for headaches. Negative for dizziness.  Hematological:  Negative for swollen glands.  Psychiatric/Behavioral:  Negative for depressed mood and sleep disturbance. The patient is not nervous/anxious.     PMFS History:  Patient Active Problem List   Diagnosis Date Noted   Malaise and fatigue 07/04/2023   High risk medication use 05/28/2023   Thyroid  nodule 12/31/2022   Syncope and collapse 02/28/2021   Peripheral neuropathy 01/03/2021   Dyspnea on exertion 08/04/2020   CVA (cerebral vascular accident) (HCC) 09/09/2019   Osteopenia of neck of femur 02/05/2019   Coronary artery disease 10/16/2018   Hypersomnia with sleep apnea 04/18/2017   Post traumatic stress disorder (PTSD) 04/18/2017   COPD mixed type (HCC) 04/18/2017   Fibromyalgia 08/18/2015   Rheumatoid arthritis (HCC) 03/09/2015   Essential hypertension, benign 02/18/2015   Cervical disc disorder with radiculopathy of cervical region 01/21/2015   History of colonic polyps 03/01/2014   Dyshidrotic eczema 12/29/2013   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   Chronic migraine without aura, with intractable migraine, so stated, with status migrainosus 07/29/2007   DEPENDENCE, BARB/SED, CONTINUOUS 06/24/2007   HYPERLIPIDEMIA 12/19/2006   OBESITY, NOS 12/19/2006   Depression with anxiety 12/19/2006   PANIC ATTACKS 12/19/2006   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   COPD (chronic obstructive pulmonary disease) (HCC)    Depression    Ear pain, bilateral 03/30/2019   Fibromyalgia    08/18/15  [facility]   GERD (gastroesophageal reflux disease)    Headache 05/27/2013   History of CVA (cerebrovascular accident)    "11 minni srokes"   Hyperlipidemia    Hypertension    Migraine    Neck pain 02/21/2012   Personal history of colonic polyp - adenoma 03/01/2014   Personality disorder (HCC)    Productive cough 11/09/2020   Rash and nonspecific skin eruption 08/04/2020   Rheumatoid arthritis (HCC)    08/18/15  [facility]   Seizures (HCC)    last seizure 4 years ago   Sleep apnea    Mild. No need for CPAP.   Stroke [facility])     Family History  Problem Relation Age of Onset   Cirrhosis Mother    Diabetes Mother    Cirrhosis Father  Breast cancer Sister 43   Seizures Sister    Breast cancer Sister 85   Migraines Sister    Colon cancer Neg Hx    Past Surgical History:  Procedure Laterality Date   ANKLE SURGERY Left    APPENDECTOMY     BACK SURGERY     BUNIONECTOMY     2 toes   CARDIAC CATHETERIZATION  2008   Normal. Dr Alroy Aspen.   CHOLECYSTECTOMY     CYST EXCISION     leg   ESOPHAGOGASTRODUODENOSCOPY (EGD) WITH PROPOFOL  N/A 06/27/2018   Procedure: ESOPHAGOGASTRODUODENOSCOPY (EGD) WITH PROPOFOL ;  Surgeon: Luke Salaam, MD;  Location: [facility] ENDOSCOPY;  Service: Gastroenterology;  Laterality: N/A;   FLANK MASS EXCISION     FOOT TENDON SURGERY  june 2014   both feet   KNEE SURGERY     Bilateral.   NOSE SURGERY     SHOULDER SURGERY     bil.   TOTAL ABDOMINAL HYSTERECTOMY     Social History   Social History Narrative   Patient lives at home with her husband Dominick Fries.    Right Handed    Drinks no caffeine   Immunization History  Administered Date(s) Administered   Fluad Quad(high Dose 65+) 08/03/2020, 12/28/2022   Fluad Trivalent(High Dose 65+) 06/27/2023   Influenza Split 08/21/2011   Influenza Whole 07/29/2007, 07/29/2008, 11/07/2009   Influenza,inj,Quad PF,6+ Mos 07/31/2013, 10/16/2018   Influenza-Unspecified 08/18/2015   PFIZER(Purple Top)SARS-COV-2 Vaccination 12/24/2019, 01/14/2020   Pneumococcal Conjugate-13 08/18/2015   Pneumococcal Polysaccharide-23 07/29/2007, 02/15/2011, 12/15/2015     Objective: Vital Signs: BP 139/84 (BP Location: Right Arm, Patient Position: Sitting, Cuff Size: Normal)   Pulse 64   Resp 18   Ht 5\' 5"  (1.651 m)   Wt 220 lb (99.8 kg)   BMI 36.61 kg/m    Physical Exam   Musculoskeletal Exam: ***  CDAI Exam: CDAI Score: -- Patient Global: --; Provider Global: -- Swollen: --; Tender: -- Joint Exam 02/17/2024   No joint exam has been documented for this visit   There is currently no information documented on the homunculus. Go to the Rheumatology activity and complete the homunculus joint exam.  Investigation: No additional findings.  Imaging: No results found.  Recent Labs: Lab Results  Component Value Date   WBC 6.7 11/19/2023   HGB 10.0 (L) 11/19/2023   PLT 210 11/19/2023   NA 141 11/19/2023   K 4.2 11/19/2023   CL 109 11/19/2023   CO2 25 11/19/2023   GLUCOSE 73 11/19/2023   BUN 13 11/19/2023   CREATININE 0.78 11/19/2023   BILITOT 0.2 11/19/2023   ALKPHOS 135 (H) 07/04/2023   AST 20 11/19/2023   ALT 15 11/19/2023   PROT 7.5 11/19/2023   ALBUMIN 4.0 07/04/2023   CALCIUM  8.5 (L) 11/19/2023   GFRAA 93 07/28/2020   QFTBGOLDPLUS NEGATIVE 05/28/2023    Speciality Comments: No specialty comments available.  Procedures:  No procedures performed Allergies: Tetanus toxoids, Alendronate, Codeine phosphate, Flexeril  [cyclobenzaprine ], Ibuprofen , and Penicillins   Assessment / Plan:     Visit Diagnoses:  Rheumatoid arthritis involving shoulder, unspecified laterality, unspecified whether rheumatoid factor present (HCC) - Refer to Occupational Therapy for hand exercises and joint protection strategies. Referral placed 11/19/2023. - Plan: etanercept  (ENBREL  SURECLICK) 50 MG/ML injection, predniSONE  (DELTASONE ) 5 MG tablet  High risk medication use - Enbrel  50 mg subcu weekly - Plan: etanercept  (ENBREL  SURECLICK) 50 MG/ML injection  Long term (current) use of systemic steroids - Add Prednisone  5mg  daily in the morning  with food to control inflammation  Other polyneuropathy  Fibromyalgia - gabapentin  300 mg twice daily and hydrocodone  10 mg 3 times daily  Rheumatoid arthritis involving shoulder, unspecified laterality, unspecified whether rheumatoid factor present (HCC) - Plan: etanercept  (ENBREL  SURECLICK) 50 MG/ML injection, predniSONE  (DELTASONE ) 5 MG tablet  High risk medication use - Plan: etanercept  (ENBREL  SURECLICK) 50 MG/ML injection  ***  Orders: No orders of the defined types were placed in this encounter.  Meds ordered this encounter  Medications   etanercept  (ENBREL  SURECLICK) 50 MG/ML injection    Sig: Inject 50 mg into the skin once a week.    Dispense:  4 mL    Refill:  2   predniSONE  (DELTASONE ) 5 MG tablet    Sig: Take 1 tablet (5 mg total) by mouth daily with breakfast.    Dispense:  90 tablet    Refill:  0     Follow-Up Instructions: Return in about 3 months (around 05/18/2024) for RA/OA ENB/GC f/u 3mos.   Matt Song, MD  Note - This record has been created using [employer].  Chart creation errors have been sought, but may not always  have been located. Such creation errors do not reflect on  the standard of medical care.

## 2024-02-15 ENCOUNTER — Other Ambulatory Visit: Payer: Self-pay | Admitting: Internal Medicine

## 2024-02-15 DIAGNOSIS — M069 Rheumatoid arthritis, unspecified: Secondary | ICD-10-CM

## 2024-02-17 ENCOUNTER — Ambulatory Visit: Payer: 59 | Attending: Internal Medicine | Admitting: Internal Medicine

## 2024-02-17 ENCOUNTER — Encounter: Payer: Self-pay | Admitting: Internal Medicine

## 2024-02-17 ENCOUNTER — Other Ambulatory Visit: Payer: Self-pay | Admitting: Pharmacy Technician

## 2024-02-17 ENCOUNTER — Other Ambulatory Visit: Payer: Self-pay

## 2024-02-17 VITALS — BP 139/84 | HR 64 | Resp 18 | Ht 65.0 in | Wt 220.0 lb

## 2024-02-17 DIAGNOSIS — Z7952 Long term (current) use of systemic steroids: Secondary | ICD-10-CM

## 2024-02-17 DIAGNOSIS — G6289 Other specified polyneuropathies: Secondary | ICD-10-CM

## 2024-02-17 DIAGNOSIS — M797 Fibromyalgia: Secondary | ICD-10-CM | POA: Diagnosis not present

## 2024-02-17 DIAGNOSIS — M069 Rheumatoid arthritis, unspecified: Secondary | ICD-10-CM

## 2024-02-17 DIAGNOSIS — Z79899 Other long term (current) drug therapy: Secondary | ICD-10-CM

## 2024-02-17 MED ORDER — PREDNISONE 5 MG PO TABS: 5.0000 mg | ORAL_TABLET | Freq: Every day | ORAL | 0 refills | Status: DC

## 2024-02-17 MED ORDER — ENBREL SURECLICK 50 MG/ML ~~LOC~~ SOAJ
50.0000 mg | SUBCUTANEOUS | 2 refills | Status: DC
  Filled 2024-02-17: qty 4, 28d supply, fill #0
  Filled 2024-03-09: qty 4, 28d supply, fill #1
  Filled 2024-04-03: qty 4, 28d supply, fill #2

## 2024-02-17 NOTE — Patient Instructions (Signed)
 Straight leg raises This exercise strengthens the muscles in front of your thigh (quadriceps) and the muscles that move your hips (hip flexors). Lie on your back with your left / right leg extended and your other knee bent. Tense the muscles in the front of your left / right thigh. You should see your kneecap slide up or see increased dimpling just above the knee. Your thigh may even shake a bit. Keep these muscles tight as you raise your leg 4-6 inches (10-15 cm) off the floor. Do not let your knee bend. Hold this position for __________ seconds. Keep these muscles tense as you lower your leg. Relax your muscles slowly and completely after each repetition. Repeat __________ times. Complete this exercise __________ times a day. Straight leg raises, side-lying This exercise strengthens the muscles that rotate the leg at the hip and move it away from your body (hip abductors). Lie on your side with your left / right leg in the top position. Lie so your head, shoulder, knee, and hip line up. You may bend your bottom knee to help you keep your balance. Roll your hips slightly forward so your hips are stacked directly over each other and your left / right knee is facing forward. Leading with your heel, lift your top leg 4-6 inches (10-15 cm). You should feel the muscles in your outer hip lifting. Do not let your foot drift forward. Do not let your knee roll toward the ceiling. Hold this position for __________ seconds. Slowly return your leg to the starting position. Let your muscles relax completely after each repetition. Repeat __________ times. Complete this exercise __________ times a day. Squats This exercise strengthens the muscles in front of your thigh and knee (quadriceps). Stand in front of a table, with your feet and knees pointing straight ahead. You may rest your hands on the table for balance but not for support. Slowly bend your knees and lower your hips like you are going to sit in  a chair. Keep your weight over your heels, not over your toes. Keep your lower legs upright so they are parallel with the table legs. Do not let your hips go lower than your knees. Do not bend lower than told by your health care provider. If your knee pain increases, do not bend as low. Hold the squat position for __________ seconds. Slowly push with your legs to return to standing. Do not use your hands to pull yourself to standing. Repeat __________ times. Complete this exercise __________ times a day. Wall slides This exercise strengthens the muscles in front of your thigh and knee (quadriceps). Lean your back against a smooth wall or door, and walk your feet out 18-24 inches (46-61 cm) from it. Place your feet hip-width apart. Slowly slide down the wall or door until your knees bend __________ degrees. Keep your knees over your heels, not over your toes. Keep your knees in line with your hips. Hold this position for __________ seconds. Repeat __________ times. Complete this exercise __________ times a day.

## 2024-02-17 NOTE — Telephone Encounter (Signed)
 Last Fill: 11/19/2023  Next Visit: 02/17/2024  Last Visit: 11/19/2023  Dx:  Rheumatoid arthritis involving shoulder, unspecified laterality, unspecified whether rheumatoid factor present    Current Dose per office note on 11/19/2023: Prednisone  5mg  daily in the morning with food to control inflammation.   Okay to refill Prednisone ?

## 2024-02-17 NOTE — Progress Notes (Signed)
 Specialty Pharmacy Refill Coordination Note  [name] is a 73 y.o. female contacted today regarding refills of specialty medication(s) Etanercept  (Enbrel  SureClick)   Patient requested Delivery   Delivery date: 02/19/24   Verified address: [address] [place]   Medication will be filled on 02/18/24.

## 2024-02-18 ENCOUNTER — Other Ambulatory Visit: Payer: Self-pay

## 2024-02-27 ENCOUNTER — Ambulatory Visit (INDEPENDENT_AMBULATORY_CARE_PROVIDER_SITE_OTHER): Payer: Medicare Other | Admitting: Neurology

## 2024-02-27 ENCOUNTER — Encounter: Payer: Self-pay | Admitting: Neurology

## 2024-02-27 VITALS — BP 128/74 | Ht 65.0 in | Wt 223.0 lb

## 2024-02-27 DIAGNOSIS — G43711 Chronic migraine without aura, intractable, with status migrainosus: Secondary | ICD-10-CM | POA: Diagnosis not present

## 2024-02-27 DIAGNOSIS — G40309 Generalized idiopathic epilepsy and epileptic syndromes, not intractable, without status epilepticus: Secondary | ICD-10-CM | POA: Diagnosis not present

## 2024-02-27 NOTE — Patient Instructions (Signed)
 Great to see you today.  If your migraines increase please let me know, we could consider another migraine preventative.  Continue phenobarbital  for seizure prevention.  Please call for seizures.  Follow-up in 1 year.  Thanks!!

## 2024-02-27 NOTE — Progress Notes (Signed)
 Patient: [name] Date of Birth: 1951-06-23  Reason for Visit: Follow up History from: Patient, sister [name]  Primary Neurologist: [name]   ASSESSMENT AND PLAN 73 y.o. year old female   1.  History of seizures, well controlled -Last seizure was in 2013, continue phenobarbital  -In September 2022 phenobarbital  level was 27 -Check phenobarbital  level today  2.  Chronic migraine headache -Aimovig  was not helpful, remains on Topamax  100 mg at bedtime -Would consider Vyepti, wants to hold off for now, Ubrelvy . No triptans due to age, vascular risk factors  -About 2 migraines a month -In the past sleep study was negative  3.  Rheumatoid arthritis -Encouraged to follow-up with rheumatology  Follow-up in 1 year or sooner if needed  HISTORY OF PRESENT ILLNESS: Update Feb 28, 2024 SS: Here with her sister, never started Vyepti. Reports 2-3 migraine a month, frontal, temples. Will lay down, nap, doesn't take anything. Doesn't want any medication. No seizures, remains on phenobarbital . Living with her husband. She doesn't drive. Main issue is RA. Remains on Topamax  100 mg at bedtime for migraines.   05/28/2023: She has a very bad headaches, every day, light bothersher, pulsating and pounding and throbbing, nausea, hurt to move, sleep helps, no aura, no medication overuse, lasts all day long ongoing for 2 years intractable daily migraines up to 24 hours a day; daily headaches at least 20+ migraine days a month. Also gets Tried aimovig  once and gave her a bad headaches.sharp pain right temporal area every day. Constant daily shooting pain. It is in 1 spot, temporoparietal area. She is here with her family member. She has tried a plethora of medications. AImovig  worsened her headaches and she does not want anymore monthly injections. We discussed options and decided on vyepti.   Patient complains of symptoms per HPI as well as the following symptoms: migraines . Pertinent negatives and  positives per HPI. All others negative   Today 01/2022:  [name] here today for follow-up.  History of seizures well-controlled on phenobarbital . EMG left upper extremity October 2022 was normal.  Has daily headaches, got 1 dose of Aimovig , didn't continue it for some reason. Claims wakes with morning headache, sleep study with Dr. Albertina Landry in 2018 was normal. Sharp pain to right temporal area, is brief, few seconds, then gone, occasionally at night before bed. Her legs hurt from RA. Her rhematologist is in [facility], she doesn't drive, due for appointment. Her brother takes her to appointment. No seizures. Last seizure was 2013.   HISTORY 06/22/2021 Dr. Tilda Landry: [name] is a 73 year old right-handed black female with a history of rheumatoid arthritis, cerebrovascular disease, headache, and a history of seizures.  She has been well controlled with her seizures on phenobarbital .  She has had ongoing daily headaches with sharp shooting pains in the temporal regions.  She was placed on Aimovig  but never started this.  Ubrelvy  was not approved through the insurance.  She claims that she has stopped the [facility] powders.  Within the last 3 weeks, she had spontaneous onset of right heel and ankle pain, worse with weightbearing.  X-rays have been unrevealing.  She reports that there is some slight swelling of the right foot and ankle, and the ankle feels warm.  She reports that she feels weak all over.  She also reports some tingling sensations in the morning involving the left hand.  She comes back here for further evaluation.  She does report chronic low back pain and some pain  down the left leg, not the right leg.  REVIEW OF SYSTEMS: Out of a complete 14 system review of symptoms, the patient complains only of the following symptoms, and all other reviewed systems are negative.  See HPI  ALLERGIES: Allergies  Allergen Reactions   Tetanus Toxoids Swelling    Required office visit for management    Alendronate Diarrhea and Nausea And Vomiting   Codeine Phosphate Nausea And Vomiting   Flexeril  [Cyclobenzaprine ] Nausea And Vomiting and Other (See Comments)    Dizziness    Ibuprofen  Hives and Nausea And Vomiting   Penicillins Hives and Rash    Reported with amoxicillin    HOME MEDICATIONS: Outpatient Medications Prior to Visit  Medication Sig Dispense Refill   albuterol  (VENTOLIN  HFA) 108 (90 Base) MCG/ACT inhaler INHALE TWO PUFFS INTO THE LUNGS EVERY SIX (SIX) HOURS AS NEEDED FOR WHEEZING OR SHORTNESS OF BREATH. 60 each 3   aspirin  EC 81 MG EC tablet Take 1 tablet (81 mg total) by mouth daily. 30 tablet 0   atorvastatin  (LIPITOR ) 80 MG tablet Take 1 tablet (80 mg total) by mouth daily. 90 tablet 4   budesonide -formoterol  (SYMBICORT ) 80-4.5 MCG/ACT inhaler INHALE TWO PUFFS INTO THE LUNGS TWO (TWO) TIMES DAILY. AND AS NEEDED FOR SHORTNESS OF BREATH 10.2 g 3   carvedilol  (COREG ) 3.125 MG tablet TAKE 1 TABLET BY MOUTH TWICE A DAY WITH A MEAL 60 tablet 3   citalopram  (CELEXA ) 40 MG tablet TAKE ONE TABLET (40 MG TOTAL) BY MOUTH DAILY. 30 tablet 5   clotrimazole  (LOTRIMIN ) 1 % cream Apply 1 Application topically 2 (two) times daily. Under breasts 90 each 1   cyclobenzaprine  (FEXMID ) 7.5 MG tablet Take 7.5 mg by mouth 3 (three) times daily.     diclofenac  (CATAFLAM) 50 MG tablet Take by mouth.     etanercept  (ENBREL  SURECLICK) 50 MG/ML injection Inject 50 mg into the skin once a week. 4 mL 2   folic acid  (FOLVITE ) 1 MG tablet TAKE ONE TABLET (1 MG TOTAL) BY MOUTH DAILY. 90 tablet 5   gabapentin  (NEURONTIN ) 300 MG capsule Take 1 capsule (300 mg total) by mouth 2 (two) times daily. 180 capsule 0   HYDROcodone -acetaminophen  (NORCO) 10-325 MG tablet Take 2 tablets by mouth at bedtime. 1 in the morning and 1-2 at night     Multiple Vitamins-Minerals (MULTIVITAMIN WITH MINERALS) tablet Take 1 tablet by mouth daily. (Patient not taking: Reported on 07/25/2023)     naloxone (NARCAN) nasal spray 4  mg/0.1 mL      omeprazole  (PRILOSEC) 20 MG capsule Take 1 capsule (20 mg total) by mouth daily. 90 capsule 3   PHENObarbital  (LUMINAL) 100 MG tablet TAKE ONE TABLET (100 MG TOTAL) BY MOUTH DAILY. 180 tablet 4   predniSONE  (DELTASONE ) 5 MG tablet Take 1 tablet (5 mg total) by mouth daily with breakfast. 90 tablet 0   topiramate  (TOPAMAX ) 50 MG tablet Take 2 tablets (100 mg total) by mouth daily. Keep appointment for further refills 360 tablet 0   triamcinolone  cream (KENALOG ) 0.5 % Apply topically.     triamcinolone  ointment (KENALOG ) 0.1 % Apply 1 Application topically 2 (two) times daily. Apply to front of your lower leg (Patient not taking: Reported on 02/17/2024) 135 g 1   No facility-administered medications prior to visit.    PAST MEDICAL HISTORY: Past Medical History:  Diagnosis Date   After cataract 07/22/2014   Asthma    Bilateral arm pain 01/13/2015   Chronic low back pain  Collagen vascular disease (HCC)    Conversion disorder with seizures or convulsions    On Phenobarbital .   COPD (chronic obstructive pulmonary disease) (HCC)    Depression    Ear pain, bilateral 03/30/2019   Fibromyalgia    08/18/15  [facility]   GERD (gastroesophageal reflux disease)    Headache 05/27/2013   History of CVA (cerebrovascular accident)    "11 minni srokes"   Hyperlipidemia    Hypertension    Migraine    Neck pain 02/21/2012   Personal history of colonic polyp - adenoma 03/01/2014   Personality disorder (HCC)    Productive cough 11/09/2020   Rash and nonspecific skin eruption 08/04/2020   Rheumatoid arthritis (HCC)    08/18/15  [facility]   Seizures (HCC)    last seizure 4 years ago   Sleep apnea    Mild. No need for CPAP.   Stroke [facility])     PAST SURGICAL HISTORY: Past Surgical History:  Procedure Laterality Date   ANKLE SURGERY Left    APPENDECTOMY     BACK SURGERY     BUNIONECTOMY     2 toes   CARDIAC CATHETERIZATION  2008   Normal. Dr Alroy Aspen.   CHOLECYSTECTOMY     CYST EXCISION      leg   ESOPHAGOGASTRODUODENOSCOPY (EGD) WITH PROPOFOL  N/A 06/27/2018   Procedure: ESOPHAGOGASTRODUODENOSCOPY (EGD) WITH PROPOFOL ;  Surgeon: Luke Salaam, MD;  Location: [facility] ENDOSCOPY;  Service: Gastroenterology;  Laterality: N/A;   FLANK MASS EXCISION     FOOT TENDON SURGERY  june 2014   both feet   KNEE SURGERY     Bilateral.   NOSE SURGERY     SHOULDER SURGERY     bil.   TOTAL ABDOMINAL HYSTERECTOMY      FAMILY HISTORY: Family History  Problem Relation Age of Onset   Cirrhosis Mother    Diabetes Mother    Cirrhosis Father    Breast cancer Sister 75   Seizures Sister    Breast cancer Sister 22   Migraines Sister    Colon cancer Neg Hx     SOCIAL HISTORY: Social History   Socioeconomic History   Marital status: Married    Spouse name: Clearence    Number of children: 1   Years of education: 11   Highest education level: Not on file  Occupational History   Occupation: Unemployed   Tobacco Use   Smoking status: Former    Current packs/day: 0.00    Average packs/day: 1 pack/day for 7.0 years (7.0 ttl pk-yrs)    Types: Cigarettes    Start date: 41    Quit date: 1975    Years since quitting: 50.3    Passive exposure: Past   Smokeless tobacco: Never   Tobacco comments:    Started smoking age 15 quit in her mid 67s - quit alcohol at the same time.  Vaping Use   Vaping status: Never Used  Substance and Sexual Activity   Alcohol use: No   Drug use: No   Sexual activity: Not Currently  Other Topics Concern   Not on file  Social History Narrative   Patient lives at home with her husband Dominick Fries.    Right Handed   Drinks no caffeine   Social Drivers of Corporate investment banker Strain: Low Risk  (07/09/2023)   Overall Financial Resource Strain (CARDIA)    Difficulty of Paying Living Expenses: Not hard at all  Food Insecurity: No Food Insecurity (07/09/2023)  Hunger Vital Sign    Worried About Running Out of Food in the Last Year: Never true    Ran Out of  Food in the Last Year: Never true  Transportation Needs: No Transportation Needs (07/09/2023)   PRAPARE - Administrator, Civil Service (Medical): No    Lack of Transportation (Non-Medical): No  Physical Activity: Inactive (07/09/2023)   Exercise Vital Sign    Days of Exercise per Week: 0 days    Minutes of Exercise per Session: 0 min  Stress: No Stress Concern Present (07/09/2023)   [employer] of Occupational Health - Occupational Stress Questionnaire    Feeling of Stress : Not at all  Social Connections: Not on file  Intimate Partner Violence: Not At Risk (07/09/2023)   Humiliation, Afraid, Rape, and Kick questionnaire    Fear of Current or Ex-Partner: No    Emotionally Abused: No    Physically Abused: No    Sexually Abused: No    PHYSICAL EXAM  Vitals:   02/27/24 1258  BP: 128/74  Weight: 223 lb (101.2 kg)  Height: 5\' 5"  (1.651 m)    Body mass index is 37.11 kg/m.  Exam: NAD, pleasant                  Speech:    Speech is normal; fluent and spontaneous with normal comprehension.  Cognition:    The patient is oriented to person, place, and time;     recent and remote memory intact;     language fluent;    Cranial Nerves:    The pupils are equal, round, and reactive to light.Trigeminal sensation is intact and the muscles of mastication are normal. The face is symmetric. The palate elevates in the midline. Hearing intact. Voice is normal. Shoulder shrug is normal.   Coordination:  No dysmetria  Motor Observation:    No asymmetry, no atrophy, and no involuntary movements noted. Little weaker on the left arm due to pain  Tone:    Normal muscle tone.     Strength:    Strength is V/V in the upper and lower limbs.      Sensation: intact to LT  Gait: Has to push off to stand, wide-based, cautious, uses single-point cane  DIAGNOSTIC DATA (LABS, IMAGING, TESTING) - I reviewed patient records, labs, notes, testing and imaging myself where  available.  Lab Results  Component Value Date   WBC 6.7 11/19/2023   HGB 10.0 (L) 11/19/2023   HCT 33.8 (L) 11/19/2023   MCV 82.2 11/19/2023   PLT 210 11/19/2023      Component Value Date/Time   NA 141 11/19/2023 1430   NA 140 07/04/2023 1714   NA 140 09/01/2014 1600   K 4.2 11/19/2023 1430   K 4.3 09/01/2014 1600   CL 109 11/19/2023 1430   CL 111 (H) 09/01/2014 1600   CO2 25 11/19/2023 1430   CO2 18 (L) 09/01/2014 1600   GLUCOSE 73 11/19/2023 1430   GLUCOSE 78 09/01/2014 1600   BUN 13 11/19/2023 1430   BUN 10 07/04/2023 1714   BUN 10 09/01/2014 1600   CREATININE 0.78 11/19/2023 1430   CALCIUM  8.5 (L) 11/19/2023 1430   CALCIUM  8.3 (L) 09/01/2014 1600   PROT 7.5 11/19/2023 1430   PROT 7.7 07/04/2023 1714   PROT 6.8 08/16/2014 0431   ALBUMIN 4.0 07/04/2023 1714   ALBUMIN 2.7 (L) 08/16/2014 0431   AST 20 11/19/2023 1430   AST 16 08/16/2014 0431  ALT 15 11/19/2023 1430   ALT 17 08/16/2014 0431   ALKPHOS 135 (H) 07/04/2023 1714   ALKPHOS 86 08/16/2014 0431   BILITOT 0.2 11/19/2023 1430   BILITOT <0.2 07/04/2023 1714   BILITOT 0.3 08/16/2014 0431   GFRNONAA >60 12/27/2020 1703   GFRNONAA >60 09/01/2014 1600   GFRNONAA >60 07/13/2013 1013   GFRNONAA 84 08/26/2012 1520   GFRAA 93 07/28/2020 1559   GFRAA >60 09/01/2014 1600   GFRAA >60 07/13/2013 1013   GFRAA >89 08/26/2012 1520   Lab Results  Component Value Date   CHOL 206 (H) 09/09/2019   HDL 49 09/09/2019   LDLCALC 139 (H) 09/09/2019   TRIG 89 09/09/2019   CHOLHDL 4.2 09/09/2019   Lab Results  Component Value Date   HGBA1C 5.2 01/02/2021   Lab Results  Component Value Date   VITAMINB12 483 01/02/2021   Lab Results  Component Value Date   TSH 1.500 07/04/2023   Cortland Ding, DNP  [facility] [address] [place], [place] [address] [phone]

## 2024-02-28 LAB — PHENOBARBITAL LEVEL: Phenobarbital, Serum: 26 ug/mL (ref 15–40)

## 2024-03-09 ENCOUNTER — Other Ambulatory Visit: Payer: Self-pay

## 2024-03-09 NOTE — Progress Notes (Signed)
 Specialty Pharmacy Ongoing Clinical Assessment Note  [name] is a 73 y.o. female who is being followed by the specialty pharmacy service for RxSp Rheumatoid Arthritis   Patient's specialty medication(s) reviewed today: Etanercept  (Enbrel  SureClick)   Missed doses in the last 4 weeks: 0   Patient/Caregiver did not have any additional questions or concerns.   Therapeutic benefit summary: Patient is achieving benefit   Adverse events/side effects summary: No adverse events/side effects   Patient's therapy is appropriate to: Continue    Goals Addressed             This Visit's Progress    Maintain optimal adherence to therapy   On track    Patient is on track. Patient will maintain adherence and adhere to provider and/or lab appointments.      Minimize recurrence of flares   On track    Patient is on track. Patient will avoid flare triggers and be monitored by provider to determine if a change in treatment plan is warranted. Patient states pain and stiffness is better, but would like for it to be under better control.         Follow up: 6 months  [facility]

## 2024-03-09 NOTE — Progress Notes (Signed)
 Specialty Pharmacy Refill Coordination Note  [name] is a 73 y.o. female contacted today regarding refills of specialty medication(s) Etanercept  (Enbrel  SureClick)   Patient requested Delivery   Delivery date: 03/13/24   Verified address: [address] [place]   Medication will be filled on 03/12/24.

## 2024-03-12 ENCOUNTER — Other Ambulatory Visit: Payer: Self-pay

## 2024-03-19 ENCOUNTER — Other Ambulatory Visit: Payer: Self-pay | Admitting: Family Medicine

## 2024-03-19 DIAGNOSIS — G8929 Other chronic pain: Secondary | ICD-10-CM

## 2024-03-31 ENCOUNTER — Other Ambulatory Visit: Payer: Self-pay | Admitting: Family Medicine

## 2024-03-31 DIAGNOSIS — Z1231 Encounter for screening mammogram for malignant neoplasm of breast: Secondary | ICD-10-CM

## 2024-04-03 ENCOUNTER — Other Ambulatory Visit: Payer: Self-pay

## 2024-04-03 NOTE — Progress Notes (Signed)
 Specialty Pharmacy Refill Coordination Note  [name] is a 73 y.o. female contacted today regarding refills of specialty medication(s) Etanercept  (Enbrel  SureClick)   Patient requested Delivery   Delivery date: 04/10/24   Verified address: [address] [place]   Medication will be filled on 06.19.25.

## 2024-04-09 ENCOUNTER — Other Ambulatory Visit: Payer: Self-pay

## 2024-04-15 ENCOUNTER — Ambulatory Visit
Admission: RE | Admit: 2024-04-15 | Discharge: 2024-04-15 | Disposition: A | Discharge status: Home / Self Care | Source: Ambulatory Visit | Attending: Family Medicine | Admitting: Family Medicine

## 2024-04-15 DIAGNOSIS — Z1231 Encounter for screening mammogram for malignant neoplasm of breast: Secondary | ICD-10-CM

## 2024-04-21 ENCOUNTER — Ambulatory Visit: Payer: Self-pay | Admitting: Family Medicine

## 2024-05-05 ENCOUNTER — Other Ambulatory Visit: Payer: Self-pay

## 2024-05-05 ENCOUNTER — Other Ambulatory Visit: Payer: Self-pay | Admitting: Internal Medicine

## 2024-05-05 ENCOUNTER — Other Ambulatory Visit: Payer: Self-pay | Admitting: Pharmacy Technician

## 2024-05-05 DIAGNOSIS — M069 Rheumatoid arthritis, unspecified: Secondary | ICD-10-CM

## 2024-05-05 DIAGNOSIS — Z79899 Other long term (current) drug therapy: Secondary | ICD-10-CM

## 2024-05-05 NOTE — Progress Notes (Signed)
 Specialty Pharmacy Refill Coordination Note  [name] is a 73 y.o. female contacted today regarding refills of specialty medication(s) Etanercept  (Enbrel  SureClick)   Patient requested Delivery   Delivery date: 05/08/24   Verified address: [address] [name]   Medication will be filled on 05/07/24.  This fill date is pending response to refill request from provider. Patient is aware and if they have not received fill by intended date they must follow up with pharmacy.

## 2024-05-06 ENCOUNTER — Telehealth: Payer: Self-pay

## 2024-05-06 ENCOUNTER — Other Ambulatory Visit: Payer: Self-pay

## 2024-05-06 NOTE — Telephone Encounter (Signed)
 Patient called the office for a refill, advised that we have not had any labs since January and will need to get some updated labs. Patient stated that she would be in get labs this week.

## 2024-05-06 NOTE — Progress Notes (Signed)
 Refills denied - patient should contact office. Spoke with patient and provided office number for them to call.

## 2024-05-14 ENCOUNTER — Other Ambulatory Visit: Payer: Self-pay | Admitting: *Deleted

## 2024-05-14 ENCOUNTER — Other Ambulatory Visit: Payer: Self-pay | Admitting: Internal Medicine

## 2024-05-14 DIAGNOSIS — M069 Rheumatoid arthritis, unspecified: Secondary | ICD-10-CM

## 2024-05-14 DIAGNOSIS — M797 Fibromyalgia: Secondary | ICD-10-CM

## 2024-05-14 DIAGNOSIS — E041 Nontoxic single thyroid nodule: Secondary | ICD-10-CM

## 2024-05-14 DIAGNOSIS — G6289 Other specified polyneuropathies: Secondary | ICD-10-CM

## 2024-05-14 DIAGNOSIS — Z79899 Other long term (current) drug therapy: Secondary | ICD-10-CM

## 2024-05-14 NOTE — Telephone Encounter (Signed)
 Last Fill: 02/17/2024  Next Visit: 05/25/2024  Last Visit: 02/17/2024  Dx: Rheumatoid arthritis involving shoulder, unspecified laterality, unspecified whether rheumatoid factor present (HCC)   Current Dose per office note on 02/17/2024: Prednisone  5 mg daily as needed   Okay to refill Prednisone ?

## 2024-05-15 ENCOUNTER — Other Ambulatory Visit: Payer: Self-pay | Admitting: *Deleted

## 2024-05-15 ENCOUNTER — Ambulatory Visit: Payer: Self-pay | Admitting: Internal Medicine

## 2024-05-15 DIAGNOSIS — M069 Rheumatoid arthritis, unspecified: Secondary | ICD-10-CM

## 2024-05-15 DIAGNOSIS — Z79899 Other long term (current) drug therapy: Secondary | ICD-10-CM

## 2024-05-15 MED ORDER — ENBREL SURECLICK 50 MG/ML ~~LOC~~ SOAJ: 50.0000 mg | SUBCUTANEOUS | 0 refills | Status: DC

## 2024-05-15 NOTE — Telephone Encounter (Signed)
 Last Fill: 02/17/2024  Labs: 05/14/2024 Sed rate remains mildly elevated at 34 about the same as last time. Blood count and kidney and liver function tests look fine for continuing current medications. She has very slight anemia with hemoglobin of 10.5 and slightly low calcium  of 8.5 also the same as last time   TB Gold: 05/15/2024 in process   Next Visit: 05/25/2024  Last Visit: 02/17/2024  IK:Myzlfjunpi arthritis involving shoulder, unspecified laterality, unspecified whether rheumatoid factor present   Current Dose per office note 02/17/2024: Enbrel  50 mg subcu weekly - Plan: etanercept  (ENBREL  SURECLICK) 50 MG/ML injection   Okay to refill Enbrel ?

## 2024-05-15 NOTE — Progress Notes (Signed)
 Sed rate remains mildly elevated at 34 about the same as last time.  Blood count and kidney and liver function tests look fine for continuing current medications.  She has very slight anemia with hemoglobin of 10.5 and slightly low calcium  of 8.5 also the same as last time.

## 2024-05-18 LAB — COMPREHENSIVE METABOLIC PANEL WITH GFR
AG Ratio: 0.9 (calc) — ABNORMAL LOW (ref 1.0–2.5)
ALT: 12 U/L (ref 6–29)
AST: 17 U/L (ref 10–35)
Albumin: 3.6 g/dL (ref 3.6–5.1)
Alkaline phosphatase (APISO): 88 U/L (ref 37–153)
BUN: 11 mg/dL (ref 7–25)
CO2: 23 mmol/L (ref 20–32)
Calcium: 8.5 mg/dL — ABNORMAL LOW (ref 8.6–10.4)
Chloride: 109 mmol/L (ref 98–110)
Creat: 0.74 mg/dL (ref 0.60–1.00)
Globulin: 3.8 g/dL — ABNORMAL HIGH (ref 1.9–3.7)
Glucose, Bld: 97 mg/dL (ref 65–99)
Potassium: 4.1 mmol/L (ref 3.5–5.3)
Sodium: 140 mmol/L (ref 135–146)
Total Bilirubin: 0.2 mg/dL (ref 0.2–1.2)
Total Protein: 7.4 g/dL (ref 6.1–8.1)
eGFR: 85 mL/min/1.73m2 (ref >=60)

## 2024-05-18 LAB — SEDIMENTATION RATE: Sed Rate: 34 mm/h — ABNORMAL HIGH (ref 0–30)

## 2024-05-18 LAB — CBC WITH DIFFERENTIAL/PLATELET
Absolute Lymphocytes: 2554 {cells}/uL (ref 850–3900)
Absolute Monocytes: 479 {cells}/uL (ref 200–950)
Basophils Absolute: 51 {cells}/uL (ref 0–200)
Basophils Relative: 0.9 %
Eosinophils Absolute: 308 {cells}/uL (ref 15–500)
Eosinophils Relative: 5.4 %
HCT: 34.2 % — ABNORMAL LOW (ref 35.0–45.0)
Hemoglobin: 10.5 g/dL — ABNORMAL LOW (ref 11.7–15.5)
MCH: 26 pg — ABNORMAL LOW (ref 27.0–33.0)
MCHC: 30.7 g/dL — ABNORMAL LOW (ref 32.0–36.0)
MCV: 84.7 fL (ref 80.0–100.0)
MPV: 11 fL (ref 7.5–12.5)
Monocytes Relative: 8.4 %
Neutro Abs: 2309 {cells}/uL (ref 1500–7800)
Neutrophils Relative %: 40.5 %
Platelets: 191 Thousand/uL (ref 140–400)
RBC: 4.04 Million/uL (ref 3.80–5.10)
RDW: 14.6 % (ref 11.0–15.0)
Total Lymphocyte: 44.8 %
WBC: 5.7 Thousand/uL (ref 3.8–10.8)

## 2024-05-18 LAB — QUANTIFERON-TB GOLD PLUS
Mitogen-NIL: >10 [IU]/mL
NIL: 0.06 [IU]/mL
QuantiFERON-TB Gold Plus: NEGATIVE
TB1-NIL: 0 [IU]/mL
TB2-NIL: 0.01 [IU]/mL

## 2024-05-18 NOTE — Progress Notes (Signed)
 Office Visit Note  Patient: [name]             Date of Birth: [birth year]           MRN: [id number]             PCP: Adele Song, MD Referring: Adele Song, MD Visit Date: 05/25/2024   Subjective:  Follow-up   Discussed the use of AI scribe software for clinical note transcription with the patient, who gave verbal consent to proceed.  History of Present Illness   [name] is a 73 y.o. female here for follow up for seropositive erosive rheumatoid arthritis on Enbrel  50 mg [place] weekly and prednisone  5 mg daily.    She experiences persistent joint pain and swelling, particularly in her hands, despite resuming Enbrel  treatment last Monday after a brief lapse of less than a week. The swelling has not improved and remains unchanged. She experiences morning stiffness and numbness in her pinky finger, which she describes as 'going numb more than anything.'  She uses a cane due to balance issues and reports pain in her left wrist. She experiences pain in her hand daily, especially when performing activities. She is currently taking gabapentin  and Norco for pain management, although she does not notice significant relief from gabapentin , which she has been on 'since forever.'  She mentions a recent cold about three weeks ago, characterized by a cough, but she did not require antibiotics and did not feel it was severe. No palpitations are reported.  Her current medications include Enbrel , gabapentin , Norco, and citalopram  (Celexa ). She is also taking folic acid .     She had recent lab work 2 weeks ago with kidney liver function test in the normal range and stable mild anemia.  Sed rate of 34 was also stable.   Previous HPI 02/17/2024 [name] is a 73 y.o. female here for follow up for seropositive erosive rheumatoid arthritis on Enbrel  50 mg [place] weekly and prednisone  5 mg daily.    She has been experiencing significant increased leg pain for the past one to two months,  describing it as severe enough to prevent walking and getting out of bed. The pain is constant and associated with increased swelling in the legs. She has attempted to manage the pain with stretching, but finds it difficult due to its severity.   Her history of rheumatoid arthritis was previously managed with Enbrel  and prednisone , which were effective in controlling her symptoms. However, she is no longer receiving Enbrel  injections and is unsure of the reason for discontinuation. The absence of Enbrel  has led to worsening joint symptoms, including pain in her hands and legs.   She reports difficulty with range of motion, particularly in her wrists, and experiences pain when attempting to make a fist. She describes the pain in her wrists as affecting the 'sinus bone'.   Her current medication regimen includes a small dose of prednisone , which provides temporary relief for her symptoms. She wants to resume her previous medication regimen to improve her mobility and reduce pain.     Previous HPI 11/19/2023 [name] is a 73 y.o. female here for follow up for seropositive erosive rheumatoid arthritis on Enbrel  50 mg [place] weekly.  She patient presents with hand pain and stiffness.   She experiences ongoing pain and stiffness in her hands, primarily across the palms and knuckles, with difficulty straightening her fingers. The symptoms have been persistent, fluctuating weekly, and today is similar to her typical day  over the past month, with significant discomfort when attempting to flatten her hand. No recent upper respiratory infections or swelling in her legs or knees.   She has not taken any steroids since her last visit and is not using any over-the-counter medications for her symptoms. She is currently on Enbrel , administered as a weekly shot.   She has not previously seen an occupational therapist for her hand issues. She is concerned about the potential for her hand to become claw-like due to  the persistent stiffness and pain.      Previous HPI 08/19/2023 [name] is a 73 y.o. female here for follow up for seropositive erosive rheumatoid arthritis.  We started on leflunomide  20 mg daily after last visit but she had to stop this due to pretty severe GI intolerance.  So far on no RA treatment she is having a lot of joint problems with pain and swelling in the left hand and wrist and decreased grip function on both hands.  Staying stiff for several hours daily.   Previous HPI 06/18/2023 [name] is a 73 y.o. female here for follow up for seropositive erosive rheumatoid arthritis.  Lab testing initial visit showed mildly elevated sedimentation rate 34 however this is better than previous labs reviewed preceding months.  Changes consistent with longstanding erosive disease extensive throughout MTP joints.  Chest x-ray was normal.  She did not feel a very great relief in symptoms with the course of oral prednisone .  Still has pain and stiffness and swelling of multiple joints but especially tenderness with any firm pressure or with use.   Previous HPI 05/28/23 [name] is a 73 y.o. female here for evaluation management of seropositive erosive rheumatoid arthritis.  Original diagnosis in 2016 with joint pain and swelling in multiple areas including both elbows hands and right knee.  Initial treatments include methotrexate  and prednisone .  Subsequently tried increase in methotrexate  dose and switching to subcutaneous route with only partial disease response.  She was started on Humira in January 2019 with some disease response subsequently increased to weekly dosing in August 2019.  This was then disrupted and not following regularly starting due to coronavirus.  Off RA medication her worst problem is pain and swelling in the left hand.  She is also noticing some progressive worsening of changes at the MCP joints and lateral deviation in her fingers.  However she is having pretty much  widespread pain throughout upper and lower extremities.  She takes gabapentin  300 mg twice daily and has been on chronic low-dose hydrocodone  and back injections treatments used for chronic pain.     Labs reviewed 10/2017 HBV neg HCV neg   Imaging reviewed  10/2018 DEXA with osteopenia - FRAX 19%, 3.6%   Review of Systems  Constitutional:  Negative for fatigue.  HENT:  Negative for mouth sores and mouth dryness.   Eyes:  Negative for dryness.  Respiratory:  Negative for shortness of breath.   Cardiovascular:  Negative for chest pain and palpitations.  Gastrointestinal:  Negative for blood in stool, constipation and diarrhea.  Endocrine: Negative for increased urination.  Genitourinary:  Negative for involuntary urination.  Musculoskeletal:  Positive for joint pain, joint pain, joint swelling, myalgias, muscle weakness, morning stiffness, muscle tenderness and myalgias. Negative for gait problem.  Skin:  Negative for color change, rash, hair loss and sensitivity to sunlight.  Allergic/Immunologic: Negative for susceptible to infections.  Neurological:  Positive for headaches. Negative for dizziness.  Hematological:  Negative for swollen glands.  Psychiatric/Behavioral:  Positive for depressed mood and sleep disturbance. The patient is nervous/anxious.     PMFS History:  Patient Active Problem List   Diagnosis Date Noted   Malaise and fatigue 07/04/2023   High risk medication use 05/28/2023   Thyroid  nodule 12/31/2022   Syncope and collapse 02/28/2021   Peripheral neuropathy 01/03/2021   Dyspnea on exertion 08/04/2020   CVA (cerebral vascular accident) (HCC) 09/09/2019   Osteopenia of neck of femur 02/05/2019   Coronary artery disease 10/16/2018   Hypersomnia with sleep apnea 04/18/2017   Post traumatic stress disorder (PTSD) 04/18/2017   COPD mixed type (HCC) 04/18/2017   Fibromyalgia 08/18/2015   Rheumatoid arthritis (HCC) 03/09/2015   Essential hypertension, benign  02/18/2015   Cervical disc disorder with radiculopathy of cervical region 01/21/2015   History of colonic polyps 03/01/2014   Dyshidrotic eczema 12/29/2013   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   Chronic migraine without aura, with intractable migraine, so stated, with status migrainosus 07/29/2007   DEPENDENCE, BARB/SED, CONTINUOUS 06/24/2007   HYPERLIPIDEMIA 12/19/2006   OBESITY, NOS 12/19/2006   Depression with anxiety 12/19/2006   PANIC ATTACKS 12/19/2006   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   COPD (chronic obstructive pulmonary disease) (HCC)    Depression    Ear pain, bilateral 03/30/2019   Fibromyalgia    08/18/15  [facility]   GERD (gastroesophageal reflux disease)    Headache 05/27/2013   History of CVA (cerebrovascular accident)    11 minni srokes   Hyperlipidemia    Hypertension    Migraine    Neck pain 02/21/2012   Personal history of colonic polyp - adenoma 03/01/2014   Personality disorder (HCC)    Productive cough 11/09/2020   Rash and nonspecific skin eruption 08/04/2020   Rheumatoid arthritis (HCC)    08/18/15  [facility]   Seizures (HCC)    last seizure 4 years ago   Sleep apnea    Mild. No need for CPAP.   Stroke [facility])     Family History  Problem Relation Age of Onset   Cirrhosis Mother    Diabetes Mother    Cirrhosis Father    Breast cancer Sister 66   Seizures Sister    Breast cancer Sister 51   Migraines Sister    Colon cancer Neg Hx    Past Surgical History:  Procedure Laterality Date   ANKLE SURGERY Left    APPENDECTOMY     BACK SURGERY     BUNIONECTOMY     2 toes   CARDIAC CATHETERIZATION  2008   Normal. Dr Alveta.    CHOLECYSTECTOMY     CYST EXCISION     leg   ESOPHAGOGASTRODUODENOSCOPY (EGD) WITH PROPOFOL  N/A 06/27/2018   Procedure: ESOPHAGOGASTRODUODENOSCOPY (EGD) WITH PROPOFOL ;  Surgeon: Therisa Bi, MD;  Location: [facility] ENDOSCOPY;  Service: Gastroenterology;  Laterality: N/A;   FLANK MASS EXCISION     FOOT TENDON SURGERY  june 2014   both feet   KNEE SURGERY     Bilateral.   NOSE SURGERY     SHOULDER SURGERY     bil.   TOTAL ABDOMINAL HYSTERECTOMY     Social History  Social History Narrative   Patient lives at home with her husband Sudie.    Right Handed   Drinks no caffeine   Immunization History  Administered Date(s) Administered   Fluad Quad(high Dose 65+) 08/03/2020, 12/28/2022   Fluad Trivalent(High Dose 65+) 06/27/2023   Influenza Split 08/21/2011   Influenza Whole 07/29/2007, 07/29/2008, 11/07/2009   Influenza,inj,Quad PF,6+ Mos 07/31/2013, 10/16/2018   Influenza-Unspecified 08/18/2015   PFIZER(Purple Top)SARS-COV-2 Vaccination 12/24/2019, 01/14/2020   Pneumococcal Conjugate-13 08/18/2015   Pneumococcal Polysaccharide-23 07/29/2007, 02/15/2011, 12/15/2015     Objective: Vital Signs: BP 94/65 (BP Location: Right Arm, Patient Position: Sitting, Cuff Size: Large)   Pulse 75   Resp 17   Ht 5' 5 (1.651 m)   Wt 218 lb 9.6 oz (99.2 kg)   BMI 36.38 kg/m    Physical Exam Eyes:     Conjunctiva/sclera: Conjunctivae normal.  Cardiovascular:     Rate and Rhythm: Normal rate and regular rhythm.  Pulmonary:     Effort: Pulmonary effort is normal.     Breath sounds: Normal breath sounds.  Musculoskeletal:     Right lower leg: No edema.     Left lower leg: No edema.  Skin:    General: Skin is warm and dry.  Neurological:     Mental Status: She is alert.  Psychiatric:        Mood and Affect: Mood normal.      Musculoskeletal Exam:  Shoulders decreased overhead range of motion, no focal tenderness to pressure Elbows Limited extension range of motion bilaterally, no  focal tenderness or palpable swelling Wrists full ROM, left wrist swelling mild tenderness to pressure Fingers full ROM, MCP joint tenderness to pressure, reducible lateral deviation, left fifth PIP joint swelling No paraspinal tenderness to palpation over upper and lower back Hip normal internal and external rotation without pain, no tenderness to lateral hip palpation Knees full ROM no tenderness or swelling Ankles full ROM no tenderness or swelling   Investigation: No additional findings.  Imaging: No results found.  Recent Labs: Lab Results  Component Value Date   WBC 5.7 05/14/2024   HGB 10.5 (L) 05/14/2024   PLT 191 05/14/2024   NA 140 05/14/2024   K 4.1 05/14/2024   CL 109 05/14/2024   CO2 23 05/14/2024   GLUCOSE 97 05/14/2024   BUN 11 05/14/2024   CREATININE 0.74 05/14/2024   BILITOT 0.2 05/14/2024   ALKPHOS 135 (H) 07/04/2023   AST 17 05/14/2024   ALT 12 05/14/2024   PROT 7.4 05/14/2024   ALBUMIN 4.0 07/04/2023   CALCIUM  8.5 (L) 05/14/2024   GFRAA 93 07/28/2020   QFTBGOLDPLUS NEGATIVE 05/14/2024    Speciality Comments: No specialty comments available.  Procedures:  No procedures performed Allergies: Tetanus toxoids, Alendronate, Codeine phosphate, Flexeril  [cyclobenzaprine ], Ibuprofen , and Penicillins   Assessment / Plan:     Visit Diagnoses: Rheumatoid arthritis involving shoulder, unspecified laterality, unspecified whether rheumatoid factor present (HCC) - Plan: etanercept  (ENBREL  SURECLICK) 50 MG/ML injection, methotrexate  (RHEUMATREX) 2.5 MG tablet Persistent joint swelling and stiffness in left wrist and pinky finger despite Enbrel . Elevated inflammation markers indicate incomplete disease control.  Remains in low to moderate disease activity with Enbrel  despite low-dose prednisone . - Continue Enbrel  50 mg subcu weekly - Add methotrexate  10 mg p.o. weekly  - Continue prednisone  5 mg daily as needed  High risk medication use - Enbrel  50 mg subcu  weekly - Plan: etanercept  (ENBREL  SURECLICK) 50 MG/ML injection Recent labs reviewed blood count metabolic panel were  appropriate no problem for continuing the Enbrel .  Also would not have any known contraindication to the methotrexate .  Long term (current) use of systemic steroids - Prednisone  5 mg daily as needed  Peripheral neuropathy Numbness and pain in pinky finger not fully controlled with current regimen.  Chronic pain syndrome Widespread pain not limited to joints. Current regimen includes gabapentin  and Norco with questionable effectiveness. Limited alternatives due to medication overlap.      Orders: No orders of the defined types were placed in this encounter.  Meds ordered this encounter  Medications   etanercept  (ENBREL  SURECLICK) 50 MG/ML injection    Sig: Inject 50 mg into the skin once a week.    Dispense:  12 mL    Refill:  0   methotrexate  (RHEUMATREX) 2.5 MG tablet    Sig: Take 4 tablets (10 mg total) by mouth once a week. Caution:Chemotherapy. Protect from light.    Dispense:  52 tablet    Refill:  0     Follow-Up Instructions: Return in about 3 months (around 08/25/2024) for RA on ENB/GC/MTX resume f/u 3mos.   Lonni LELON Ester, MD  Note - This record has been created using [employer].  Chart creation errors have been sought, but may not always  have been located. Such creation errors do not reflect on  the standard of medical care.

## 2024-05-22 ENCOUNTER — Other Ambulatory Visit: Payer: Self-pay | Admitting: Family Medicine

## 2024-05-22 DIAGNOSIS — I10 Essential (primary) hypertension: Secondary | ICD-10-CM

## 2024-05-25 ENCOUNTER — Ambulatory Visit: Attending: Internal Medicine | Admitting: Internal Medicine

## 2024-05-25 ENCOUNTER — Other Ambulatory Visit: Payer: Self-pay | Admitting: Neurology

## 2024-05-25 ENCOUNTER — Encounter: Payer: Self-pay | Admitting: Internal Medicine

## 2024-05-25 VITALS — BP 94/65 | HR 75 | Resp 17 | Ht 65.0 in | Wt 218.6 lb

## 2024-05-25 DIAGNOSIS — M069 Rheumatoid arthritis, unspecified: Secondary | ICD-10-CM | POA: Diagnosis not present

## 2024-05-25 DIAGNOSIS — Z7952 Long term (current) use of systemic steroids: Secondary | ICD-10-CM | POA: Diagnosis not present

## 2024-05-25 DIAGNOSIS — M797 Fibromyalgia: Secondary | ICD-10-CM | POA: Diagnosis not present

## 2024-05-25 DIAGNOSIS — Z79899 Other long term (current) drug therapy: Secondary | ICD-10-CM | POA: Diagnosis not present

## 2024-05-25 MED ORDER — METHOTREXATE SODIUM 2.5 MG PO TABS: 10.0000 mg | ORAL_TABLET | ORAL | 0 refills | Status: DC

## 2024-05-25 MED ORDER — ENBREL SURECLICK 50 MG/ML ~~LOC~~ SOAJ
50.0000 mg | SUBCUTANEOUS | 0 refills | Status: DC
  Filled 2024-05-26 – 2024-06-03 (×4): qty 4, 28d supply, fill #0
  Filled 2024-07-03: qty 4, 28d supply, fill #1
  Filled 2024-07-30: qty 4, 28d supply, fill #2

## 2024-05-26 ENCOUNTER — Other Ambulatory Visit (HOSPITAL_COMMUNITY): Payer: Self-pay

## 2024-05-26 ENCOUNTER — Other Ambulatory Visit: Payer: Self-pay

## 2024-06-02 ENCOUNTER — Other Ambulatory Visit: Payer: Self-pay

## 2024-06-02 ENCOUNTER — Telehealth: Payer: Self-pay

## 2024-06-02 NOTE — Progress Notes (Signed)
 Sed rate of 34 remains slightly elevated although partially better compared to 6 months ago.  Hemoglobin 10.5 is mild anemia the same as before.  Kidney and liver function test are normal.  No problem for continuing Enbrel  and methotrexate .

## 2024-06-02 NOTE — Telephone Encounter (Signed)
 Received notification from [facility] pharmacy that patient requires a new authorization for their medication.  Created an URGENT Prior Authorization request to [employer] for ENBREL  via CoverMyMeds. Will submit once chart note has been completed and signed.  Key: [id number]

## 2024-06-02 NOTE — Telephone Encounter (Signed)
 Note is signed now, sorry for delay

## 2024-06-03 ENCOUNTER — Other Ambulatory Visit: Payer: Self-pay

## 2024-06-03 ENCOUNTER — Other Ambulatory Visit (HOSPITAL_COMMUNITY): Payer: Self-pay

## 2024-06-03 NOTE — Telephone Encounter (Signed)
 Received notification from [facility] MEDICAID regarding a prior authorization for ENBREL . Authorization has been APPROVED from 06/03/2024 to 10/21/2024. Approval letter sent to scan center.  Authorization # [id number]  Sherry Pennant, PharmD, MPH, BCPS, CPP Clinical Pharmacist (Rheumatology and Pulmonology)

## 2024-06-03 NOTE — Progress Notes (Signed)
 Specialty Pharmacy Refill Coordination Note  [name] is a 73 y.o. female contacted today regarding refills of specialty medication(s) Etanercept  (Enbrel  SureClick)   Patient requested Delivery   Delivery date: 06/09/24   Verified address: [address] [place]   Medication will be filled on 06/08/24.

## 2024-06-03 NOTE — Telephone Encounter (Signed)
 Submitted an URGENT Prior Authorization request to [facility] MEDICAID for ENBREL  via CoverMyMeds. Will update once we receive a response.  Key: [id number]

## 2024-06-08 ENCOUNTER — Other Ambulatory Visit: Payer: Self-pay

## 2024-06-09 ENCOUNTER — Other Ambulatory Visit: Payer: Self-pay | Admitting: Internal Medicine

## 2024-06-09 DIAGNOSIS — M069 Rheumatoid arthritis, unspecified: Secondary | ICD-10-CM

## 2024-06-09 DIAGNOSIS — Z79899 Other long term (current) drug therapy: Secondary | ICD-10-CM

## 2024-06-25 ENCOUNTER — Other Ambulatory Visit: Payer: Self-pay | Admitting: Family Medicine

## 2024-06-25 DIAGNOSIS — K219 Gastro-esophageal reflux disease without esophagitis: Secondary | ICD-10-CM

## 2024-06-29 ENCOUNTER — Other Ambulatory Visit: Payer: Self-pay

## 2024-07-03 ENCOUNTER — Other Ambulatory Visit: Payer: Self-pay

## 2024-07-06 ENCOUNTER — Other Ambulatory Visit: Payer: Self-pay

## 2024-07-07 ENCOUNTER — Other Ambulatory Visit: Payer: Self-pay

## 2024-07-08 ENCOUNTER — Other Ambulatory Visit (HOSPITAL_COMMUNITY): Payer: Self-pay

## 2024-07-09 ENCOUNTER — Other Ambulatory Visit: Payer: Self-pay | Admitting: Pharmacy Technician

## 2024-07-09 ENCOUNTER — Other Ambulatory Visit: Payer: Self-pay

## 2024-07-09 NOTE — Progress Notes (Signed)
 Specialty Pharmacy Refill Coordination Note  [name] is a 73 y.o. female contacted today regarding refills of specialty medication(s) Etanercept  (Enbrel  SureClick)   Patient requested Delivery   Delivery date: 07/10/24   Verified address: [address]  [place]   Medication will be filled on 07/09/24.

## 2024-07-13 ENCOUNTER — Other Ambulatory Visit: Payer: Self-pay | Admitting: Family Medicine

## 2024-07-13 DIAGNOSIS — R058 Other specified cough: Secondary | ICD-10-CM

## 2024-07-15 ENCOUNTER — Other Ambulatory Visit: Payer: Self-pay | Admitting: Family Medicine

## 2024-07-15 DIAGNOSIS — F418 Other specified anxiety disorders: Secondary | ICD-10-CM

## 2024-07-30 ENCOUNTER — Other Ambulatory Visit: Payer: Self-pay

## 2024-07-30 NOTE — Progress Notes (Signed)
 Specialty Pharmacy Refill Coordination Note  [name] is a 73 y.o. female contacted today regarding refills of specialty medication(s) Etanercept  (Enbrel  SureClick)   Patient requested Delivery   Delivery date: 08/06/24   Verified address: [address]  [place]   Medication will be filled on 10.15.25.

## 2024-08-04 ENCOUNTER — Encounter: Payer: Self-pay | Admitting: Family Medicine

## 2024-08-04 ENCOUNTER — Ambulatory Visit (INDEPENDENT_AMBULATORY_CARE_PROVIDER_SITE_OTHER): Admitting: Family Medicine

## 2024-08-04 VITALS — BP 102/72 | HR 67 | Ht 65.0 in | Wt 214.0 lb

## 2024-08-04 DIAGNOSIS — Z23 Encounter for immunization: Secondary | ICD-10-CM

## 2024-08-04 DIAGNOSIS — M797 Fibromyalgia: Secondary | ICD-10-CM

## 2024-08-04 DIAGNOSIS — G8929 Other chronic pain: Secondary | ICD-10-CM | POA: Diagnosis not present

## 2024-08-04 DIAGNOSIS — M85859 Other specified disorders of bone density and structure, unspecified thigh: Secondary | ICD-10-CM

## 2024-08-04 DIAGNOSIS — G6289 Other specified polyneuropathies: Secondary | ICD-10-CM | POA: Diagnosis not present

## 2024-08-04 DIAGNOSIS — E785 Hyperlipidemia, unspecified: Secondary | ICD-10-CM

## 2024-08-04 MED ORDER — HYDROCODONE-ACETAMINOPHEN 5-325 MG PO TABS: ORAL_TABLET | ORAL | 0 refills | Status: DC

## 2024-08-04 NOTE — Assessment & Plan Note (Signed)
 Patient's pain was moderately well-controlled on Norco 10-325 for a long time.  Patient was seeing pain management specialist for this recently, however has not been able to comply with monthly appointments.  Explained that I am comfortable taking over her pain management and that the expectation will be to have an appointment every 3 months once pain control is more stable.  PDMP reviewed and appropriate.  Collecting tox assure today.  Will restart patient's pain medicine with lower dose of Norco 5-325 to see if this manages her pain adequately.  Advised patient to schedule 1 month follow-up to check in about pain and titrate as needed.
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
## 2024-08-04 NOTE — Patient Instructions (Addendum)
 Thank you for coming in today! Here is a summary of what we discussed:  - I ordered repeat bone scan for you.  You can go to [facility] imaging to have this done anytime, [address] or at [facility], [facility], [facility] to have this completed.  You do not need an appointment, but if you would like to call them beforehand, their number is [phone].  We will contact you with your results afterwards.   - I have sent in a prescription for the Norco.  We are starting at a lower dose at first.  Please schedule follow-up appointment in 1 month so we can see how things are going and I can provide more medication at that time.  We are checking some labs today. If they are abnormal, I will call you. If they are normal, I will send you a MyChart message (if it is active) or a letter in the mail. If you do not hear about your labs in the next 2 weeks, please call the office.   Please call the clinic at [phone] if your symptoms worsen or you have any concerns.  Best, Dr Adele

## 2024-08-05 ENCOUNTER — Other Ambulatory Visit

## 2024-08-05 ENCOUNTER — Other Ambulatory Visit: Payer: Self-pay

## 2024-08-06 ENCOUNTER — Telehealth: Payer: Self-pay

## 2024-08-06 ENCOUNTER — Other Ambulatory Visit: Payer: Self-pay | Admitting: Internal Medicine

## 2024-08-06 DIAGNOSIS — M069 Rheumatoid arthritis, unspecified: Secondary | ICD-10-CM

## 2024-08-06 NOTE — Telephone Encounter (Signed)
 Last Fill: 05/14/2024  Next Visit: 09/07/2024  Last Visit: 05/25/2024  Dx: Rheumatoid arthritis involving shoulder, unspecified laterality, unspecified whether rheumatoid factor present   Current Dose per office note on 05/25/2024: Prednisone  5 mg daily as needed   Okay to refill Prednisone ?

## 2024-08-06 NOTE — Telephone Encounter (Signed)
 Prior authorization submitted for HYDROcodone -Acetaminophen  5-325MG  tablets to [employer] via [employer].   Key: [id number]

## 2024-08-10 ENCOUNTER — Other Ambulatory Visit: Payer: Self-pay | Admitting: Family Medicine

## 2024-08-10 ENCOUNTER — Ambulatory Visit: Payer: Self-pay | Admitting: Family Medicine

## 2024-08-10 DIAGNOSIS — G6289 Other specified polyneuropathies: Secondary | ICD-10-CM

## 2024-08-10 LAB — OPIATE CLASS, MS, UR RFX
Codeine: NOT DETECTED ng/mg{creat}
Dihydrocodeine: 92 ng/mg{creat}
Hydrocodone: 260 ng/mg{creat}
Hydromorphone: 86 ng/mg{creat}
Morphine: NOT DETECTED ng/mg{creat}
Norcodeine: NOT DETECTED ng/mg{creat}
Norhydrocodone: 2332 ng/mg{creat}
Normorphine: NOT DETECTED ng/mg{creat}
Opiate Class Confirmation: POSITIVE — AB

## 2024-08-10 LAB — BARBITURATES, MS, UR RFX
Amobarbital: NOT DETECTED
Barbital: NOT DETECTED
Barbiturates Confirmation: POSITIVE — AB
Butabarbital: NOT DETECTED
Butalbital: NOT DETECTED
Mephobarbital: NOT DETECTED
Pentobarbital: NOT DETECTED
Secobarbital: NOT DETECTED
Thiopental: NOT DETECTED

## 2024-08-10 LAB — TOXASSURE FLEX 15, UR
6-ACETYLMORPHINE IA: NEGATIVE ng/mL
7-aminoclonazepam: NOT DETECTED ng/mg{creat}
AMPHETAMINES IA: NEGATIVE ng/mL
Alpha-hydroxyalprazolam: NOT DETECTED ng/mg{creat}
Alpha-hydroxymidazolam: NOT DETECTED ng/mg{creat}
Alpha-hydroxytriazolam: NOT DETECTED ng/mg{creat}
Alprazolam: NOT DETECTED ng/mg{creat}
Buprenorphine: NOT DETECTED ng/mg{creat}
CANNABINOIDS IA: NEGATIVE ng/mL
COCAINE METABOLITE IA: NEGATIVE ng/mL
Clonazepam: NOT DETECTED ng/mg{creat}
Creatinine: 63 mg/dL (ref >=20)
Desalkylflurazepam: NOT DETECTED ng/mg{creat}
Desmethyldiazepam: NOT DETECTED ng/mg{creat}
Desmethylflunitrazepam: NOT DETECTED ng/mg{creat}
Diazepam: NOT DETECTED ng/mg{creat}
ETHYL ALCOHOL Enzymatic: NEGATIVE g/dL
Fentanyl: NOT DETECTED ng/mg{creat}
Flunitrazepam: NOT DETECTED ng/mg{creat}
Lorazepam: NOT DETECTED ng/mg{creat}
METHADONE IA: NEGATIVE ng/mL
METHADONE MTB IA: NEGATIVE ng/mL
Midazolam: NOT DETECTED ng/mg{creat}
Norbuprenorphine: NOT DETECTED ng/mg{creat}
Norfentanyl: NOT DETECTED ng/mg{creat}
OXYCODONE CLASS IA: NEGATIVE ng/mL
Oxazepam: NOT DETECTED ng/mg{creat}
PHENCYCLIDINE IA: NEGATIVE ng/mL
TAPENTADOL, IA: NEGATIVE ng/mL
TRAMADOL IA: NEGATIVE ng/mL
Temazepam: NOT DETECTED ng/mg{creat}

## 2024-08-10 LAB — LIPID PANEL
Chol/HDL Ratio: 2 ratio (ref 0.0–4.4)
Cholesterol, Total: 119 mg/dL (ref 100–199)
HDL: 59 mg/dL (ref >39)
LDL Chol Calc (NIH): 44 mg/dL (ref 0–99)
Triglycerides: 79 mg/dL (ref 0–149)
VLDL Cholesterol Cal: 16 mg/dL (ref 5–40)

## 2024-08-10 NOTE — Telephone Encounter (Signed)
 Pharmacy Patient Advocate Encounter  Received notification from [facility] that Prior Authorization for HYDROCO/APAP TAB 5-325MG  has been APPROVED from 08/06/24 to 09/05/24   PA #/Case ID/Reference #: [id number]

## 2024-08-11 NOTE — Telephone Encounter (Signed)
 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 During visit patient stated she had not taking Norco in 2 months due to running out. Left HIPAA compliant voicemail requesting call back. Will attempt to reach patient again as able this week.

## 2024-08-14 ENCOUNTER — Telehealth: Payer: Self-pay | Admitting: Family Medicine

## 2024-08-14 NOTE — Telephone Encounter (Signed)
 Called patient to follow up about ToxAssure results, which showed opioid metabolites. Pt stated that she had not taken Norco in 2 months prior to the 10/14 visit. Confirmed patient's name and DOB. Pt stated she had not taken any Norco or other pain medicine (other than Tylenol ) any time close to her visit. Advised follow up with me in Nov- scheduled patient for 11/7. Forwarded ToxAssure result to [facility] pharmacist to determine if any other meds on her list could have resulted in a false positive.

## 2024-08-19 ENCOUNTER — Other Ambulatory Visit: Payer: Self-pay | Admitting: Internal Medicine

## 2024-08-19 DIAGNOSIS — M069 Rheumatoid arthritis, unspecified: Secondary | ICD-10-CM

## 2024-08-19 NOTE — Telephone Encounter (Signed)
 Last Fill: 05/25/2024  Labs: 05/14/2024 Sed rate remains mildly elevated at 34 about the same as last time. Blood count and kidney and liver function tests look fine for continuing current medications. She has very slight anemia with hemoglobin of 10.5 and slightly low calcium  of 8.5 also the same as last time.   Next Visit: 09/07/2024  Last Visit: 05/25/2024  DX: Rheumatoid arthritis involving shoulder, unspecified laterality, unspecified whether rheumatoid factor present   Current Dose per office note 05/25/2024: methotrexate  10 mg p.o. weekly   Okay to refill Methotrexate ?

## 2024-08-25 NOTE — Progress Notes (Deleted)
 Office Visit Note  Patient: [name]             Date of Birth: 1951-03-16           MRN: [id number]             PCP: Adele Song, MD Referring: Adele Song, MD Visit Date: 09/07/2024   Subjective:  No chief complaint on file.   History of Present Illness: [name] is a 73 y.o. female here for follow up for seropositive erosive rheumatoid arthritis on Enbrel  50 mg [place] weekly and prednisone  5 mg daily.   Previous HPI 05/25/2024 [name] is a 73 y.o. female here for follow up for seropositive erosive rheumatoid arthritis on Enbrel  50 mg [place] weekly and prednisone  5 mg daily.     She experiences persistent joint pain and swelling, particularly in her hands, despite resuming Enbrel  treatment last Monday after a brief lapse of less than a week. The swelling has not improved and remains unchanged. She experiences morning stiffness and numbness in her pinky finger, which she describes as 'going numb more than anything.'   She uses a cane due to balance issues and reports pain in her left wrist. She experiences pain in her hand daily, especially when performing activities. She is currently taking gabapentin  and Norco for pain management, although she does not notice significant relief from gabapentin , which she has been on 'since forever.'   She mentions a recent cold about three weeks ago, characterized by a cough, but she did not require antibiotics and did not feel it was severe. No palpitations are reported.   Her current medications include Enbrel , gabapentin , Norco, and citalopram  (Celexa ). She is also taking folic acid .     She had recent lab work 2 weeks ago with kidney liver function test in the normal range and stable mild anemia.  Sed rate of 34 was also stable.     Previous HPI 02/17/2024 [name] is a 73 y.o. female here for follow up for seropositive erosive rheumatoid arthritis on Enbrel  50 mg [place] weekly and prednisone  5 mg daily.    She has been  experiencing significant increased leg pain for the past one to two months, describing it as severe enough to prevent walking and getting out of bed. The pain is constant and associated with increased swelling in the legs. She has attempted to manage the pain with stretching, but finds it difficult due to its severity.   Her history of rheumatoid arthritis was previously managed with Enbrel  and prednisone , which were effective in controlling her symptoms. However, she is no longer receiving Enbrel  injections and is unsure of the reason for discontinuation. The absence of Enbrel  has led to worsening joint symptoms, including pain in her hands and legs.   She reports difficulty with range of motion, particularly in her wrists, and experiences pain when attempting to make a fist. She describes the pain in her wrists as affecting the 'sinus bone'.   Her current medication regimen includes a small dose of prednisone , which provides temporary relief for her symptoms. She wants to resume her previous medication regimen to improve her mobility and reduce pain.     Previous HPI 11/19/2023 [name] is a 73 y.o. female here for follow up for seropositive erosive rheumatoid arthritis on Enbrel  50 mg [place] weekly.  She patient presents with hand pain and stiffness.   She experiences ongoing pain and stiffness in her hands, primarily across the palms and knuckles, with  difficulty straightening her fingers. The symptoms have been persistent, fluctuating weekly, and today is similar to her typical day over the past month, with significant discomfort when attempting to flatten her hand. No recent upper respiratory infections or swelling in her legs or knees.   She has not taken any steroids since her last visit and is not using any over-the-counter medications for her symptoms. She is currently on Enbrel , administered as a weekly shot.   She has not previously seen an occupational therapist for her hand issues.  She is concerned about the potential for her hand to become claw-like due to the persistent stiffness and pain.      Previous HPI 08/19/2023 [name] is a 73 y.o. female here for follow up for seropositive erosive rheumatoid arthritis.  We started on leflunomide  20 mg daily after last visit but she had to stop this due to pretty severe GI intolerance.  So far on no RA treatment she is having a lot of joint problems with pain and swelling in the left hand and wrist and decreased grip function on both hands.  Staying stiff for several hours daily.   Previous HPI 06/18/2023 [name] is a 73 y.o. female here for follow up for seropositive erosive rheumatoid arthritis.  Lab testing initial visit showed mildly elevated sedimentation rate 34 however this is better than previous labs reviewed preceding months.  Changes consistent with longstanding erosive disease extensive throughout MTP joints.  Chest x-ray was normal.  She did not feel a very great relief in symptoms with the course of oral prednisone .  Still has pain and stiffness and swelling of multiple joints but especially tenderness with any firm pressure or with use.   Previous HPI 05/28/23 [name] is a 73 y.o. female here for evaluation management of seropositive erosive rheumatoid arthritis.  Original diagnosis in 2016 with joint pain and swelling in multiple areas including both elbows hands and right knee.  Initial treatments include methotrexate  and prednisone .  Subsequently tried increase in methotrexate  dose and switching to subcutaneous route with only partial disease response.  She was started on Humira in January 2019 with some disease response subsequently increased to weekly dosing in August 2019.  This was then disrupted and not following regularly starting due to coronavirus.  Off RA medication her worst problem is pain and swelling in the left hand.  She is also noticing some progressive worsening of changes at the MCP  joints and lateral deviation in her fingers.  However she is having pretty much widespread pain throughout upper and lower extremities.  She takes gabapentin  300 mg twice daily and has been on chronic low-dose hydrocodone  and back injections treatments used for chronic pain.     Labs reviewed 10/2017 HBV neg HCV neg   Imaging reviewed  10/2018 DEXA with osteopenia - FRAX 19%, 3.6%   No Rheumatology ROS completed.   PMFS History:  Patient Active Problem List   Diagnosis Date Noted   High risk medication use 05/28/2023   Peripheral neuropathy 01/03/2021   CVA (cerebral vascular accident) (HCC) 09/09/2019   Osteopenia of neck of femur 02/05/2019   Coronary artery disease 10/16/2018   Hypersomnia with sleep apnea 04/18/2017   Post traumatic stress disorder (PTSD) 04/18/2017   COPD mixed type (HCC) 04/18/2017   Fibromyalgia 08/18/2015   Rheumatoid arthritis (HCC) 03/09/2015   Essential hypertension, benign 02/18/2015   Cervical disc disorder with radiculopathy of cervical region 01/21/2015   History of colonic polyps 03/01/2014  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   Chronic migraine without aura, with intractable migraine, so stated, with status migrainosus 07/29/2007   HYPERLIPIDEMIA 12/19/2006   OBESITY, NOS 12/19/2006   Depression with anxiety 12/19/2006   PANIC ATTACKS 12/19/2006   REFLUX ESOPHAGITIS 12/19/2006    Past Medical History:  Diagnosis Date   After cataract 07/22/2014   Asthma    Bilateral arm pain 01/13/2015   BORDERLINE PERSONALITY 12/19/2006   Qualifier: Diagnosis of   By: Damien Folks         Chronic low back pain    Collagen vascular disease    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   COPD (chronic obstructive pulmonary disease) (HCC)    Depression    Ear pain, bilateral 03/30/2019   Fibromyalgia    08/18/15  [facility]    GERD (gastroesophageal reflux disease)    Headache 05/27/2013   History of CVA (cerebrovascular accident)    11 minni srokes   Hyperlipidemia    Hypertension    Migraine    Neck pain 02/21/2012   Personal history of colonic polyp - adenoma 03/01/2014   Personality disorder (HCC)    Productive cough 11/09/2020   Rash and nonspecific skin eruption 08/04/2020   Rheumatoid arthritis (HCC)    08/18/15  [facility]   Seizures (HCC)    last seizure 4 years ago   Sleep apnea    Mild. No need for CPAP.   Stroke [facility])    Thyroid  nodule 12/31/2022    Family History  Problem Relation Age of Onset   Cirrhosis Mother    Diabetes Mother    Cirrhosis Father    Breast cancer Sister 63   Seizures Sister    Breast cancer Sister 8   Migraines Sister    Colon cancer Neg Hx    Past Surgical History:  Procedure Laterality Date   ANKLE SURGERY Left    APPENDECTOMY     BACK SURGERY     BUNIONECTOMY     2 toes   CARDIAC CATHETERIZATION  2008   Normal. Dr Alveta.   CHOLECYSTECTOMY     CYST EXCISION     leg   ESOPHAGOGASTRODUODENOSCOPY (EGD) WITH PROPOFOL  N/A 06/27/2018   Procedure: ESOPHAGOGASTRODUODENOSCOPY (EGD) WITH PROPOFOL ;  Surgeon: Therisa Bi, MD;  Location: [facility] ENDOSCOPY;  Service: Gastroenterology;  Laterality: N/A;   FLANK MASS EXCISION     FOOT TENDON SURGERY  june 2014   both feet   KNEE SURGERY     Bilateral.   NOSE SURGERY     SHOULDER SURGERY     bil.   TOTAL ABDOMINAL HYSTERECTOMY     Social History   Social History Narrative   Patient lives at home with her husband Sudie.    Right Handed   Drinks no caffeine   Immunization History  Administered Date(s) Administered   Fluad Quad(high Dose 65+) 08/03/2020, 12/28/2022   Fluad Trivalent(High Dose 65+) 06/27/2023   INFLUENZA, HIGH DOSE SEASONAL PF 08/04/2024   Influenza Split 08/21/2011   Influenza Whole 07/29/2007, 07/29/2008, 11/07/2009   Influenza,inj,Quad PF,6+ Mos 07/31/2013, 10/16/2018    Influenza-Unspecified 08/18/2015   PFIZER(Purple Top)SARS-COV-2 Vaccination 12/24/2019, 01/14/2020   Pfizer(Comirnaty)Fall Seasonal Vaccine 12 years and older 08/04/2024   Pneumococcal Conjugate-13 08/18/2015   Pneumococcal Polysaccharide-23 07/29/2007, 02/15/2011, 12/15/2015     Objective: Vital Signs: There were no  vitals taken for this visit.   Physical Exam   Musculoskeletal Exam: ***  CDAI Exam: CDAI Score: -- Patient Global: --; Provider Global: -- Swollen: --; Tender: -- Joint Exam 09/07/2024   No joint exam has been documented for this visit   There is currently no information documented on the homunculus. Go to the Rheumatology activity and complete the homunculus joint exam.  Investigation: No additional findings.  Imaging: No results found.  Recent Labs: Lab Results  Component Value Date   WBC 5.7 05/14/2024   HGB 10.5 (L) 05/14/2024   PLT 191 05/14/2024   NA 140 05/14/2024   K 4.1 05/14/2024   CL 109 05/14/2024   CO2 23 05/14/2024   GLUCOSE 97 05/14/2024   BUN 11 05/14/2024   CREATININE 0.74 05/14/2024   BILITOT 0.2 05/14/2024   ALKPHOS 135 (H) 07/04/2023   AST 17 05/14/2024   ALT 12 05/14/2024   PROT 7.4 05/14/2024   ALBUMIN 4.0 07/04/2023   CALCIUM  8.5 (L) 05/14/2024   GFRAA 93 07/28/2020   QFTBGOLDPLUS NEGATIVE 05/14/2024    Speciality Comments: No specialty comments available.  Procedures:  No procedures performed Allergies: Tetanus toxoid-containing vaccines, Alendronate, Codeine phosphate, Flexeril  [cyclobenzaprine ], Ibuprofen , and Penicillins   Assessment / Plan:     Visit Diagnoses: No diagnosis found.  ***  Orders: No orders of the defined types were placed in this encounter.  No orders of the defined types were placed in this encounter.    Follow-Up Instructions: No follow-ups on file.   Shamecca Whitebread M Teisha Trowbridge, CMA  Note - This record has been created using Animal nutritionist.  Chart creation errors have been sought, but may  not always  have been located. Such creation errors do not reflect on  the standard of medical care.

## 2024-08-28 ENCOUNTER — Other Ambulatory Visit: Payer: Self-pay | Admitting: Internal Medicine

## 2024-08-28 ENCOUNTER — Other Ambulatory Visit: Payer: Self-pay

## 2024-08-28 ENCOUNTER — Ambulatory Visit: Payer: Self-pay | Admitting: Family Medicine

## 2024-08-28 VITALS — BP 119/79 | HR 65 | Ht 65.0 in | Wt 205.8 lb

## 2024-08-28 DIAGNOSIS — G8929 Other chronic pain: Secondary | ICD-10-CM | POA: Diagnosis not present

## 2024-08-28 DIAGNOSIS — Z79899 Other long term (current) drug therapy: Secondary | ICD-10-CM

## 2024-08-28 DIAGNOSIS — M797 Fibromyalgia: Secondary | ICD-10-CM | POA: Diagnosis not present

## 2024-08-28 DIAGNOSIS — K219 Gastro-esophageal reflux disease without esophagitis: Secondary | ICD-10-CM

## 2024-08-28 DIAGNOSIS — M069 Rheumatoid arthritis, unspecified: Secondary | ICD-10-CM

## 2024-08-28 DIAGNOSIS — R1013 Epigastric pain: Secondary | ICD-10-CM

## 2024-08-28 MED ORDER — HYDROCODONE-ACETAMINOPHEN 5-325 MG PO TABS: ORAL_TABLET | ORAL | 0 refills | Status: DC

## 2024-08-28 MED ORDER — OMEPRAZOLE 40 MG PO CPDR: 40.0000 mg | DELAYED_RELEASE_CAPSULE | Freq: Every day | ORAL | 3 refills | Status: AC

## 2024-08-28 NOTE — Progress Notes (Signed)
 Specialty Pharmacy Refill Coordination Note  [name] is a 73 y.o. female contacted today regarding refills of specialty medication(s) Etanercept  (Enbrel  SureClick)   Patient requested Delivery   Delivery date: 09/01/24   Verified address: [address]  [place]   Medication will be filled on: 08/31/24   This fill date is pending response to refill request from provider. Patient is aware and if they have not received fill by intended date they must follow up with pharmacy.

## 2024-08-28 NOTE — Assessment & Plan Note (Signed)
 Reinitiated pain treatment with Norco 5-325 last month.  Tox assure checked at that time with opioid metabolites, patient denies using OTC meds that may cause false positive.  Low suspicion that patient was taking opioids that were not prescribed to her. PDMP reviewed and appropriate.  Will send refill for same dose of Norco given ongoing abdominal pain and concern that her dose may cause constipation and worsening GI symptoms.  Advised 1 month follow-up.

## 2024-08-28 NOTE — Telephone Encounter (Signed)
 Last Fill: 05/25/2024  Labs: 05/14/2024 Sed rate remains mildly elevated at 34 about the same as last time. Blood count and kidney and liver function tests look fine for continuing current medications. She has very slight anemia with hemoglobin of 10.5 and slightly low calcium  of 8.5 also the same as last time.   TB Gold: 05/14/2024 Neg    Next Visit: 09/07/2024  Last Visit: 05/25/2024  IK:Myzlfjunpi arthritis involving shoulder, unspecified laterality, unspecified whether rheumatoid factor present   Current Dose per office note 05/25/2024: Enbrel  50 mg subcu weekly   Patient to update labs at upcoming appointment on 09/07/2024  Okay to refill Enbrel ?

## 2024-08-28 NOTE — Progress Notes (Signed)
    SUBJECTIVE:   CHIEF COMPLAINT / HPI:   Chronic pain check in Patient has RA, C-spine radiculopathy, fibromyalgia and polyneuropathy.  I agreed to take over patient's chronic pain management last month due to issues she was having with her pain clinic.  Was taking Norco 10-325 for many years but reported she had been off pain medicines for several months.  Prescribed Norco 5-325 and advised 1 month follow-up to titrate as needed. Tox assure showed opioid metabolites- taking Dextromethorphan, doxylamine, diphenhydramine  prior to tox assure Feels current dose is still not effective, still been struggling with day to day tasks. Sister agrees that pt is not doing as well still.  Abdominal pain Occurs after eating or just randomly Started around 1 month ago Primarily epigastric Does not feel like reflux Feels full more quickly recently 2 episodes of vomiting after abd pain started recently  No dysuria or constipation Soft BM daily Takes dramamine for nausea  PERTINENT  PMH / PSH: reviewed  OBJECTIVE:   BP 119/79   Pulse 65   Ht 5' 5 (1.651 m)   Wt 205 lb 12.8 oz (93.4 kg)   SpO2 100%   BMI 34.25 kg/m   General: Awake and conversant, no acute distress Pulm: normal work of breathing on room air Abd: normoactive BS, soft, diffusely tender to light palpation, voluntary guarding, no rebound Neuro: No focal deficits Psych: Appropriate mood and affect   ASSESSMENT/PLAN:   Assessment & Plan Gastroesophageal reflux disease, unspecified whether esophagitis present Epigastric pain Patient reporting increase in abdominal pain over the last 2 weeks.  Exam significant for marked tenderness to palpation, though abdomen is soft and nonperitonitic.  History of perforated gastric ulcer in 2015.  Will send a referral to GI for evaluation.  Will also increase PPI to higher dose and check CBC to ensure no anemia.  - Ambulatory referral to Gastroenterology - CBC - omeprazole  (PRILOSEC) 40 MG  capsule; Take 1 capsule (40 mg total) by mouth daily.  Dispense: 30 capsule; Refill: 3 Fibromyalgia Other chronic pain Rheumatoid arthritis involving shoulder, unspecified laterality, unspecified whether rheumatoid factor present (HCC) Reinitiated pain treatment with Norco 5-325 last month.  Tox assure checked at that time with opioid metabolites, patient denies using OTC meds that may cause false positive.  Low suspicion that patient was taking opioids that were not prescribed to her. PDMP reviewed and appropriate.  Will send refill for same dose of Norco given ongoing abdominal pain and concern that her dose may cause constipation and worsening GI symptoms.  Advised 1 month follow-up.     Rea Raring, MD [facility]

## 2024-08-28 NOTE — Patient Instructions (Addendum)
 Thank you for coming in today! Here is a summary of what we discussed:  -I sent a referral to the GI doctors for your abdominal pain. Please let us  know if you have not heard from them in the next few weeks  -Let's increase your reflux medicine to 40mg  daily  -We will continue the same dose of pain medicine for now as I don't want this to contribute to your abdominal pain/cause constipation  We are checking some labs today. If they are abnormal, I will call you. If they are normal, I will send you a MyChart message (if it is active) or a letter in the mail. If you do not hear about your labs in the next 2 weeks, please call the office.   Please call the clinic at [phone] if your symptoms worsen or you have any concerns.  Please follow up in 1 month and we can check in  Best, Dr Adele

## 2024-08-29 LAB — CBC
Hematocrit: 31.9 % — ABNORMAL LOW (ref 34.0–46.6)
Hemoglobin: 9.8 g/dL — ABNORMAL LOW (ref 11.1–15.9)
MCH: 26.8 pg (ref 26.6–33.0)
MCHC: 30.7 g/dL — ABNORMAL LOW (ref 31.5–35.7)
MCV: 87 fL (ref 79–97)
Platelets: 190 x10E3/uL (ref 150–450)
RBC: 3.65 x10E6/uL — ABNORMAL LOW (ref 3.77–5.28)
RDW: 16.8 % — ABNORMAL HIGH (ref 11.7–15.4)
WBC: 7.5 x10E3/uL (ref 3.4–10.8)

## 2024-08-31 ENCOUNTER — Ambulatory Visit: Payer: Self-pay | Admitting: Family Medicine

## 2024-08-31 ENCOUNTER — Other Ambulatory Visit: Payer: Self-pay

## 2024-08-31 ENCOUNTER — Other Ambulatory Visit: Payer: Self-pay | Admitting: Family Medicine

## 2024-08-31 DIAGNOSIS — E785 Hyperlipidemia, unspecified: Secondary | ICD-10-CM

## 2024-08-31 MED ORDER — ENBREL SURECLICK 50 MG/ML ~~LOC~~ SOAJ
50.0000 mg | SUBCUTANEOUS | 0 refills | Status: DC
  Filled 2024-08-31: qty 4, 28d supply, fill #0

## 2024-08-31 NOTE — Telephone Encounter (Signed)
 Called patient to discuss results of recent CBC. Her Hgb is low at 9.8, baseline over the last few months has hovered around 10, so this does not represent a drastic change. Patient denies hematochezia, melena, coffee ground stools or emesis. She does endorse some shortness of breath, but it has only been present for a couple of days and is accompanied by cough and chills, so likely unrelated.   Advised patient to watch out for a call from the GI doctor and to reach out to our office if she doesn't hear from them in the next couple of weeks.

## 2024-09-03 ENCOUNTER — Telehealth: Payer: Self-pay

## 2024-09-03 ENCOUNTER — Ambulatory Visit

## 2024-09-03 VITALS — Ht 65.0 in | Wt 205.0 lb

## 2024-09-03 DIAGNOSIS — Z0000 Encounter for general adult medical examination without abnormal findings: Secondary | ICD-10-CM | POA: Diagnosis not present

## 2024-09-03 NOTE — Progress Notes (Signed)
 I connected with  [name] on 09/03/24 by a audio enabled telemedicine application and verified that I am speaking with the correct person using two identifiers.  Patient Location: Home  Provider Location: Home Office  Persons Participating in Visit: Patient.  I discussed the limitations of evaluation and management by telemedicine. The patient expressed understanding and agreed to proceed.   Vital Signs: Because this visit was a virtual/telehealth visit, some criteria may be missing or patient reported. Any vitals not documented were not able to be obtained and vitals that have been documented are patient reported.     Chief Complaint  Patient presents with   Medicare Wellness    SUBSEQUENT     Subjective:   [name] is a 73 y.o. female who presents for a Medicare Annual Wellness Visit.  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   COPD (chronic obstructive pulmonary disease) (HCC)    Depression    Ear pain, bilateral 03/30/2019   Fibromyalgia    08/18/15  [facility]   GERD (gastroesophageal reflux disease)    Headache 05/27/2013   History of CVA (cerebrovascular accident)    11 minni srokes   Hyperlipidemia    Hypertension    Migraine    Neck pain 02/21/2012   Personal history of colonic polyp - adenoma 03/01/2014   Personality disorder (HCC)    Productive cough 11/09/2020   Rash and nonspecific skin eruption 08/04/2020   Rheumatoid  arthritis (HCC)    08/18/15  [facility]   Seizures (HCC)    last seizure 4 years ago   Sleep apnea    Mild. No need for CPAP.   Stroke [facility])    Thyroid  nodule 12/31/2022   Past Surgical History:  Procedure Laterality Date   ANKLE SURGERY Left    APPENDECTOMY     BACK SURGERY     BUNIONECTOMY     2 toes   CARDIAC CATHETERIZATION  2008   Normal. Dr Alveta.   CHOLECYSTECTOMY     CYST EXCISION     leg   ESOPHAGOGASTRODUODENOSCOPY (EGD) WITH PROPOFOL  N/A 06/27/2018   Procedure: ESOPHAGOGASTRODUODENOSCOPY (EGD) WITH PROPOFOL ;  Surgeon: Therisa Bi, MD;  Location: [facility] ENDOSCOPY;  Service: Gastroenterology;  Laterality: N/A;   FLANK MASS EXCISION     FOOT TENDON SURGERY  june 2014   both feet   KNEE SURGERY     Bilateral.   NOSE SURGERY     SHOULDER SURGERY     bil.   TOTAL ABDOMINAL HYSTERECTOMY     Family History  Problem Relation Age of Onset   Cirrhosis Mother    Diabetes Mother    Cirrhosis Father    Breast cancer Sister 46   Seizures Sister    Breast cancer Sister 66   Migraines Sister    Colon cancer Neg Hx    Social History   Occupational History   Occupation: Unemployed  Tobacco Use   Smoking status: Former    Current packs/day: 0.00    Average packs/day: 1 pack/day for 7.0 years (7.0 ttl pk-yrs)    Types: Cigarettes    Start date: 37    Quit date: 63    Years since quitting: 50.9    Passive exposure: Past   Smokeless tobacco: Never   Tobacco comments:    Started smoking age 47 quit in her mid 41s - quit alcohol at the same time.  Vaping Use   Vaping status: Never Used  Substance and Sexual Activity   Alcohol use: No   Drug use: No   Sexual activity: Not Currently   Tobacco Counseling Counseling given: Not Answered Tobacco comments: Started smoking age 39 quit in her mid 2s - quit alcohol at the same time.  SDOH Screenings   Food Insecurity: No Food Insecurity (09/03/2024)  Housing: Low Risk  (09/03/2024)  Transportation Needs: No  Transportation Needs (09/03/2024)  Utilities: Not At Risk (09/03/2024)  Alcohol Screen: Low Risk  (07/09/2023)  Depression (PHQ2-9): Medium Risk (09/03/2024)  Financial Resource Strain: Low Risk  (07/09/2023)  Physical Activity: Inactive (09/03/2024)  Social Connections: Socially Integrated (09/03/2024)  Stress: No Stress Concern Present (09/03/2024)  Tobacco Use: Medium Risk (09/03/2024)  Health Literacy: Adequate Health Literacy (09/03/2024)   See flowsheets for full screening details  Depression Screen Depression Screening Exception Documentation Depression Screening Exception:: Patient refusal  PHQ 2 & 9 Depression Scale- Over the past 2 weeks, how often have you been bothered by any of the following problems? Little interest or pleasure in doing things: 0 Feeling down, depressed, or hopeless (PHQ Adolescent also includes...irritable): 0 PHQ-2 Total Score: 0 Trouble falling or staying asleep, or sleeping too much: 0 Feeling tired or having little energy: 2 Poor appetite or overeating (PHQ Adolescent also includes...weight loss): 3 Feeling bad about yourself - or that you are a failure or have let yourself or your family down: 0 Trouble concentrating on things, such as reading the newspaper or watching television (PHQ Adolescent also includes...like school work): 0 Moving or speaking so slowly that other people could have noticed. Or the opposite - being so fidgety or restless that you have been moving around a lot more than usual: 0 Thoughts that you would be better off dead, or of hurting yourself in some way: 0 PHQ-9 Total Score: 5 If you checked off any problems, how difficult have these problems made it for you to do your work, take care of things at home, or get along with other people?: Not difficult at all     Goals Addressed   None    Visit info / Clinical Intake: Medicare Wellness Visit Type:: Subsequent Annual Wellness Visit Persons participating in visit::  patient Medicare Wellness Visit Mode:: Telephone If telephone:: video declined Because this visit was a virtual/telehealth visit:: pt reported vitals If Telephone or Video please confirm:: I connected with the patient using audio enabled telemedicine application and verified that I am speaking with the correct person using two identifiers; I discussed the limitations of evaluation and management by telemedicine; The patient expressed understanding and agreed to proceed Patient Location:: HOME Provider Location:: HOME OFFICE Information given by:: patient Interpreter Needed?: No Pre-visit prep was completed: yes AWV questionnaire completed by patient prior to visit?: no Living arrangements:: lives with spouse/significant other Patient's Overall Health Status Rating: (!) fair Typical amount of pain: (!) a lot Does pain affect daily life?: (!) yes Are you currently prescribed opioids?: no  Dietary Habits and Nutritional Risks How many meals a day?: 2 Eats fruit and vegetables daily?: yes Most meals are obtained by: preparing own meals In the last 2 weeks, have you had any of the following?: none Diabetic:: no  Functional Status Activities of Daily Living (to include ambulation/medication): Independent Ambulation: Independent with device- listed below Home Assistive Devices/Equipment: Eyeglasses; Cane; Other (Comment) (HOVEROUND) Medication Administration: Independent Home Management: Independent Manage your own finances?: yes Primary transportation is: driving Concerns about vision?: (!) yes (DEFER TO PCP; BLURRED VISION) Concerns about hearing?: no  Fall Screening Falls in the past year?: 1 Number of falls in past year: 0 Was there an injury with Fall?: 1 Fall Risk Category Calculator: 2 Patient Fall Risk Level: Moderate Fall Risk  Fall Risk Patient at Risk for Falls Due to: Impaired balance/gait Fall risk Follow up: Falls evaluation completed; Education provided  Home and  Transportation Safety: All rugs have non-skid backing?: yes All stairs or steps have railings?: (!) no Grab bars in the bathtub or shower?: yes Have non-skid surface in bathtub or shower?: (!) no Good home lighting?: yes Regular seat belt use?: yes Hospital stays in the last year:: no  Cognitive Assessment Difficulty concentrating, remembering, or making decisions? : no Will 6CIT or Mini Cog be Completed: no 6CIT or Mini Cog Declined: patient alert, oriented, able to answer questions appropriately and recall recent events  Advance Directives (For Healthcare) Does Patient Have a Medical Advance Directive?: No Would patient like information on creating a medical advance directive?: No - Patient declined  Reviewed/Updated  Reviewed/Updated: Reviewed All (Medical, Surgical, Family, Medications, Allergies, Care Teams, Patient Goals)        Objective:    Today's Vitals   09/03/24 1533  Weight: 205 lb (93 kg)  Height: 5' 5 (1.651 m)  PainSc: 8   PainLoc: Back   Body mass index is 34.11 kg/m.  Current Medications (verified) Outpatient Encounter Medications as of 09/03/2024  Medication Sig   aspirin  EC 81 MG EC tablet Take 1 tablet (81 mg total) by mouth daily.   atorvastatin  (LIPITOR ) 80 MG tablet TAKE ONE TABLET (80 MG TOTAL) BY MOUTH DAILY.   budesonide -formoterol  (SYMBICORT ) 80-4.5 MCG/ACT inhaler INHALE TWO PUFFS INTO THE LUNGS TWO (TWO) TIMES DAILY. AND AS NEEDED FOR SHORTNESS OF BREATH   carvedilol  (COREG ) 3.125 MG tablet TAKE ONE TABLET BY MOUTH TWICE A DAY WITH A MEAL   citalopram  (CELEXA ) 40 MG tablet TAKE ONE TABLET (40 MG TOTAL) BY MOUTH DAILY.   clotrimazole  (LOTRIMIN ) 1 % cream Apply 1 Application topically 2 (two) times daily. Under breasts   etanercept  (ENBREL  SURECLICK) 50 MG/ML injection Inject 50 mg into the skin once a week.   folic acid  (FOLVITE ) 1 MG tablet TAKE ONE TABLET (1 MG TOTAL) BY MOUTH DAILY.   gabapentin  (NEURONTIN ) 300 MG capsule TAKE ONE  CAPSULE (300 MG TOTAL) BY MOUTH TWO TIMES DAILY.   HYDROcodone -acetaminophen  (NORCO/VICODIN) 5-325 MG tablet Take 1 tablet in the morning and 1-2 tablets at night as needed.   methotrexate  (RHEUMATREX) 2.5 MG tablet TAKE FOUR TABLETS (10 MG TOTAL) BY MOUTH ONCE A WEEK. CAUTION:CHEMOTHERAPY. PROTECT FROM LIGHT.   Multiple Vitamins-Minerals (MULTIVITAMIN WITH MINERALS) tablet Take 1 tablet by mouth daily. (Patient not taking: Reported on 05/25/2024)   naloxone (NARCAN) nasal spray 4 mg/0.1 mL    nystatin  (MYCOSTATIN /NYSTOP ) powder Apply 1 Application topically.   omeprazole  (PRILOSEC) 40 MG capsule Take 1 capsule (40 mg total) by mouth daily.   PHENObarbital  (LUMINAL)  100 MG tablet TAKE ONE TABLET (100 MG TOTAL) BY MOUTH DAILY.   predniSONE  (DELTASONE ) 5 MG tablet TAKE ONE TABLET (5 MG TOTAL) BY MOUTH DAILY WITH BREAKFAST.   topiramate  (TOPAMAX ) 50 MG tablet Take 2 tablets (100 mg total) by mouth daily. Keep appointment for further refills   triamcinolone  cream (KENALOG ) 0.5 % Apply topically.   VENTOLIN  HFA 108 (90 Base) MCG/ACT inhaler INHALE TWO PUFFS INTO THE LUNGS EVERY SIX HOURS AS NEEDED FOR WHEEZING OR SHORTNESS OF BREATH.   No facility-administered encounter medications on file as of 09/03/2024.   Hearing/Vision screen Hearing Screening - Comments:: Denies hearing difficulties.  Vision Screening - Comments:: Wears rx glasses -  not up to date with routine eye exams.  Immunizations and Health Maintenance Health Maintenance  Topic Date Due   Zoster Vaccines- Shingrix (1 of 2) Never done   DEXA SCAN  Never done   Colonoscopy  02/25/2019   COVID-19 Vaccine (4 - Pfizer risk 2025-26 season) 02/02/2025   Medicare Annual Wellness (AWV)  09/03/2025   Pneumococcal Vaccine: 50+ Years  Completed   Influenza Vaccine  Completed   Hepatitis C Screening  Completed   Meningococcal B Vaccine  Aged Out   DTaP/Tdap/Td  Discontinued   Mammogram  Discontinued        Assessment/Plan:  This is a  routine wellness examination for Smt..  Patient Care Team: Adele Song, MD as PCP - General Ang, Marinda BROCKS, MD as Referring Physician (Internal Medicine) Ang, Marinda BROCKS, MD as Referring Physician (Internal Medicine) [facility] as Consulting Physician (Ophthalmology)  I have personally reviewed and noted the following in the patient's chart:   Medical and social history Use of alcohol, tobacco or illicit drugs  Current medications and supplements including opioid prescriptions. Functional ability and status Nutritional status Physical activity Advanced directives List of other physicians Hospitalizations, surgeries, and ER visits in previous 12 months Vitals Screenings to include cognitive, depression, and falls Referrals and appointments  No orders of the defined types were placed in this encounter.  In addition, I have reviewed and discussed with patient certain preventive protocols, quality metrics, and best practice recommendations. A written personalized care plan for preventive services as well as general preventive health recommendations were provided to patient.   Paula LOISE Fuller, LPN   [id number]/[id number]   Return in about 1 year (around 09/03/2025) for Medicare wellness.  After Visit Summary: (Declined) Due to this being a telephonic visit, with patients personalized plan was offered to patient but patient Declined AVS at this time   Nurse Notes: None at this time.

## 2024-09-03 NOTE — Patient Instructions (Signed)
 [name],  Thank you for taking the time for your Medicare Wellness Visit. I appreciate your continued commitment to your health goals. Please review the care plan we discussed, and feel free to reach out if I can assist you further.  Please note that Annual Wellness Visits do not include a physical exam. Some assessments may be limited, especially if the visit was conducted virtually. If needed, we may recommend an in-person follow-up with your provider.  Ongoing Care Seeing your primary care provider every 3 to 6 months helps us  monitor your health and provide consistent, personalized care.   Referrals If a referral was made during today's visit and you haven't received any updates within two weeks, please contact the referred provider directly to check on the status.  Recommended Screenings:  Health Maintenance  Topic Date Due   Zoster (Shingles) Vaccine (1 of 2) Never done   DEXA scan (bone density measurement)  Never done   Colon Cancer Screening  02/25/2019   Medicare Annual Wellness Visit  07/08/2024   COVID-19 Vaccine (4 - Pfizer risk 2025-26 season) 02/02/2025   Pneumococcal Vaccine for age over 56  Completed   Flu Shot  Completed   Hepatitis C Screening  Completed   Meningitis B Vaccine  Aged Out   DTaP/Tdap/Td vaccine  Discontinued   Breast Cancer Screening  Discontinued       09/03/2024    3:36 PM  Advanced Directives  Does Patient Have a Medical Advance Directive? No  Would patient like information on creating a medical advance directive? No - Patient declined    Vision: Annual vision screenings are recommended for early detection of glaucoma, cataracts, and diabetic retinopathy. These exams can also reveal signs of chronic conditions such as diabetes and high blood pressure.  Dental: Annual dental screenings help detect early signs of oral cancer, gum disease, and other conditions linked to overall health, including heart disease and diabetes.  Please see the  attached documents for additional preventive care recommendations.

## 2024-09-03 NOTE — Telephone Encounter (Signed)
 Patient would like to be referred for eye exam to [facility] and [facility] to evaluate the stiffness and fungus on her toes.    Amaar Oshita N. Tomie, LPN [facility] Annual Wellness Team Direct Dial: [phone]

## 2024-09-04 NOTE — Addendum Note (Signed)
 Addended by: Narely Nobles on: 09/04/2024 03:17 PM   Modules accepted: Orders

## 2024-09-07 ENCOUNTER — Other Ambulatory Visit: Payer: Self-pay | Admitting: Family Medicine

## 2024-09-07 ENCOUNTER — Ambulatory Visit: Admitting: Internal Medicine

## 2024-09-07 DIAGNOSIS — I10 Essential (primary) hypertension: Secondary | ICD-10-CM

## 2024-09-07 DIAGNOSIS — Z79899 Other long term (current) drug therapy: Secondary | ICD-10-CM

## 2024-09-07 DIAGNOSIS — M069 Rheumatoid arthritis, unspecified: Secondary | ICD-10-CM

## 2024-09-07 DIAGNOSIS — Z7952 Long term (current) use of systemic steroids: Secondary | ICD-10-CM

## 2024-09-21 ENCOUNTER — Other Ambulatory Visit: Payer: Self-pay

## 2024-09-21 ENCOUNTER — Other Ambulatory Visit: Payer: Self-pay | Admitting: Internal Medicine

## 2024-09-21 DIAGNOSIS — Z79899 Other long term (current) drug therapy: Secondary | ICD-10-CM

## 2024-09-21 DIAGNOSIS — M069 Rheumatoid arthritis, unspecified: Secondary | ICD-10-CM

## 2024-09-25 NOTE — Progress Notes (Signed)
 "  Office Visit Note  Patient: [name]             Date of Birth: December 17, 1950           MRN: [id number]             PCP: Adele Song, MD Referring: Adele Song, MD Visit Date: 09/29/2024   Subjective:  Medication Management (Patient not taking methotrexate  due to hair loss. )   Discussed the use of AI scribe software for clinical note transcription with the patient, who gave verbal consent to proceed.  History of Present Illness   [name] is a 73 y.o. female here for follow up for seropositive erosive rheumatoid arthritis on Enbrel  50 mg [place] weekly and prednisone  5 mg daily.   She has been experiencing worsening leg pain and swelling for the past month, primarily affecting her legs. The pain is not localized to her knees but involves the entire legs, accompanied by a sensation of tightness and muscle weakness. This has resulted in difficulty lifting her legs and getting into vehicles without assistance.  She also takes gabapentin  three times in the morning and three times at night for fibromyalgia, but finds it ineffective. Previous knee injections did not provide significant relief.  No recent illnesses or medical changes. She is currently taking Norco (hydrocodone ) 5 mg for pain management.       Previous HPI 05/25/2024 [name] is a 73 y.o. female here for follow up for seropositive erosive rheumatoid arthritis on Enbrel  50 mg [place] weekly and prednisone  5 mg daily.     She experiences persistent joint pain and swelling, particularly in her hands, despite resuming Enbrel  treatment last Monday after a brief lapse of less than a week. The swelling has not improved and remains unchanged. She experiences morning stiffness and numbness in her pinky finger, which she describes as 'going numb more than anything.'   She uses a cane due to balance issues and reports pain in her left wrist. She experiences pain in her hand daily, especially when performing activities. She is  currently taking gabapentin  and Norco for pain management, although she does not notice significant relief from gabapentin , which she has been on 'since forever.'   She mentions a recent cold about three weeks ago, characterized by a cough, but she did not require antibiotics and did not feel it was severe. No palpitations are reported.   Her current medications include Enbrel , gabapentin , Norco, and citalopram  (Celexa ). She is also taking folic acid .     She had recent lab work 2 weeks ago with kidney liver function test in the normal range and stable mild anemia.  Sed rate of 34 was also stable.     Previous HPI 02/17/2024 [name] is a 73 y.o. female here for follow up for seropositive erosive rheumatoid arthritis on Enbrel  50 mg [place] weekly and prednisone  5 mg daily.    She has been experiencing significant increased leg pain for the past one to two months, describing it as severe enough to prevent walking and getting out of bed. The pain is constant and associated with increased swelling in the legs. She has attempted to manage the pain with stretching, but finds it difficult due to its severity.   Her history of rheumatoid arthritis was previously managed with Enbrel  and prednisone , which were effective in controlling her symptoms. However, she is no longer receiving Enbrel  injections and is unsure of the reason for discontinuation. The absence of Enbrel  has  led to worsening joint symptoms, including pain in her hands and legs.   She reports difficulty with range of motion, particularly in her wrists, and experiences pain when attempting to make a fist. She describes the pain in her wrists as affecting the 'sinus bone'.   Her current medication regimen includes a small dose of prednisone , which provides temporary relief for her symptoms. She wants to resume her previous medication regimen to improve her mobility and reduce pain.     Previous HPI 11/19/2023 [name] is a 73  y.o. female here for follow up for seropositive erosive rheumatoid arthritis on Enbrel  50 mg [place] weekly.  She patient presents with hand pain and stiffness.   She experiences ongoing pain and stiffness in her hands, primarily across the palms and knuckles, with difficulty straightening her fingers. The symptoms have been persistent, fluctuating weekly, and today is similar to her typical day over the past month, with significant discomfort when attempting to flatten her hand. No recent upper respiratory infections or swelling in her legs or knees.   She has not taken any steroids since her last visit and is not using any over-the-counter medications for her symptoms. She is currently on Enbrel , administered as a weekly shot.   She has not previously seen an occupational therapist for her hand issues. She is concerned about the potential for her hand to become claw-like due to the persistent stiffness and pain.      Previous HPI 08/19/2023 [name] is a 73 y.o. female here for follow up for seropositive erosive rheumatoid arthritis.  We started on leflunomide  20 mg daily after last visit but she had to stop this due to pretty severe GI intolerance.  So far on no RA treatment she is having a lot of joint problems with pain and swelling in the left hand and wrist and decreased grip function on both hands.  Staying stiff for several hours daily.   Previous HPI 06/18/2023 [name] is a 73 y.o. female here for follow up for seropositive erosive rheumatoid arthritis.  Lab testing initial visit showed mildly elevated sedimentation rate 34 however this is better than previous labs reviewed preceding months.  Changes consistent with longstanding erosive disease extensive throughout MTP joints.  Chest x-ray was normal.  She did not feel a very great relief in symptoms with the course of oral prednisone .  Still has pain and stiffness and swelling of multiple joints but especially tenderness with any  firm pressure or with use.   Previous HPI 05/28/23 [name] is a 73 y.o. female here for evaluation management of seropositive erosive rheumatoid arthritis.  Original diagnosis in 2016 with joint pain and swelling in multiple areas including both elbows hands and right knee.  Initial treatments include methotrexate  and prednisone .  Subsequently tried increase in methotrexate  dose and switching to subcutaneous route with only partial disease response.  She was started on Humira in January 2019 with some disease response subsequently increased to weekly dosing in August 2019.  This was then disrupted and not following regularly starting due to coronavirus.  Off RA medication her worst problem is pain and swelling in the left hand.  She is also noticing some progressive worsening of changes at the MCP joints and lateral deviation in her fingers.  However she is having pretty much widespread pain throughout upper and lower extremities.  She takes gabapentin  300 mg twice daily and has been on chronic low-dose hydrocodone  and back injections treatments used for chronic pain.  Labs reviewed 10/2017 HBV neg HCV neg   Imaging reviewed  10/2018 DEXA with osteopenia - FRAX 19%, 3.6%   Review of Systems  Constitutional:  Positive for fatigue.  HENT:  Negative for mouth sores and mouth dryness.   Eyes:  Negative for dryness.  Respiratory:  Positive for shortness of breath.   Cardiovascular:  Negative for chest pain and palpitations.  Gastrointestinal:  Negative for blood in stool, constipation and diarrhea.  Endocrine: Negative for increased urination.  Genitourinary:  Negative for involuntary urination.  Musculoskeletal:  Positive for joint pain, gait problem, joint pain, joint swelling, myalgias, muscle weakness, morning stiffness, muscle tenderness and myalgias.  Skin:  Positive for hair loss. Negative for color change, rash and sensitivity to sunlight.  Allergic/Immunologic: Positive for  susceptible to infections.  Neurological:  Positive for dizziness and headaches.  Hematological:  Negative for swollen glands.  Psychiatric/Behavioral:  Positive for depressed mood and sleep disturbance. The patient is nervous/anxious.     PMFS History:  Patient Active Problem List   Diagnosis Date Noted   High risk medication use 05/28/2023   Peripheral neuropathy 01/03/2021   CVA (cerebral vascular accident) (HCC) 09/09/2019   Osteopenia of neck of femur 02/05/2019   Coronary artery disease 10/16/2018   Hypersomnia with sleep apnea 04/18/2017   Post traumatic stress disorder (PTSD) 04/18/2017   COPD mixed type (HCC) 04/18/2017   Fibromyalgia 08/18/2015   Rheumatoid arthritis (HCC) 03/09/2015   Essential hypertension, benign 02/18/2015   Cervical disc disorder with radiculopathy of cervical region 01/21/2015   History of colonic polyps 03/01/2014   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   Chronic migraine without aura, with intractable migraine, so stated, with status migrainosus 07/29/2007   HYPERLIPIDEMIA 12/19/2006   OBESITY, NOS 12/19/2006   Depression with anxiety 12/19/2006   PANIC ATTACKS 12/19/2006   REFLUX ESOPHAGITIS 12/19/2006    Past Medical History:  Diagnosis Date   After cataract 07/22/2014   Asthma    Bilateral arm pain 01/13/2015   BORDERLINE PERSONALITY 12/19/2006   Qualifier: Diagnosis of   By: Damien Folks         Chronic low back pain    Collagen vascular disease    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   COPD (chronic obstructive pulmonary disease) (HCC)    Depression    Ear pain, bilateral 03/30/2019   Fibromyalgia    08/18/15  [facility]   GERD (gastroesophageal reflux disease)    Headache 05/27/2013   History of CVA (cerebrovascular accident)    11 minni srokes   Hyperlipidemia    Hypertension    Migraine     Neck pain 02/21/2012   Personal history of colonic polyp - adenoma 03/01/2014   Personality disorder (HCC)    Productive cough 11/09/2020   Rash and nonspecific skin eruption 08/04/2020   Rheumatoid arthritis (HCC)    08/18/15  [facility]   Seizures (HCC)    last seizure 4 years ago   Sleep apnea    Mild. No need for CPAP.   Stroke [facility])    Thyroid  nodule 12/31/2022    Family History  Problem Relation Age of Onset   Cirrhosis Mother    Diabetes Mother    Cirrhosis Father    Breast cancer Sister 50   Seizures Sister    Breast cancer Sister 78  Migraines Sister    Colon cancer Neg Hx    Past Surgical History:  Procedure Laterality Date   ANKLE SURGERY Left    APPENDECTOMY     BACK SURGERY     BUNIONECTOMY     2 toes   CARDIAC CATHETERIZATION  2008   Normal. Dr Alveta.   CHOLECYSTECTOMY     CYST EXCISION     leg   ESOPHAGOGASTRODUODENOSCOPY (EGD) WITH PROPOFOL  N/A 06/27/2018   Procedure: ESOPHAGOGASTRODUODENOSCOPY (EGD) WITH PROPOFOL ;  Surgeon: Therisa Bi, MD;  Location: [facility] ENDOSCOPY;  Service: Gastroenterology;  Laterality: N/A;   FLANK MASS EXCISION     FOOT TENDON SURGERY  june 2014   both feet   KNEE SURGERY     Bilateral.   NOSE SURGERY     SHOULDER SURGERY     bil.   TOTAL ABDOMINAL HYSTERECTOMY     Social History   Social History Narrative   Patient lives at home with her husband Sudie.    Right Handed   Drinks no caffeine   Immunization History  Administered Date(s) Administered   Fluad Quad(high Dose 65+) 08/03/2020, 12/28/2022   Fluad Trivalent(High Dose 65+) 06/27/2023   INFLUENZA, HIGH DOSE SEASONAL PF 08/04/2024   Influenza Split 08/21/2011   Influenza Whole 07/29/2007, 07/29/2008, 11/07/2009   Influenza,inj,Quad PF,6+ Mos 07/31/2013, 10/16/2018   Influenza-Unspecified 08/18/2015   PFIZER(Purple Top)SARS-COV-2 Vaccination 12/24/2019, 01/14/2020   Pfizer(Comirnaty)Fall Seasonal Vaccine 12 years and older 08/04/2024   Pneumococcal Conjugate-13  08/18/2015   Pneumococcal Polysaccharide-23 07/29/2007, 02/15/2011, 12/15/2015     Objective: Vital Signs: BP (!) 141/86   Pulse 63   Temp (!) 96 F (35.6 C)   Resp 16   Ht 5' 5 (1.651 m)   Wt 203 lb 12.8 oz (92.4 kg)   BMI 33.91 kg/m    Physical Exam Eyes:     Conjunctiva/sclera: Conjunctivae normal.  Cardiovascular:     Rate and Rhythm: Normal rate and regular rhythm.  Pulmonary:     Effort: Pulmonary effort is normal.     Breath sounds: Normal breath sounds.  Skin:    General: Skin is warm and dry.  Neurological:     Mental Status: She is alert.  Psychiatric:        Mood and Affect: Mood normal.      Musculoskeletal Exam:  Shoulders decreased overhead range of motion, no focal tenderness to pressure Elbows Limited extension range of motion bilaterally, no focal tenderness or palpable swelling Fingers full ROM, MCP joint tenderness to pressure, reducible lateral deviation, left fifth PIP joint swelling No paraspinal tenderness to palpation over upper and lower back Hip normal internal and external rotation without pain, no tenderness to lateral hip palpation Knees full ROM no tenderness or swelling, mild right knee effusion and tenderness to pressure and movement Ankles full ROM no tenderness or swelling  Investigation: No additional findings.  Imaging: No results found.  Recent Labs: Lab Results  Component Value Date   WBC 8.8 09/29/2024   HGB 11.0 (L) 09/29/2024   PLT 230 09/29/2024   NA 137 09/29/2024   K 4.2 09/29/2024   CL 107 09/29/2024   CO2 21 09/29/2024   GLUCOSE 87 09/29/2024   BUN 13 09/29/2024   CREATININE 0.73 09/29/2024   BILITOT 0.2 09/29/2024   ALKPHOS 135 (H) 07/04/2023   AST 24 09/29/2024   ALT 18 09/29/2024   PROT 7.4 09/29/2024   ALBUMIN 4.0 07/04/2023   CALCIUM  8.3 (L) 09/29/2024   GFRAA 93  07/28/2020   QFTBGOLDPLUS NEGATIVE 05/14/2024    Speciality Comments: No specialty comments available.  Procedures:  Large Joint  Inj: R knee on 09/29/2024 4:10 PM Indications: pain Details: 27 G 1.5 in needle, anteromedial approach Medications: 2 mL lidocaine  1 %; 40 mg triamcinolone  acetonide 40 MG/ML Outcome: tolerated well, no immediate complications Procedure, treatment alternatives, risks and benefits explained, specific risks discussed. Consent was given by the patient. Immediately prior to procedure a time out was called to verify the correct patient, procedure, equipment, support staff and site/side marked as required. Patient was prepped and draped in the usual sterile fashion.     Allergies: Tetanus toxoid-containing vaccines, Alendronate, Codeine phosphate, Flexeril  [cyclobenzaprine ], Ibuprofen , and Penicillins   Assessment / Plan:     Visit Diagnoses: Rheumatoid arthritis involving shoulder, unspecified laterality, unspecified whether rheumatoid factor present (HCC) - Plan: Sedimentation rate, Large Joint Inj: R knee Remains in low to moderate disease activity with Enbrel  despite low-dose prednisone .Some increase in knee pain and finger pain but seems more related to her OA vs widespread RA flare. - checking sed rate - Continue Enbrel  50 mg subcu weekly - Continue prednisone  5 mg daily - consider resuming methotrexate  if labs indicating worse inflammation  High risk medication use - Enbrel  50 mg subcu weekly - Plan: CBC with Differential/Platelet, Comprehensive metabolic panel with GFR No serious interval infections. - Checking CBC and CMP for medication monitoring on Enbrel   Other polyneuropathy - Plan: pregabalin  (LYRICA ) 50 MG capsule Fibromyalgia - Plan: pregabalin  (LYRICA ) 50 MG capsule Chronic pain inadequately managed with gabapentin . Lyrica  considered for better long-term pain control. - Prescribed Lyrica  50 mg once daily for one week, then increase to twice daily if tolerated. - Gradually taper off gabapentin  by reducing one dose at a time, two weeks apart, while increasing  Lyrica .  Osteoarthritis of knee Chronic knee pain with bone-on-bone contact. Previous injections ineffective. Not a surgical candidate. Cortisone injection considered low-risk for pain relief. - Administered cortisone injection in the knee.   Orders: Orders Placed This Encounter  Procedures   Large Joint Inj: R knee   Sedimentation rate   CBC with Differential/Platelet   Comprehensive metabolic panel with GFR   Meds ordered this encounter  Medications   pregabalin  (LYRICA ) 50 MG capsule    Sig: Start 1 capsule daily then increase to 2 times daily as directed    Dispense:  60 capsule    Refill:  2     Follow-Up Instructions: Return in about 3 months (around 12/28/2024) for RA/FMS on ENB/GC/inj/LYR f/u 3mos.   Lonni LELON Ester, MD  Note - This record has been created using [employer].  Chart creation errors have been sought, but may not always  have been located. Such creation errors do not reflect on  the standard of medical care. "

## 2024-09-28 ENCOUNTER — Other Ambulatory Visit: Payer: Self-pay | Admitting: Family Medicine

## 2024-09-28 DIAGNOSIS — M797 Fibromyalgia: Secondary | ICD-10-CM

## 2024-09-28 DIAGNOSIS — G8929 Other chronic pain: Secondary | ICD-10-CM

## 2024-09-29 ENCOUNTER — Other Ambulatory Visit: Payer: Self-pay

## 2024-09-29 ENCOUNTER — Ambulatory Visit: Attending: Internal Medicine | Admitting: Internal Medicine

## 2024-09-29 ENCOUNTER — Encounter: Payer: Self-pay | Admitting: Internal Medicine

## 2024-09-29 ENCOUNTER — Ambulatory Visit: Admitting: Podiatry

## 2024-09-29 VITALS — BP 141/86 | HR 63 | Temp 96.0°F | Resp 16 | Ht 65.0 in | Wt 203.8 lb

## 2024-09-29 DIAGNOSIS — G6289 Other specified polyneuropathies: Secondary | ICD-10-CM

## 2024-09-29 DIAGNOSIS — B351 Tinea unguium: Secondary | ICD-10-CM | POA: Diagnosis not present

## 2024-09-29 DIAGNOSIS — Z7952 Long term (current) use of systemic steroids: Secondary | ICD-10-CM

## 2024-09-29 DIAGNOSIS — Z79899 Other long term (current) drug therapy: Secondary | ICD-10-CM | POA: Diagnosis not present

## 2024-09-29 DIAGNOSIS — M069 Rheumatoid arthritis, unspecified: Secondary | ICD-10-CM

## 2024-09-29 DIAGNOSIS — M25561 Pain in right knee: Secondary | ICD-10-CM

## 2024-09-29 DIAGNOSIS — M797 Fibromyalgia: Secondary | ICD-10-CM

## 2024-09-29 DIAGNOSIS — M79675 Pain in left toe(s): Secondary | ICD-10-CM | POA: Diagnosis not present

## 2024-09-29 DIAGNOSIS — M79674 Pain in right toe(s): Secondary | ICD-10-CM | POA: Diagnosis not present

## 2024-09-29 MED ORDER — PREGABALIN 50 MG PO CAPS: ORAL_CAPSULE | ORAL | 2 refills | Status: AC

## 2024-09-29 NOTE — Progress Notes (Signed)
 Chief Complaint  Patient presents with   Nail Problem    NP- toe fungus bilateral x years. She states her great toe left foot is stiff. She had bilateral foot surgeries years ago but she is a poor historian when it comes to dates and procedures.     SUBJECTIVE Patient presents to office today complaining of elongated, thickened nails that cause pain while ambulating in shoes.  Patient is unable to trim their own nails. Patient is here for further evaluation and treatment.  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   COPD (chronic obstructive pulmonary disease) (HCC)    Depression    Ear pain, bilateral 03/30/2019   Fibromyalgia    08/18/15  [facility]   GERD (gastroesophageal reflux disease)    Headache 05/27/2013   History of CVA (cerebrovascular accident)    11 minni srokes   Hyperlipidemia    Hypertension    Migraine    Neck pain 02/21/2012   Personal history of colonic polyp - adenoma 03/01/2014   Personality disorder (HCC)    Productive cough 11/09/2020   Rash and nonspecific skin eruption 08/04/2020   Rheumatoid arthritis (HCC)    08/18/15  [facility]   Seizures (HCC)    last seizure 4 years ago   Sleep apnea    Mild. No need for CPAP.   Stroke [facility])    Thyroid  nodule 12/31/2022    Allergies  Allergen Reactions   Tetanus Toxoid-Containing Vaccines Swelling    Required office visit for management   Alendronate Diarrhea and Nausea And Vomiting   Codeine Phosphate Nausea And Vomiting   Flexeril  [Cyclobenzaprine ] Nausea And Vomiting and Other (See Comments)    Dizziness    Ibuprofen  Hives and Nausea  And Vomiting   Penicillins Hives and Rash    Reported with amoxicillin     OBJECTIVE General Patient is awake, alert, and oriented x 3 and in no acute distress. Derm Skin is dry and supple bilateral. Negative open lesions or macerations. Remaining integument unremarkable. Nails are tender, long, thickened and dystrophic with subungual debris, consistent with onychomycosis, 1-5 bilateral. No signs of infection noted. Vasc  DP and PT pedal pulses palpable bilaterally. Temperature gradient within normal limits.  Neuro Epicritic and protective threshold sensation grossly intact bilaterally.  Musculoskeletal Exam No symptomatic pedal deformities noted bilateral. Muscular strength within normal limits.  ASSESSMENT 1.  Pain due to onychomycosis of toenails both  PLAN OF CARE 1. Patient evaluated today.  2. Instructed to maintain good pedal hygiene and foot care.  3. Mechanical debridement of nails 1-5 bilaterally performed using a nail nipper. Filed with dremel without incident.  4. Return to clinic in 3 mos.    Thresa EMERSON Sar, DPM [facility]  Dr. Thresa EMERSON Sar, DPM    [address]  [place], [place] [address]                Office [phone]  Fax [phone]

## 2024-09-29 NOTE — Patient Instructions (Signed)
 I recommend to try switching over from the gabapentin  to Lyrica  and see if this is any better for your chronic pain.  I recommend to start with 50 mg daily in addition to current medication. If tolerating, then decrease the gabapentin  to once daily and then increase lyrica  to twice daily. After that can stop gabapentin  and just continue with lyrica  2 times daily.  If this is not as effective we can then adjust the dose as needed.  If you notice any major side effect you could stop and resume the previous gabapentin  dose.

## 2024-09-30 ENCOUNTER — Ambulatory Visit: Payer: Self-pay | Admitting: Internal Medicine

## 2024-09-30 ENCOUNTER — Other Ambulatory Visit: Payer: Self-pay | Admitting: Internal Medicine

## 2024-09-30 ENCOUNTER — Other Ambulatory Visit: Payer: Self-pay

## 2024-09-30 ENCOUNTER — Other Ambulatory Visit (HOSPITAL_COMMUNITY): Payer: Self-pay

## 2024-09-30 DIAGNOSIS — Z79899 Other long term (current) drug therapy: Secondary | ICD-10-CM

## 2024-09-30 DIAGNOSIS — M069 Rheumatoid arthritis, unspecified: Secondary | ICD-10-CM

## 2024-09-30 LAB — COMPREHENSIVE METABOLIC PANEL WITH GFR
AG Ratio: 1 (calc) (ref 1.0–2.5)
ALT: 18 U/L (ref 6–29)
AST: 24 U/L (ref 10–35)
Albumin: 3.7 g/dL (ref 3.6–5.1)
Alkaline phosphatase (APISO): 78 U/L (ref 37–153)
BUN: 13 mg/dL (ref 7–25)
CO2: 21 mmol/L (ref 20–32)
Calcium: 8.3 mg/dL — ABNORMAL LOW (ref 8.6–10.4)
Chloride: 107 mmol/L (ref 98–110)
Creat: 0.73 mg/dL (ref 0.60–1.00)
Globulin: 3.7 g/dL (ref 1.9–3.7)
Glucose, Bld: 87 mg/dL (ref 65–99)
Potassium: 4.2 mmol/L (ref 3.5–5.3)
Sodium: 137 mmol/L (ref 135–146)
Total Bilirubin: 0.2 mg/dL (ref 0.2–1.2)
Total Protein: 7.4 g/dL (ref 6.1–8.1)
eGFR: 86 mL/min/1.73m2 (ref >=60)

## 2024-09-30 LAB — CBC WITH DIFFERENTIAL/PLATELET
Absolute Lymphocytes: 3177 {cells}/uL (ref 850–3900)
Absolute Monocytes: 642 {cells}/uL (ref 200–950)
Basophils Absolute: 62 {cells}/uL (ref 0–200)
Basophils Relative: 0.7 %
Eosinophils Absolute: 167 {cells}/uL (ref 15–500)
Eosinophils Relative: 1.9 %
HCT: 35.2 % — ABNORMAL LOW (ref 35.9–46.0)
Hemoglobin: 11 g/dL — ABNORMAL LOW (ref 11.7–15.5)
MCH: 27.4 pg (ref 27.0–33.0)
MCHC: 31.3 g/dL — ABNORMAL LOW (ref 31.6–35.4)
MCV: 87.8 fL (ref 81.4–101.7)
MPV: 11.4 fL (ref 7.5–12.5)
Monocytes Relative: 7.3 %
Neutro Abs: 4752 {cells}/uL (ref 1500–7800)
Neutrophils Relative %: 54 %
Platelets: 230 Thousand/uL (ref 140–400)
RBC: 4.01 Million/uL (ref 3.80–5.10)
RDW: 14.9 % (ref 11.0–15.0)
Total Lymphocyte: 36.1 %
WBC: 8.8 Thousand/uL (ref 3.8–10.8)

## 2024-09-30 LAB — SEDIMENTATION RATE: Sed Rate: 29 mm/h (ref 0–30)

## 2024-09-30 MED ORDER — ENBREL SURECLICK 50 MG/ML ~~LOC~~ SOAJ
50.0000 mg | SUBCUTANEOUS | 0 refills | Status: AC
  Filled 2024-09-30: qty 12, 84d supply, fill #0
  Filled 2024-10-01: qty 4, 28d supply, fill #0
  Filled 2024-10-21 – 2024-10-23 (×2): qty 4, 28d supply, fill #1
  Filled 2024-11-18: qty 4, 28d supply, fill #2

## 2024-09-30 NOTE — Progress Notes (Signed)
 Sed rate remains normal off the methotrexate  no need to resume it. Blood count Hgb 11.0 is improved although still below normal. Metabolic panel is good.

## 2024-09-30 NOTE — Progress Notes (Signed)
 Reviewed and agree with Dr Rennis plan.

## 2024-09-30 NOTE — Telephone Encounter (Signed)
 Last Fill: 08/31/2024 (30 day supply)  Labs: 09/29/2024 Hgb 11.0, Hct 35.2, MCHC 31.3, Calcium  8.3  TB Gold: 05/14/2024 Neg    Next Visit: not on file.  Last Visit: 09/30/2024  IK:Myzlfjunpi arthritis involving shoulder, unspecified laterality, unspecified whether rheumatoid factor present   Current Dose per office note 09/29/2024: Enbrel  50 mg subcu weekly   Okay to refill Enbrel ? When should patient follow up?

## 2024-10-01 ENCOUNTER — Other Ambulatory Visit (HOSPITAL_COMMUNITY): Payer: Self-pay

## 2024-10-01 ENCOUNTER — Other Ambulatory Visit: Payer: Self-pay

## 2024-10-01 ENCOUNTER — Other Ambulatory Visit: Payer: Self-pay | Admitting: Pharmacy Technician

## 2024-10-01 NOTE — Progress Notes (Signed)
 Clinical Intervention Note  Clinical Intervention Notes: Patient reported starting Lyrica , no DDIs were identified with her Enbrel .   Clinical Intervention Outcomes: Prevention of an adverse drug event   Silvano LOISE Blair Karel Santa

## 2024-10-01 NOTE — Progress Notes (Signed)
 Specialty Pharmacy Refill Coordination Note  [name] is a 73 y.o. female contacted today regarding refills of specialty medication(s) Etanercept  (Enbrel  SureClick)   Patient requested Delivery   Delivery date: 10/02/24   Verified address: [address]  [place]   Medication will be filled on: 10/01/24

## 2024-10-21 ENCOUNTER — Other Ambulatory Visit (HOSPITAL_COMMUNITY): Payer: Self-pay

## 2024-10-21 MED ORDER — LIDOCAINE HCL 1 % IJ SOLN
2.0000 mL | INTRAMUSCULAR | Status: AC | PRN
  Administered 2024-09-29: 2 mL

## 2024-10-21 MED ORDER — TRIAMCINOLONE ACETONIDE 40 MG/ML IJ SUSP
40.0000 mg | INTRAMUSCULAR | Status: AC | PRN
  Administered 2024-09-29: 40 mg via INTRA_ARTICULAR

## 2024-10-23 ENCOUNTER — Other Ambulatory Visit (HOSPITAL_COMMUNITY): Payer: Self-pay

## 2024-10-23 NOTE — Progress Notes (Signed)
 Specialty Pharmacy Refill Coordination Note  [name] is a 74 y.o. female contacted today regarding refills of specialty medication(s) Etanercept  (Enbrel  SureClick)   Patient requested Delivery   Delivery date: 10/29/24   Verified address: [address]  [place]   Medication will be filled on: 10/28/24    Send bill

## 2024-10-28 ENCOUNTER — Other Ambulatory Visit: Payer: Self-pay | Admitting: Family Medicine

## 2024-10-28 DIAGNOSIS — M797 Fibromyalgia: Secondary | ICD-10-CM

## 2024-10-28 DIAGNOSIS — G8929 Other chronic pain: Secondary | ICD-10-CM

## 2024-11-02 ENCOUNTER — Other Ambulatory Visit: Payer: Self-pay | Admitting: Internal Medicine

## 2024-11-02 ENCOUNTER — Encounter: Payer: Self-pay | Admitting: Family Medicine

## 2024-11-02 ENCOUNTER — Ambulatory Visit: Payer: Self-pay | Admitting: Family Medicine

## 2024-11-02 VITALS — BP 125/76 | HR 79 | Ht 65.0 in | Wt 204.2 lb

## 2024-11-02 DIAGNOSIS — F4310 Post-traumatic stress disorder, unspecified: Secondary | ICD-10-CM | POA: Diagnosis not present

## 2024-11-02 DIAGNOSIS — M797 Fibromyalgia: Secondary | ICD-10-CM | POA: Diagnosis not present

## 2024-11-02 DIAGNOSIS — R062 Wheezing: Secondary | ICD-10-CM | POA: Diagnosis not present

## 2024-11-02 DIAGNOSIS — F418 Other specified anxiety disorders: Secondary | ICD-10-CM

## 2024-11-02 DIAGNOSIS — J449 Chronic obstructive pulmonary disease, unspecified: Secondary | ICD-10-CM

## 2024-11-02 DIAGNOSIS — G8929 Other chronic pain: Secondary | ICD-10-CM

## 2024-11-02 DIAGNOSIS — M069 Rheumatoid arthritis, unspecified: Secondary | ICD-10-CM

## 2024-11-02 DIAGNOSIS — R4589 Other symptoms and signs involving emotional state: Secondary | ICD-10-CM

## 2024-11-02 DIAGNOSIS — R630 Anorexia: Secondary | ICD-10-CM

## 2024-11-02 MED ORDER — PREDNISONE 10 MG PO TABS: 30.0000 mg | ORAL_TABLET | Freq: Every day | ORAL | 0 refills | Status: AC

## 2024-11-02 MED ORDER — MIRTAZAPINE 7.5 MG PO TABS: 7.5000 mg | ORAL_TABLET | Freq: Every day | ORAL | 0 refills | Status: AC

## 2024-11-02 NOTE — Patient Instructions (Addendum)
 Thank you for coming in today! Here is a summary of what we discussed:  -Take 30mg  steroids daily for 5 days for your wheezing. You can hold your normal 5mg  tablet during this time and start it back once the 5 days is over.  -I am so sorry for your loss- if you are interested, you can reach out to [facility] to set up grief counseling. You can call them at [phone] or email them at [email or web address]  -GI appointment: 11/18/2024 2:00 PM Celestia Rima, NP [facility] GI [facility] at [facility] Address: [address], [place], [place] [address] Phone: [phone]  -Please schedule a follow up with me in the next 2-3 weeks to check in   Please call the clinic at [phone] if your symptoms worsen or you have any concerns.  Best, Dr Adele

## 2024-11-02 NOTE — Telephone Encounter (Signed)
 Last Fill: 08/06/2024  Next Visit: Due 12/28/2024. Message sent to the front to schedule.   Last Visit: 09/29/2024  Dx: Rheumatoid arthritis involving shoulder, unspecified laterality, unspecified whether rheumatoid factor present   Current Dose per office note on 09/29/2024: prednisone  5 mg daily   Okay to refill Prednisone ?

## 2024-11-02 NOTE — Telephone Encounter (Signed)
 Please schedule patient a follow up visit. Patient due 12/28/2024. Thanks!

## 2024-11-02 NOTE — Progress Notes (Unsigned)
" ° ° °  SUBJECTIVE:   CHIEF COMPLAINT / HPI:   GERD/abdominal pain --GI appointment later this month  RA Fibromyalgia Chronic pain --On Percocet 5-325 1 tablet AM and 1-2 tablets PM --saw rheum recently, enbrel  50 mg daily and 5 mg prednisone  daily, not taking methotrexate  -- Lyrica  50 mg daily for fibromyalgia/chronic pain, tapering off gabapentin  (taking 300mg  once daily)  MDD, PTSD --  URI, wheezing --using inhaler 4 times daily   PERTINENT  PMH / PSH: ***  OBJECTIVE:   BP 125/76   Pulse 79   Ht 5' 5 (1.651 m)   Wt 204 lb 3.2 oz (92.6 kg)   SpO2 100%   BMI 33.98 kg/m   ***  ASSESSMENT/PLAN:   Assessment & Plan Poor appetite      Rea Raring, MD [facility] "

## 2024-11-03 NOTE — Assessment & Plan Note (Signed)
 Remains stable on chronic opioid medication. No concerns or changes indicated today.

## 2024-11-03 NOTE — Telephone Encounter (Signed)
 Attempted to contact patient and left message to advise patient to call the office and schedule appointment.

## 2024-11-03 NOTE — Assessment & Plan Note (Addendum)
 Patient with notable wheezing on exam, possibly secondary to viral URI triggering mild COPD exacerbation, though reassured by lack of cardinal symptoms. Normal oxygenation in clinic. Will send 5 days of 30mg  prednisone  burst. Advised patient to hold daily prednisone  5mg  while taking steroid burst and resume after completion of this.  Reviewed return precautions.

## 2024-11-03 NOTE — Assessment & Plan Note (Addendum)
 Patient with exacerbation of underlying MDD due to husband's recent passing.  Given disturbed sleep and poor appetite, will start low-dose Remeron  today as adjuvant to patient Celexa .  No active SI, reassured by patient's good family support from her sisters.  Also provided information about [facility]. Advised close follow-up in 2 to 3 weeks - mirtazapine  (REMERON ) 7.5 MG tablet; Take 1 tablet (7.5 mg total) by mouth at bedtime.  Dispense: 90 tablet; Refill: 0

## 2024-11-09 ENCOUNTER — Other Ambulatory Visit: Payer: Self-pay | Admitting: Internal Medicine

## 2024-11-09 DIAGNOSIS — M069 Rheumatoid arthritis, unspecified: Secondary | ICD-10-CM

## 2024-11-18 ENCOUNTER — Other Ambulatory Visit: Payer: Self-pay | Admitting: Neurology

## 2024-11-18 ENCOUNTER — Other Ambulatory Visit: Payer: Self-pay

## 2024-11-18 ENCOUNTER — Other Ambulatory Visit: Payer: Self-pay | Admitting: Pharmacy Technician

## 2024-11-18 ENCOUNTER — Ambulatory Visit: Admitting: Family Medicine

## 2024-11-18 NOTE — Telephone Encounter (Signed)
 Last seen 02/27/24 Next appt 03/04/25 Last noted stated: ASSESSMENT AND PLAN 74 y.o. year old female    1.  History of seizures, well controlled -Last seizure was in 2013, continue phenobarbital  -In September 2022 phenobarbital  level was 27 -Check phenobarbital  level today  Dispenses   Dispensed Days Supply Quantity Provider Pharmacy  PHENOBARBITAL   100 MG TABS 08/24/2024 90 90 tablet Gayland Lauraine PARAS, NP [facility]  PHENOBARBITAL   100 MG TABS 05/27/2024 90 90 tablet Gayland Lauraine PARAS, NP [facility]  PHENOBARBITAL   100 MG TABS 04/27/2024 30 30 tablet Ines Onetha NOVAK, MD [facility]  PHENOBARBITAL   100 MG TABS 03/30/2024 30 30 tablet Ines Onetha NOVAK, MD [facility]  PHENOBARBITAL   100 MG TABS 03/03/2024 30 30 tablet Ines Onetha NOVAK, MD [facility]  PHENOBARBITAL   100 MG TABS 02/04/2024 30 30 tablet Ines Onetha NOVAK, MD [facility]  PHENOBARBITAL   100 MG TABS 01/07/2024 30 30 tablet Ines Onetha NOVAK, MD [facility]  PHENOBARBITAL   100 MG TABS 12/10/2023 30 30 tablet Ines Onetha NOVAK, MD [facility]

## 2024-11-18 NOTE — Progress Notes (Signed)
 Specialty Pharmacy Refill Coordination Note  [name] is a 74 y.o. female contacted today regarding refills of specialty medication(s) Etanercept  (Enbrel  SureClick)   Patient requested Delivery   Delivery date: 11/25/24   Verified address: [address]  [place]    Medication will be filled on: 11/24/24

## 2024-11-24 ENCOUNTER — Other Ambulatory Visit: Payer: Self-pay

## 2024-11-26 ENCOUNTER — Other Ambulatory Visit: Payer: Self-pay | Admitting: Family Medicine

## 2024-11-26 DIAGNOSIS — M797 Fibromyalgia: Secondary | ICD-10-CM

## 2024-11-26 DIAGNOSIS — G8929 Other chronic pain: Secondary | ICD-10-CM

## 2024-11-27 ENCOUNTER — Other Ambulatory Visit (HOSPITAL_COMMUNITY): Payer: Self-pay

## 2025-01-04 ENCOUNTER — Ambulatory Visit: Admitting: Internal Medicine

## 2025-01-05 ENCOUNTER — Ambulatory Visit

## 2025-03-04 ENCOUNTER — Ambulatory Visit: Admitting: Neurology
